# Patient Record
Sex: Male | Born: 1975 | Race: Black or African American | Hispanic: No | Marital: Single | Smoking: Former smoker
Health system: Southern US, Community
[De-identification: ages and names within clinical notes are randomized; demographics above are authoritative.]

## PROBLEM LIST (undated history)

## (undated) DIAGNOSIS — K219 Gastro-esophageal reflux disease without esophagitis: Secondary | ICD-10-CM

## (undated) DIAGNOSIS — I10 Essential (primary) hypertension: Secondary | ICD-10-CM

## (undated) DIAGNOSIS — E1300 Other specified diabetes mellitus with hyperosmolarity without nonketotic hyperglycemic-hyperosmolar coma (NKHHC): Secondary | ICD-10-CM

## (undated) DIAGNOSIS — E1110 Type 2 diabetes mellitus with ketoacidosis without coma: Secondary | ICD-10-CM

## (undated) DIAGNOSIS — E785 Hyperlipidemia, unspecified: Secondary | ICD-10-CM

## (undated) DIAGNOSIS — E119 Type 2 diabetes mellitus without complications: Secondary | ICD-10-CM

## (undated) DIAGNOSIS — L0291 Cutaneous abscess, unspecified: Secondary | ICD-10-CM

## (undated) DIAGNOSIS — A4902 Methicillin resistant Staphylococcus aureus infection, unspecified site: Secondary | ICD-10-CM

## (undated) DIAGNOSIS — E1100 Type 2 diabetes mellitus with hyperosmolarity without nonketotic hyperglycemic-hyperosmolar coma (NKHHC): Secondary | ICD-10-CM

## (undated) HISTORY — DX: Type 2 diabetes mellitus without complications: E11.9

## (undated) HISTORY — DX: Hyperlipidemia, unspecified: E78.5

## (undated) HISTORY — DX: Other specified diabetes mellitus with hyperosmolarity without nonketotic hyperglycemic-hyperosmolar coma (NKHHC): E13.00

## (undated) HISTORY — PX: ANKLE SURGERY: SHX546

## (undated) HISTORY — DX: Type 2 diabetes mellitus with ketoacidosis without coma: E11.10

## (undated) HISTORY — PX: INCISION AND DRAINAGE PERIRECTAL ABSCESS: SHX1804

## (undated) HISTORY — PX: HAND SURGERY: SHX662

## (undated) HISTORY — DX: Gastro-esophageal reflux disease without esophagitis: K21.9

## (undated) HISTORY — DX: Type 2 diabetes mellitus with hyperosmolarity without nonketotic hyperglycemic-hyperosmolar coma (NKHHC): E11.00

---

## 2000-04-03 ENCOUNTER — Encounter: Payer: Self-pay | Admitting: Orthopedic Surgery

## 2000-04-03 ENCOUNTER — Emergency Department (HOSPITAL_COMMUNITY): Admission: EM | Admit: 2000-04-03 | Discharge: 2000-04-03 | Payer: Self-pay

## 2000-04-03 ENCOUNTER — Encounter: Payer: Self-pay | Admitting: Emergency Medicine

## 2001-03-08 ENCOUNTER — Emergency Department (HOSPITAL_COMMUNITY): Admission: EM | Admit: 2001-03-08 | Discharge: 2001-03-08 | Payer: Self-pay | Admitting: Podiatry

## 2002-02-02 ENCOUNTER — Encounter: Payer: Self-pay | Admitting: Emergency Medicine

## 2002-02-02 ENCOUNTER — Emergency Department (HOSPITAL_COMMUNITY): Admission: EM | Admit: 2002-02-02 | Discharge: 2002-02-02 | Payer: Self-pay | Admitting: Emergency Medicine

## 2002-06-02 ENCOUNTER — Emergency Department (HOSPITAL_COMMUNITY): Admission: EM | Admit: 2002-06-02 | Discharge: 2002-06-02 | Payer: Self-pay | Admitting: Emergency Medicine

## 2003-03-12 ENCOUNTER — Emergency Department (HOSPITAL_COMMUNITY): Admission: EM | Admit: 2003-03-12 | Discharge: 2003-03-12 | Payer: Self-pay | Admitting: *Deleted

## 2003-03-12 ENCOUNTER — Ambulatory Visit (HOSPITAL_BASED_OUTPATIENT_CLINIC_OR_DEPARTMENT_OTHER): Admission: RE | Admit: 2003-03-12 | Discharge: 2003-03-12 | Payer: Self-pay | Admitting: General Surgery

## 2003-05-01 ENCOUNTER — Emergency Department (HOSPITAL_COMMUNITY): Admission: EM | Admit: 2003-05-01 | Discharge: 2003-05-01 | Payer: Self-pay | Admitting: Emergency Medicine

## 2003-05-12 ENCOUNTER — Emergency Department (HOSPITAL_COMMUNITY): Admission: EM | Admit: 2003-05-12 | Discharge: 2003-05-12 | Payer: Self-pay | Admitting: Emergency Medicine

## 2003-05-14 ENCOUNTER — Encounter: Admission: RE | Admit: 2003-05-14 | Discharge: 2003-05-14 | Payer: Self-pay | Admitting: Internal Medicine

## 2003-05-26 ENCOUNTER — Emergency Department (HOSPITAL_COMMUNITY): Admission: EM | Admit: 2003-05-26 | Discharge: 2003-05-26 | Payer: Self-pay | Admitting: Emergency Medicine

## 2003-08-21 ENCOUNTER — Emergency Department (HOSPITAL_COMMUNITY): Admission: AD | Admit: 2003-08-21 | Discharge: 2003-08-21 | Payer: Self-pay | Admitting: Family Medicine

## 2003-08-24 ENCOUNTER — Encounter: Admission: RE | Admit: 2003-08-24 | Discharge: 2003-08-24 | Payer: Self-pay | Admitting: Internal Medicine

## 2003-11-23 ENCOUNTER — Emergency Department (HOSPITAL_COMMUNITY): Admission: EM | Admit: 2003-11-23 | Discharge: 2003-11-24 | Payer: Self-pay | Admitting: Emergency Medicine

## 2003-12-17 ENCOUNTER — Emergency Department (HOSPITAL_COMMUNITY): Admission: EM | Admit: 2003-12-17 | Discharge: 2003-12-17 | Payer: Self-pay | Admitting: Emergency Medicine

## 2004-03-07 ENCOUNTER — Emergency Department (HOSPITAL_COMMUNITY): Admission: EM | Admit: 2004-03-07 | Discharge: 2004-03-07 | Payer: Self-pay | Admitting: Emergency Medicine

## 2004-03-14 ENCOUNTER — Emergency Department (HOSPITAL_COMMUNITY): Admission: EM | Admit: 2004-03-14 | Discharge: 2004-03-14 | Payer: Self-pay | Admitting: *Deleted

## 2005-11-24 ENCOUNTER — Emergency Department (HOSPITAL_COMMUNITY): Admission: EM | Admit: 2005-11-24 | Discharge: 2005-11-24 | Payer: Self-pay | Admitting: Emergency Medicine

## 2006-09-20 ENCOUNTER — Emergency Department (HOSPITAL_COMMUNITY): Admission: EM | Admit: 2006-09-20 | Discharge: 2006-09-20 | Payer: Self-pay | Admitting: Emergency Medicine

## 2007-12-01 ENCOUNTER — Emergency Department (HOSPITAL_COMMUNITY): Admission: EM | Admit: 2007-12-01 | Discharge: 2007-12-01 | Payer: Self-pay | Admitting: Emergency Medicine

## 2009-04-22 ENCOUNTER — Emergency Department (HOSPITAL_COMMUNITY): Admission: EM | Admit: 2009-04-22 | Discharge: 2009-04-22 | Payer: Self-pay | Admitting: Family Medicine

## 2009-05-14 ENCOUNTER — Encounter: Admission: RE | Admit: 2009-05-14 | Discharge: 2009-05-21 | Payer: Self-pay | Admitting: Orthopedic Surgery

## 2009-06-06 ENCOUNTER — Emergency Department (HOSPITAL_COMMUNITY): Admission: EM | Admit: 2009-06-06 | Discharge: 2009-06-07 | Payer: Self-pay | Admitting: Emergency Medicine

## 2010-07-27 LAB — COMPREHENSIVE METABOLIC PANEL
ALT: 72 U/L — ABNORMAL HIGH (ref 0–53)
Alkaline Phosphatase: 90 U/L (ref 39–117)
BUN: 11 mg/dL (ref 6–23)
CO2: 26 mEq/L (ref 19–32)
Chloride: 103 mEq/L (ref 96–112)
GFR calc non Af Amer: >60 mL/min (ref >60)
Glucose, Bld: 116 mg/dL — ABNORMAL HIGH (ref 70–99)
Potassium: 4 mEq/L (ref 3.5–5.1)
Sodium: 138 mEq/L (ref 135–145)
Total Bilirubin: 1.4 mg/dL — ABNORMAL HIGH (ref 0.3–1.2)
Total Protein: 6.8 g/dL (ref 6.0–8.3)

## 2010-07-27 LAB — CBC
Hemoglobin: 16 g/dL (ref 13.0–17.0)
MCHC: 34 g/dL (ref 30.0–36.0)
RBC: 5.24 MIL/uL (ref 4.22–5.81)
WBC: 7.9 10*3/uL (ref 4.0–10.5)

## 2010-07-27 LAB — URINALYSIS, ROUTINE W REFLEX MICROSCOPIC
Bilirubin Urine: NEGATIVE
Nitrite: NEGATIVE
Specific Gravity, Urine: 1.029 (ref 1.005–1.030)
Urobilinogen, UA: 1 mg/dL (ref 0.0–1.0)
pH: 6 (ref 5.0–8.0)

## 2010-07-27 LAB — DIFFERENTIAL
Basophils Relative: 1 % (ref 0–1)
Eosinophils Absolute: 0.3 10*3/uL (ref 0.0–0.7)
Eosinophils Relative: 3 % (ref 0–5)
Lymphs Abs: 2.2 10*3/uL (ref 0.7–4.0)
Neutrophils Relative %: 60 % (ref 43–77)

## 2010-07-27 LAB — LIPASE, BLOOD: Lipase: 21 U/L (ref 11–59)

## 2010-07-29 ENCOUNTER — Inpatient Hospital Stay (INDEPENDENT_AMBULATORY_CARE_PROVIDER_SITE_OTHER)
Admission: RE | Admit: 2010-07-29 | Discharge: 2010-07-29 | Disposition: A | Discharge status: Home / Self Care | Payer: Self-pay | Source: Ambulatory Visit | Attending: Family Medicine | Admitting: Family Medicine

## 2010-07-29 DIAGNOSIS — M79609 Pain in unspecified limb: Secondary | ICD-10-CM

## 2010-07-29 DIAGNOSIS — S8000XA Contusion of unspecified knee, initial encounter: Secondary | ICD-10-CM

## 2010-07-29 DIAGNOSIS — M25469 Effusion, unspecified knee: Secondary | ICD-10-CM

## 2010-08-12 ENCOUNTER — Inpatient Hospital Stay (INDEPENDENT_AMBULATORY_CARE_PROVIDER_SITE_OTHER)
Admission: RE | Admit: 2010-08-12 | Discharge: 2010-08-12 | Disposition: A | Discharge status: Home / Self Care | Payer: Self-pay | Source: Ambulatory Visit

## 2010-08-12 DIAGNOSIS — R6889 Other general symptoms and signs: Secondary | ICD-10-CM

## 2010-09-26 NOTE — Consult Note (Signed)
Raymond Malone, Raymond Malone                          ACCOUNT NO.:  [id number]   MEDICAL RECORD NO.:  [id number]                   PATIENT TYPE:  EMS   LOCATION:  ED                                   FACILITY:  [facility]   PHYSICIAN:  Lorre Munroe., M.D.            DATE OF BIRTH:  June 17, 1975   DATE OF CONSULTATION:  05/12/2003  DATE OF DISCHARGE:                                   CONSULTATION   CHIEF COMPLAINT:  Anal pain.   HISTORY OF PRESENT ILLNESS:  The patient is a healthy 35 year old black male  with no history if GI disease but who underwent an incision and drainage of  perirectal abscess by Adolph Pollack, M.D. in November.  He has gotten  recurrent symptoms with severe anal pain and tenderness.  He has not felt  like he had a fever or chills and he does not note any drainage.  No  diarrhea, constipation or other generalized symptoms.   PAST MEDICAL HISTORY:  Excellent health.  No major operations.  Denies heart  and lung problems and other serious chronic problems.  Denies allergies.  He  asked for care of the perianal problem.   PHYSICAL EXAMINATION:  ABDOMEN:  Unremarkable.  BUTTOCKS:  The left buttock is swollen and tender with fluctuation at the  site of a scar.   IMPRESSION:  Perirectal abscess.   PROCEDURE:  Under a morphine analgesia and sedation and after a thorough  infiltration of local anesthetic in the skin, subcutaneous tissues, and  within the abscess cavity I did a radial incision and drainage with a great  deal of pus obtained.  Probing of the cavity disclosed no evidence of what  is likely to be a horseshoe fistula.  The cavity is packed after hemostasis  is obtained.  The patient is asked to return to the office in about 10 days  for a follow up check.  He is to change his bandage as necessary and keep a  bandage on it as long as there is any drainage or pus.                                               Lorre Munroe., M.D.    Jodi Marble  D:   05/12/2003  T:  05/12/2003  Job:  [id number]

## 2010-09-26 NOTE — Op Note (Signed)
   Raymond Malone, Raymond Malone                          ACCOUNT NO.:  [id number]   MEDICAL RECORD NO.:  [id number]                   PATIENT TYPE:  AMB   LOCATION:  DSC                                  FACILITY:  [facility]   PHYSICIAN:  Adolph Pollack, M.D.            DATE OF BIRTH:  1976-04-11   DATE OF PROCEDURE:  03/12/2003  DATE OF DISCHARGE:                                 OPERATIVE REPORT   PREOPERATIVE DIAGNOSIS:  Anorectal abscess.   POSTOPERATIVE DIAGNOSIS:  Anorectal abscess.   PROCEDURES:  Complex incision and drainage of anorectal abscess.   SURGEON:  Adolph Pollack, M.D.   ANESTHESIA:  General.   INDICATIONS:  Mr. Mccalla is a 35 year old male with progressively increasing  left perianal swelling and pain.  He presented to the emergency department  last night and was given a shot of ceftriaxone.  I saw him in the office  this morning.  He is now brought to [facility] for incision and  drainage.   TECHNIQUE:  He was seen in the holding area and then brought to the  operating room.  Placed supine on the operating table.  A general anesthetic  was administered.  He was placed in the lithotomy position.  The perianal  area was sterilely prepped and draped.  A fluctuant area was identified.  A  full-thickness triangular plug of skin and subcutaneous tissue was removed  with purulent material evacuated.  There were pockets tracking posteriorly  into the buttock region which were broken up and the pus drained out.  Once  adequate drainage had been performed, a 1/4 inch Penrose drain was stuck in  the cavity and angled to the subcutaneous tissue with 3-0 chromic suture.  I  then held direct pressure for hemostatic effects.  A digital rectal exam was  performed and no obvious fistulous track was noted.   A bulky dressing was then applied to the wound followed by tape.  He  tolerated the procedure well without any apparent complications and was  taken to the recovery room  in satisfactory condition.  He will be given  postoperative instructions.  He will also be placed on an antibiotic with  some surrounding cellulitis of the area.  Will see him back in the office in  two weeks.                                               Adolph Pollack, M.D.    Kari Baars  D:  03/12/2003  T:  03/12/2003  Job:  [id number]
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
## 2011-02-06 LAB — GLUCOSE, CAPILLARY: Glucose-Capillary: 160 — ABNORMAL HIGH

## 2011-05-19 ENCOUNTER — Emergency Department (INDEPENDENT_AMBULATORY_CARE_PROVIDER_SITE_OTHER)
Admission: EM | Admit: 2011-05-19 | Discharge: 2011-05-19 | Disposition: A | Discharge status: Home / Self Care | Payer: Self-pay | Source: Home / Self Care | Attending: Family Medicine | Admitting: Family Medicine

## 2011-05-19 ENCOUNTER — Encounter: Payer: Self-pay | Admitting: *Deleted

## 2011-05-19 DIAGNOSIS — H5789 Other specified disorders of eye and adnexa: Secondary | ICD-10-CM

## 2011-05-19 HISTORY — DX: Methicillin resistant Staphylococcus aureus infection, unspecified site: A49.02

## 2011-05-19 MED ORDER — TETRACAINE HCL 0.5 % OP SOLN
OPHTHALMIC | Status: AC
  Filled 2011-05-19: qty 2

## 2011-05-19 MED ORDER — PREDNISOLONE ACETATE 1 % OP SUSP: 1.0000 [drp] | Freq: Four times a day (QID) | OPHTHALMIC | Status: DC

## 2011-05-19 MED ORDER — SCOPOLAMINE HBR 0.25 % OP SOLN: 1.0000 [drp] | Freq: Two times a day (BID) | OPHTHALMIC | Status: AC | PRN

## 2011-05-19 MED ORDER — ERYTHROMYCIN 5 MG/GM OP OINT: TOPICAL_OINTMENT | OPHTHALMIC | Status: AC

## 2011-05-19 MED ORDER — SCOPOLAMINE HBR 0.25 % OP SOLN: 1.0000 [drp] | Freq: Four times a day (QID) | OPHTHALMIC | Status: DC

## 2011-05-19 NOTE — ED Provider Notes (Signed)
History     CSN: [id number]  Arrival date & time 05/19/11  1702   First MD Initiated Contact with Patient 05/19/11 1722      Chief Complaint  Patient presents with  . Conjunctivitis  . Ear Fullness    (Consider location/radiation/quality/duration/timing/severity/associated sxs/prior treatment) HPI Comments: 36 y/o male smoker otherwise with no significant PMH here c/o severe left eye discomfort first noticed 2 days ago. No contact lenses, no known trauma and no history of foreign body. Light bothers him the most. Feels better when room dark severe pain when light is turned on. Has blurry vision and redness in left eye, no itchiness, no drainage no sand like sensation. No fever or general malaise, no dysuria or joint pain.   Past Medical History  Diagnosis Date  . MRSA (methicillin resistant Staphylococcus aureus)     Past Surgical History  Procedure Date  . Ankle surgery   . Incision and drainage perirectal abscess     No family history on file.  History  Substance Use Topics  . Smoking status: Current Everyday Smoker -- 1.0 packs/day  . Smokeless tobacco: Not on file  . Alcohol Use: Yes     Occasional      Review of Systems  Constitutional: Negative for fever and chills.  HENT: Negative for congestion and rhinorrhea.   Eyes: Positive for photophobia, redness and visual disturbance.  Respiratory: Negative for cough.   Genitourinary: Negative for dysuria.  Musculoskeletal: Negative for joint swelling and arthralgias.  Neurological: Negative for headaches.    Allergies  Review of patient's allergies indicates no known allergies.  Home Medications   Current Outpatient Rx  Name Route Sig Dispense Refill  . ERYTHROMYCIN 5 MG/GM OP OINT  Place a 1/2 inch ribbon of ointment into the lower eyelid tid for 7 days 1 g 0  . PREDNISOLONE ACETATE 1 % OP SUSP Left Eye Place 1 drop into the left eye 4 (four) times daily. 5 mL 0  . SCOPOLAMINE HBR 0.25 % OP SOLN Left Eye  Place 1 drop into the left eye 4 (four) times daily. 5 mL 0    BP 156/100  Pulse 82  Temp(Src) 98.7 F (37.1 C) (Oral)  Resp 18  SpO2 99%  Physical Exam  Nursing note and vitals reviewed. Constitutional: He is oriented to person, place, and time. He appears well-developed and well-nourished.       Uncomfortable with eye closed.  HENT:  Head: Normocephalic and atraumatic.  Right Ear: External ear normal.  Left Ear: External ear normal.  Nose: Nose normal.  Mouth/Throat: Oropharynx is clear and moist. No oropharyngeal exudate.  Eyes: EOM are normal. Pupils are equal, round, and reactive to light. Right eye exhibits no discharge. Left eye exhibits no discharge. No scleral icterus.       Left eye moderate to severe conjunctival injection no significant pericilliar injection. Small pupil is round and reactive to light and appear symmetric compared with right side. Severe pain with pupil constriction in response to light or accomodation. No drainage or exudates, no blepharitis.  No periorbital edema or erythema. No fluorescein take no obvious foreign body or corneal abrasions or laceration.  Neck: Neck supple.  Cardiovascular: Normal heart sounds.   Pulmonary/Chest: Breath sounds normal.  Lymphadenopathy:    He has no cervical adenopathy.  Neurological: He is alert and oriented to person, place, and time.  Skin: No rash noted.    ED Course  Procedures (including critical care time)  Labs Reviewed -  No data to display No results found.   1. Eye redness       MDM  Pt. States he could not see an eye doctor as he does not have insurance or money. I discussed case with Dr. Burnice Logan (eye specialist on call) over the phone. He is willing to see patient in am no charge upfront. Scopolamine and erythromycin prescribed until recheck.       2  Sharin Grave, MD 05/20/11 1326

## 2011-05-19 NOTE — ED Notes (Signed)
On Sunday, had some photophobia.  Yesterday morning woke up with severe irritation and continuation of photophobia.  Denies any crusting or drainage.  Has used Clear Eyes gtts without relief.  Denies injury.  C/O blurred vision in left eye.  Does not wear glasses or contact lenses.  Also c/o "fullness" in right ear.

## 2012-04-13 ENCOUNTER — Emergency Department (INDEPENDENT_AMBULATORY_CARE_PROVIDER_SITE_OTHER)
Admission: EM | Admit: 2012-04-13 | Discharge: 2012-04-13 | Disposition: A | Discharge status: Home / Self Care | Payer: Self-pay | Source: Home / Self Care | Attending: Family Medicine | Admitting: Family Medicine

## 2012-04-13 ENCOUNTER — Encounter (HOSPITAL_COMMUNITY): Payer: Self-pay | Admitting: *Deleted

## 2012-04-13 DIAGNOSIS — M5430 Sciatica, unspecified side: Secondary | ICD-10-CM

## 2012-04-13 DIAGNOSIS — M5431 Sciatica, right side: Secondary | ICD-10-CM

## 2012-04-13 MED ORDER — OXYCODONE-ACETAMINOPHEN 5-325 MG PO TABS: 2.0000 | ORAL_TABLET | ORAL | Status: DC | PRN

## 2012-04-13 MED ORDER — IBUPROFEN 800 MG PO TABS: 800.0000 mg | ORAL_TABLET | Freq: Three times a day (TID) | ORAL | Status: DC

## 2012-04-13 MED ORDER — KETOROLAC TROMETHAMINE 60 MG/2ML IM SOLN
INTRAMUSCULAR | Status: AC
  Filled 2012-04-13: qty 2

## 2012-04-13 MED ORDER — KETOROLAC TROMETHAMINE 60 MG/2ML IM SOLN
60.0000 mg | Freq: Once | INTRAMUSCULAR | Status: AC
  Administered 2012-04-13: 60 mg via INTRAMUSCULAR

## 2012-04-13 MED ORDER — PREDNISONE 10 MG PO TABS: ORAL_TABLET | ORAL | Status: DC

## 2012-04-13 NOTE — ED Notes (Signed)
C/o severe lower back pain onset last Thursday.  No known injury.  States he lifted a TV on Wed. Night but no pain at that time.  Pain radiates down R leg to mid thigh.  Occasional tingling in his R foot.
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
## 2012-04-16 NOTE — ED Provider Notes (Signed)
Medical screening examination/treatment/procedure(s) were performed by resident physician or non-physician practitioner and as supervising physician I was immediately available for consultation/collaboration.   Arley Garant DOUGLAS MD.    Jaccob Czaplicki D Lawrence Mitch, MD 04/16/12 1931 

## 2012-10-19 ENCOUNTER — Emergency Department (HOSPITAL_COMMUNITY): Payer: Self-pay

## 2012-10-19 ENCOUNTER — Emergency Department (HOSPITAL_COMMUNITY)
Admission: EM | Admit: 2012-10-19 | Discharge: 2012-10-20 | Disposition: A | Discharge status: Home / Self Care | Payer: Self-pay | Attending: Emergency Medicine | Admitting: Emergency Medicine

## 2012-10-19 ENCOUNTER — Encounter (HOSPITAL_COMMUNITY): Payer: Self-pay | Admitting: *Deleted

## 2012-10-19 DIAGNOSIS — S61509A Unspecified open wound of unspecified wrist, initial encounter: Secondary | ICD-10-CM | POA: Insufficient documentation

## 2012-10-19 DIAGNOSIS — Y9229 Other specified public building as the place of occurrence of the external cause: Secondary | ICD-10-CM | POA: Insufficient documentation

## 2012-10-19 DIAGNOSIS — F17200 Nicotine dependence, unspecified, uncomplicated: Secondary | ICD-10-CM | POA: Insufficient documentation

## 2012-10-19 DIAGNOSIS — W260XXA Contact with knife, initial encounter: Secondary | ICD-10-CM | POA: Insufficient documentation

## 2012-10-19 DIAGNOSIS — S61512A Laceration without foreign body of left wrist, initial encounter: Secondary | ICD-10-CM

## 2012-10-19 DIAGNOSIS — Y9389 Activity, other specified: Secondary | ICD-10-CM | POA: Insufficient documentation

## 2012-10-19 DIAGNOSIS — Z8614 Personal history of Methicillin resistant Staphylococcus aureus infection: Secondary | ICD-10-CM | POA: Insufficient documentation

## 2012-10-19 DIAGNOSIS — Z23 Encounter for immunization: Secondary | ICD-10-CM | POA: Insufficient documentation

## 2012-10-19 DIAGNOSIS — M25532 Pain in left wrist: Secondary | ICD-10-CM

## 2012-10-19 MED ORDER — HYDROCODONE-ACETAMINOPHEN 5-325 MG PO TABS
2.0000 | ORAL_TABLET | Freq: Once | ORAL | Status: AC
  Filled 2012-10-19: qty 1

## 2012-10-19 MED ORDER — CEPHALEXIN 500 MG PO CAPS: 500.0000 mg | ORAL_CAPSULE | Freq: Four times a day (QID) | ORAL | Status: DC

## 2012-10-19 MED ORDER — TETANUS-DIPHTH-ACELL PERTUSSIS 5-2.5-18.5 LF-MCG/0.5 IM SUSP
0.5000 mL | Freq: Once | INTRAMUSCULAR | Status: AC
  Administered 2012-10-19: 0.5 mL via INTRAMUSCULAR
  Filled 2012-10-19: qty 0.5

## 2012-10-19 MED ORDER — HYDROCODONE-ACETAMINOPHEN 5-325 MG PO TABS: 1.0000 | ORAL_TABLET | ORAL | Status: DC | PRN

## 2012-10-19 MED ORDER — HYDROCODONE-ACETAMINOPHEN 5-325 MG PO TABS
2.0000 | ORAL_TABLET | Freq: Once | ORAL | Status: AC
  Administered 2012-10-19: 2 via ORAL
  Filled 2012-10-19: qty 2
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
## 2012-10-19 NOTE — ED Provider Notes (Signed)
  Medical screening examination/treatment/procedure(s) were performed by non-physician practitioner and as supervising physician I was immediately available for consultation/collaboration.    Ayub Kirsh, MD 10/19/12 2353 

## 2012-10-19 NOTE — Progress Notes (Signed)
Orthopedic Tech Progress Note Patient Details:  Raymond Malone 1976/03/19 [id number]  Ortho Devices Type of Ortho Device: Velcro wrist splint   Haskell Flirt 10/19/2012, 11:42 PM

## 2012-10-19 NOTE — ED Notes (Signed)
Pt requesting food and drink. Pt told unable to have anything PO until seen and allowed by provider.
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
## 2012-11-26 ENCOUNTER — Encounter (HOSPITAL_COMMUNITY): Payer: Self-pay | Admitting: *Deleted

## 2012-11-26 ENCOUNTER — Emergency Department (HOSPITAL_COMMUNITY)
Admission: EM | Admit: 2012-11-26 | Discharge: 2012-11-26 | Disposition: A | Discharge status: Home / Self Care | Payer: Self-pay | Attending: Emergency Medicine | Admitting: Emergency Medicine

## 2012-11-26 ENCOUNTER — Emergency Department (HOSPITAL_COMMUNITY): Payer: Self-pay

## 2012-11-26 DIAGNOSIS — Y929 Unspecified place or not applicable: Secondary | ICD-10-CM | POA: Insufficient documentation

## 2012-11-26 DIAGNOSIS — S61209A Unspecified open wound of unspecified finger without damage to nail, initial encounter: Secondary | ICD-10-CM | POA: Insufficient documentation

## 2012-11-26 DIAGNOSIS — Z8614 Personal history of Methicillin resistant Staphylococcus aureus infection: Secondary | ICD-10-CM | POA: Insufficient documentation

## 2012-11-26 DIAGNOSIS — W261XXA Contact with sword or dagger, initial encounter: Secondary | ICD-10-CM | POA: Insufficient documentation

## 2012-11-26 DIAGNOSIS — IMO0002 Reserved for concepts with insufficient information to code with codable children: Secondary | ICD-10-CM

## 2012-11-26 DIAGNOSIS — Y9389 Activity, other specified: Secondary | ICD-10-CM | POA: Insufficient documentation

## 2012-11-26 DIAGNOSIS — F17200 Nicotine dependence, unspecified, uncomplicated: Secondary | ICD-10-CM | POA: Insufficient documentation

## 2012-11-26 DIAGNOSIS — Z9889 Other specified postprocedural states: Secondary | ICD-10-CM | POA: Insufficient documentation

## 2012-11-26 DIAGNOSIS — W260XXA Contact with knife, initial encounter: Secondary | ICD-10-CM | POA: Insufficient documentation

## 2012-11-26 LAB — CBC WITH DIFFERENTIAL/PLATELET
Basophils Relative: 0 % (ref 0–1)
Eosinophils Absolute: 0.1 10*3/uL (ref 0.0–0.7)
Eosinophils Relative: 1 % (ref 0–5)
Lymphs Abs: 2.4 10*3/uL (ref 0.7–4.0)
MCH: 28.6 pg (ref 26.0–34.0)
MCHC: 34.3 g/dL (ref 30.0–36.0)
MCV: 83.4 fL (ref 78.0–100.0)
Monocytes Relative: 8 % (ref 3–12)
Platelets: 242 10*3/uL (ref 150–400)
RBC: 4.93 MIL/uL (ref 4.22–5.81)

## 2012-11-26 LAB — POCT I-STAT, CHEM 8
Creatinine, Ser: 1 mg/dL (ref 0.50–1.35)
HCT: 44 % (ref 39.0–52.0)
Hemoglobin: 15 g/dL (ref 13.0–17.0)
Potassium: 3.8 mEq/L (ref 3.5–5.1)
Sodium: 140 mEq/L (ref 135–145)

## 2012-11-26 MED ORDER — LIDOCAINE-EPINEPHRINE 2 %-1:100000 IJ SOLN
20.0000 mL | Freq: Once | INTRAMUSCULAR | Status: AC
  Administered 2012-11-26: 20 mL
  Filled 2012-11-26: qty 20

## 2012-11-26 MED ORDER — DEXTROSE 5 % IV SOLN
1.0000 g | Freq: Once | INTRAVENOUS | Status: DC
  Filled 2012-11-26: qty 10

## 2012-11-26 MED ORDER — BACITRACIN ZINC 500 UNIT/GM EX OINT
TOPICAL_OINTMENT | Freq: Once | CUTANEOUS | Status: AC
  Administered 2012-11-26: 11:00:00 via TOPICAL
  Filled 2012-11-26: qty 15

## 2012-11-26 MED ORDER — LIDOCAINE-EPINEPHRINE 1 %-1:100000 IJ SOLN: 10.0000 mL | Freq: Once | INTRAMUSCULAR | Status: DC

## 2012-11-26 MED ORDER — DEXTROSE 5 % IV SOLN
1.0000 g | Freq: Once | INTRAVENOUS | Status: AC
  Administered 2012-11-26: 1 g via INTRAVENOUS

## 2012-11-26 MED ORDER — CEPHALEXIN 500 MG PO CAPS: 500.0000 mg | ORAL_CAPSULE | Freq: Two times a day (BID) | ORAL | Status: DC

## 2012-11-26 MED ORDER — CEFTRIAXONE SODIUM 1 G IJ SOLR: 1.0000 g | Freq: Once | INTRAMUSCULAR | Status: DC

## 2012-11-26 MED ORDER — OXYCODONE-ACETAMINOPHEN 5-325 MG PO TABS
2.0000 | ORAL_TABLET | Freq: Once | ORAL | Status: AC
  Administered 2012-11-26: 2 via ORAL
  Filled 2012-11-26: qty 2

## 2012-11-26 MED ORDER — OXYCODONE-ACETAMINOPHEN 5-325 MG PO TABS: 1.0000 | ORAL_TABLET | Freq: Three times a day (TID) | ORAL | Status: DC | PRN
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
## 2012-11-26 NOTE — ED Notes (Signed)
MD at bedside. 

## 2012-11-26 NOTE — Progress Notes (Signed)
Spoke with Patient at bedside.Case manager role explained.Patient provided with education on the [employer] four dollar plan for his Antibiotic medication.Patient reports he does have the funds  To pay for this plan.Patient does not have a PCP.Patient provided with a resource sheet for the [facility] [place] [facility] on [address].Patient verbalized full understanding of verbal /written Education-resources.No further case manager needs.
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
## 2012-11-26 NOTE — ED Notes (Signed)
Dressing removed by Dr. Mora Bellman.
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
## 2012-11-26 NOTE — ED Notes (Signed)
Pt requesting to speak to Child psychotherapist. Delice Bison- Child psychotherapist made aware sts will speak to pt.

## 2012-11-26 NOTE — ED Notes (Signed)
Pt sts wants social worker consult because unable to pay for medications. Dr. Mora Bellman made aware. sts will speak to pt.

## 2012-11-26 NOTE — ED Notes (Signed)
Dr Miller at bedside. 

## 2012-12-03 ENCOUNTER — Encounter (HOSPITAL_COMMUNITY): Payer: Self-pay | Admitting: *Deleted

## 2012-12-03 ENCOUNTER — Emergency Department (HOSPITAL_COMMUNITY)
Admission: EM | Admit: 2012-12-03 | Discharge: 2012-12-03 | Disposition: A | Discharge status: Home / Self Care | Payer: Self-pay | Attending: Emergency Medicine | Admitting: Emergency Medicine

## 2012-12-03 DIAGNOSIS — Z8614 Personal history of Methicillin resistant Staphylococcus aureus infection: Secondary | ICD-10-CM | POA: Insufficient documentation

## 2012-12-03 DIAGNOSIS — Z4802 Encounter for removal of sutures: Secondary | ICD-10-CM | POA: Insufficient documentation

## 2012-12-03 DIAGNOSIS — F17200 Nicotine dependence, unspecified, uncomplicated: Secondary | ICD-10-CM | POA: Insufficient documentation

## 2012-12-03 DIAGNOSIS — Z792 Long term (current) use of antibiotics: Secondary | ICD-10-CM | POA: Insufficient documentation
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
## 2012-12-03 NOTE — ED Notes (Signed)
Here for suture removal to left middle finger, no complaints. No signs of infection.

## 2012-12-03 NOTE — ED Notes (Signed)
Removed 11 sutures from left hand

## 2012-12-03 NOTE — ED Provider Notes (Signed)
Medical screening examination/treatment/procedure(s) were performed by non-physician practitioner and as supervising physician I was immediately available for consultation/collaboration.   Colleen Donahoe, MD 12/03/12 1459 

## 2013-06-21 ENCOUNTER — Emergency Department (HOSPITAL_COMMUNITY)
Admission: EM | Admit: 2013-06-21 | Discharge: 2013-06-21 | Disposition: A | Discharge status: Home / Self Care | Payer: Self-pay | Attending: Emergency Medicine | Admitting: Emergency Medicine

## 2013-06-21 ENCOUNTER — Encounter (HOSPITAL_COMMUNITY): Payer: Self-pay | Admitting: Emergency Medicine

## 2013-06-21 DIAGNOSIS — Z792 Long term (current) use of antibiotics: Secondary | ICD-10-CM | POA: Insufficient documentation

## 2013-06-21 DIAGNOSIS — L02219 Cutaneous abscess of trunk, unspecified: Secondary | ICD-10-CM | POA: Insufficient documentation

## 2013-06-21 DIAGNOSIS — L03319 Cellulitis of trunk, unspecified: Principal | ICD-10-CM

## 2013-06-21 DIAGNOSIS — L02214 Cutaneous abscess of groin: Secondary | ICD-10-CM

## 2013-06-21 DIAGNOSIS — F17200 Nicotine dependence, unspecified, uncomplicated: Secondary | ICD-10-CM | POA: Insufficient documentation

## 2013-06-21 DIAGNOSIS — Z8614 Personal history of Methicillin resistant Staphylococcus aureus infection: Secondary | ICD-10-CM | POA: Insufficient documentation

## 2013-06-21 MED ORDER — OXYCODONE-ACETAMINOPHEN 5-325 MG PO TABS
1.0000 | ORAL_TABLET | Freq: Once | ORAL | Status: AC
  Administered 2013-06-21: 1 via ORAL
  Filled 2013-06-21: qty 1

## 2013-06-21 NOTE — ED Notes (Signed)
Per patient pt has abscess to left groin, no drainage noted. Pt sts tender to touch and very painful. Pt has hx of abscess in various locations.

## 2013-06-21 NOTE — ED Provider Notes (Signed)
CSN: [id number]     Arrival date & time 06/21/13  1242 History  This chart was scribed for non-physician practitioner, Quincy Carnes, PA-C working with Alfonzo Feller, DO by Frederich Balding, ED scribe. This patient was seen in room TR11C/TR11C and the patient's care was started at 1:13 PM.   Chief Complaint  Patient presents with  . Abscess   The history is provided by the patient. No language interpreter was used.   HPI Comments: Raymond Malone is a 38 y.o. male who presents to the Emergency Department complaining of an abscess to his left groin area that he noticed 2 days ago. Denies drainage. He states it is very painful. Pt has a history of abscess under is arms, on his buttocks and on his face. Denies fever or chills.  Pt has hx of MRSA.  No intervention tried PTA.  Past Medical History  Diagnosis Date  . MRSA (methicillin resistant Staphylococcus aureus)    Past Surgical History  Procedure Laterality Date  . Ankle surgery    . Incision and drainage perirectal abscess    . Hand surgery      boxer's fracture L hand   No family history on file. History  Substance Use Topics  . Smoking status: Current Every Day Smoker -- 1.00 packs/day    Types: Cigarettes  . Smokeless tobacco: Not on file  . Alcohol Use: Yes     Comment: Occasional    Review of Systems  Constitutional: Negative for fever.  Skin:       Abscess.  All other systems reviewed and are negative.   Allergies  Review of patient's allergies indicates no known allergies.  Home Medications   Current Outpatient Rx  Name  Route  Sig  Dispense  Refill  . cephALEXin (KEFLEX) 500 MG capsule   Oral   Take 500 mg by mouth 2 (two) times daily.          BP 146/93  Pulse 83  Temp(Src) 97.7 F (36.5 C) (Oral)  Resp 19  Wt 230 lb (104.327 kg)  SpO2 95%  Physical Exam  Nursing note and vitals reviewed. Constitutional: He is oriented to person, place, and time. He appears well-developed and well-nourished.  No distress.  HENT:  Head: Normocephalic and atraumatic.  Mouth/Throat: Oropharynx is clear and moist.  Eyes: Conjunctivae and EOM are normal. Pupils are equal, round, and reactive to light.  Neck: Normal range of motion.  Cardiovascular: Normal rate, regular rhythm and normal heart sounds.   Pulmonary/Chest: Effort normal and breath sounds normal. No respiratory distress. He has no wheezes.  Genitourinary:  Small abscess to left groin. No active drainage.  Central fluctuance without surrounding erythema or evidence of cellulitis.   Musculoskeletal: Normal range of motion.  Neurological: He is alert and oriented to person, place, and time.  Skin: Skin is warm and dry. He is not diaphoretic.  Psychiatric: He has a normal mood and affect.    ED Course  Procedures (including critical care time)  DIAGNOSTIC STUDIES: Oxygen Saturation is 95% on RA, adequate by my interpretation.    COORDINATION OF CARE: 1:14 PM-Discussed treatment plan which includes I&D and pain medication with pt at bedside and pt agreed to plan.   INCISION AND DRAINAGE Performed by: Quincy Carnes, PA-C Consent: Verbal consent obtained. Risks and benefits: risks, benefits and alternatives were discussed Type: abscess  Body area: left groin  Anesthesia: local infiltration  Incision was made with a scalpel.  Local anesthetic: lidocaine  2% without epinephrine  Anesthetic total: 4 ml  Complexity: complex Blunt dissection to break up loculations  Drainage: purulent  Drainage amount: moderate  Packing material: none  Patient tolerance: Patient tolerated the procedure well with no immediate complications.   Labs Review Labs Reviewed - No data to display Imaging Review No results found.  EKG Interpretation   None       MDM   Final diagnoses:  Abscess of groin, left   I indeed performed as above, attempted to pack wound however pt states he could not tolerate any more manipulation of his  abscess. He is instructed to apply warm compresses to help aid drainage. He will followup with his primary care physician if problems occur. Discussed plan with patient, he agreed. Return precautions advised.  I personally performed the services described in this documentation, which was scribed in my presence. The recorded information has been reviewed and is accurate.  Larene Pickett, PA-C 06/21/13 [phone]

## 2013-06-21 NOTE — Discharge Instructions (Signed)
May apply warm compress to abscess to help aid continuing drainage. May take over the counter tylenol or motrin as needed for fever. Return to the ED for new or worsening sx.

## 2013-06-21 NOTE — ED Provider Notes (Signed)
Medical screening examination/treatment/procedure(s) were performed by non-physician practitioner and as supervising physician I was immediately available for consultation/collaboration.  EKG Interpretation   None         Alfonzo Feller, DO 06/21/13 1942

## 2014-03-28 ENCOUNTER — Encounter (HOSPITAL_COMMUNITY): Payer: Self-pay | Admitting: Emergency Medicine

## 2014-03-28 ENCOUNTER — Emergency Department (HOSPITAL_COMMUNITY)
Admission: EM | Admit: 2014-03-28 | Discharge: 2014-03-28 | Disposition: A | Discharge status: Home / Self Care | Payer: Medicaid Other | Attending: Emergency Medicine | Admitting: Emergency Medicine

## 2014-03-28 DIAGNOSIS — Z720 Tobacco use: Secondary | ICD-10-CM | POA: Insufficient documentation

## 2014-03-28 DIAGNOSIS — R21 Rash and other nonspecific skin eruption: Secondary | ICD-10-CM | POA: Insufficient documentation

## 2014-03-28 DIAGNOSIS — Z79899 Other long term (current) drug therapy: Secondary | ICD-10-CM | POA: Diagnosis not present

## 2014-03-28 DIAGNOSIS — Z8614 Personal history of Methicillin resistant Staphylococcus aureus infection: Secondary | ICD-10-CM | POA: Insufficient documentation

## 2014-03-28 DIAGNOSIS — Z792 Long term (current) use of antibiotics: Secondary | ICD-10-CM | POA: Insufficient documentation

## 2014-03-28 LAB — BASIC METABOLIC PANEL
ANION GAP: 9 (ref 5–15)
BUN: 9 mg/dL (ref 6–23)
CALCIUM: 9.2 mg/dL (ref 8.4–10.5)
CO2: 24 mEq/L (ref 19–32)
CREATININE: 1.01 mg/dL (ref 0.50–1.35)
Chloride: 105 mEq/L (ref 96–112)
Glucose, Bld: 107 mg/dL — ABNORMAL HIGH (ref 70–99)
Potassium: 4.8 mEq/L (ref 3.7–5.3)
SODIUM: 138 meq/L (ref 137–147)

## 2014-03-28 LAB — CBC WITH DIFFERENTIAL/PLATELET
BASOS ABS: 0.1 10*3/uL (ref 0.0–0.1)
BASOS PCT: 1 % (ref 0–1)
EOS ABS: 0.6 10*3/uL (ref 0.0–0.7)
EOS PCT: 7 % — AB (ref 0–5)
HEMATOCRIT: 46.8 % (ref 39.0–52.0)
Hemoglobin: 15.9 g/dL (ref 13.0–17.0)
LYMPHS PCT: 24 % (ref 12–46)
Lymphs Abs: 2.1 10*3/uL (ref 0.7–4.0)
MCH: 29.2 pg (ref 26.0–34.0)
MCHC: 34 g/dL (ref 30.0–36.0)
MCV: 86 fL (ref 78.0–100.0)
MONO ABS: 0.7 10*3/uL (ref 0.1–1.0)
Monocytes Relative: 8 % (ref 3–12)
Neutro Abs: 5.2 10*3/uL (ref 1.7–7.7)
Neutrophils Relative %: 60 % (ref 43–77)
PLATELETS: 322 10*3/uL (ref 150–400)
RBC: 5.44 MIL/uL (ref 4.22–5.81)
RDW: 13.1 % (ref 11.5–15.5)
WBC: 8.7 10*3/uL (ref 4.0–10.5)

## 2014-03-28 LAB — RPR

## 2014-03-28 LAB — HIV ANTIBODY (ROUTINE TESTING W REFLEX): HIV: NONREACTIVE

## 2014-03-28 MED ORDER — MUPIROCIN CALCIUM 2 % EX CREA
TOPICAL_CREAM | Freq: Once | CUTANEOUS | Status: AC
  Administered 2014-03-28: 14:00:00 via TOPICAL
  Filled 2014-03-28: qty 15

## 2014-03-28 MED ORDER — SULFAMETHOXAZOLE-TRIMETHOPRIM 800-160 MG PO TABS: 1.0000 | ORAL_TABLET | Freq: Two times a day (BID) | ORAL | Status: DC

## 2014-03-28 MED ORDER — HYDROCODONE-ACETAMINOPHEN 5-325 MG PO TABS
1.0000 | ORAL_TABLET | Freq: Once | ORAL | Status: AC
  Administered 2014-03-28: 1 via ORAL
  Filled 2014-03-28: qty 1

## 2014-03-28 MED ORDER — HYDROCODONE-ACETAMINOPHEN 5-325 MG PO TABS: ORAL_TABLET | ORAL | Status: DC

## 2014-03-28 MED ORDER — SULFAMETHOXAZOLE-TRIMETHOPRIM 800-160 MG PO TABS
1.0000 | ORAL_TABLET | Freq: Once | ORAL | Status: AC
  Administered 2014-03-28: 1 via ORAL
  Filled 2014-03-28: qty 1

## 2014-03-28 MED ORDER — MUPIROCIN 2 % EX OINT: 1.0000 "application " | TOPICAL_OINTMENT | Freq: Two times a day (BID) | CUTANEOUS | Status: DC

## 2014-03-28 NOTE — ED Provider Notes (Signed)
CSN: [id number]     Arrival date & time 03/28/14  1251 History  This chart was scribed for non-physician practitioner working with Francine Graven, DO by Molli Posey, ED Scribe. This patient was seen in room [facility] and the patient's care was started at 1:10 PM.   Chief Complaint  Patient presents with  . Rash   The history is provided by the patient. No language interpreter was used.   HPI Comments: Raymond Malone is a 38 y.o. male who presents to the Emergency Department complaining of a rash on his left hand and left foot that worsened 4 days ago. Pt reports associated pain to the areas. He denies any trauma or injury. Pt states he has been to ED multiple times in the past for similar symptoms for boils. He reports similar prior rash episodes but states that this episode is his worst. Pt states he took Abx several months ago for similar symptoms that reduced his rash. He reports using anti-fungal cream with no relief. He reports NKDA. He states his last STD screening was a long time ago.  Denies urethral discharge, fever, chills, nausea, vomiting, chest pain, shortness of breath.    Past Medical History  Diagnosis Date  . MRSA (methicillin resistant Staphylococcus aureus)    Past Surgical History  Procedure Laterality Date  . Ankle surgery    . Incision and drainage perirectal abscess    . Hand surgery      boxer's fracture L hand   No family history on file. History  Substance Use Topics  . Smoking status: Current Every Day Smoker -- 1.00 packs/day    Types: Cigarettes  . Smokeless tobacco: Not on file  . Alcohol Use: Yes     Comment: Occasional    Review of Systems 10 Systems reviewed and all are negative for acute change except as noted in the HPI.   Allergies  Review of patient's allergies indicates no known allergies.  Home Medications   Prior to Admission medications   Medication Sig Start Date End Date Taking? Authorizing Provider   HYDROcodone-acetaminophen (NORCO/VICODIN) 5-325 MG per tablet Take 1-2 tablets by mouth every 6 hours as needed for pain. 03/28/14   Doyal Saric, PA-C  metoprolol (LOPRESSOR) 50 MG tablet Take 50 mg by mouth 2 (two) times daily.    Historical Provider, MD  mupirocin ointment (BACTROBAN) 2 % Place 1 application into the nose 2 (two) times daily. 03/28/14   Yeva Bissette, PA-C  sulfamethoxazole-trimethoprim (BACTRIM DS) 800-160 MG per tablet Take 1 tablet by mouth 2 (two) times daily. 03/28/14   Lewayne Pauley, PA-C   BP 125/82 mmHg  Pulse 60  Temp(Src) 98.3 F (36.8 C) (Oral)  Resp 17  SpO2 98% Physical Exam  Constitutional: He is oriented to person, place, and time. He appears well-developed and well-nourished. No distress.  HENT:  Head: Normocephalic and atraumatic.  Mouth/Throat: Oropharynx is clear and moist.  Eyes: Conjunctivae and EOM are normal.  Neck: Normal range of motion.  Cardiovascular: Normal rate.   Pulmonary/Chest: Effort normal and breath sounds normal. No stridor.  Abdominal: Soft.  Musculoskeletal: Normal range of motion.  Neurological: He is alert and oriented to person, place, and time.  Skin: Rash noted.  Confluence ulcerated lesion is to bilateral feet and hands. No surrounding cellulitis or significant warmth. There is scant discharge. Lesions spare the mucous membranes   Psychiatric: He has a normal mood and affect.  Nursing note and vitals reviewed.  ED Course  Procedures  DIAGNOSTIC STUDIES: Oxygen Saturation is 99% on RA, normal by my interpretation.    COORDINATION OF CARE: 1:15 PM Discussed treatment plan with pt at bedside and pt agreed to plan.   Labs Review Labs Reviewed  CBC WITH DIFFERENTIAL - Abnormal; Notable for the following:    Eosinophils Relative 7 (*)    All other components within normal limits  BASIC METABOLIC PANEL - Abnormal; Notable for the following:    Glucose, Bld 107 (*)    All other  components within normal limits  GC/CHLAMYDIA PROBE AMP  RPR  HIV ANTIBODY (ROUTINE TESTING)    Imaging Review No results found.   EKG Interpretation None      MDM   Final diagnoses:  Rash   Filed Vitals:   03/28/14 1304 03/28/14 1455  BP: 123/87 125/82  Pulse: 62 60  Temp: 98.3 F (36.8 C)   TempSrc: Oral   Resp: 18 17  SpO2: 99% 98%    Medications  mupirocin cream (BACTROBAN) 2 % ( Topical Given 03/28/14 1428)  sulfamethoxazole-trimethoprim (BACTRIM DS,SEPTRA DS) 800-160 MG per tablet 1 tablet (1 tablet Oral Given 03/28/14 1400)  HYDROcodone-acetaminophen (NORCO/VICODIN) 5-325 MG per tablet 1 tablet (1 tablet Oral Given 03/28/14 1400)    Raymond Malone is a 38 y.o. male presenting with Painful ulcerated rash to all 4 extremities. No systemic signs of infection. Patient will be started on Bactrim out of an abundance of caution considering his history of MRSA. I've advised the patient on wound care and will write prescription for Bactroban advised close follow-up with dermatology.  Discussed case with attending MD who agrees with plan and stability to d/c to home.    Evaluation does not show pathology that would require ongoing emergent intervention or inpatient treatment. Pt is hemodynamically stable and mentating appropriately. Discussed findings and plan with patient/guardian, who agrees with care plan. All questions answered. Return precautions discussed and outpatient follow up given.   Discharge Medication List as of 03/28/2014  2:38 PM    START taking these medications   Details  HYDROcodone-acetaminophen (NORCO/VICODIN) 5-325 MG per tablet Take 1-2 tablets by mouth every 6 hours as needed for pain., Print    mupirocin ointment (BACTROBAN) 2 % Place 1 application into the nose 2 (two) times daily., Starting 03/28/2014, Until Discontinued, Print    sulfamethoxazole-trimethoprim (BACTRIM DS) 800-160 MG per tablet Take 1 tablet by mouth 2 (two) times daily.,  Starting 03/28/2014, Until Discontinued, Print          I personally performed the services described in this documentation, which was scribed in my presence. The recorded information has been reviewed and is accurate.     Monico Blitz, PA-C 03/28/14 [place], DO 03/30/14 [phone]

## 2014-03-28 NOTE — Discharge Instructions (Signed)
Take your antibiotics as directed and to completion. You should never have any leftover antibiotics! Push fluids and stay well hydrated.   Wash the affected area with soap and water and apply a thin layer of topical antibiotic ointment. Do this every 12 hours.   Do not use rubbing alcohol or hydrogen peroxide.                        Look for signs of infection: if you see redness, if the area becomes warm, if pain increases sharply, there is discharge (pus), if red streaks appear or you develop fever or vomiting, RETURN immediately to the Emergency Department  for a recheck.   Do not hesitate to return to the emergency room for any new, worsening or concerning symptoms.  Please obtain primary care using resource guide below. But the minute you were seen in the emergency room and that they will need to obtain records for further outpatient management.   Rash A rash is a change in the color or texture of your skin. There are many different types of rashes. You may have other problems that accompany your rash. CAUSES   Infections.  Allergic reactions. This can include allergies to pets or foods.  Certain medicines.  Exposure to certain chemicals, soaps, or cosmetics.  Heat.  Exposure to poisonous plants.  Tumors, both cancerous and noncancerous. SYMPTOMS   Redness.  Scaly skin.  Itchy skin.  Dry or cracked skin.  Bumps.  Blisters.  Pain. DIAGNOSIS  Your caregiver may do a physical exam to determine what type of rash you have. A skin sample (biopsy) may be taken and examined under a microscope. TREATMENT  Treatment depends on the type of rash you have. Your caregiver may prescribe certain medicines. For serious conditions, you may need to see a skin doctor (dermatologist). HOME CARE INSTRUCTIONS   Avoid the substance that caused your rash.  Do not scratch your rash. This can cause infection.  You may take cool baths to help stop itching.  Only take  over-the-counter or prescription medicines as directed by your caregiver.  Keep all follow-up appointments as directed by your caregiver. SEEK IMMEDIATE MEDICAL CARE IF:  You have increasing pain, swelling, or redness.  You have a fever.  You have new or severe symptoms.  You have body aches, diarrhea, or vomiting.  Your rash is not better after 3 days. MAKE SURE YOU:  Understand these instructions.  Will watch your condition.  Will get help right away if you are not doing well or get worse. Document Released: 04/17/2002 Document Revised: 07/20/2011 Document Reviewed: 02/09/2011 [facility] Patient Information 2015 [place], [place]. This information is not intended to replace advice given to you by your health care provider. Make sure you discuss any questions you have with your health care provider.   Emergency Department Resource Guide 1) Find a Doctor and Pay Out of Pocket Although you won't have to find out who is covered by your insurance plan, it is a good idea to ask around and get recommendations. You will then need to call the office and see if the doctor you have chosen will accept you as a new patient and what types of options they offer for patients who are self-pay. Some doctors offer discounts or will set up payment plans for their patients who do not have insurance, but you will need to ask so you aren't surprised when you get to your appointment.  2) Contact Your  Local Health Department Not all health departments have doctors that can see patients for sick visits, but many do, so it is worth a call to see if yours does. If you don't know where your local health department is, you can check in your phone book. The CDC also has a tool to help you locate your state's health department, and many state websites also have listings of all of their local health departments.  3) Find a [facility] If your illness is not likely to be very severe or complicated, you may want to  try a walk in clinic. These are popping up all over the country in pharmacies, drugstores, and shopping centers. They're usually staffed by nurse practitioners or physician assistants that have been trained to treat common illnesses and complaints. They're usually fairly quick and inexpensive. However, if you have serious medical issues or chronic medical problems, these are probably not your best option.  No Primary Care Doctor: - Call Health Connect at  [phone] - they can help you locate a primary care doctor that  accepts your insurance, provides certain services, etc. - Physician Referral Service- [phone]  Chronic Pain Problems: Organization         Address  Phone   Notes  [facility]  [phone] Patients need to be referred by their primary care doctor.   Medication Assistance: Organization         Address  Phone   Notes  [facility] Medication [facility] [address] [place], [place] [address] [phone] --Must be a resident of [facility] -- Must have NO insurance coverage whatsoever (no Medicaid/ Medicare, etc.) -- The pt. MUST have a primary care doctor that directs their care regularly and follows them in the community   MedAssist  [phone]   [employer]  [phone]    Agencies that provide inexpensive medical care: Organization         Address  Phone   Notes  [place]  [phone]   [facility]    [phone]   [facility] [place], [place] [address] [phone]   [place] [address] [place] [phone]   Planned Parenthood    [phone]   [facility]    [phone]   [place] and [address], [place] Phone:  [phone], Fax:  [phone] Hours of Operation:  9 am - 6 pm, M-F.  Also accepts Medicaid/Medicare and self-pay.  [facility] for [address], [address], [place] Phone: [phone], Fax: [phone]. Hours of Operation:  8:30 am - 5:30 pm, M-F.  Also accepts Medicaid and self-pay.  [facility] [place] [address] [place] Phone: [phone]   [place], [place], [place] [phone], Ext. [phone] Mondays & Thursdays: 7-9 AM.  First 15 patients are seen on a first come, first serve basis.    [place] Providers:  Organization         Address  Phone   Notes  [facility] [address], [place] [phone] Also accepts self-pay patients.  [place], [place]  [phone]   [place], [address], [place] [phone])  [place] [address] [place] [phone]   Lucianne Lei [address], [place]   [phone] Only accepts [place] Access [place] patients after they have their name applied to their card.   Self-Pay (no insurance) in [facility]:  Organization         Address  Phone   Notes  Sickle Cell Patients, [facility] Internal Medicine [place] [phone]   [facility] Urgent Care [place] [phone]   [facility] [place]  [place], [place], [place] [phone]   [facility]/Dr. Osei-Bonsu  [address] [place] or [address], [place] [phone] Phone number for both [place] and [place] locations is the same.  Urgent Medical and [facility] [address] [place] [phone]   [facility] [address] [place] or [address] [phone]   [facility] [address], [place] [phone], phone; [phone], fax  Sees patients 1st and 3rd Saturday of every month.  Must not qualify for public or private insurance (i.e. Medicaid, Medicare, [place] Health Choice, Veterans' Benefits)  Household income should be no more than 200% of the poverty level The clinic cannot treat you if you are pregnant or think you are pregnant  Sexually transmitted diseases are not treated at the clinic.    Dental Care: Organization         Address  Phone  Notes  [facility] Department of [facility] [phone] Accepts children up to age 80 who are enrolled in [place] or [place]; pregnant women with a Medicaid card; and children who have applied for Medicaid or [place] Health Choice, but were declined, whose parents can pay a reduced fee at time of service.  [facility] Department of [facility]  [address], [place] [phone] Accepts children up to age 62 who are enrolled in [place] or [place]; pregnant women with a Medicaid card; and children who have applied for Medicaid or [place] Health Choice, but were declined, whose parents can pay a reduced fee at time of service.  [facility] Adult Dental Access PROGRAM  [place] [phone] Patients are seen by appointment only. Walk-ins are not accepted. [place] will see patients 60 years of age and older. Monday - Tuesday (8am-5pm) Most Wednesdays (8:30-5pm) $30 per visit, cash only  [facility] Adult Dental Access PROGRAM  [address], [facility] [phone] Patients are seen by appointment only. Walk-ins are not accepted. [place] will see patients 64 years of age and older. One Wednesday Evening (Monthly: Volunteer Based).  $30 per visit, cash only  [place]  [phone] for adults; Children under age 93, call Graduate Pediatric Dentistry at [phone]. Children aged 36-14, please call [phone] to  request a pediatric application.  Dental services are provided in all areas of dental care including fillings, crowns and bridges, complete and partial dentures, implants, gum treatment, root canals, and extractions. Preventive care is also provided. Treatment is provided to both adults and children. Patients are selected via a lottery and there is often a waiting list.   [facility] [address], [place]  [phone])  [phone] [email or web address]   [facility] [address] [place], [place] [phone], Ext. [phone] Second and Fourth Thursday of each month, opens at 6:30 AM; Clinic ends at 9 AM.  Patients are seen on a first-come first-served basis, and a limited number are seen during each clinic.   [facility]  [address] Hillard Danker [place], [place] [phone]   Eligibility Requirements You must have lived in [place], [place], or [place] counties for at least the last three months.   You cannot be eligible for state or federal sponsored [employer], including [employer], [place], or [employer].   You generally cannot be eligible for healthcare insurance through your employer.    How to apply: Eligibility screenings are held every Tuesday and Wednesday afternoon from 1:00 pm until 4:00 pm. You do not need an appointment for the interview!  [facility] [address], [place], [place]   [place]  [place] Department  [place]  [phone]    Behavioral Health Resources in the Community: Intensive Outpatient Programs Organization         Address  Phone  Notes  [place]. [address], [place], [place] [phone]   [facility] Outpatient [address], [place], [place]   ADS: Alcohol & Drug Svcs [address] [place], [place]   [place] [address],  [place], [place] or [phone]   Substance Abuse Resources Organization         Address  Phone  Notes  Alcohol and Drug Services  [phone]   [place]  [phone]   The [place]   Chinita Pester  [phone]   Residential & Outpatient Substance Abuse Program  [phone]   Psychological Services Organization         Address  Phone  Notes  [facility] [place]  [place]  [phone]   [place] [address], [place] or [phone]    Mobile Crisis Teams Organization         Address  Phone  Notes  [facility]atives, Mobile Crisis Care Unit  [phone]   Assertive Psychotherapeutic Services  [address] [place], [place]   [place] [address], [place] [phone]    Self-Help/Support Groups Organization         Address  Phone             Notes  [place]. of [place] - variety of support groups  [place] Call for more information  Narcotics Anonymous (NA), Caring [facility] [address], [employer] [place]  2 meetings at this location   Special educational needs teacher         Address  Phone  Notes  ASAP Residential Treatment [place],    [place]  [phone]   [facility]  [address] [place] [address], [place], [place]   [place], [place] [phone] Admissions: 8am-3pm M-F  Incentives Substance [place] [address],    [place], [place] [phone]   [facility] [address] Jadene Pierini [place], [place]   The [place].,  [place], [place]   Insight Programs - Intensive  Outpatient [address], [place], [place]   [facility] (Teller.) [place].,  [place], [place] [phone] or [phone]   Residential Treatment Services (RTS) [address] [place], [place] Accepts Medicaid  [facility] [place] [address]  [place] [phone] Substance Abuse/Addiction Treatment   [facility] Organization         Address  Phone  Notes  [facility]  [phone]   Domenic Schwab, PhD [address] Arlis Porta [place], [place]   [phone] or [phone]   Hendron Tovey [place], [place] [phone]   [place] [address], [place], [place] [phone] Insurance/Medicaid/sponsorship through [facility] and Families [address]                                    [place], [place] [phone] [place] [address], [place] [phone]    Dr. Adele Schilder  [phone]   [facility] [place] Dept. 1) [address] [place] 2) [place] 3)  [place] [address], [place] [phone]  [phone]   [place] [phone] or [phone] (After Hours)

## 2014-03-28 NOTE — ED Notes (Signed)
Pt c/o rash on left hand as well as on left foot and some on right foot.  Pt states that he has had boils and rash intermittently for couple years.

## 2014-03-29 LAB — GC/CHLAMYDIA PROBE AMP
CT Probe RNA: NEGATIVE
GC Probe RNA: NEGATIVE

## 2014-03-30 NOTE — Progress Notes (Signed)
  CARE MANAGEMENT ED NOTE 03/30/2014  Patient:  Raymond Malone, Raymond Malone   Account Number:  [id number]  Date Initiated:  03/30/2014  Documentation initiated by:  Jackelyn Poling  Subjective/Objective Assessment:   47 self pay [place] pt c/o rash on left hand as well as on left foot and some on right foot.  Pt states that he has had boils and rash intermittently for couple years.          Subjective/Objective Assessment Detail:   no pcp as confirmed by pt  He agreed to receive a referral to [facility] after Cm discussed the program  Pt reports he has bactroban and does not need more bactroban but need assist with bactrim & vicodin  [facility]rim for $4     Action/Plan:   ED CM present when pt transferred from [facility] flow manager to [place] requesting assistance with medications rx provided on 03/28/14 Cm returned call to pt [phone] Cm discussed list of   Action/Plan Detail:   Rx given on 03/28/14 for vicodin, bactricin & septra Discuss not being able to assist with vicodin via MATCH Discussed Septra 800/160 20 tabs cost at [place], [employer] for $4   Anticipated DC Date:  03/30/2014     Status Recommendation to Physician:   Result of Recommendation:    Other ED Services  Consult Working [place]  Other  Outpatient Services - Pt will follow up  PCP issues  Medication Assistance    Choice offered to / List presented to:            Status of service:  Completed, signed off  ED Comments:   ED Comments Detail:  CM discussed written information for self pay pcps, importance of pcp for f/u care, www.needymeds.org, discounted pharmacies and other [employer] such as [employer], [employer], affordable care act,  financial assistance, DSS and  health department  Reviewed resources for [employer] self pay pcps like Jinny [place], family medicine at [address], [facility] family practice, general medical clinics, [facility] urgent care plus others,  medication resources, [facility] out patient pharmacies and housing Pt voiced understanding and appreciation of resources provided  Provided [facility] contact information Referral to [facility] completed  Discussed match cost $3, [facility] does not have program to provide co pay funds Pt voiced understanding

## 2014-04-10 ENCOUNTER — Emergency Department (HOSPITAL_COMMUNITY)
Admission: EM | Admit: 2014-04-10 | Discharge: 2014-04-10 | Disposition: A | Discharge status: Home / Self Care | Payer: Medicaid Other | Attending: Emergency Medicine | Admitting: Emergency Medicine

## 2014-04-10 ENCOUNTER — Encounter (HOSPITAL_COMMUNITY): Payer: Self-pay | Admitting: *Deleted

## 2014-04-10 DIAGNOSIS — K6289 Other specified diseases of anus and rectum: Secondary | ICD-10-CM | POA: Diagnosis not present

## 2014-04-10 DIAGNOSIS — Z792 Long term (current) use of antibiotics: Secondary | ICD-10-CM | POA: Diagnosis not present

## 2014-04-10 DIAGNOSIS — R197 Diarrhea, unspecified: Secondary | ICD-10-CM | POA: Insufficient documentation

## 2014-04-10 DIAGNOSIS — Z720 Tobacco use: Secondary | ICD-10-CM | POA: Insufficient documentation

## 2014-04-10 DIAGNOSIS — Z8614 Personal history of Methicillin resistant Staphylococcus aureus infection: Secondary | ICD-10-CM | POA: Insufficient documentation

## 2014-04-10 DIAGNOSIS — L509 Urticaria, unspecified: Secondary | ICD-10-CM | POA: Diagnosis not present

## 2014-04-10 DIAGNOSIS — R21 Rash and other nonspecific skin eruption: Secondary | ICD-10-CM | POA: Diagnosis present

## 2014-04-10 DIAGNOSIS — Z79899 Other long term (current) drug therapy: Secondary | ICD-10-CM | POA: Insufficient documentation

## 2014-04-10 MED ORDER — HYDROCORTISONE 1 % EX CREA
1.0000 | TOPICAL_CREAM | Freq: Two times a day (BID) | CUTANEOUS | Status: DC
Start: 2014-04-10 — End: 2016-07-10

## 2014-04-10 MED ORDER — IBUPROFEN 400 MG PO TABS
400.0000 mg | ORAL_TABLET | Freq: Once | ORAL | Status: AC
Start: 2014-04-10 — End: 2014-04-10
  Administered 2014-04-10: 400 mg via ORAL
  Filled 2014-04-10: qty 1

## 2014-04-10 MED ORDER — DIPHENHYDRAMINE HCL 25 MG PO CAPS
50.0000 mg | ORAL_CAPSULE | Freq: Once | ORAL | Status: AC
  Administered 2014-04-10: 50 mg via ORAL
  Filled 2014-04-10: qty 2

## 2014-04-10 MED ORDER — DIPHENHYDRAMINE HCL 25 MG PO TABS: 25.0000 mg | ORAL_TABLET | Freq: Four times a day (QID) | ORAL | Status: DC | PRN

## 2014-04-10 MED ORDER — HYDROCORTISONE 2.5 % RE CREA: TOPICAL_CREAM | RECTAL | Status: DC

## 2014-04-10 NOTE — ED Notes (Signed)
Pt requesting pain medicine due to elevated pain level in rectum from rectal exam.

## 2014-04-10 NOTE — Discharge Instructions (Signed)
Please follow up with your primary care physician in 1-2 days. If you do not have one please call the [employer] number listed above. Please follow up with the gastroenterologist to schedule a follow up appointment.  Please use medications as prescribed. Please read all discharge instructions and return precautions.   Rash A rash is a change in the color or texture of your skin. There are many different types of rashes. You may have other problems that accompany your rash. CAUSES   Infections.  Allergic reactions. This can include allergies to pets or foods.  Certain medicines.  Exposure to certain chemicals, soaps, or cosmetics.  Heat.  Exposure to poisonous plants.  Tumors, both cancerous and noncancerous. SYMPTOMS   Redness.  Scaly skin.  Itchy skin.  Dry or cracked skin.  Bumps.  Blisters.  Pain. DIAGNOSIS  Your caregiver may do a physical exam to determine what type of rash you have. A skin sample (biopsy) may be taken and examined under a microscope. TREATMENT  Treatment depends on the type of rash you have. Your caregiver may prescribe certain medicines. For serious conditions, you may need to see a skin doctor (dermatologist). HOME CARE INSTRUCTIONS   Avoid the substance that caused your rash.  Do not scratch your rash. This can cause infection.  You may take cool baths to help stop itching.  Only take over-the-counter or prescription medicines as directed by your caregiver.  Keep all follow-up appointments as directed by your caregiver. SEEK IMMEDIATE MEDICAL CARE IF:  You have increasing pain, swelling, or redness.  You have a fever.  You have new or severe symptoms.  You have body aches, diarrhea, or vomiting.  Your rash is not better after 3 days. MAKE SURE YOU:  Understand these instructions.  Will watch your condition.  Will get help right away if you are not doing well or get worse. Document Released: 04/17/2002  Document Revised: 07/20/2011 Document Reviewed: 02/09/2011 [facility] Patient Information 2015 [place], [place]. This information is not intended to replace advice given to you by your health care provider. Make sure you discuss any questions you have with your health care provider.  Proctalgia Fugax Proctalgia fugax is a very short episode of intense rectal pain. It can last from seconds to minutes. It often occurs in the night, and awakens the person from sleep. It is not a sign of cancer.  CAUSES  The cause of this often intense rectal pain is not known. One possible cause may be spasm of the pelvic muscles or of the lowest part of the large intestine.  SYMPTOMS  The pain of proctalgia fugax:  Is intensely severe.  Lasts from only a few seconds to thirty minutes.  Usually awakens the person from sleep. DIAGNOSIS  In order to make sure that there are no other problems, diagnostic tests may be done such as:   Anoscopy. This is a lighted scope that is put into the rectum to look for abnormalities.  Barium enema. X-rays are taken after administering a radio-sensitive material. TREATMENT  A number of things have been used to try to treat this condition, including:  Medications.  Warm baths.  Relaxation techniques.  Gentle massage of the painful area. HOME CARE INSTRUCTIONS   Take all medications exactly as directed.  Follow any prescribed diet.  Follow instructions regarding both rest and physical activity.  Learn progressive relaxation techniques. SEEK IMMEDIATE MEDICAL CARE IF:   Your pain does not get better in the usual  amount of time.  You develop any new symptoms. Document Released: 01/20/2001 Document Revised: 07/20/2011 Document Reviewed: 06/28/2008 [facility] Patient Information 2015 [place], [place]. This information is not intended to replace advice given to you by your health care provider. Make sure you discuss any questions you have with your health care  provider.

## 2014-04-10 NOTE — ED Notes (Addendum)
X 2 days ago developed a rash all over; the rash did go away for one day; and now its back. Denies any changes in environment. Rectal pain - very intermittent.
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
## 2014-04-15 ENCOUNTER — Emergency Department (HOSPITAL_COMMUNITY)
Admission: EM | Admit: 2014-04-15 | Discharge: 2014-04-15 | Disposition: A | Discharge status: Home / Self Care | Payer: Medicaid Other | Attending: Emergency Medicine | Admitting: Emergency Medicine

## 2014-04-15 ENCOUNTER — Encounter (HOSPITAL_COMMUNITY): Payer: Self-pay

## 2014-04-15 DIAGNOSIS — Z8614 Personal history of Methicillin resistant Staphylococcus aureus infection: Secondary | ICD-10-CM | POA: Diagnosis not present

## 2014-04-15 DIAGNOSIS — Z792 Long term (current) use of antibiotics: Secondary | ICD-10-CM | POA: Diagnosis not present

## 2014-04-15 DIAGNOSIS — Z79899 Other long term (current) drug therapy: Secondary | ICD-10-CM | POA: Diagnosis not present

## 2014-04-15 DIAGNOSIS — Z720 Tobacco use: Secondary | ICD-10-CM | POA: Insufficient documentation

## 2014-04-15 DIAGNOSIS — L02411 Cutaneous abscess of right axilla: Secondary | ICD-10-CM | POA: Diagnosis not present

## 2014-04-15 MED ORDER — HYDROCODONE-ACETAMINOPHEN 5-325 MG PO TABS: ORAL_TABLET | ORAL | Status: DC

## 2014-04-15 MED ORDER — LIDOCAINE HCL 2 % IJ SOLN
10.0000 mL | Freq: Once | INTRAMUSCULAR | Status: AC
  Administered 2014-04-15: 10 mL
  Filled 2014-04-15: qty 20

## 2014-04-15 MED ORDER — SULFAMETHOXAZOLE-TRIMETHOPRIM 800-160 MG PO TABS: 1.0000 | ORAL_TABLET | Freq: Two times a day (BID) | ORAL | Status: DC

## 2014-04-15 NOTE — Discharge Instructions (Signed)

## 2014-04-15 NOTE — ED Notes (Signed)
Wound cleaned and dressing applied.
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
## 2014-04-15 NOTE — ED Notes (Signed)
Pt with abscess boil under rt arm pit.  Pain and swelling x 2 days

## 2014-06-10 ENCOUNTER — Emergency Department (HOSPITAL_COMMUNITY)
Admission: EM | Admit: 2014-06-10 | Discharge: 2014-06-10 | Disposition: A | Discharge status: Home / Self Care | Payer: Medicaid Other | Attending: Emergency Medicine | Admitting: Emergency Medicine

## 2014-06-10 ENCOUNTER — Encounter (HOSPITAL_COMMUNITY): Payer: Self-pay | Admitting: *Deleted

## 2014-06-10 DIAGNOSIS — L02214 Cutaneous abscess of groin: Secondary | ICD-10-CM | POA: Insufficient documentation

## 2014-06-10 DIAGNOSIS — Z7952 Long term (current) use of systemic steroids: Secondary | ICD-10-CM | POA: Diagnosis not present

## 2014-06-10 DIAGNOSIS — Z79899 Other long term (current) drug therapy: Secondary | ICD-10-CM | POA: Insufficient documentation

## 2014-06-10 DIAGNOSIS — Z720 Tobacco use: Secondary | ICD-10-CM | POA: Diagnosis not present

## 2014-06-10 DIAGNOSIS — Z8614 Personal history of Methicillin resistant Staphylococcus aureus infection: Secondary | ICD-10-CM | POA: Diagnosis not present

## 2014-06-10 DIAGNOSIS — Z792 Long term (current) use of antibiotics: Secondary | ICD-10-CM | POA: Diagnosis not present

## 2014-06-10 DIAGNOSIS — L0291 Cutaneous abscess, unspecified: Secondary | ICD-10-CM | POA: Insufficient documentation

## 2014-06-10 DIAGNOSIS — R208 Other disturbances of skin sensation: Secondary | ICD-10-CM | POA: Diagnosis present

## 2014-06-10 HISTORY — DX: Cutaneous abscess, unspecified: L02.91

## 2014-06-10 MED ORDER — OXYCODONE-ACETAMINOPHEN 5-325 MG PO TABS
1.0000 | ORAL_TABLET | Freq: Once | ORAL | Status: AC
  Administered 2014-06-10: 1 via ORAL
  Filled 2014-06-10: qty 1

## 2014-06-10 MED ORDER — LIDOCAINE-EPINEPHRINE (PF) 2 %-1:200000 IJ SOLN
10.0000 mL | Freq: Once | INTRAMUSCULAR | Status: AC
  Administered 2014-06-10: 10 mL
  Filled 2014-06-10: qty 20

## 2014-06-10 MED ORDER — HYDROCODONE-ACETAMINOPHEN 5-325 MG PO TABS: ORAL_TABLET | ORAL | Status: DC

## 2014-06-10 MED ORDER — SULFAMETHOXAZOLE-TRIMETHOPRIM 800-160 MG PO TABS: 1.0000 | ORAL_TABLET | Freq: Two times a day (BID) | ORAL | Status: DC

## 2014-06-10 MED ORDER — SODIUM BICARBONATE 4 % IV SOLN
5.0000 mL | Freq: Once | INTRAVENOUS | Status: AC
  Administered 2014-06-10: 5 mL via SUBCUTANEOUS
  Filled 2014-06-10: qty 5

## 2014-06-10 NOTE — ED Notes (Signed)
Declined W/C at D/C and was escorted to lobby by RN. 
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
## 2014-06-10 NOTE — ED Notes (Signed)
Pt reports a boil located in the LT groin region.

## 2014-06-10 NOTE — Discharge Instructions (Signed)
Please read and follow all provided instructions.  Your diagnoses today include:  1. Abscess of groin, left     Tests performed today include:  Vital signs. See below for your results today.   Medications prescribed:   Bactrim (trimethoprim/sulfamethoxazole) - antibiotic  You have been prescribed an antibiotic medicine: take the entire course of medicine even if you are feeling better. Stopping early can cause the antibiotic not to work.   Vicodin (hydrocodone/acetaminophen) - narcotic pain medication  DO NOT drive or perform any activities that require you to be awake and alert because this medicine can make you drowsy. BE VERY CAREFUL not to take multiple medicines containing Tylenol (also called acetaminophen). Doing so can lead to an overdose which can damage your liver and cause liver failure and possibly death.  Take any prescribed medications only as directed.   Home care instructions:   Follow any educational materials contained in this packet  Soak in warm water 3 times a day  Follow-up instructions: Return to the Emergency Department in 48 hours for a recheck if your symptoms are not significantly improved.  Please follow-up with your primary care provider in the next 1 week for further evaluation of your symptoms.   Return instructions:  Return to the Emergency Department if you have:  Fever  Worsening symptoms  Worsening pain  Worsening swelling  Redness of the skin that moves away from the affected area, especially if it streaks away from the affected area   Any other emergent concerns  Additional Information: If you have recurrent abscesses, try both the following. Use a Qtip to apply an over-the-counter antibiotic to the inside of your nostrils, twice a day for 5 days. Wash your body with over-the-counter Hibaclens once a day for one week and then once every two weeks. This can reduce the amount of bacterial on your skin that causes boils and lead to  fewer boils. If you continue to have multiple or recurrent boils, you should see the surgeon listened.    Your vital signs today were: BP 164/84 mmHg   Pulse 84   Temp(Src) 98.4 F (36.9 C) (Oral)   Resp 16   Ht 5' 11"  (1.803 m)   Wt 220 lb (99.791 kg)   BMI 30.70 kg/m2   SpO2 94% If your blood pressure (BP) was elevated above 135/85 this visit, please have this repeated by your doctor within one month. --------------

## 2015-01-24 ENCOUNTER — Encounter (HOSPITAL_COMMUNITY): Payer: Self-pay | Admitting: Emergency Medicine

## 2015-01-24 ENCOUNTER — Emergency Department (HOSPITAL_COMMUNITY)
Admission: EM | Admit: 2015-01-24 | Discharge: 2015-01-24 | Disposition: A | Discharge status: Home / Self Care | Payer: Medicaid Other | Attending: Emergency Medicine | Admitting: Emergency Medicine

## 2015-01-24 DIAGNOSIS — I10 Essential (primary) hypertension: Secondary | ICD-10-CM | POA: Insufficient documentation

## 2015-01-24 DIAGNOSIS — Z7952 Long term (current) use of systemic steroids: Secondary | ICD-10-CM | POA: Insufficient documentation

## 2015-01-24 DIAGNOSIS — Z720 Tobacco use: Secondary | ICD-10-CM | POA: Insufficient documentation

## 2015-01-24 DIAGNOSIS — Z872 Personal history of diseases of the skin and subcutaneous tissue: Secondary | ICD-10-CM | POA: Insufficient documentation

## 2015-01-24 DIAGNOSIS — Z8614 Personal history of Methicillin resistant Staphylococcus aureus infection: Secondary | ICD-10-CM | POA: Insufficient documentation

## 2015-01-24 DIAGNOSIS — Z79899 Other long term (current) drug therapy: Secondary | ICD-10-CM | POA: Insufficient documentation

## 2015-01-24 DIAGNOSIS — R42 Dizziness and giddiness: Secondary | ICD-10-CM | POA: Insufficient documentation

## 2015-01-24 DIAGNOSIS — Z792 Long term (current) use of antibiotics: Secondary | ICD-10-CM | POA: Insufficient documentation

## 2015-01-24 HISTORY — DX: Essential (primary) hypertension: I10

## 2015-01-24 LAB — URINALYSIS, ROUTINE W REFLEX MICROSCOPIC
Glucose, UA: NEGATIVE mg/dL
Hgb urine dipstick: NEGATIVE
KETONES UR: 15 mg/dL — AB
LEUKOCYTES UA: NEGATIVE
NITRITE: NEGATIVE
PH: 6 (ref 5.0–8.0)
PROTEIN: NEGATIVE mg/dL
Specific Gravity, Urine: 1.031 — ABNORMAL HIGH (ref 1.005–1.030)
Urobilinogen, UA: 1 mg/dL (ref 0.0–1.0)

## 2015-01-24 LAB — CBC
HCT: 46.7 % (ref 39.0–52.0)
Hemoglobin: 16.2 g/dL (ref 13.0–17.0)
MCH: 29.8 pg (ref 26.0–34.0)
MCHC: 34.7 g/dL (ref 30.0–36.0)
MCV: 86 fL (ref 78.0–100.0)
PLATELETS: 340 10*3/uL (ref 150–400)
RBC: 5.43 MIL/uL (ref 4.22–5.81)
RDW: 13 % (ref 11.5–15.5)
WBC: 7.7 10*3/uL (ref 4.0–10.5)

## 2015-01-24 LAB — BASIC METABOLIC PANEL
ANION GAP: 8 (ref 5–15)
BUN: 10 mg/dL (ref 6–20)
CALCIUM: 9.3 mg/dL (ref 8.9–10.3)
CO2: 25 mmol/L (ref 22–32)
CREATININE: 1.05 mg/dL (ref 0.61–1.24)
Chloride: 105 mmol/L (ref 101–111)
GFR calc Af Amer: >60 mL/min (ref >60)
Glucose, Bld: 120 mg/dL — ABNORMAL HIGH (ref 65–99)
Potassium: 3.9 mmol/L (ref 3.5–5.1)
Sodium: 138 mmol/L (ref 135–145)

## 2015-01-24 LAB — CBG MONITORING, ED: Glucose-Capillary: 127 mg/dL — ABNORMAL HIGH (ref 65–99)

## 2015-01-24 NOTE — Discharge Instructions (Signed)
You were evaluated in the ED today and there is not appear to be an emergent cause for your symptoms at this time. Your exam, labs were very reassuring. Please follow-up with your primary care doctor in 3 days for reevaluation. Return to ED for worsening symptoms.  Dizziness Dizziness is a common problem. It is a feeling of unsteadiness or light-headedness. You may feel like you are about to faint. Dizziness can lead to injury if you stumble or fall. A person of any age group can suffer from dizziness, but dizziness is more common in older adults. CAUSES  Dizziness can be caused by many different things, including:  Middle ear problems.  Standing for too long.  Infections.  An allergic reaction.  Aging.  An emotional response to something, such as the sight of blood.  Side effects of medicines.  Tiredness.  Problems with circulation or blood pressure.  Excessive use of alcohol or medicines, or illegal drug use.  Breathing too fast (hyperventilation).  An irregular heart rhythm (arrhythmia).  A low red blood cell count (anemia).  Pregnancy.  Vomiting, diarrhea, fever, or other illnesses that cause body fluid loss (dehydration).  Diseases or conditions such as Parkinson's disease, high blood pressure (hypertension), diabetes, and thyroid problems.  Exposure to extreme heat. DIAGNOSIS  Your health care provider will ask about your symptoms, perform a physical exam, and perform an electrocardiogram (ECG) to record the electrical activity of your heart. Your health care provider may also perform other heart or blood tests to determine the cause of your dizziness. These may include:  Transthoracic echocardiogram (TTE). During echocardiography, sound waves are used to evaluate how blood flows through your heart.  Transesophageal echocardiogram (TEE).  Cardiac monitoring. This allows your health care provider to monitor your heart rate and rhythm in real time.  Holter  monitor. This is a portable device that records your heartbeat and can help diagnose heart arrhythmias. It allows your health care provider to track your heart activity for several days if needed.  Stress tests by exercise or by giving medicine that makes the heart beat faster. TREATMENT  Treatment of dizziness depends on the cause of your symptoms and can vary greatly. HOME CARE INSTRUCTIONS   Drink enough fluids to keep your urine clear or pale yellow. This is especially important in very hot weather. In older adults, it is also important in cold weather.  Take your medicine exactly as directed if your dizziness is caused by medicines. When taking blood pressure medicines, it is especially important to get up slowly.  Rise slowly from chairs and steady yourself until you feel okay.  In the morning, first sit up on the side of the bed. When you feel okay, stand slowly while holding onto something until you know your balance is fine.  Move your legs often if you need to stand in one place for a long time. Tighten and relax your muscles in your legs while standing.  Have someone stay with you for 1-2 days if dizziness continues to be a problem. Do this until you feel you are well enough to stay alone. Have the person call your health care provider if he or she notices changes in you that are concerning.  Do not drive or use heavy machinery if you feel dizzy.  Do not drink alcohol. SEEK IMMEDIATE MEDICAL CARE IF:   Your dizziness or light-headedness gets worse.  You feel nauseous or vomit.  You have problems talking, walking, or using your arms, hands,  or legs.  You feel weak.  You are not thinking clearly or you have trouble forming sentences. It may take a friend or family member to notice this.  You have chest pain, abdominal pain, shortness of breath, or sweating.  Your vision changes.  You notice any bleeding.  You have side effects from medicine that seems to be getting  worse rather than better. MAKE SURE YOU:   Understand these instructions.  Will watch your condition.  Will get help right away if you are not doing well or get worse. Document Released: 10/21/2000 Document Revised: 05/02/2013 Document Reviewed: 11/14/2010 [facility] Patient Information 2015 [place], [place]. This information is not intended to replace advice given to you by your health care provider. Make sure you discuss any questions you have with your health care provider.
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
## 2015-01-24 NOTE — ED Notes (Signed)
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 States tried several times to return to work but continued to be lightheaded and dizzy. Pt awake, alert, oriented x4, pt ambulatory, no neuro deficits noted. VSS.
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
## 2015-02-20 ENCOUNTER — Emergency Department (INDEPENDENT_AMBULATORY_CARE_PROVIDER_SITE_OTHER)
Admission: EM | Admit: 2015-02-20 | Discharge: 2015-02-20 | Disposition: A | Discharge status: Home / Self Care | Payer: Self-pay | Source: Home / Self Care | Attending: Emergency Medicine | Admitting: Emergency Medicine

## 2015-02-20 ENCOUNTER — Encounter (HOSPITAL_COMMUNITY): Payer: Self-pay | Admitting: Emergency Medicine

## 2015-02-20 DIAGNOSIS — A084 Viral intestinal infection, unspecified: Secondary | ICD-10-CM

## 2015-02-20 MED ORDER — ONDANSETRON HCL 4 MG PO TABS: 4.0000 mg | ORAL_TABLET | Freq: Three times a day (TID) | ORAL | Status: DC | PRN
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
## 2015-02-20 NOTE — Discharge Instructions (Signed)
You have a stomach bug. These typically last 24-48 hours. Make sure you are drinking plenty of fluids. Use the Zofran every 8 hours as needed for nausea. Follow-up as needed.

## 2015-02-20 NOTE — ED Notes (Signed)
Pt has been suffering from abdominal cramping and diarrhea since this morning.  He denies any fever or vomiting.  His daughter is here with him today, suffering from abdominal cramping, as well.

## 2015-04-17 ENCOUNTER — Emergency Department (HOSPITAL_COMMUNITY)
Admission: EM | Admit: 2015-04-17 | Discharge: 2015-04-17 | Disposition: A | Discharge status: Home / Self Care | Payer: Self-pay | Attending: Emergency Medicine | Admitting: Emergency Medicine

## 2015-04-17 ENCOUNTER — Encounter (HOSPITAL_COMMUNITY): Payer: Self-pay | Admitting: Emergency Medicine

## 2015-04-17 DIAGNOSIS — Z8614 Personal history of Methicillin resistant Staphylococcus aureus infection: Secondary | ICD-10-CM | POA: Insufficient documentation

## 2015-04-17 DIAGNOSIS — Z792 Long term (current) use of antibiotics: Secondary | ICD-10-CM | POA: Insufficient documentation

## 2015-04-17 DIAGNOSIS — I10 Essential (primary) hypertension: Secondary | ICD-10-CM | POA: Insufficient documentation

## 2015-04-17 DIAGNOSIS — F17210 Nicotine dependence, cigarettes, uncomplicated: Secondary | ICD-10-CM | POA: Insufficient documentation

## 2015-04-17 DIAGNOSIS — L0291 Cutaneous abscess, unspecified: Secondary | ICD-10-CM

## 2015-04-17 DIAGNOSIS — H578 Other specified disorders of eye and adnexa: Secondary | ICD-10-CM | POA: Insufficient documentation

## 2015-04-17 DIAGNOSIS — Z79899 Other long term (current) drug therapy: Secondary | ICD-10-CM | POA: Insufficient documentation

## 2015-04-17 DIAGNOSIS — H6591 Unspecified nonsuppurative otitis media, right ear: Secondary | ICD-10-CM | POA: Insufficient documentation

## 2015-04-17 DIAGNOSIS — H6691 Otitis media, unspecified, right ear: Secondary | ICD-10-CM

## 2015-04-17 DIAGNOSIS — L02214 Cutaneous abscess of groin: Secondary | ICD-10-CM | POA: Insufficient documentation

## 2015-04-17 MED ORDER — LIDOCAINE HCL (PF) 1 % IJ SOLN
30.0000 mL | Freq: Once | INTRAMUSCULAR | Status: AC
  Administered 2015-04-17: 30 mL via INTRADERMAL
  Filled 2015-04-17: qty 30

## 2015-04-17 MED ORDER — HYDROCODONE-ACETAMINOPHEN 5-325 MG PO TABS
1.0000 | ORAL_TABLET | Freq: Once | ORAL | Status: AC
  Administered 2015-04-17: 1 via ORAL
  Filled 2015-04-17: qty 1

## 2015-04-17 MED ORDER — AMOXICILLIN 500 MG PO CAPS: 500.0000 mg | ORAL_CAPSULE | Freq: Two times a day (BID) | ORAL | Status: DC

## 2015-04-17 NOTE — ED Notes (Signed)
Pt states he has a abscess that came up on his right groin area yesterday. Pt also reports right ear pain and a fullness feeling in his ear.

## 2015-04-17 NOTE — ED Notes (Addendum)
Cart set up for abscess outside room, pr changed and ready.

## 2015-04-17 NOTE — ED Notes (Signed)
Pt c/o abscess that formed to left groin approximately 2 days ago.  Pt also complaining of "water in my ear" on right side x 2 days.

## 2015-04-17 NOTE — ED Notes (Addendum)
Pt called out from room, requesting something for pain. Barrett PA is aware.

## 2015-04-17 NOTE — Discharge Instructions (Signed)
Abscess An abscess is an infected area that contains a collection of pus and debris.It can occur in almost any part of the body. An abscess is also known as a furuncle or boil. CAUSES  An abscess occurs when tissue gets infected. This can occur from blockage of oil or sweat glands, infection of hair follicles, or a minor injury to the skin. As the body tries to fight the infection, pus collects in the area and creates pressure under the skin. This pressure causes pain. People with weakened immune systems have difficulty fighting infections and get certain abscesses more often.  SYMPTOMS Usually an abscess develops on the skin and becomes a painful mass that is red, warm, and tender. If the abscess forms under the skin, you may feel a moveable soft area under the skin. Some abscesses break open (rupture) on their own, but most will continue to get worse without care. The infection can spread deeper into the body and eventually into the bloodstream, causing you to feel ill.  DIAGNOSIS  Your caregiver will take your medical history and perform a physical exam. A sample of fluid may also be taken from the abscess to determine what is causing your infection. TREATMENT  Your caregiver may prescribe antibiotic medicines to fight the infection. However, taking antibiotics alone usually does not cure an abscess. Your caregiver may need to make a small cut (incision) in the abscess to drain the pus. In some cases, gauze is packed into the abscess to reduce pain and to continue draining the area. HOME CARE INSTRUCTIONS   Only take over-the-counter or prescription medicines for pain, discomfort, or fever as directed by your caregiver.  If you were prescribed antibiotics, take them as directed. Finish them even if you start to feel better.  If gauze is used, follow your caregiver's directions for changing the gauze.  To avoid spreading the infection:  Keep your draining abscess covered with a  bandage.  Wash your hands well.  Do not share personal care items, towels, or whirlpools with others.  Avoid skin contact with others.  Keep your skin and clothes clean around the abscess.  Keep all follow-up appointments as directed by your caregiver. SEEK MEDICAL CARE IF:   You have increased pain, swelling, redness, fluid drainage, or bleeding.  You have muscle aches, chills, or a general ill feeling.  You have a fever. MAKE SURE YOU:   Understand these instructions.  Will watch your condition.  Will get help right away if you are not doing well or get worse.   This information is not intended to replace advice given to you by your health care provider. Make sure you discuss any questions you have with your health care provider.   Document Released: 02/04/2005 Document Revised: 10/27/2011 Document Reviewed: 07/10/2011 Elsevier Interactive Patient Education 2016 [employer].  Otitis Media, Adult Otitis media is redness, soreness, and inflammation of the middle ear. Otitis media may be caused by allergies or, most commonly, by infection. Often it occurs as a complication of the common cold. SIGNS AND SYMPTOMS Symptoms of otitis media may include:  Earache.  Fever.  Ringing in your ear.  Headache.  Leakage of fluid from the ear. DIAGNOSIS To diagnose otitis media, your health care provider will examine your ear with an otoscope. This is an instrument that allows your health care provider to see into your ear in order to examine your eardrum. Your health care provider also will ask you questions about your symptoms. TREATMENT  Typically, otitis media resolves on its own within 3-5 days. Your health care provider may prescribe medicine to ease your symptoms of pain. If otitis media does not resolve within 5 days or is recurrent, your health care provider may prescribe antibiotic medicines if he or she suspects that a bacterial infection is the cause. HOME CARE  INSTRUCTIONS   If you were prescribed an antibiotic medicine, finish it all even if you start to feel better.  Take medicines only as directed by your health care provider.  Keep all follow-up visits as directed by your health care provider. SEEK MEDICAL CARE IF:  You have otitis media only in one ear, or bleeding from your nose, or both.  You notice a lump on your neck.  You are not getting better in 3-5 days.  You feel worse instead of better. SEEK IMMEDIATE MEDICAL CARE IF:   You have pain that is not controlled with medicine.  You have swelling, redness, or pain around your ear or stiffness in your neck.  You notice that part of your face is paralyzed.  You notice that the bone behind your ear (mastoid) is tender when you touch it. MAKE SURE YOU:   Understand these instructions.  Will watch your condition.  Will get help right away if you are not doing well or get worse.   This information is not intended to replace advice given to you by your health care provider. Make sure you discuss any questions you have with your health care provider.   Document Released: 01/31/2004 Document Revised: 05/18/2014 Document Reviewed: 11/22/2012 Elsevier Interactive Patient Education [employer].
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
## 2015-07-07 ENCOUNTER — Encounter (HOSPITAL_COMMUNITY): Payer: Self-pay | Admitting: Emergency Medicine

## 2015-07-07 ENCOUNTER — Emergency Department (HOSPITAL_COMMUNITY)
Admission: EM | Admit: 2015-07-07 | Discharge: 2015-07-07 | Disposition: A | Discharge status: Home / Self Care | Payer: Self-pay | Attending: Emergency Medicine | Admitting: Emergency Medicine

## 2015-07-07 DIAGNOSIS — F17210 Nicotine dependence, cigarettes, uncomplicated: Secondary | ICD-10-CM | POA: Insufficient documentation

## 2015-07-07 DIAGNOSIS — Z79899 Other long term (current) drug therapy: Secondary | ICD-10-CM | POA: Insufficient documentation

## 2015-07-07 DIAGNOSIS — Z8614 Personal history of Methicillin resistant Staphylococcus aureus infection: Secondary | ICD-10-CM | POA: Insufficient documentation

## 2015-07-07 DIAGNOSIS — R1084 Generalized abdominal pain: Secondary | ICD-10-CM | POA: Insufficient documentation

## 2015-07-07 DIAGNOSIS — Z872 Personal history of diseases of the skin and subcutaneous tissue: Secondary | ICD-10-CM | POA: Insufficient documentation

## 2015-07-07 DIAGNOSIS — I10 Essential (primary) hypertension: Secondary | ICD-10-CM | POA: Insufficient documentation

## 2015-07-07 LAB — COMPREHENSIVE METABOLIC PANEL
ALBUMIN: 3.5 g/dL (ref 3.5–5.0)
ALK PHOS: 72 U/L (ref 38–126)
ALT: 34 U/L (ref 17–63)
ANION GAP: 10 (ref 5–15)
AST: 26 U/L (ref 15–41)
BILIRUBIN TOTAL: 0.6 mg/dL (ref 0.3–1.2)
BUN: 13 mg/dL (ref 6–20)
CALCIUM: 9 mg/dL (ref 8.9–10.3)
CO2: 22 mmol/L (ref 22–32)
Chloride: 108 mmol/L (ref 101–111)
Creatinine, Ser: 1.18 mg/dL (ref 0.61–1.24)
GFR calc Af Amer: >60 mL/min (ref >60)
GFR calc non Af Amer: >60 mL/min (ref >60)
GLUCOSE: 99 mg/dL (ref 65–99)
Potassium: 4.3 mmol/L (ref 3.5–5.1)
SODIUM: 140 mmol/L (ref 135–145)
Total Protein: 6.2 g/dL — ABNORMAL LOW (ref 6.5–8.1)

## 2015-07-07 LAB — CBC
HEMATOCRIT: 47.2 % (ref 39.0–52.0)
HEMOGLOBIN: 16.4 g/dL (ref 13.0–17.0)
MCH: 30.1 pg (ref 26.0–34.0)
MCHC: 34.7 g/dL (ref 30.0–36.0)
MCV: 86.6 fL (ref 78.0–100.0)
Platelets: 303 10*3/uL (ref 150–400)
RBC: 5.45 MIL/uL (ref 4.22–5.81)
RDW: 13 % (ref 11.5–15.5)
WBC: 7.1 10*3/uL (ref 4.0–10.5)

## 2015-07-07 LAB — URINALYSIS, ROUTINE W REFLEX MICROSCOPIC
BILIRUBIN URINE: NEGATIVE
Glucose, UA: NEGATIVE mg/dL
HGB URINE DIPSTICK: NEGATIVE
KETONES UR: 15 mg/dL — AB
Leukocytes, UA: NEGATIVE
Nitrite: NEGATIVE
PH: 5.5 (ref 5.0–8.0)
Protein, ur: NEGATIVE mg/dL
SPECIFIC GRAVITY, URINE: 1.03 (ref 1.005–1.030)

## 2015-07-07 LAB — LIPASE, BLOOD: Lipase: 25 U/L (ref 11–51)

## 2015-07-07 MED ORDER — DICYCLOMINE HCL 20 MG PO TABS: 20.0000 mg | ORAL_TABLET | Freq: Two times a day (BID) | ORAL | Status: DC

## 2015-07-07 MED ORDER — ONDANSETRON HCL 4 MG PO TABS: 4.0000 mg | ORAL_TABLET | Freq: Four times a day (QID) | ORAL | Status: DC

## 2015-07-07 MED ORDER — DICYCLOMINE HCL 10 MG PO CAPS
10.0000 mg | ORAL_CAPSULE | Freq: Once | ORAL | Status: AC
  Administered 2015-07-07: 10 mg via ORAL
  Filled 2015-07-07: qty 1

## 2015-07-07 MED ORDER — ONDANSETRON HCL 4 MG PO TABS
4.0000 mg | ORAL_TABLET | Freq: Once | ORAL | Status: AC
  Administered 2015-07-07: 4 mg via ORAL
  Filled 2015-07-07: qty 1

## 2015-07-07 NOTE — Discharge Instructions (Signed)
[name],  Nice meeting you! Please follow-up with your primary care provider. Return to the emergency department if you develop increased abdominal pain, are unable to keep foods down. Feel better soon!  S. Wendie Simmer, PA-C   Abdominal Pain, Adult Many things can cause abdominal pain. Usually, abdominal pain is not caused by a disease and will improve without treatment. It can often be observed and treated at home. Your health care provider will do a physical exam and possibly order blood tests and X-rays to help determine the seriousness of your pain. However, in many cases, more time must pass before a clear cause of the pain can be found. Before that point, your health care provider may not know if you need more testing or further treatment. HOME CARE INSTRUCTIONS Monitor your abdominal pain for any changes. The following actions may help to alleviate any discomfort you are experiencing:  Only take over-the-counter or prescription medicines as directed by your health care provider.  Do not take laxatives unless directed to do so by your health care provider.  Try a clear liquid diet (broth, tea, or water) as directed by your health care provider. Slowly move to a bland diet as tolerated. SEEK MEDICAL CARE IF:  You have unexplained abdominal pain.  You have abdominal pain associated with nausea or diarrhea.  You have pain when you urinate or have a bowel movement.  You experience abdominal pain that wakes you in the night.  You have abdominal pain that is worsened or improved by eating food.  You have abdominal pain that is worsened with eating fatty foods.  You have a fever. SEEK IMMEDIATE MEDICAL CARE IF:  Your pain does not go away within 2 hours.  You keep throwing up (vomiting).  Your pain is felt only in portions of the abdomen, such as the right side or the left lower portion of the abdomen.  You pass bloody or black tarry stools. MAKE SURE  YOU:  Understand these instructions.  Will watch your condition.  Will get help right away if you are not doing well or get worse.   This information is not intended to replace advice given to you by your health care provider. Make sure you discuss any questions you have with your health care provider.   Document Released: 02/04/2005 Document Revised: 01/16/2015 Document Reviewed: 01/04/2013 Elsevier Interactive Patient Education [employer].

## 2015-07-07 NOTE — ED Notes (Signed)
Pt from home with c/o generalized abdominal pain with N/V/D starting yesterday.  Reports his daughter had the same "virus" recently.  Pt in NAD, A&O.
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
## 2015-07-31 ENCOUNTER — Emergency Department (HOSPITAL_COMMUNITY)
Admission: EM | Admit: 2015-07-31 | Discharge: 2015-07-31 | Disposition: A | Discharge status: Home / Self Care | Payer: Self-pay | Attending: Emergency Medicine | Admitting: Emergency Medicine

## 2015-07-31 ENCOUNTER — Encounter (HOSPITAL_COMMUNITY): Payer: Self-pay | Admitting: *Deleted

## 2015-07-31 DIAGNOSIS — I10 Essential (primary) hypertension: Secondary | ICD-10-CM | POA: Insufficient documentation

## 2015-07-31 DIAGNOSIS — F17210 Nicotine dependence, cigarettes, uncomplicated: Secondary | ICD-10-CM | POA: Insufficient documentation

## 2015-07-31 DIAGNOSIS — Z8614 Personal history of Methicillin resistant Staphylococcus aureus infection: Secondary | ICD-10-CM | POA: Insufficient documentation

## 2015-07-31 DIAGNOSIS — Z792 Long term (current) use of antibiotics: Secondary | ICD-10-CM | POA: Insufficient documentation

## 2015-07-31 DIAGNOSIS — Z7952 Long term (current) use of systemic steroids: Secondary | ICD-10-CM | POA: Insufficient documentation

## 2015-07-31 DIAGNOSIS — Z79899 Other long term (current) drug therapy: Secondary | ICD-10-CM | POA: Insufficient documentation

## 2015-07-31 DIAGNOSIS — L02214 Cutaneous abscess of groin: Secondary | ICD-10-CM | POA: Insufficient documentation

## 2015-07-31 MED ORDER — LIDOCAINE-EPINEPHRINE (PF) 2 %-1:200000 IJ SOLN
10.0000 mL | Freq: Once | INTRAMUSCULAR | Status: AC
  Administered 2015-07-31: 10 mL
  Filled 2015-07-31: qty 20

## 2015-07-31 MED ORDER — NAPROXEN 500 MG PO TABS: 500.0000 mg | ORAL_TABLET | Freq: Two times a day (BID) | ORAL | Status: DC

## 2015-07-31 MED ORDER — SULFAMETHOXAZOLE-TRIMETHOPRIM 800-160 MG PO TABS: 1.0000 | ORAL_TABLET | Freq: Two times a day (BID) | ORAL | Status: AC

## 2015-07-31 MED ORDER — HYDROCODONE-ACETAMINOPHEN 5-325 MG PO TABS: 1.0000 | ORAL_TABLET | ORAL | Status: DC | PRN

## 2015-07-31 MED ORDER — HYDROCODONE-ACETAMINOPHEN 5-325 MG PO TABS
1.0000 | ORAL_TABLET | Freq: Once | ORAL | Status: AC
  Administered 2015-07-31: 1 via ORAL
  Filled 2015-07-31: qty 1

## 2015-07-31 MED ORDER — ONDANSETRON 4 MG PO TBDP
4.0000 mg | ORAL_TABLET | Freq: Once | ORAL | Status: AC
  Administered 2015-07-31: 4 mg via ORAL
  Filled 2015-07-31: qty 1
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
## 2015-07-31 NOTE — ED Notes (Signed)
Pt is no where to be found. Pt did not inform staff he was leaving. Pt will be discharged from the ED without review of discharge instructions

## 2015-07-31 NOTE — Discharge Instructions (Signed)
Please call your primary care provider to schedule a follow up appointment as soon as possible. You will need to be seen by your PCP or in the ER or any urgent care in 48 hours for a wound check. In the meantime take your antibiotics as prescribed. I will also give you a couple prescriptions to help with your pain.   Incision and Drainage Incision and drainage is a procedure in which a sac-like structure (cystic structure) is opened and drained. The area to be drained usually contains material such as pus, fluid, or blood.  LET YOUR CAREGIVER KNOW ABOUT:   Allergies to medicine.  Medicines taken, including vitamins, herbs, eyedrops, over-the-counter medicines, and creams.  Use of steroids (by mouth or creams).  Previous problems with anesthetics or numbing medicines.  History of bleeding problems or blood clots.  Previous surgery.  Other health problems, including diabetes and kidney problems.  Possibility of pregnancy, if this applies. RISKS AND COMPLICATIONS  Pain.  Bleeding.  Scarring.  Infection. BEFORE THE PROCEDURE  You may need to have an ultrasound or other imaging tests to see how large or deep your cystic structure is. Blood tests may also be used to determine if you have an infection or how severe the infection is. You may need to have a tetanus shot. PROCEDURE  The affected area is cleaned with a cleaning fluid. The cyst area will then be numbed with a medicine (local anesthetic). A small incision will be made in the cystic structure. A syringe or catheter may be used to drain the contents of the cystic structure, or the contents may be squeezed out. The area will then be flushed with a cleansing solution. After cleansing the area, it is often gently packed with a gauze or another wound dressing. Once it is packed, it will be covered with gauze and tape or some other type of wound dressing. AFTER THE PROCEDURE   Often, you will be allowed to go home right after the  procedure.  You may be given antibiotic medicine to prevent or heal an infection.  If the area was packed with gauze or some other wound dressing, you will likely need to come back in 1 to 2 days to get it removed.  The area should heal in about 14 days.   This information is not intended to replace advice given to you by your health care provider. Make sure you discuss any questions you have with your health care provider.   Document Released: 10/21/2000 Document Revised: 10/27/2011 Document Reviewed: 06/22/2011 Elsevier Interactive Patient Education [employer].

## 2015-07-31 NOTE — ED Notes (Signed)
Pt not in room to review discharge instructions.

## 2015-07-31 NOTE — ED Notes (Signed)
Patient presents with c/o abcess that he noticed yesterday to the left lower abd

## 2016-03-10 ENCOUNTER — Encounter (HOSPITAL_COMMUNITY): Payer: Self-pay

## 2016-03-10 ENCOUNTER — Emergency Department (HOSPITAL_COMMUNITY)
Admission: EM | Admit: 2016-03-10 | Discharge: 2016-03-10 | Disposition: A | Discharge status: Home / Self Care | Payer: Medicaid Other | Attending: Emergency Medicine | Admitting: Emergency Medicine

## 2016-03-10 DIAGNOSIS — Z79899 Other long term (current) drug therapy: Secondary | ICD-10-CM | POA: Insufficient documentation

## 2016-03-10 DIAGNOSIS — Z791 Long term (current) use of non-steroidal anti-inflammatories (NSAID): Secondary | ICD-10-CM | POA: Diagnosis not present

## 2016-03-10 DIAGNOSIS — R112 Nausea with vomiting, unspecified: Secondary | ICD-10-CM | POA: Diagnosis present

## 2016-03-10 DIAGNOSIS — I10 Essential (primary) hypertension: Secondary | ICD-10-CM | POA: Diagnosis not present

## 2016-03-10 DIAGNOSIS — F17210 Nicotine dependence, cigarettes, uncomplicated: Secondary | ICD-10-CM | POA: Insufficient documentation

## 2016-03-10 DIAGNOSIS — B349 Viral infection, unspecified: Secondary | ICD-10-CM | POA: Diagnosis not present

## 2016-03-10 LAB — CBC WITH DIFFERENTIAL/PLATELET
BASOS ABS: 0.1 10*3/uL (ref 0.0–0.1)
BASOS PCT: 1 %
EOS ABS: 0.7 10*3/uL (ref 0.0–0.7)
EOS PCT: 9 %
HEMATOCRIT: 41.7 % (ref 39.0–52.0)
Hemoglobin: 14.8 g/dL (ref 13.0–17.0)
Lymphocytes Relative: 26 %
Lymphs Abs: 2.1 10*3/uL (ref 0.7–4.0)
MCH: 30.8 pg (ref 26.0–34.0)
MCHC: 35.5 g/dL (ref 30.0–36.0)
MCV: 86.7 fL (ref 78.0–100.0)
MONO ABS: 0.5 10*3/uL (ref 0.1–1.0)
Monocytes Relative: 6 %
NEUTROS ABS: 4.7 10*3/uL (ref 1.7–7.7)
Neutrophils Relative %: 58 %
PLATELETS: 307 10*3/uL (ref 150–400)
RBC: 4.81 MIL/uL (ref 4.22–5.81)
RDW: 12.6 % (ref 11.5–15.5)
WBC: 8.1 10*3/uL (ref 4.0–10.5)

## 2016-03-10 LAB — COMPREHENSIVE METABOLIC PANEL
ALBUMIN: 3.8 g/dL (ref 3.5–5.0)
ALT: 51 U/L (ref 17–63)
AST: 38 U/L (ref 15–41)
Alkaline Phosphatase: 81 U/L (ref 38–126)
Anion gap: 9 (ref 5–15)
BUN: 12 mg/dL (ref 6–20)
CHLORIDE: 103 mmol/L (ref 101–111)
CO2: 23 mmol/L (ref 22–32)
Calcium: 8.9 mg/dL (ref 8.9–10.3)
Creatinine, Ser: 1.1 mg/dL (ref 0.61–1.24)
GFR calc Af Amer: >60 mL/min (ref >60)
GFR calc non Af Amer: >60 mL/min (ref >60)
GLUCOSE: 196 mg/dL — AB (ref 65–99)
POTASSIUM: 3.5 mmol/L (ref 3.5–5.1)
SODIUM: 135 mmol/L (ref 135–145)
Total Bilirubin: 0.3 mg/dL (ref 0.3–1.2)
Total Protein: 6.5 g/dL (ref 6.5–8.1)

## 2016-03-10 MED ORDER — ONDANSETRON HCL 4 MG/2ML IJ SOLN
4.0000 mg | Freq: Once | INTRAMUSCULAR | Status: AC
  Administered 2016-03-10: 4 mg via INTRAVENOUS
  Filled 2016-03-10: qty 2

## 2016-03-10 MED ORDER — ONDANSETRON 4 MG PO TBDP: ORAL_TABLET | ORAL | 0 refills | Status: DC

## 2016-03-10 MED ORDER — IBUPROFEN 800 MG PO TABS: 800.0000 mg | ORAL_TABLET | Freq: Three times a day (TID) | ORAL | 0 refills | Status: DC | PRN

## 2016-03-10 MED ORDER — SODIUM CHLORIDE 0.9 % IV BOLUS (SEPSIS)
1000.0000 mL | Freq: Once | INTRAVENOUS | Status: AC
  Administered 2016-03-10: 1000 mL via INTRAVENOUS

## 2016-03-10 MED ORDER — KETOROLAC TROMETHAMINE 30 MG/ML IJ SOLN
30.0000 mg | Freq: Once | INTRAMUSCULAR | Status: AC
  Administered 2016-03-10: 30 mg via INTRAVENOUS
  Filled 2016-03-10: qty 1
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
## 2016-03-10 NOTE — ED Notes (Signed)
Went to take vitals nurse in room

## 2016-03-10 NOTE — ED Triage Notes (Signed)
Fever, chills, vomiting, and diarrhea that started Sunday.  Fever was 103 this morning.  Tylenol and Ibuprofen was given to him an hour ago per family.  Patient complaining of headache and muscle aches.

## 2016-03-10 NOTE — Discharge Instructions (Signed)
Drink plenty of fluids and follow-up with her family doctor in a couple days if not improving

## 2016-07-10 ENCOUNTER — Emergency Department (HOSPITAL_COMMUNITY): Payer: Medicaid Other

## 2016-07-10 ENCOUNTER — Emergency Department (HOSPITAL_COMMUNITY)
Admission: EM | Admit: 2016-07-10 | Discharge: 2016-07-10 | Disposition: A | Discharge status: Home / Self Care | Payer: Medicaid Other | Attending: Emergency Medicine | Admitting: Emergency Medicine

## 2016-07-10 ENCOUNTER — Encounter (HOSPITAL_COMMUNITY): Payer: Self-pay | Admitting: Emergency Medicine

## 2016-07-10 DIAGNOSIS — J111 Influenza due to unidentified influenza virus with other respiratory manifestations: Secondary | ICD-10-CM

## 2016-07-10 DIAGNOSIS — R5383 Other fatigue: Secondary | ICD-10-CM | POA: Insufficient documentation

## 2016-07-10 DIAGNOSIS — R509 Fever, unspecified: Secondary | ICD-10-CM | POA: Diagnosis not present

## 2016-07-10 DIAGNOSIS — R05 Cough: Secondary | ICD-10-CM | POA: Insufficient documentation

## 2016-07-10 DIAGNOSIS — Z79899 Other long term (current) drug therapy: Secondary | ICD-10-CM | POA: Diagnosis not present

## 2016-07-10 DIAGNOSIS — M791 Myalgia: Secondary | ICD-10-CM | POA: Diagnosis not present

## 2016-07-10 DIAGNOSIS — R69 Illness, unspecified: Secondary | ICD-10-CM

## 2016-07-10 DIAGNOSIS — I10 Essential (primary) hypertension: Secondary | ICD-10-CM | POA: Insufficient documentation

## 2016-07-10 DIAGNOSIS — J3489 Other specified disorders of nose and nasal sinuses: Secondary | ICD-10-CM | POA: Diagnosis not present

## 2016-07-10 DIAGNOSIS — J029 Acute pharyngitis, unspecified: Secondary | ICD-10-CM | POA: Insufficient documentation

## 2016-07-10 DIAGNOSIS — R0981 Nasal congestion: Secondary | ICD-10-CM | POA: Diagnosis not present

## 2016-07-10 DIAGNOSIS — F17210 Nicotine dependence, cigarettes, uncomplicated: Secondary | ICD-10-CM | POA: Insufficient documentation

## 2016-07-10 MED ORDER — ALBUTEROL SULFATE HFA 108 (90 BASE) MCG/ACT IN AERS
2.0000 | INHALATION_SPRAY | RESPIRATORY_TRACT | Status: DC | PRN
  Administered 2016-07-10: 2 via RESPIRATORY_TRACT
  Filled 2016-07-10: qty 6.7

## 2016-07-10 MED ORDER — ACETAMINOPHEN 325 MG PO TABS
650.0000 mg | ORAL_TABLET | Freq: Once | ORAL | Status: AC
  Administered 2016-07-10: 650 mg via ORAL
  Filled 2016-07-10: qty 2

## 2016-07-10 NOTE — ED Triage Notes (Addendum)
PT c/o congested productive green sputum cough, sore throat, body aches, nasal congestion with no OTC medication treatment x1 day.
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
## 2016-10-03 ENCOUNTER — Emergency Department (HOSPITAL_COMMUNITY)
Admission: EM | Admit: 2016-10-03 | Discharge: 2016-10-03 | Disposition: A | Discharge status: Home / Self Care | Payer: Medicaid Other | Attending: Emergency Medicine | Admitting: Emergency Medicine

## 2016-10-03 ENCOUNTER — Encounter (HOSPITAL_COMMUNITY): Payer: Self-pay | Admitting: Emergency Medicine

## 2016-10-03 DIAGNOSIS — F17210 Nicotine dependence, cigarettes, uncomplicated: Secondary | ICD-10-CM | POA: Insufficient documentation

## 2016-10-03 DIAGNOSIS — L02411 Cutaneous abscess of right axilla: Secondary | ICD-10-CM | POA: Diagnosis not present

## 2016-10-03 DIAGNOSIS — I10 Essential (primary) hypertension: Secondary | ICD-10-CM | POA: Insufficient documentation

## 2016-10-03 MED ORDER — IBUPROFEN 600 MG PO TABS: 600.0000 mg | ORAL_TABLET | Freq: Four times a day (QID) | ORAL | 0 refills | Status: DC

## 2016-10-03 MED ORDER — DOXYCYCLINE HYCLATE 100 MG PO TABS
100.0000 mg | ORAL_TABLET | Freq: Once | ORAL | Status: AC
  Administered 2016-10-03: 100 mg via ORAL
  Filled 2016-10-03: qty 1

## 2016-10-03 MED ORDER — TRAMADOL HCL 50 MG PO TABS: 50.0000 mg | ORAL_TABLET | Freq: Four times a day (QID) | ORAL | 0 refills | Status: DC | PRN

## 2016-10-03 MED ORDER — TRAMADOL HCL 50 MG PO TABS
100.0000 mg | ORAL_TABLET | Freq: Once | ORAL | Status: AC
  Administered 2016-10-03: 100 mg via ORAL
  Filled 2016-10-03: qty 2

## 2016-10-03 MED ORDER — IBUPROFEN 800 MG PO TABS
800.0000 mg | ORAL_TABLET | Freq: Once | ORAL | Status: AC
  Administered 2016-10-03: 800 mg via ORAL
  Filled 2016-10-03: qty 1

## 2016-10-03 MED ORDER — CEPHALEXIN 500 MG PO CAPS
500.0000 mg | ORAL_CAPSULE | Freq: Once | ORAL | Status: AC
  Administered 2016-10-03: 500 mg via ORAL
  Filled 2016-10-03: qty 1

## 2016-10-03 MED ORDER — DOXYCYCLINE HYCLATE 100 MG PO CAPS: 100.0000 mg | ORAL_CAPSULE | Freq: Two times a day (BID) | ORAL | 0 refills | Status: DC

## 2016-10-03 MED ORDER — CEPHALEXIN 500 MG PO CAPS: 500.0000 mg | ORAL_CAPSULE | Freq: Four times a day (QID) | ORAL | 0 refills | Status: DC

## 2016-10-03 MED ORDER — ONDANSETRON HCL 4 MG PO TABS
4.0000 mg | ORAL_TABLET | Freq: Once | ORAL | Status: AC
  Administered 2016-10-03: 4 mg via ORAL
  Filled 2016-10-03: qty 1
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
## 2016-10-03 NOTE — Discharge Instructions (Signed)
Please soak the 2 abscess areas of your right under arm in warm salt water for about 15 minutes daily. Use Keflex and ibuprofen 4 times daily with each meal and at bedtime. Use doxycycline 2 times daily with a meal. Use Ultram for more severe pain.This medication may cause drowsiness. Please do not drink, drive, or participate in activity that requires concentration while taking this medication.  Please see your Medicaid access physician, or return to the emergency department if your condition worsens or any changes or problem.

## 2016-10-03 NOTE — ED Triage Notes (Signed)
abscess in rt axilla started yesterday

## 2016-10-06 ENCOUNTER — Encounter (HOSPITAL_COMMUNITY): Payer: Self-pay | Admitting: Emergency Medicine

## 2016-10-06 ENCOUNTER — Inpatient Hospital Stay (HOSPITAL_COMMUNITY)
Admission: EM | Admit: 2016-10-06 | Discharge: 2016-10-07 | DRG: 638 | Disposition: A | Discharge status: Home / Self Care | Payer: Medicaid Other | Attending: Internal Medicine | Admitting: Internal Medicine

## 2016-10-06 DIAGNOSIS — I10 Essential (primary) hypertension: Secondary | ICD-10-CM | POA: Diagnosis present

## 2016-10-06 DIAGNOSIS — F1020 Alcohol dependence, uncomplicated: Secondary | ICD-10-CM | POA: Diagnosis present

## 2016-10-06 DIAGNOSIS — Z791 Long term (current) use of non-steroidal anti-inflammatories (NSAID): Secondary | ICD-10-CM | POA: Diagnosis not present

## 2016-10-06 DIAGNOSIS — N481 Balanitis: Secondary | ICD-10-CM | POA: Diagnosis present

## 2016-10-06 DIAGNOSIS — Z79899 Other long term (current) drug therapy: Secondary | ICD-10-CM

## 2016-10-06 DIAGNOSIS — L02411 Cutaneous abscess of right axilla: Secondary | ICD-10-CM | POA: Diagnosis present

## 2016-10-06 DIAGNOSIS — F17210 Nicotine dependence, cigarettes, uncomplicated: Secondary | ICD-10-CM | POA: Diagnosis present

## 2016-10-06 DIAGNOSIS — F17200 Nicotine dependence, unspecified, uncomplicated: Secondary | ICD-10-CM

## 2016-10-06 DIAGNOSIS — Z8614 Personal history of Methicillin resistant Staphylococcus aureus infection: Secondary | ICD-10-CM

## 2016-10-06 DIAGNOSIS — E1100 Type 2 diabetes mellitus with hyperosmolarity without nonketotic hyperglycemic-hyperosmolar coma (NKHHC): Secondary | ICD-10-CM | POA: Diagnosis not present

## 2016-10-06 DIAGNOSIS — D751 Secondary polycythemia: Secondary | ICD-10-CM | POA: Diagnosis present

## 2016-10-06 DIAGNOSIS — Z23 Encounter for immunization: Secondary | ICD-10-CM

## 2016-10-06 DIAGNOSIS — E1111 Type 2 diabetes mellitus with ketoacidosis with coma: Secondary | ICD-10-CM

## 2016-10-06 DIAGNOSIS — L02419 Cutaneous abscess of limb, unspecified: Secondary | ICD-10-CM

## 2016-10-06 DIAGNOSIS — Z720 Tobacco use: Secondary | ICD-10-CM | POA: Diagnosis not present

## 2016-10-06 DIAGNOSIS — E1300 Other specified diabetes mellitus with hyperosmolarity without nonketotic hyperglycemic-hyperosmolar coma (NKHHC): Secondary | ICD-10-CM

## 2016-10-06 DIAGNOSIS — E1110 Type 2 diabetes mellitus with ketoacidosis without coma: Secondary | ICD-10-CM | POA: Diagnosis present

## 2016-10-06 HISTORY — DX: Type 2 diabetes mellitus with ketoacidosis without coma: E11.10

## 2016-10-06 HISTORY — DX: Cutaneous abscess of limb, unspecified: L02.419

## 2016-10-06 HISTORY — DX: Type 2 diabetes mellitus with hyperosmolarity without nonketotic hyperglycemic-hyperosmolar coma (NKHHC): E11.00

## 2016-10-06 HISTORY — DX: Nicotine dependence, unspecified, uncomplicated: F17.200

## 2016-10-06 HISTORY — DX: Alcohol dependence, uncomplicated: F10.20

## 2016-10-06 HISTORY — DX: Other specified diabetes mellitus with hyperosmolarity without nonketotic hyperglycemic-hyperosmolar coma (NKHHC): E13.00

## 2016-10-06 LAB — CBC WITH DIFFERENTIAL/PLATELET
Basophils Absolute: 0.1 10*3/uL (ref 0.0–0.1)
Basophils Relative: 1 %
EOS ABS: 0.1 10*3/uL (ref 0.0–0.7)
Eosinophils Relative: 2 %
HEMATOCRIT: 49.7 % (ref 39.0–52.0)
HEMOGLOBIN: 18.4 g/dL — AB (ref 13.0–17.0)
LYMPHS ABS: 1.5 10*3/uL (ref 0.7–4.0)
LYMPHS PCT: 18 %
MCH: 30.6 pg (ref 26.0–34.0)
MCHC: 37 g/dL — AB (ref 30.0–36.0)
MCV: 82.7 fL (ref 78.0–100.0)
Monocytes Absolute: 0.7 10*3/uL (ref 0.1–1.0)
Monocytes Relative: 9 %
NEUTROS ABS: 5.7 10*3/uL (ref 1.7–7.7)
NEUTROS PCT: 70 %
Platelets: 270 10*3/uL (ref 150–400)
RBC: 6.01 MIL/uL — AB (ref 4.22–5.81)
RDW: 11.8 % (ref 11.5–15.5)
WBC: 8.2 10*3/uL (ref 4.0–10.5)

## 2016-10-06 LAB — BASIC METABOLIC PANEL
ANION GAP: 15 (ref 5–15)
ANION GAP: 9 (ref 5–15)
Anion gap: 16 — ABNORMAL HIGH (ref 5–15)
BUN: 22 mg/dL — AB (ref 6–20)
BUN: 18 mg/dL (ref 6–20)
BUN: 15 mg/dL (ref 6–20)
CALCIUM: 9.4 mg/dL (ref 8.9–10.3)
CALCIUM: 9.1 mg/dL (ref 8.9–10.3)
CHLORIDE: 82 mmol/L — AB (ref 101–111)
CO2: 25 mmol/L (ref 22–32)
CO2: 23 mmol/L (ref 22–32)
CO2: 26 mmol/L (ref 22–32)
Calcium: 9.8 mg/dL (ref 8.9–10.3)
Chloride: 95 mmol/L — ABNORMAL LOW (ref 101–111)
Chloride: 96 mmol/L — ABNORMAL LOW (ref 101–111)
Creatinine, Ser: 1.38 mg/dL — ABNORMAL HIGH (ref 0.61–1.24)
Creatinine, Ser: 1 mg/dL (ref 0.61–1.24)
Creatinine, Ser: 0.89 mg/dL (ref 0.61–1.24)
GFR calc Af Amer: >60 mL/min (ref >60)
GFR calc non Af Amer: >60 mL/min (ref >60)
GLUCOSE: 199 mg/dL — AB (ref 65–99)
Glucose, Bld: 747 mg/dL (ref 65–99)
Glucose, Bld: 286 mg/dL — ABNORMAL HIGH (ref 65–99)
POTASSIUM: 4.1 mmol/L (ref 3.5–5.1)
POTASSIUM: 3.4 mmol/L — AB (ref 3.5–5.1)
POTASSIUM: 3.3 mmol/L — AB (ref 3.5–5.1)
SODIUM: 123 mmol/L — AB (ref 135–145)
SODIUM: 133 mmol/L — AB (ref 135–145)
SODIUM: 131 mmol/L — AB (ref 135–145)

## 2016-10-06 LAB — URINALYSIS, ROUTINE W REFLEX MICROSCOPIC
BACTERIA UA: NONE SEEN
Bilirubin Urine: NEGATIVE
Glucose, UA: >=500 mg/dL — AB
HGB URINE DIPSTICK: NEGATIVE
Ketones, ur: 20 mg/dL — AB
Leukocytes, UA: NEGATIVE
Nitrite: NEGATIVE
Protein, ur: NEGATIVE mg/dL
Specific Gravity, Urine: 1.025 (ref 1.005–1.030)
pH: 5 (ref 5.0–8.0)

## 2016-10-06 LAB — CBG MONITORING, ED
Glucose-Capillary: >600 mg/dL (ref 65–99)
Glucose-Capillary: >600 mg/dL (ref 65–99)
Glucose-Capillary: 519 mg/dL (ref 65–99)
Glucose-Capillary: 384 mg/dL — ABNORMAL HIGH (ref 65–99)

## 2016-10-06 LAB — GLUCOSE, CAPILLARY
GLUCOSE-CAPILLARY: 324 mg/dL — AB (ref 65–99)
Glucose-Capillary: 429 mg/dL — ABNORMAL HIGH (ref 65–99)

## 2016-10-06 LAB — MRSA PCR SCREENING: MRSA BY PCR: NEGATIVE

## 2016-10-06 LAB — BETA-HYDROXYBUTYRIC ACID: BETA-HYDROXYBUTYRIC ACID: 2.79 mmol/L — AB (ref 0.05–0.27)

## 2016-10-06 MED ORDER — PNEUMOCOCCAL VAC POLYVALENT 25 MCG/0.5ML IJ INJ
0.5000 mL | INJECTION | INTRAMUSCULAR | Status: AC
  Administered 2016-10-07: 0.5 mL via INTRAMUSCULAR
  Filled 2016-10-06: qty 0.5

## 2016-10-06 MED ORDER — SODIUM CHLORIDE 0.9 % IV SOLN
INTRAVENOUS | Status: DC
  Administered 2016-10-06: 5.4 [IU]/h via INTRAVENOUS
  Administered 2016-10-07: 7.4 [IU]/h via INTRAVENOUS
  Administered 2016-10-07: 7.7 [IU]/h via INTRAVENOUS
  Filled 2016-10-06 (×2): qty 1

## 2016-10-06 MED ORDER — THIAMINE HCL 100 MG/ML IJ SOLN: 100.0000 mg | Freq: Every day | INTRAMUSCULAR | Status: DC

## 2016-10-06 MED ORDER — SODIUM CHLORIDE 0.9 % IV SOLN: INTRAVENOUS | Status: DC

## 2016-10-06 MED ORDER — VITAMIN B-1 100 MG PO TABS
100.0000 mg | ORAL_TABLET | Freq: Every day | ORAL | Status: DC
  Administered 2016-10-06 – 2016-10-07 (×2): 100 mg via ORAL
  Filled 2016-10-06 (×2): qty 1

## 2016-10-06 MED ORDER — DEXTROSE-NACL 5-0.45 % IV SOLN
INTRAVENOUS | Status: DC
  Administered 2016-10-06 – 2016-10-07 (×3): via INTRAVENOUS

## 2016-10-06 MED ORDER — FOLIC ACID 1 MG PO TABS
1.0000 mg | ORAL_TABLET | Freq: Every day | ORAL | Status: DC
  Administered 2016-10-06 – 2016-10-07 (×2): 1 mg via ORAL
  Filled 2016-10-06 (×2): qty 1

## 2016-10-06 MED ORDER — LORAZEPAM 2 MG/ML IJ SOLN: 1.0000 mg | Freq: Four times a day (QID) | INTRAMUSCULAR | Status: DC | PRN

## 2016-10-06 MED ORDER — FLUCONAZOLE 100 MG PO TABS
200.0000 mg | ORAL_TABLET | Freq: Once | ORAL | Status: AC
  Administered 2016-10-06: 200 mg via ORAL
  Filled 2016-10-06: qty 2

## 2016-10-06 MED ORDER — LISINOPRIL 10 MG PO TABS
20.0000 mg | ORAL_TABLET | Freq: Every day | ORAL | Status: DC
  Administered 2016-10-06 – 2016-10-07 (×2): 20 mg via ORAL
  Filled 2016-10-06 (×2): qty 2

## 2016-10-06 MED ORDER — LORAZEPAM 1 MG PO TABS
1.0000 mg | ORAL_TABLET | Freq: Four times a day (QID) | ORAL | Status: DC | PRN
  Administered 2016-10-06: 1 mg via ORAL
  Filled 2016-10-06: qty 1

## 2016-10-06 MED ORDER — SODIUM CHLORIDE 0.9 % IV BOLUS (SEPSIS)
1000.0000 mL | Freq: Once | INTRAVENOUS | Status: AC
  Administered 2016-10-06: 1000 mL via INTRAVENOUS

## 2016-10-06 MED ORDER — SODIUM CHLORIDE 0.9 % IV SOLN
INTRAVENOUS | Status: DC
  Administered 2016-10-07: 12:00:00 via INTRAVENOUS

## 2016-10-06 MED ORDER — VANCOMYCIN HCL IN DEXTROSE 1-5 GM/200ML-% IV SOLN
1000.0000 mg | Freq: Two times a day (BID) | INTRAVENOUS | Status: DC
  Administered 2016-10-06 – 2016-10-07 (×2): 1000 mg via INTRAVENOUS
  Filled 2016-10-06 (×2): qty 200

## 2016-10-06 MED ORDER — VANCOMYCIN HCL 10 G IV SOLR
2000.0000 mg | Freq: Once | INTRAVENOUS | Status: AC
  Administered 2016-10-06: 2000 mg via INTRAVENOUS
  Filled 2016-10-06: qty 2000

## 2016-10-06 MED ORDER — SODIUM CHLORIDE 0.9 % IV SOLN: INTRAVENOUS | Status: AC

## 2016-10-06 MED ORDER — SODIUM CHLORIDE 0.9 % IV SOLN
INTRAVENOUS | Status: DC
  Administered 2016-10-06: 16:00:00 via INTRAVENOUS

## 2016-10-06 MED ORDER — ADULT MULTIVITAMIN W/MINERALS CH
1.0000 | ORAL_TABLET | Freq: Every day | ORAL | Status: DC
  Administered 2016-10-06 – 2016-10-07 (×2): 1 via ORAL
  Filled 2016-10-06 (×2): qty 1

## 2016-10-06 MED ORDER — ENOXAPARIN SODIUM 60 MG/0.6ML ~~LOC~~ SOLN
60.0000 mg | SUBCUTANEOUS | Status: DC
  Administered 2016-10-06 – 2016-10-07 (×2): 60 mg via SUBCUTANEOUS
  Filled 2016-10-06 (×2): qty 0.6

## 2016-10-06 MED ORDER — POTASSIUM CHLORIDE 10 MEQ/100ML IV SOLN
10.0000 meq | INTRAVENOUS | Status: AC
  Administered 2016-10-06 (×2): 10 meq via INTRAVENOUS
  Filled 2016-10-06 (×2): qty 100

## 2016-10-06 MED ORDER — ACETAMINOPHEN 325 MG PO TABS
650.0000 mg | ORAL_TABLET | Freq: Four times a day (QID) | ORAL | Status: DC | PRN
  Administered 2016-10-06: 650 mg via ORAL
  Filled 2016-10-06: qty 2

## 2016-10-06 MED ORDER — NICOTINE 21 MG/24HR TD PT24
21.0000 mg | MEDICATED_PATCH | Freq: Every day | TRANSDERMAL | Status: DC
  Administered 2016-10-06 – 2016-10-07 (×2): 21 mg via TRANSDERMAL
  Filled 2016-10-06 (×2): qty 1

## 2016-10-06 NOTE — Progress Notes (Signed)
Pharmacy Antibiotic Note  Raymond Malone is a 41 y.o. male admitted on 10/06/2016 with wound infection.  Pharmacy has been consulted for VANCOMYCIN dosing.  Plan: Vancomycin 2gm x 1 then 1gm IV q12hrs Monitor labs, progress, c/s Check Vanc trough at steady state  Height: 5' 11"  (180.3 cm) Weight: 250 lb (113.4 kg) IBW/kg (Calculated) : 75.3  Temp (24hrs), Avg:98.5 F (36.9 C), Min:98.2 F (36.8 C), Max:98.7 F (37.1 C)   Recent Labs Lab 10/06/16 1007  WBC 8.2  CREATININE 1.38*    Estimated Creatinine Clearance: 91.1 mL/min (A) (by C-G formula based on SCr of 1.38 mg/dL (H)).    No Known Allergies  Antimicrobials this admission: Vancomycin 5/29 >>   Dose adjustments this admission:  Microbiology results:  BCx: pending  UCx: pending   Sputum:    MRSA PCR:   Thank you for allowing pharmacy to be a part of this patient's care.  Hart Robinsons A 10/06/2016 3:29 PM

## 2016-10-06 NOTE — ED Notes (Signed)
Emptied urinal of 600 ml urine.

## 2016-10-06 NOTE — ED Notes (Signed)
CRITICAL VALUE ALERT  Critical Value:  Glucose 747  Date & Time Notied:  10/06/2016 1045  Provider Notified: Dr. Reather Converse Orders Received/Actions taken: 1045

## 2016-10-06 NOTE — ED Provider Notes (Signed)
[facility] DEPT Provider Note   CSN: [id number] Arrival date & time: 10/06/16  7673   History   Chief Complaint Chief Complaint  Patient presents with  . Hyperglycemia    HPI Raymond Malone is a 41 y.o. male.  HPI  Patient presents to ED with polyuria, polydipsia, dizziness, blurry vision. Past medical history significant for hypertension. Patient states that he has been having polyuria and polydipsia for about a month. Starting 2 days ago he started having dizziness, nausea, vomiting, dyspnea, and blurred vision. He decided to come to ED due to the symptoms. When he presented to the ED was noted to have elevated blood sugar. Patient denies any history of diabetes. No family history of diabetes per patient. Denies any rashes, fevers, abdominal pain, chest pain.   Past Medical History:  Diagnosis Date  . Hypertension   . MRSA (methicillin resistant Staphylococcus aureus)   . Skin abscess     Patient Active Problem List   Diagnosis Date Noted  . Skin abscess     Past Surgical History:  Procedure Laterality Date  . ANKLE SURGERY    . HAND SURGERY     boxer's fracture L hand  . INCISION AND DRAINAGE PERIRECTAL ABSCESS         Home Medications    Prior to Admission medications   Medication Sig Start Date End Date Taking? Authorizing Provider  cephALEXin (KEFLEX) 500 MG capsule Take 1 capsule (500 mg total) by mouth 4 (four) times daily. 10/03/16   Lily Kocher, PA-C  DM-Doxylamine-Acetaminophen (NYQUIL COLD & FLU PO) Take 15 mLs by mouth daily as needed (for cold).    [provider]  doxycycline (VIBRAMYCIN) 100 MG capsule Take 1 capsule (100 mg total) by mouth 2 (two) times daily. 10/03/16   Lily Kocher, PA-C  ibuprofen (ADVIL,MOTRIN) 600 MG tablet Take 1 tablet (600 mg total) by mouth 4 (four) times daily. 10/03/16   Lily Kocher, PA-C  traMADol (ULTRAM) 50 MG tablet Take 1 tablet (50 mg total) by mouth every 6 (six) hours as needed. 10/03/16   Lily Kocher, PA-C    Family History History reviewed. No pertinent family history.  Social History Social History  Substance Use Topics  . Smoking status: Current Every Day Smoker    Packs/day: 0.50    Types: Cigarettes  . Smokeless tobacco: Never Used  . Alcohol use Yes     Comment: Occasional     Allergies   Patient has no known allergies.   Review of Systems Review of Systems All systems reviewed and are negative for acute change except as noted in the HPI.  Physical Exam Updated Vital Signs BP 132/84 (BP Location: Right Arm)   Pulse (!) 117   Temp 98.2 F (36.8 C) (Oral)   Resp 20   Ht 5' 11"  (1.803 m)   Wt 113.4 kg (250 lb)   SpO2 97%   BMI 34.87 kg/m   Physical Exam  Constitutional: He is oriented to person, place, and time. He appears well-developed and well-nourished. No distress.  HENT:  Head: Normocephalic and atraumatic.  Nose: Nose normal.  Mouth/Throat: Oropharynx is clear and moist. Mucous membranes are dry. Abnormal dentition.  Eyes: Conjunctivae and EOM are normal. Pupils are equal, round, and reactive to light.  Neck: Normal range of motion. Neck supple.  Cardiovascular: Regular rhythm and normal pulses.  Tachycardia present.   Pulmonary/Chest: Effort normal and breath sounds normal.  Abdominal: Soft. Normal appearance and bowel sounds are normal.  There is no hepatosplenomegaly. There is no tenderness. There is no guarding.  Musculoskeletal: Normal range of motion. He exhibits no edema or tenderness.  Neurological: He is alert and oriented to person, place, and time. No cranial nerve deficit.  Skin: Skin is warm and dry.  Psychiatric: He has a normal mood and affect.  Nursing note and vitals reviewed.  ED Treatments / Results  Labs (all labs ordered are listed, but only abnormal results are displayed) Labs Reviewed  CBC WITH DIFFERENTIAL/PLATELET - Abnormal; Notable for the following:       Result Value   RBC 6.01 (*)    Hemoglobin 18.4 (*)      MCHC 37.0 (*)    All other components within normal limits  BASIC METABOLIC PANEL - Abnormal; Notable for the following:    Sodium 123 (*)    Chloride 82 (*)    Glucose, Bld 747 (*)    BUN 22 (*)    Creatinine, Ser 1.38 (*)    Anion gap 16 (*)    All other components within normal limits  URINALYSIS, ROUTINE W REFLEX MICROSCOPIC - Abnormal; Notable for the following:    Color, Urine STRAW (*)    Glucose, UA >=500 (*)    Ketones, ur 20 (*)    Squamous Epithelial / LPF 0-5 (*)    All other components within normal limits  CBG MONITORING, ED - Abnormal; Notable for the following:    Glucose-Capillary >600 (*)    All other components within normal limits  BETA-HYDROXYBUTYRIC ACID    EKG  EKG Interpretation None       Radiology No results found.  Procedures Procedures (including critical care time)  Medications Ordered in ED Medications  insulin regular (NOVOLIN R,HUMULIN R) 100 Units in sodium chloride 0.9 % 100 mL (1 Units/mL) infusion (not administered)  sodium chloride 0.9 % bolus 1,000 mL (not administered)    And  sodium chloride 0.9 % bolus 1,000 mL (not administered)    And  0.9 %  sodium chloride infusion (not administered)    Initial Impression / Assessment and Plan / ED Course  I have reviewed the triage vital signs and the nursing notes.  Pertinent labs & imaging results that were available during my care of the patient were reviewed by me and considered in my medical decision making (see chart for details).  Patient presenting to ED with multiple symptoms consistent with diabetes. No prior diagnosis of diabetes. On admission CBG >600 with glucose on blood work 747. Labs consistent with DKA with hyponatremia, hypochloremia, and anion gap. Potassium normal at this time and bicarbonate also normal. UA with glucosuria and ketones.   Will initiate DKA protocol and start IV fluids and insulin drip. Patient with recent infection that could have triggered this.  Will consult for admission to stepdown for patient.   Final Clinical Impressions(s) / ED Diagnoses   Final diagnoses:  Diabetic ketoacidosis without coma associated with type 2 diabetes mellitus [facility])    New Prescriptions New Prescriptions   No medications on file     Katheren Shams, DO 10/06/16 1309    Elnora Morrison, MD 10/06/16 [phone]

## 2016-10-06 NOTE — H&P (Signed)
History and Physical  Raymond Malone XBD:[id number] DOB: Dec 09, 1975 DOA: 10/06/2016   PCP: Marliss Coots, NP   Patient coming from: Home  Chief Complaint: Nausea, vomiting, polydipsia, polyuria  HPI:  Raymond Malone is a 41 y.o. male with medical history of hypertension, alcohol dependence, tobacco abuse presents with one-week history of polydipsia, polyuria that has progressed to the point where he began having dizziness for the past 2 days. In addition, the patient had nausea and vomiting on 10/04/2015. When he woke up on the morning of 10/07/2015, the patient had blurry vision with dizziness. As a result, the patient presented for medical care.  Patient denies fevers, chills, headache, chest pain, dyspnea, nausea, vomiting, diarrhea, abdominal pain, dysuria, hematuria, hematochezia, and melena. The patient was seen in the emergency department on 10/03/2016 for a right axillary abscess. He was discharged home with cephalexin and doxycycline. He states that the wound has been draining spontaneously since that period of time. He denies any other boils. He states that he usually drinks 8-10 shots of liquor at least once per week. He states that he last drank 2 days prior to this admission.  In the emergency department, the patient was afebrile, dynamically stable centering around percent on room air. CBC was unremarkable except for some polycythemia.  Sodium was 123. Patient had a serum glucose of 747 with anion gap of 16. He was started on fluid resuscitation and intravenous insulin.   Assessment/Plan: DKA/hyperosmolar state -The patient has bicarbonate of 25 but has anion gap of 16 with ketonuria --patient started on IV insulin with q 1 hour CBG check and q 4 hour BMPs -pt started on aggressive fluid resuscitation -Electrolytes will be monitored and repleted -transitioned to [place] insulin once anion gap closed -diet advance once anion gap closed -HbA1C-- -C-peptide  Alcohol  dependence -Alcohol withdrawal protocol  Right axillary abscess -Patient states that this has been present for approximately one week -Now spontaneously draining -IV vancomycin -Wound care consult  Tobacco abuse -Tobacco cessation discussed -NicoDerm patch  Hypertension -Continue lisinopril -Holding HCTZ  Balanitis -fluconazole x 1        Past Medical History:  Diagnosis Date  . Hypertension   . MRSA (methicillin resistant Staphylococcus aureus)   . Skin abscess    Past Surgical History:  Procedure Laterality Date  . ANKLE SURGERY    . HAND SURGERY     boxer's fracture L hand  . INCISION AND DRAINAGE PERIRECTAL ABSCESS     Social History:  reports that he has been smoking Cigarettes.  He has been smoking about 0.50 packs per day. He has never used smokeless tobacco. He reports that he drinks alcohol. He reports that he does not use drugs.   History reviewed. No pertinent family history.   No Known Allergies   Prior to Admission medications   Medication Sig Start Date End Date Taking? Authorizing Provider  cephALEXin (KEFLEX) 500 MG capsule Take 1 capsule (500 mg total) by mouth 4 (four) times daily. 10/03/16  Yes Lily Kocher, PA-C  doxycycline (VIBRAMYCIN) 100 MG capsule Take 1 capsule (100 mg total) by mouth 2 (two) times daily. 10/03/16  Yes Lily Kocher, PA-C  lisinopril-hydrochlorothiazide (PRINZIDE,ZESTORETIC) 20-25 MG tablet take 1 tablet by mouth daily 10/02/16  Yes [provider]  traMADol (ULTRAM) 50 MG tablet Take 1 tablet (50 mg total) by mouth every 6 (six) hours as needed. 10/03/16  Yes Lily Kocher, PA-C  DM-Doxylamine-Acetaminophen (NYQUIL COLD & FLU PO)  Take 15 mLs by mouth daily as needed (for cold).    [provider]  ibuprofen (ADVIL,MOTRIN) 600 MG tablet Take 1 tablet (600 mg total) by mouth 4 (four) times daily. 10/03/16   Lily Kocher, PA-C    Review of Systems:  Constitutional:  No weight loss, night sweats,  Fevers, chills, fatigue.  Head&Eyes: No headache.  No vision loss.  No eye pain or scotoma ENT:  No Difficulty swallowing,Tooth/dental problems,Sore throat,  No ear ache, post nasal drip,  Cardio-vascular:  No chest pain, Orthopnea, PND, swelling in lower extremities,  dizziness, palpitations  GI:  No  abdominal pain,  diarrhea, loss of appetite, hematochezia, melena, heartburn, indigestion, Resp:  No shortness of breath with exertion or at rest. No cough. No coughing up of blood .No wheezing.No chest wall deformity  Skin:  no rash or lesions.  GU:  no dysuria, change in color of urine, no urgency or frequency. No flank pain.  Musculoskeletal:  No joint pain or swelling. No decreased range of motion. No back pain.  Psych:  No change in mood or affect. No depression or anxiety. Neurologic: No headache, no dysesthesia, no focal weakness, no vision loss. No syncope  Physical Exam: Vitals:   10/06/16 0950 10/06/16 0952 10/06/16 1230  BP: 132/84  (!) 146/123  Pulse: (!) 117  83  Resp: 20  (!) 22  Temp: 98.2 F (36.8 C)    TempSrc: Oral    SpO2: 97%  98%  Weight:  113.4 kg (250 lb)   Height:  5' 11"  (1.803 m)    General:  A&O x 3, NAD, nontoxic, pleasant/cooperative Head/Eye: No conjunctival hemorrhage, no icterus, [place]/AT, No nystagmus ENT:  No icterus,  No thrush, good dentition, no pharyngeal exudate Neck:  No masses, no lymphadenpathy, no bruits CV:  RRR, no rub, no gallop, no S3 Lung:  CTAB, good air movement, no wheeze, no rhonchi Abdomen: soft/NT, +BS, nondistended, no peritoneal signs Ext: No cyanosis, No rashes, No petechiae, No lymphangitis, No edema Neuro: CNII-XII intact, strength 4/5 in bilateral upper and lower extremities, no dysmetria Skin--R-axilla with 2-3 cm induration with purulent drainage expressed.  No necrosis  Labs on Admission:  Basic Metabolic Panel:  Recent Labs Lab 10/06/16 1007  NA 123*  K 4.1  CL 82*  CO2 25  GLUCOSE 747*  BUN 22*   CREATININE 1.38*  CALCIUM 9.8   Liver Function Tests: No results for input(s): AST, ALT, ALKPHOS, BILITOT, PROT, ALBUMIN in the last 168 hours. No results for input(s): LIPASE, AMYLASE in the last 168 hours. No results for input(s): AMMONIA in the last 168 hours. CBC:  Recent Labs Lab 10/06/16 1007  WBC 8.2  NEUTROABS 5.7  HGB 18.4*  HCT 49.7  MCV 82.7  PLT 270   Coagulation Profile: No results for input(s): INR, PROTIME in the last 168 hours. Cardiac Enzymes: No results for input(s): CKTOTAL, CKMB, CKMBINDEX, TROPONINI in the last 168 hours. BNP: Invalid input(s): POCBNP CBG:  Recent Labs Lab 10/06/16 0957 10/06/16 1223  GLUCAP >600* >600*   Urine analysis:    Component Value Date/Time   COLORURINE STRAW (A) 10/06/2016 1100   APPEARANCEUR CLEAR 10/06/2016 1100   LABSPEC 1.025 10/06/2016 1100   PHURINE 5.0 10/06/2016 1100   GLUCOSEU >=500 (A) 10/06/2016 1100   HGBUR NEGATIVE 10/06/2016 1100   BILIRUBINUR NEGATIVE 10/06/2016 1100   KETONESUR 20 (A) 10/06/2016 1100   PROTEINUR NEGATIVE 10/06/2016 1100   UROBILINOGEN 1.0 01/24/2015 1630   NITRITE NEGATIVE  10/06/2016 1100   LEUKOCYTESUR NEGATIVE 10/06/2016 1100   Sepsis Labs: @LABRCNTIP (procalcitonin:4,lacticidven:4) )No results found for this or any previous visit (from the past 240 hour(s)).   Radiological Exams on Admission: No results found.     Time spent:60 minutes Code Status:   FULL Family Communication:  Spouse updated at bedside Disposition Plan: expect 1-2 day hospitalization Consults called: none DVT Prophylaxis: [place] Lovenox  Zalia Hautala, DO  [facility] Pager [phone]  If 7PM-7AM, please contact night-coverage [email or web address] Password [email or web address] 10/06/2016, 1:01 PM

## 2016-10-06 NOTE — ED Triage Notes (Addendum)
Patient complaining of dizziness, nausea, and blurry vision x 2 days. Also complaining of being extremely thirsty, and polyuria x 1 month. CBG in triage reads "HIGH"

## 2016-10-07 DIAGNOSIS — E1110 Type 2 diabetes mellitus with ketoacidosis without coma: Secondary | ICD-10-CM

## 2016-10-07 DIAGNOSIS — L02419 Cutaneous abscess of limb, unspecified: Secondary | ICD-10-CM

## 2016-10-07 LAB — GLUCOSE, CAPILLARY
GLUCOSE-CAPILLARY: 239 mg/dL — AB (ref 65–99)
GLUCOSE-CAPILLARY: 217 mg/dL — AB (ref 65–99)
GLUCOSE-CAPILLARY: 183 mg/dL — AB (ref 65–99)
GLUCOSE-CAPILLARY: 198 mg/dL — AB (ref 65–99)
GLUCOSE-CAPILLARY: 194 mg/dL — AB (ref 65–99)
GLUCOSE-CAPILLARY: 280 mg/dL — AB (ref 65–99)
GLUCOSE-CAPILLARY: 207 mg/dL — AB (ref 65–99)
GLUCOSE-CAPILLARY: 123 mg/dL — AB (ref 65–99)
GLUCOSE-CAPILLARY: 271 mg/dL — AB (ref 65–99)
GLUCOSE-CAPILLARY: 327 mg/dL — AB (ref 65–99)
Glucose-Capillary: 135 mg/dL — ABNORMAL HIGH (ref 65–99)
Glucose-Capillary: 115 mg/dL — ABNORMAL HIGH (ref 65–99)
Glucose-Capillary: 116 mg/dL — ABNORMAL HIGH (ref 65–99)
Glucose-Capillary: 147 mg/dL — ABNORMAL HIGH (ref 65–99)
Glucose-Capillary: 230 mg/dL — ABNORMAL HIGH (ref 65–99)
Glucose-Capillary: 237 mg/dL — ABNORMAL HIGH (ref 65–99)
Glucose-Capillary: 244 mg/dL — ABNORMAL HIGH (ref 65–99)
Glucose-Capillary: 259 mg/dL — ABNORMAL HIGH (ref 65–99)
Glucose-Capillary: 320 mg/dL — ABNORMAL HIGH (ref 65–99)
Glucose-Capillary: 287 mg/dL — ABNORMAL HIGH (ref 65–99)

## 2016-10-07 LAB — BASIC METABOLIC PANEL
ANION GAP: 9 (ref 5–15)
ANION GAP: 9 (ref 5–15)
BUN: 14 mg/dL (ref 6–20)
BUN: 16 mg/dL (ref 6–20)
CALCIUM: 9.1 mg/dL (ref 8.9–10.3)
CO2: 26 mmol/L (ref 22–32)
CO2: 26 mmol/L (ref 22–32)
CREATININE: 0.89 mg/dL (ref 0.61–1.24)
Calcium: 8.8 mg/dL — ABNORMAL LOW (ref 8.9–10.3)
Chloride: 99 mmol/L — ABNORMAL LOW (ref 101–111)
Chloride: 95 mmol/L — ABNORMAL LOW (ref 101–111)
Creatinine, Ser: 0.81 mg/dL (ref 0.61–1.24)
GFR calc Af Amer: >60 mL/min (ref >60)
GLUCOSE: 248 mg/dL — AB (ref 65–99)
Glucose, Bld: 133 mg/dL — ABNORMAL HIGH (ref 65–99)
Potassium: 3.2 mmol/L — ABNORMAL LOW (ref 3.5–5.1)
Potassium: 3 mmol/L — ABNORMAL LOW (ref 3.5–5.1)
SODIUM: 134 mmol/L — AB (ref 135–145)
Sodium: 130 mmol/L — ABNORMAL LOW (ref 135–145)

## 2016-10-07 LAB — HEMOGLOBIN A1C
HEMOGLOBIN A1C: 14.5 % — AB (ref 4.8–5.6)
MEAN PLASMA GLUCOSE: 369 mg/dL

## 2016-10-07 LAB — C-PEPTIDE: C PEPTIDE: 1.9 ng/mL (ref 1.1–4.4)

## 2016-10-07 LAB — HIV ANTIBODY (ROUTINE TESTING W REFLEX): HIV SCREEN 4TH GENERATION: NONREACTIVE

## 2016-10-07 MED ORDER — INSULIN GLARGINE 100 UNIT/ML ~~LOC~~ SOLN
25.0000 [IU] | SUBCUTANEOUS | Status: DC
  Administered 2016-10-07: 25 [IU] via SUBCUTANEOUS
  Filled 2016-10-07 (×2): qty 0.25

## 2016-10-07 MED ORDER — INSULIN GLARGINE 100 UNIT/ML SOLOSTAR PEN
30.0000 [IU] | PEN_INJECTOR | Freq: Every day | SUBCUTANEOUS | 11 refills | Status: DC
Start: 2016-10-07 — End: 2016-12-22

## 2016-10-07 MED ORDER — POTASSIUM CHLORIDE CRYS ER 20 MEQ PO TBCR
40.0000 meq | EXTENDED_RELEASE_TABLET | ORAL | Status: AC
  Administered 2016-10-07 (×3): 40 meq via ORAL
  Filled 2016-10-07 (×3): qty 2

## 2016-10-07 MED ORDER — CEPHALEXIN 500 MG PO CAPS: 500.0000 mg | ORAL_CAPSULE | Freq: Four times a day (QID) | ORAL | 0 refills | Status: DC

## 2016-10-07 MED ORDER — LIVING WELL WITH DIABETES BOOK
Freq: Once | Status: DC
  Filled 2016-10-07: qty 1

## 2016-10-07 MED ORDER — METFORMIN HCL 500 MG PO TABS: 500.0000 mg | ORAL_TABLET | Freq: Two times a day (BID) | ORAL | 11 refills | Status: DC

## 2016-10-07 MED ORDER — LIVING WELL WITH DIABETES BOOK - IN SPANISH
Freq: Once | Status: DC
  Filled 2016-10-07: qty 1

## 2016-10-07 MED ORDER — IBUPROFEN 600 MG PO TABS: 600.0000 mg | ORAL_TABLET | Freq: Four times a day (QID) | ORAL | 0 refills | Status: DC | PRN

## 2016-10-07 MED ORDER — INSULIN ASPART 100 UNIT/ML ~~LOC~~ SOLN: 0.0000 [IU] | Freq: Every day | SUBCUTANEOUS | Status: DC

## 2016-10-07 MED ORDER — LIVING WELL WITH DIABETES BOOK
Freq: Once | Status: AC
  Administered 2016-10-07: 1
  Filled 2016-10-07: qty 1

## 2016-10-07 MED ORDER — DOXYCYCLINE HYCLATE 100 MG PO CAPS: 100.0000 mg | ORAL_CAPSULE | Freq: Two times a day (BID) | ORAL | 0 refills | Status: DC

## 2016-10-07 MED ORDER — INSULIN ASPART 100 UNIT/ML ~~LOC~~ SOLN
0.0000 [IU] | Freq: Three times a day (TID) | SUBCUTANEOUS | Status: DC
  Administered 2016-10-07: 11 [IU] via SUBCUTANEOUS
  Administered 2016-10-07: 8 [IU] via SUBCUTANEOUS

## 2016-10-07 MED ORDER — SODIUM CHLORIDE 0.9 % IV SOLN
INTRAVENOUS | Status: AC
  Filled 2016-10-07: qty 1

## 2016-10-07 NOTE — Plan of Care (Signed)
Problem: Food- and Nutrition-Related Knowledge Deficit (NB-1.1) Goal: Nutrition education Formal process to instruct or train a patient/client in a skill or to impart knowledge to help patients/clients voluntarily manage or modify food choices and eating behavior to maintain or improve health. Outcome: Adequate for Discharge  RD consulted for nutrition education regarding diabetes. He is a new diabetic  Lab Results  Component Value Date   HGBA1C 14.5 (H) 10/06/2016   RD provided "Type 2 diabetes nutrition theray" and "Diabetes Label Reading Tips" handouts from the Academy of Nutrition and Dietetics as well as a copy of the diabetic "My Plate".   Dietary recall: Breakfast: Skips 2-3x a week. Eats bacon, sausage, eggs Lunch/Dinner: Crab legs, spaghetti, burger- sounds to be just random entrees Beverages: Sweet tea, juice, choc milk, Reg soda, water Other habits: Reports that he does NOT eat out. Does not sound to eat many vegetables  First and foremost, he is drinking a large number of sweet beverages. Explained this is the quickest way to have poor glycemic control. Strongly urged to reduce/eliminate consumption of his choc milk, sweet tea, reg soda, lemonade. Listed alternatives: Water, crystal light/mio flavored water, unsweet tea, G2 or low sugar sports drinks, diet soda, coffee.   He said he most wanted to know the correct portion sizes of each food group that he should be eating at each meal. Showed the Diabetic My Plate template. Ideally, 25-50% of meal will be protein, 25-50% veg and only 25% carb. This will lead to optimally controlled BG.   Explained protein and fibers effect on blood sugar and why it is so important to incorporate these at each meal.   Spoke about the starchy vegetables, listed what they were and emphasized how these are not "TRUE" vegetables. These are included in carb portion of meal.  Provided list of carbohydrates and recommended serving sizes of common foods.   Showed how some carbs have a larger portion size then others.   Touched briefly on carb counting. Defined a carb serving and asked he shoot for 60-80 g/meal and 30 g/snack. The rest of his meal should be comprised of veg/protein sources.   Summary of Reccomendations 1. Elimination of sugary beverages. Switch to sugar free/low carb bevs 2. Incorporate more veggies into meals-follow DM My Plate: 25% carbs, 25-50% protein, 25-50% veg 3. Do not skip meals 4. 60-80 g Carb per meal 5. Avoid eating out  Expect Fair-good  Compliance. Pt asked appropriate questions and had a fair amount of interaction.   Body mass index is 31.82 kg/m. Pt meets criteria for Obese based on current BMI.  Current diet order is Carb mod. No documented intake of meals at this time. Labs and medications reviewed. No further nutrition interventions warranted at this time. RD provided contact information to [facility] and the local RD who leads the DM education classes 2x/month. If additional nutrition issues arise, please re-consult RD.  Burtis Junes RD, LDN, CNSC Clinical Nutrition Pager: [phone] 10/07/2016 12:26 PM

## 2016-10-07 NOTE — Progress Notes (Signed)
Instructed on the use of insulin pen as well as drawing up and administering insulin, demonstrated and verbalized understanding.

## 2016-10-07 NOTE — Progress Notes (Signed)
Unable to have patient watch the diabetes education via the video system network due to the system is not working, diabetes coordinator Rosita Kea notified.  Instructed and return demonstration of Lantus insulin administration.

## 2016-10-07 NOTE — Consult Note (Signed)
[place] consulted, once I arrived to the black box to camera in bedside nurse reports that MD has cancelled consult. [place], [place]

## 2016-10-07 NOTE — Care Management Note (Signed)
Case Management Note  Patient Details  Name: Raymond Malone MRN: [id number] Date of Birth: Sep 21, 1975  Subjective/Objective:   Adm with DKA/hyperosmolar state. From home with wife, ind PTA. Has PCP in [place], recently made appointment to have PCP in [place]. Appointment date is June 10th. Will be new to Insulin. Insulin teaching being done by bedside RN. Patient feels comfortable administering.             Action/Plan: Patient given CBG meter prescription. Discussed need for sleep study, patient agreeable. Sleep Study order faxed to [place].    Expected Discharge Date:  10/11/16               Expected Discharge Plan:  Home/Self Care  In-House Referral:     Discharge planning Services  Medication Assistance  Post Acute Care Choice:    Choice offered to:     DME Arranged:    DME Agency:     HH Arranged:    HH Agency:     Status of Service:  Completed, signed off  If discussed at [employer] of Stay Meetings, dates discussed:    Additional Comments:  Bradlee Heitman, Chauncey [place], RN 10/07/2016, 3:03 PM

## 2016-10-07 NOTE — Progress Notes (Addendum)
Inpatient Diabetes Program Recommendations  AACE/ADA: New Consensus Statement on Inpatient Glycemic Control (2015)  Target Ranges:  Prepandial:   less than 140 mg/dL      Peak postprandial:   less than 180 mg/dL (1-2 hours)      Critically ill patients:  140 - 180 mg/dL   Lab Results  Component Value Date   GLUCAP 320 (H) 10/07/2016    Inpatient Diabetes Program Recommendations:    Consult for patient with new onset dm. Have ordered patient education per system video network as appropriate for patient. Please check HgbA1C which may assist in consideration of home dm medications at discharge. Patient has RD consult; RD will refer patient to OP education at [facility]. Will talk with patient and assess further needs. AD Have attempted to call patient/RN to talk with patient and cannot get an answer-will call again this afternoon. Ad-spoke with RN who states pt is to be discharged this afternoon. RN states patient would like to use the insulin pen rather than syringes. Requested RN to teach patient how to use the insulin pen with the unit's pen station provided. RN to call Diabetes Coordinator with questions or concerns. RD notes that he gave patient information on the [facility] op education center. I entered order and noted that patient lives in [place] and may want to attend sessions at [facility].   Thank you Rosita Kea, RN, MSN, CDE  Diabetes Inpatient Program Office: [phone] Pager: [phone] 8:00 am to 5:00 pm

## 2016-10-07 NOTE — Discharge Summary (Signed)
Physician Discharge Summary  Raymond Malone HKV:[id number] DOB: [birth year] DOA: 10/06/2016  PCP: Marliss Coots, NP  Admit date: 10/06/2016 Discharge date: 10/07/2016  Admitted From: home Disposition:  home  Recommendations for Outpatient Follow-up:  1. Follow up with PCP in 1-2 weeks 2. Please obtain BMP/CBC in one week  Home Health: Equipment/Devices:  Discharge Condition: stable CODE STATUS: full Diet recommendation: Heart Healthy / Carb Modified   Brief/Interim Summary: 41 year old male with history of hypertension, alcohol dependence and tobacco abuse, presented with nausea, vomiting, polydipsia and polyuria. He does not have new onset diabetes with DKA. He was started on insulin infusion with improvement of his blood sugar. Anion gap had closed and he was transitioned to subcutaneous insulin. A1c was noted to be markedly elevated at 14.5. He was started on Lantus and metformin. He received insulin teaching the hospital. He was also noted to have an abscess in his right axilla. This was treated with vancomycin. Abscess is spontaneously draining purulent material can be expressed. He was advised to continue warm compresses and continued to express. Material from his wound. I suspect this infection is likely driving his blood sugars. As infection improves, blood sugars will hopefully be easier to control. He'll be continued on oral antibiotics at home. He should follow-up with his primary care physician a 1-2 weeks.  Discharge Diagnoses:  Active Problems:   Hyperosmolarity due to secondary diabetes mellitus (HCC)   Hyperosmolar non-ketotic state in patient with type 2 diabetes mellitus ([place])   DKA, type 2 (HCC)   Abscess, axilla   Tobacco abuse   Alcohol dependence [facility]    Discharge Instructions  Discharge Instructions    Amb Referral to Nutrition and Diabetic E    Complete by:  As directed    [facility]   Ambulatory referral to Nutrition and Diabetic Education     Complete by:  As directed    Diet - low sodium heart healthy    Complete by:  As directed    Diet - low sodium heart healthy    Complete by:  As directed    Increase activity slowly    Complete by:  As directed    Increase activity slowly    Complete by:  As directed      Allergies as of 10/07/2016   No Known Allergies     Medication List    STOP taking these medications   traMADol 50 MG tablet Commonly known as:  ULTRAM     TAKE these medications   cephALEXin 500 MG capsule Commonly known as:  KEFLEX Take 1 capsule (500 mg total) by mouth 4 (four) times daily.   doxycycline 100 MG capsule Commonly known as:  VIBRAMYCIN Take 1 capsule (100 mg total) by mouth 2 (two) times daily.   ibuprofen 600 MG tablet Commonly known as:  ADVIL,MOTRIN Take 1 tablet (600 mg total) by mouth every 6 (six) hours as needed for mild pain. What changed:  when to take this  reasons to take this   Insulin Glargine 100 UNIT/ML Solostar Pen Commonly known as:  LANTUS Inject 30 Units into the skin daily at 10 pm.   lisinopril-hydrochlorothiazide 20-25 MG tablet Commonly known as:  PRINZIDE,ZESTORETIC take 1 tablet by mouth daily   metFORMIN 500 MG tablet Commonly known as:  GLUCOPHAGE Take 1 tablet (500 mg total) by mouth 2 (two) times daily with a meal.   NYQUIL COLD & FLU PO Take 15 mLs by mouth daily as needed (for cold).  No Known Allergies  Consultations:     Procedures/Studies: No results found.    Subjective: Feeling better today. Wants to go home.  Discharge Exam: Vitals:   10/07/16 1120 10/07/16 1600  BP:    Pulse: 98   Resp: 14   Temp: 98.1 F (36.7 C) 98.5 F (36.9 C)   Vitals:   10/07/16 0900 10/07/16 1000 10/07/16 1120 10/07/16 1600  BP: 126/81 109/73    Pulse: 99 91 98   Resp:   14   Temp:   98.1 F (36.7 C) 98.5 F (36.9 C)  TempSrc:   Oral Oral  SpO2: 94% 95% 97%   Weight:      Height:        General: Pt is alert, awake, not  in acute distress Cardiovascular: RRR, S1/S2 +, no rubs, no gallops Respiratory: CTA bilaterally, no wheezing, no rhonchi Abdominal: Soft, NT, ND, bowel sounds + Extremities: small area of induration in right axilla that is draining purulent material    The results of significant diagnostics from this hospitalization (including imaging, microbiology, ancillary and laboratory) are listed below for reference.     Microbiology: Recent Results (from the past 240 hour(s))  MRSA PCR Screening     Status: None   Collection Time: 10/06/16  3:34 PM  Result Value Ref Range Status   MRSA by PCR NEGATIVE NEGATIVE Final    Comment:        The GeneXpert MRSA Assay (FDA approved for NASAL specimens only), is one component of a comprehensive MRSA colonization surveillance program. It is not intended to diagnose MRSA infection nor to guide or monitor treatment for MRSA infections.      Labs: BNP (last 3 results) No results for input(s): BNP in the last 8760 hours. Basic Metabolic Panel:  Recent Labs Lab 10/06/16 1007 10/06/16 1648 10/06/16 2113 10/07/16 0128 10/07/16 0447  NA 123* 133* 131* 134* 130*  K 4.1 3.4* 3.3* 3.2* 3.0*  CL 82* 95* 96* 99* 95*  CO2 25 23 26 26 26   GLUCOSE 747* 286* 199* 133* 248*  BUN 22* 18 15 14 16   CREATININE 1.38* 1.00 0.89 0.81 0.89  CALCIUM 9.8 9.4 9.1 9.1 8.8*   Liver Function Tests: No results for input(s): AST, ALT, ALKPHOS, BILITOT, PROT, ALBUMIN in the last 168 hours. No results for input(s): LIPASE, AMYLASE in the last 168 hours. No results for input(s): AMMONIA in the last 168 hours. CBC:  Recent Labs Lab 10/06/16 1007  WBC 8.2  NEUTROABS 5.7  HGB 18.4*  HCT 49.7  MCV 82.7  PLT 270   Cardiac Enzymes: No results for input(s): CKTOTAL, CKMB, CKMBINDEX, TROPONINI in the last 168 hours. BNP: Invalid input(s): POCBNP CBG:  Recent Labs Lab 10/07/16 0856 10/07/16 0954 10/07/16 1119 10/07/16 1237 10/07/16 1617  GLUCAP 237*  320* 287* 271* 327*   D-Dimer No results for input(s): DDIMER in the last 72 hours. Hgb A1c  Recent Labs  10/06/16 1647  HGBA1C 14.5*   Lipid Profile No results for input(s): CHOL, HDL, LDLCALC, TRIG, CHOLHDL, LDLDIRECT in the last 72 hours. Thyroid function studies No results for input(s): TSH, T4TOTAL, T3FREE, THYROIDAB in the last 72 hours.  Invalid input(s): FREET3 Anemia work up No results for input(s): VITAMINB12, FOLATE, FERRITIN, TIBC, IRON, RETICCTPCT in the last 72 hours. Urinalysis    Component Value Date/Time   COLORURINE STRAW (A) 10/06/2016 1100   APPEARANCEUR CLEAR 10/06/2016 1100   LABSPEC 1.025 10/06/2016 1100   PHURINE 5.0 10/06/2016  1100   GLUCOSEU >=500 (A) 10/06/2016 1100   HGBUR NEGATIVE 10/06/2016 1100   BILIRUBINUR NEGATIVE 10/06/2016 1100   KETONESUR 20 (A) 10/06/2016 1100   PROTEINUR NEGATIVE 10/06/2016 1100   UROBILINOGEN 1.0 01/24/2015 1630   NITRITE NEGATIVE 10/06/2016 1100   LEUKOCYTESUR NEGATIVE 10/06/2016 1100   Sepsis Labs Invalid input(s): PROCALCITONIN,  WBC,  LACTICIDVEN Microbiology Recent Results (from the past 240 hour(s))  MRSA PCR Screening     Status: None   Collection Time: 10/06/16  3:34 PM  Result Value Ref Range Status   MRSA by PCR NEGATIVE NEGATIVE Final    Comment:        The GeneXpert MRSA Assay (FDA approved for NASAL specimens only), is one component of a comprehensive MRSA colonization surveillance program. It is not intended to diagnose MRSA infection nor to guide or monitor treatment for MRSA infections.      Time coordinating discharge: Over 30 minutes  SIGNED:   Kathie Dike, MD  [facility] 10/07/2016, 4:53 PM Pager   If 7PM-7AM, please contact night-coverage [email or web address] Password [email or web address]

## 2016-10-07 NOTE — Progress Notes (Signed)
Discharge instructions and prescription given, verbalized understanding, out in stable condition ambulatory with staff.

## 2016-10-14 ENCOUNTER — Ambulatory Visit (INDEPENDENT_AMBULATORY_CARE_PROVIDER_SITE_OTHER): Payer: Medicaid Other | Admitting: Family Medicine

## 2016-10-14 ENCOUNTER — Encounter: Payer: Self-pay | Admitting: Family Medicine

## 2016-10-14 VITALS — BP 130/78 | HR 92 | Temp 98.5°F | Resp 16 | Ht 71.0 in | Wt 232.0 lb

## 2016-10-14 DIAGNOSIS — I10 Essential (primary) hypertension: Secondary | ICD-10-CM | POA: Diagnosis not present

## 2016-10-14 DIAGNOSIS — Z720 Tobacco use: Secondary | ICD-10-CM | POA: Diagnosis not present

## 2016-10-14 DIAGNOSIS — Z09 Encounter for follow-up examination after completed treatment for conditions other than malignant neoplasm: Secondary | ICD-10-CM | POA: Diagnosis not present

## 2016-10-14 DIAGNOSIS — E119 Type 2 diabetes mellitus without complications: Secondary | ICD-10-CM | POA: Diagnosis not present

## 2016-10-14 DIAGNOSIS — F1020 Alcohol dependence, uncomplicated: Secondary | ICD-10-CM | POA: Diagnosis not present

## 2016-10-14 MED ORDER — NICOTINE 21 MG/24HR TD PT24: 21.0000 mg | MEDICATED_PATCH | Freq: Every day | TRANSDERMAL | 0 refills | Status: DC
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
## 2016-10-14 NOTE — Patient Instructions (Signed)
Use the nicotine patch to try to stop smoking Need blood work in 3 months See me in one month I will refer to endocrinology I will see about the sleep study

## 2016-10-21 ENCOUNTER — Ambulatory Visit: Payer: Medicaid Other | Admitting: Nutrition

## 2016-10-26 ENCOUNTER — Telehealth: Payer: Self-pay | Admitting: *Deleted

## 2016-10-26 NOTE — Telephone Encounter (Signed)
Patient came in today requesting to be seen sooner than the 1 month follow up appt for next month, patient states since he was seen in the hospital his vision has been blurry. I have him scheduled for Dr Francesca Oman next available 11/02/16 for this problem, patient requested to be seen sooner than that, I made him aware I will get a message to Dr Francesca Oman nurse. Please advise [phone]

## 2016-10-26 NOTE — Telephone Encounter (Signed)
Selena give him next avail.

## 2016-10-27 ENCOUNTER — Encounter: Payer: Self-pay | Admitting: Family Medicine

## 2016-10-27 ENCOUNTER — Ambulatory Visit (INDEPENDENT_AMBULATORY_CARE_PROVIDER_SITE_OTHER): Payer: Medicaid Other | Admitting: Family Medicine

## 2016-10-27 VITALS — BP 118/74 | HR 96 | Temp 96.3°F | Resp 18 | Ht 71.0 in | Wt 233.1 lb

## 2016-10-27 DIAGNOSIS — Z720 Tobacco use: Secondary | ICD-10-CM

## 2016-10-27 DIAGNOSIS — L301 Dyshidrosis [pompholyx]: Secondary | ICD-10-CM

## 2016-10-27 DIAGNOSIS — G4730 Sleep apnea, unspecified: Secondary | ICD-10-CM

## 2016-10-27 DIAGNOSIS — I10 Essential (primary) hypertension: Secondary | ICD-10-CM

## 2016-10-27 DIAGNOSIS — E119 Type 2 diabetes mellitus without complications: Secondary | ICD-10-CM | POA: Diagnosis not present

## 2016-10-27 HISTORY — DX: Dyshidrosis (pompholyx): L30.1

## 2016-10-27 LAB — POCT CBG (FASTING - GLUCOSE)-MANUAL ENTRY: GLUCOSE FASTING, POC: 246 mg/dL — AB (ref 70–99)

## 2016-10-27 MED ORDER — METFORMIN HCL 1000 MG PO TABS: 1000.0000 mg | ORAL_TABLET | Freq: Two times a day (BID) | ORAL | 11 refills | Status: DC

## 2016-10-27 MED ORDER — MOMETASONE FUROATE 0.1 % EX CREA: 1.0000 "application " | TOPICAL_CREAM | Freq: Every day | CUTANEOUS | 1 refills | Status: DC

## 2016-10-27 NOTE — Progress Notes (Signed)
Chief Complaint  Patient presents with  . Blurred Vision  . Fatigue   Here sooner than expected Sugars coming down - but slowly. Fasting today 246 Still with blurred vision Still with fatigue Still with polyuria I will double his metformin I am hesitant to increase his insulin since he is not consistent with sugar testing Reinforced diet and exercise Will follow up on his referral to Dr Dorris Fetch No low sugar or hypoglycemia symptoms Has stopped alcohol Has reduced cigarettes Need to [place] emphasized  Patient Active Problem List   Diagnosis Date Noted  . Dyshidrotic eczema 10/27/2016  . Type 2 diabetes mellitus without complications ([place]) 60/45/4098  . Essential hypertension 10/14/2016  . Abscess, axilla 10/06/2016  . Tobacco abuse 10/06/2016  . Alcohol dependence ([place]) 10/06/2016    Outpatient Encounter Prescriptions as of 10/27/2016  Medication Sig  . Insulin Glargine (LANTUS) 100 UNIT/ML Solostar Pen Inject 30 Units into the skin daily at 10 pm.  . lisinopril-hydrochlorothiazide (PRINZIDE,ZESTORETIC) 20-25 MG tablet take 1 tablet by mouth daily  . metFORMIN (GLUCOPHAGE) 1000 MG tablet Take 1 tablet (1,000 mg total) by mouth 2 (two) times daily with a meal.  . nicotine (NICODERM CQ - DOSED IN MG/24 HOURS) 21 mg/24hr patch Place 1 patch (21 mg total) onto the skin daily.  . mometasone (ELOCON) 0.1 % cream Apply 1 application topically daily.   No facility-administered encounter medications on file as of 10/27/2016.     No Known Allergies  Review of Systems  Constitutional: Positive for activity change and fatigue. Negative for appetite change and unexpected weight change.  HENT: Negative for congestion and dental problem.   Eyes: Positive for visual disturbance.       Trouble focusing  Respiratory: Negative for cough and shortness of breath.   Cardiovascular: Negative for chest pain and palpitations.  Gastrointestinal: Negative for constipation and diarrhea.    Endocrine: Positive for polydipsia and polyuria.  Genitourinary: Positive for frequency. Negative for difficulty urinating.  Musculoskeletal: Negative.  Negative for arthralgias and back pain.  Skin: Positive for rash.       Blisters on hands  Neurological: Negative.  Negative for dizziness and facial asymmetry.  Psychiatric/Behavioral: Negative for dysphoric mood. The patient is not nervous/anxious.       BP 118/74 (BP Location: Right Arm, Patient Position: Sitting, Cuff Size: Large)   Pulse 96   Temp (!) 96.3 F (35.7 C) (Temporal)   Resp 18   Ht 5' 11"  (1.803 m)   Wt 233 lb 1.9 oz (105.7 kg)   SpO2 98%   BMI 32.51 kg/m   Physical Exam  Constitutional: He is oriented to person, place, and time. He appears well-developed and well-nourished. No distress.  HENT:  Head: Normocephalic and atraumatic.  Mouth/Throat: Oropharynx is clear and moist.  Eyes: Conjunctivae are normal. Pupils are equal, round, and reactive to light.  Neck: Normal range of motion.  Cardiovascular: Normal rate, regular rhythm and normal heart sounds.   Pulmonary/Chest: Effort normal and breath sounds normal.  Musculoskeletal: Normal range of motion. He exhibits no edema.  Lymphadenopathy:    He has no cervical adenopathy.  Neurological: He is alert and oriented to person, place, and time.  Skin:  Tiny vesicles on  Fingers c/w dyshidrotic eczema  Psychiatric: He has a normal mood and affect. His behavior is normal.    ASSESSMENT/PLAN:  1. Type 2 diabetes mellitus without complication, unspecified whether long term insulin use (HCC)  - POCT CBG (Fasting - Glucose) -  Ambulatory referral to Ophthalmology  2. Sleep-disordered breathing Noted in hosptial - Nocturnal polysomnography (NPSG); Future  3. Essential hypertension controlled  4. Dyshidrotic eczema Rx elocon  5. Tobacco abuse QUIT   Patient Instructions  Please call Dr Liliane Channel office to set up consult appointment (  authorized) Continue to try to quit smoking Need blood work in end August I have ordered sleep study I have ordered diabetic eye exam  Metformin will be increased to 1000 mg twice a day with food Let me know if this upsets your stomach       Raylene Everts, MD

## 2016-10-27 NOTE — Patient Instructions (Addendum)
Please call Dr Liliane Channel office to set up consult appointment ( authorized) Continue to try to quit smoking Need blood work in end August I have ordered sleep study I have ordered diabetic eye exam  Metformin will be increased to 1000 mg twice a day with food Let me know if this upsets your stomach

## 2016-10-27 NOTE — Telephone Encounter (Signed)
Called pt, coming at 1115 today.

## 2016-10-30 ENCOUNTER — Encounter (HOSPITAL_COMMUNITY): Payer: Self-pay | Admitting: Emergency Medicine

## 2016-10-30 ENCOUNTER — Emergency Department (HOSPITAL_COMMUNITY)
Admission: EM | Admit: 2016-10-30 | Discharge: 2016-10-31 | Disposition: A | Discharge status: Home / Self Care | Payer: Medicaid Other | Attending: Emergency Medicine | Admitting: Emergency Medicine

## 2016-10-30 DIAGNOSIS — E119 Type 2 diabetes mellitus without complications: Secondary | ICD-10-CM | POA: Insufficient documentation

## 2016-10-30 DIAGNOSIS — I10 Essential (primary) hypertension: Secondary | ICD-10-CM | POA: Diagnosis not present

## 2016-10-30 DIAGNOSIS — R51 Headache: Secondary | ICD-10-CM | POA: Insufficient documentation

## 2016-10-30 DIAGNOSIS — F17210 Nicotine dependence, cigarettes, uncomplicated: Secondary | ICD-10-CM | POA: Diagnosis not present

## 2016-10-30 DIAGNOSIS — Z79899 Other long term (current) drug therapy: Secondary | ICD-10-CM | POA: Insufficient documentation

## 2016-10-30 DIAGNOSIS — Z794 Long term (current) use of insulin: Secondary | ICD-10-CM | POA: Diagnosis not present

## 2016-10-30 DIAGNOSIS — E860 Dehydration: Secondary | ICD-10-CM | POA: Diagnosis not present

## 2016-10-30 DIAGNOSIS — R519 Headache, unspecified: Secondary | ICD-10-CM

## 2016-10-30 LAB — URINALYSIS, ROUTINE W REFLEX MICROSCOPIC
BACTERIA UA: NONE SEEN
BILIRUBIN URINE: NEGATIVE
Glucose, UA: NEGATIVE mg/dL
Hgb urine dipstick: NEGATIVE
KETONES UR: NEGATIVE mg/dL
Nitrite: NEGATIVE
PROTEIN: 30 mg/dL — AB
Specific Gravity, Urine: 1.021 (ref 1.005–1.030)
pH: 5 (ref 5.0–8.0)

## 2016-10-30 LAB — COMPREHENSIVE METABOLIC PANEL
ALBUMIN: 4 g/dL (ref 3.5–5.0)
ALK PHOS: 91 U/L (ref 38–126)
ALT: 54 U/L (ref 17–63)
ANION GAP: 13 (ref 5–15)
AST: 30 U/L (ref 15–41)
BUN: 28 mg/dL — ABNORMAL HIGH (ref 6–20)
CALCIUM: 9.2 mg/dL (ref 8.9–10.3)
CO2: 24 mmol/L (ref 22–32)
Chloride: 99 mmol/L — ABNORMAL LOW (ref 101–111)
Creatinine, Ser: 1.85 mg/dL — ABNORMAL HIGH (ref 0.61–1.24)
GFR calc Af Amer: 51 mL/min — ABNORMAL LOW (ref >60)
GFR calc non Af Amer: 44 mL/min — ABNORMAL LOW (ref >60)
GLUCOSE: 119 mg/dL — AB (ref 65–99)
Potassium: 3.3 mmol/L — ABNORMAL LOW (ref 3.5–5.1)
SODIUM: 136 mmol/L (ref 135–145)
Total Bilirubin: 0.4 mg/dL (ref 0.3–1.2)
Total Protein: 6.9 g/dL (ref 6.5–8.1)

## 2016-10-30 LAB — CBC WITH DIFFERENTIAL/PLATELET
BASOS PCT: 0 %
Basophils Absolute: 0 10*3/uL (ref 0.0–0.1)
EOS ABS: 0.5 10*3/uL (ref 0.0–0.7)
Eosinophils Relative: 6 %
HCT: 41.3 % (ref 39.0–52.0)
HEMOGLOBIN: 14.5 g/dL (ref 13.0–17.0)
Lymphocytes Relative: 27 %
Lymphs Abs: 2.5 10*3/uL (ref 0.7–4.0)
MCH: 29.9 pg (ref 26.0–34.0)
MCHC: 35.1 g/dL (ref 30.0–36.0)
MCV: 85.2 fL (ref 78.0–100.0)
MONOS PCT: 8 %
Monocytes Absolute: 0.7 10*3/uL (ref 0.1–1.0)
NEUTROS PCT: 59 %
Neutro Abs: 5.5 10*3/uL (ref 1.7–7.7)
Platelets: 283 10*3/uL (ref 150–400)
RBC: 4.85 MIL/uL (ref 4.22–5.81)
RDW: 12.1 % (ref 11.5–15.5)
WBC: 9.3 10*3/uL (ref 4.0–10.5)

## 2016-10-30 MED ORDER — SODIUM CHLORIDE 0.9 % IV BOLUS (SEPSIS)
1000.0000 mL | Freq: Once | INTRAVENOUS | Status: AC
  Administered 2016-10-30: 1000 mL via INTRAVENOUS

## 2016-10-30 NOTE — ED Notes (Signed)
Patient reminded that a urine specimen is still needed at this time.

## 2016-10-30 NOTE — ED Triage Notes (Signed)
Pt reports he has been dizzy for the last 2 days -   Dr Meda Coffee is PCP
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
## 2016-10-30 NOTE — ED Triage Notes (Signed)
Pt c/o headache with dizziness and nausea x 2 days.

## 2016-10-31 LAB — I-STAT CREATININE, ED: CREATININE: 1.1 mg/dL (ref 0.61–1.24)

## 2016-10-31 MED ORDER — HYDROCODONE-ACETAMINOPHEN 5-325 MG PO TABS
1.0000 | ORAL_TABLET | Freq: Once | ORAL | Status: AC
  Administered 2016-10-31: 1 via ORAL
  Filled 2016-10-31: qty 1

## 2016-10-31 MED ORDER — SODIUM CHLORIDE 0.9 % IV BOLUS (SEPSIS)
1000.0000 mL | Freq: Once | INTRAVENOUS | Status: AC
  Administered 2016-10-31: 1000 mL via INTRAVENOUS

## 2016-10-31 MED ORDER — FAMOTIDINE 20 MG PO TABS
40.0000 mg | ORAL_TABLET | Freq: Once | ORAL | Status: AC
  Administered 2016-10-31: 40 mg via ORAL
  Filled 2016-10-31: qty 2

## 2016-10-31 NOTE — Discharge Instructions (Signed)
As discussed, your labs and symptoms suggest dehydration, at least partially from the heat of your job, but probably worsened by the day of diarrhea you had yesterday.  The diarrhea may have been triggered by your increased dosing of metformin - I suggest discussing this with your doctor before returning to this new dose.  Your creatinine here was 1.85 initially (normal range should be less than 1.24) but has improved to normal range after receiving IV fluids.  This is a reflection of your kidney function being reduced from being dehydrated.  Please have this rechecked on Monday as discussed if your continue to feel unwell.

## 2016-11-02 ENCOUNTER — Telehealth: Payer: Self-pay | Admitting: *Deleted

## 2016-11-02 ENCOUNTER — Ambulatory Visit: Payer: Medicaid Other | Admitting: Family Medicine

## 2016-11-02 NOTE — Telephone Encounter (Signed)
Patient left message on nurses line stating he needs to see Dr Meda Coffee and he went to the ER over the weekend and he is now thinking the medication that Dr Meda Coffee has put him on is making him sick. I seen where patient was scheduled for today at 3:20 for blurry vision he was also seen last week 6/19 for this problem. I tried to call patient back no answer left a message.

## 2016-11-02 NOTE — Telephone Encounter (Signed)
I reviewed the ER note.  It says he had nausea and diarrhea.  These can be side effects from the metformin.  Would go back down to one a day.  Check blood sugars twice a day.  See me in a week or two

## 2016-11-03 ENCOUNTER — Other Ambulatory Visit: Payer: Self-pay

## 2016-11-03 MED ORDER — GLUCOSE BLOOD VI STRP: ORAL_STRIP | 12 refills | Status: DC

## 2016-11-03 MED ORDER — IBUPROFEN 800 MG PO TABS: 800.0000 mg | ORAL_TABLET | Freq: Three times a day (TID) | ORAL | 0 refills | Status: DC | PRN

## 2016-11-03 MED ORDER — SULFAMETHOXAZOLE-TRIMETHOPRIM 800-160 MG PO TABS: 1.0000 | ORAL_TABLET | Freq: Two times a day (BID) | ORAL | 0 refills | Status: DC

## 2016-11-03 NOTE — Telephone Encounter (Signed)
Patient left message on nurses line stating he requested a refill on his medications and the pharmacy has not received them yet. Please call patient [phone]

## 2016-11-03 NOTE — Telephone Encounter (Signed)
Called pt, aware rxs sent in

## 2016-11-03 NOTE — Telephone Encounter (Signed)
He may have refill antibiotic and ibuprofen 800 for pain

## 2016-11-03 NOTE — Telephone Encounter (Signed)
Called and spoke to Vernel, aware to reduce metformin and ck bs bid. Asking for abx for an abscess under his arm and pain med. Do you want to rx, or have him come in Thursday am?

## 2016-11-13 ENCOUNTER — Encounter: Payer: Self-pay | Admitting: Family Medicine

## 2016-11-13 ENCOUNTER — Ambulatory Visit (INDEPENDENT_AMBULATORY_CARE_PROVIDER_SITE_OTHER): Payer: Medicaid Other | Admitting: Family Medicine

## 2016-11-13 VITALS — BP 110/60 | HR 88 | Temp 96.4°F | Resp 18 | Ht 71.0 in | Wt 228.1 lb

## 2016-11-13 DIAGNOSIS — Z720 Tobacco use: Secondary | ICD-10-CM

## 2016-11-13 DIAGNOSIS — E119 Type 2 diabetes mellitus without complications: Secondary | ICD-10-CM

## 2016-11-13 DIAGNOSIS — L301 Dyshidrosis [pompholyx]: Secondary | ICD-10-CM

## 2016-11-13 DIAGNOSIS — I10 Essential (primary) hypertension: Secondary | ICD-10-CM | POA: Diagnosis not present

## 2016-11-13 MED ORDER — VARENICLINE TARTRATE 1 MG PO TABS: 1.0000 mg | ORAL_TABLET | Freq: Two times a day (BID) | ORAL | 2 refills | Status: DC

## 2016-11-13 MED ORDER — VARENICLINE TARTRATE 0.5 MG X 11 & 1 MG X 42 PO MISC: ORAL | 0 refills | Status: DC

## 2016-11-13 NOTE — Addendum Note (Signed)
Addended by: Ova Freshwater on: 11/13/2016 11:30 AM   Modules accepted: Orders

## 2016-11-13 NOTE — Addendum Note (Signed)
Addended by: Lysle Morales on: 11/13/2016 08:49 AM   Modules accepted: Orders

## 2016-11-13 NOTE — Progress Notes (Signed)
Chief Complaint  Patient presents with  . Follow-up    1 month   Patient states he is doing better with his diabetes He is not keeping a log of sugars and is reminded again to do so.  He is advised he needs at lease a week of blood sugar measurements to take to his diabetes visit next week.  Needs to take his glucose meter. He has had trouble since increasing the metformin.  He has had spells of low blood sugar at work, needing to sit down when exerting himself in the heat.  A note is given to him for his employer.  I am reducing his lantus until we can further evaluate with an accurate sugar log. BP is good Still smoking.  Has been successful reducing on the patches, but not quitting.  Today we had a discussion about the importance of quitting, the methods, and the use of chantix.  We talked about potential side effects, and expectations on the drug. His eczema is better - dry and peeling - but no active vesicles or itching He is NOT drinking alcohol  Patient Active Problem List   Diagnosis Date Noted  . Dyshidrotic eczema 10/27/2016  . Type 2 diabetes mellitus without complications ([place]) 25/36/6440  . Essential hypertension 10/14/2016  . Abscess, axilla 10/06/2016  . Tobacco abuse 10/06/2016  . Alcohol dependence ([place]) 10/06/2016    Outpatient Encounter Prescriptions as of 11/13/2016  Medication Sig  . glucose blood test strip Use as instructed  . ibuprofen (ADVIL,MOTRIN) 800 MG tablet Take 1 tablet (800 mg total) by mouth every 8 (eight) hours as needed.  . Insulin Glargine (LANTUS) 100 UNIT/ML Solostar Pen Inject 30 Units into the skin daily at 10 pm.  . lisinopril-hydrochlorothiazide (PRINZIDE,ZESTORETIC) 20-25 MG tablet take 1 tablet by mouth daily  . metFORMIN (GLUCOPHAGE) 1000 MG tablet Take 1 tablet (1,000 mg total) by mouth 2 (two) times daily with a meal. (Patient taking differently: Take 500 mg by mouth 2 (two) times daily with a meal. )   No facility-administered  encounter medications on file as of 11/13/2016.     No Known Allergies  Review of Systems  Constitutional: Positive for diaphoresis. Negative for activity change and appetite change.       When sugar low  HENT: Negative for congestion and dental problem.   Eyes: Negative for photophobia and visual disturbance.  Respiratory: Negative for shortness of breath and wheezing.   Cardiovascular: Negative for chest pain, palpitations and leg swelling.  Gastrointestinal: Negative for diarrhea, nausea and vomiting.  Endocrine: Negative for polydipsia and polyuria.  Genitourinary: Negative for difficulty urinating and frequency.  Musculoskeletal: Negative for arthralgias and back pain.  Neurological: Positive for light-headedness. Negative for headaches.  Psychiatric/Behavioral: Negative.  Negative for sleep disturbance. The patient is not nervous/anxious.     BP 110/60 (BP Location: Right Arm, Patient Position: Sitting, Cuff Size: Large)   Pulse 88   Temp (!) 96.4 F (35.8 C) (Temporal)   Resp 18   Ht 5' 11"  (1.803 m)   Wt 228 lb 1.9 oz (103.5 kg)   SpO2 98%   BMI 31.82 kg/m   Physical Exam  Constitutional: He is oriented to person, place, and time. He appears well-developed and well-nourished.  HENT:  Head: Normocephalic and atraumatic.  Mouth/Throat: Oropharynx is clear and moist.  Eyes: Conjunctivae are normal. Pupils are equal, round, and reactive to light.  Neck: Normal range of motion. Neck supple. No thyromegaly present.  Cardiovascular:  Normal rate, regular rhythm and normal heart sounds.   Pulmonary/Chest: Effort normal and breath sounds normal. No respiratory distress.  Musculoskeletal: Normal range of motion. He exhibits no edema.  Lymphadenopathy:    He has no cervical adenopathy.  Neurological: He is alert and oriented to person, place, and time.  Gait normal  Skin: Skin is warm and dry.  Peeling skin hands  Psychiatric: He has a normal mood and affect. His behavior is  normal. Thought content normal.  Nursing note and vitals reviewed.   ASSESSMENT/PLAN:  1. Type 2 diabetes mellitus without complication, unspecified whether long term insulin use ([place]) improving 2. Essential hypertension controlled 3. Dyshidrotic eczema improved 4. Tobacco abuse Improved - but not quit.  Will give Rx for chantix   Patient Instructions  Drop the insulin to 25 u a day Drink plenty of water Check your sugar when you feel weak or dizzy Labs end of August See me after labs     Raylene Everts, MD

## 2016-11-13 NOTE — Patient Instructions (Signed)
Drop the insulin to 25 u a day Drink plenty of water Check your sugar when you feel weak or dizzy Labs end of August See me after labs

## 2016-11-17 ENCOUNTER — Ambulatory Visit: Payer: Medicaid Other | Admitting: Family Medicine

## 2016-11-19 ENCOUNTER — Encounter: Payer: Medicaid Other | Attending: Family Medicine | Admitting: Nutrition

## 2016-11-19 VITALS — Ht 71.0 in | Wt 229.0 lb

## 2016-11-19 DIAGNOSIS — E118 Type 2 diabetes mellitus with unspecified complications: Secondary | ICD-10-CM

## 2016-11-19 DIAGNOSIS — E1165 Type 2 diabetes mellitus with hyperglycemia: Secondary | ICD-10-CM

## 2016-11-19 DIAGNOSIS — E1110 Type 2 diabetes mellitus with ketoacidosis without coma: Secondary | ICD-10-CM | POA: Insufficient documentation

## 2016-11-19 DIAGNOSIS — IMO0002 Reserved for concepts with insufficient information to code with codable children: Secondary | ICD-10-CM

## 2016-11-19 DIAGNOSIS — Z713 Dietary counseling and surveillance: Secondary | ICD-10-CM | POA: Diagnosis not present

## 2016-11-19 DIAGNOSIS — E669 Obesity, unspecified: Secondary | ICD-10-CM

## 2016-11-19 DIAGNOSIS — Z794 Long term (current) use of insulin: Secondary | ICD-10-CM

## 2016-11-19 NOTE — Progress Notes (Signed)
Diabetes Self-Management Education  Visit Type: (P) First/Initial  Appt. Start Time: 845 Appt. End Time: 945  11/19/2016  [name], identified by name and date of birth, is a 41 y.o. male with a diagnosis of Diabetes: (P) Type 2. He works 2nd shift. Was in hospital 1 months ago with DKA, newly diagnosed with Type 2 DM. A1C was 14.5% in hospital. Had a strong history of alcohol abuse and has since stopped all alcohol. Complains of hyper/hypoglycemia symptoms on regular basis. First time he has ever seen an RDN/CDE. Appreciated the information.   Highly motivated to make improvements with his diet and meal planning/exercise. Taking 15 units of Levemir daily and 500 mg of Metformin BID. Was only eating 2 meals per day a snacks occasionally.       Diet is inconsistent in CHO causing hyper/hypoglycemia.  Lab Results  Component Value Date   HGBA1C 14.5 (H) 10/06/2016   CMP Latest Ref Rng & Units 10/31/2016 10/30/2016 10/07/2016  Glucose 65 - 99 mg/dL - 119(H) 248(H)  BUN 6 - 20 mg/dL - 28(H) 16  Creatinine 0.61 - 1.24 mg/dL 1.10 1.85(H) 0.89  Sodium 135 - 145 mmol/L - 136 130(L)  Potassium 3.5 - 5.1 mmol/L - 3.3(L) 3.0(L)  Chloride 101 - 111 mmol/L - 99(L) 95(L)  CO2 22 - 32 mmol/L - 24 26  Calcium 8.9 - 10.3 mg/dL - 9.2 8.8(L)  Total Protein 6.5 - 8.1 g/dL - 6.9 -  Total Bilirubin 0.3 - 1.2 mg/dL - 0.4 -  Alkaline Phos 38 - 126 U/L - 91 -  AST 15 - 41 U/L - 30 -  ALT 17 - 63 U/L - 54 -      ASSESSMENT  Height 5' 11"  (1.803 m), weight 229 lb (103.9 kg). Body mass index is 31.94 kg/m.      Diabetes Self-Management Education - 11/19/16 0844      Visit Information   Visit Type (P)  First/Initial     Initial Visit   Diabetes Type (P)  Type 2   Are you currently following a meal plan? (P)  No   Are you taking your medications as prescribed? (P)  Yes   Date Diagnosed (P)  JUne 2018     Health Coping   How would you rate your overall health? (P)  Good     Psychosocial  Assessment   Patient Belief/Attitude about Diabetes (P)  Motivated to manage diabetes   Self-management support (P)  Family   Other persons present (P)  Patient     Complications   Fasting Blood glucose range (mg/dL) (P)  130-179   Postprandial Blood glucose range (mg/dL) (P)  180-200   Number of hypoglycemic episodes per month (P)  0   Have you had a dilated eye exam in the past 12 months? (P)  No   Have you had a dental exam in the past 12 months? (P)  No   Are you checking your feet? (P)  Yes   How many days per week are you checking your feet? (P)  7     Dietary Intake   Breakfast (P)  2 [place] sausage, eggs, milk    Snack (morning) (P)  water   Lunch (P)  2 pc baked chicken, mashed potatoes and broccoli, DT Mt Dew   Snack (afternoon) (P)  water   Dinner (P)  same as dinner, Dt [employer] (evening) (P)  Hamburger on bun, water   Beverage(s) (  P)  water     Exercise   Exercise Type (P)  ADL's      Individualized Plan for Diabetes Self-Management Training:   Learning Objective:  Patient will have a greater understanding of diabetes self-management. Patient education plan is to attend individual and/or group sessions per assessed needs and concerns.   Plan:   Patient Instructions  Goals Follow Plate Method Eat meals on time Cut out diet sodas and drink water only Don't skip meals Take meds as prescribed Eat 60-75 grams of carbs per meal Snack only on veggies or protein Test blood sugarrs twice a day.    Expected Outcomes:    Improved knowledge for better self management of his diabetes and reduced risk of complications.  Education material provided: Living Well with Diabetes, Food label handouts, A1C conversion sheet, Meal plan card, My Plate and Carbohydrate counting sheet  If problems or questions, patient to contact team via:  Phone and Email  Future DSME appointment:   Follow up 1 month

## 2016-11-19 NOTE — Patient Instructions (Signed)
Goals Follow Plate Method Eat meals on time Cut out diet sodas and drink water only Don't skip meals Take meds as prescribed Eat 60-75 grams of carbs per meal Snack only on veggies or protein Test blood sugarrs twice a day.

## 2016-11-29 ENCOUNTER — Other Ambulatory Visit: Payer: Self-pay | Admitting: Family Medicine

## 2016-11-30 ENCOUNTER — Ambulatory Visit: Payer: Medicaid Other | Admitting: Nutrition

## 2016-11-30 NOTE — Telephone Encounter (Signed)
Seen 7 6 18

## 2016-12-21 ENCOUNTER — Ambulatory Visit: Payer: Medicaid Other | Admitting: Nutrition

## 2016-12-22 ENCOUNTER — Ambulatory Visit (INDEPENDENT_AMBULATORY_CARE_PROVIDER_SITE_OTHER): Payer: Medicaid Other | Admitting: "Endocrinology

## 2016-12-22 ENCOUNTER — Encounter: Payer: Self-pay | Admitting: "Endocrinology

## 2016-12-22 VITALS — BP 131/83 | HR 79 | Ht 71.0 in | Wt 233.0 lb

## 2016-12-22 DIAGNOSIS — E6609 Other obesity due to excess calories: Secondary | ICD-10-CM

## 2016-12-22 DIAGNOSIS — Z6832 Body mass index (BMI) 32.0-32.9, adult: Secondary | ICD-10-CM

## 2016-12-22 DIAGNOSIS — E119 Type 2 diabetes mellitus without complications: Secondary | ICD-10-CM

## 2016-12-22 DIAGNOSIS — I10 Essential (primary) hypertension: Secondary | ICD-10-CM | POA: Diagnosis not present

## 2016-12-22 DIAGNOSIS — Z794 Long term (current) use of insulin: Secondary | ICD-10-CM

## 2016-12-22 DIAGNOSIS — E669 Obesity, unspecified: Secondary | ICD-10-CM | POA: Insufficient documentation
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
## 2017-01-13 ENCOUNTER — Ambulatory Visit: Payer: Medicaid Other | Admitting: "Endocrinology

## 2017-01-21 ENCOUNTER — Encounter: Payer: Medicaid Other | Attending: Family Medicine | Admitting: Nutrition

## 2017-01-21 VITALS — Ht 71.0 in | Wt 240.0 lb

## 2017-01-21 DIAGNOSIS — Z794 Long term (current) use of insulin: Secondary | ICD-10-CM | POA: Insufficient documentation

## 2017-01-21 DIAGNOSIS — E119 Type 2 diabetes mellitus without complications: Secondary | ICD-10-CM | POA: Diagnosis present

## 2017-01-21 DIAGNOSIS — E118 Type 2 diabetes mellitus with unspecified complications: Secondary | ICD-10-CM

## 2017-01-21 DIAGNOSIS — IMO0002 Reserved for concepts with insufficient information to code with codable children: Secondary | ICD-10-CM

## 2017-01-21 DIAGNOSIS — E1165 Type 2 diabetes mellitus with hyperglycemia: Secondary | ICD-10-CM

## 2017-01-21 DIAGNOSIS — E669 Obesity, unspecified: Secondary | ICD-10-CM

## 2017-01-21 NOTE — Patient Instructions (Addendum)
Goals Eat 3-4 carb choices with breakfast Make sure getting veggies with lunch and dinner Increase veggies for snacks while watching football Increase exercise for needed weight loss.

## 2017-01-21 NOTE — Progress Notes (Signed)
Diabetes Self-Management Education  Visit Type: Follow-up  Appt. Start Time: 1000 Appt. End Time: 1030 01/21/2017  [name], identified by name and date of birth, is a 41 y.o. male with a diagnosis of Diabetes Type 2.  He notes he is doing much better. Trying to eat better balanced meals. Feels a lot better. Needs to exercise. Gained 10 lbs since last visit. BS are doing better. He notes is BS in the low 100's now. He know he needs to make follow up appt with Dr. Moshe Malone. Sees Dr. Dorris Malone for Endocrinology. 20 units Lantus daily and Metformin 500 mg BID.   He has been taking his meals to work, eating more salads and better balanced meals. Drinking mostly water and some gatorade at times.   Will get A1C at next visit with Dr. Dorris Malone. Making great progress. No low blood sugars.  Lab Results  Component Value Date   HGBA1C 14.5 (H) 10/06/2016   CMP Latest Ref Rng & Units 10/31/2016 10/30/2016 10/07/2016  Glucose 65 - 99 mg/dL - 119(H) 248(H)  BUN 6 - 20 mg/dL - 28(H) 16  Creatinine 0.61 - 1.24 mg/dL 1.10 1.85(H) 0.89  Sodium 135 - 145 mmol/L - 136 130(L)  Potassium 3.5 - 5.1 mmol/L - 3.3(L) 3.0(L)  Chloride 101 - 111 mmol/L - 99(L) 95(L)  CO2 22 - 32 mmol/L - 24 26  Calcium 8.9 - 10.3 mg/dL - 9.2 8.8(L)  Total Protein 6.5 - 8.1 g/dL - 6.9 -  Total Bilirubin 0.3 - 1.2 mg/dL - 0.4 -  Alkaline Phos 38 - 126 U/L - 91 -  AST 15 - 41 U/L - 30 -  ALT 17 - 63 U/L - 54 -      ASSESSMENT  Height 5' 11"  (1.803 m), weight 240 lb (108.9 kg). Body mass index is 33.47 kg/m.      Diabetes Self-Management Education - 01/21/17 1013      Visit Information   Visit Type Follow-up     Health Coping   How would you rate your overall health? Good     Complications   How often do you check your blood sugar? 1-2 times/day   Fasting Blood glucose range (mg/dL) 130-179   Number of hypoglycemic episodes per month 0     Dietary Intake   Breakfast 10 am:  Toss salad with chicken or salmon     Lunch [employer] salad with 6 in sub,  Gatorade   Dinner Spaghetti 1-2 cups,    Beverage(s) water, gatoarde.     Subsequent Visit   Since your last visit have you continued or begun to take your medications as prescribed? Yes      Individualized Plan for Diabetes Self-Management Training:   Learning Objective:  Patient will have a greater understanding of diabetes self-management. Patient education plan is to attend individual and/or group sessions per assessed needs and concerns.   Plan:   Patient Instructions  Goals Eat 3-4 carb choices with breakfast Make sure getting veggies with lunch and dinner Increase veggies for snacks while watching football Increase exercise for needed weight loss.  Expected Outcomes:    Improved knowledge for better self management of his diabetes and reduced risk of complications.  Education material provided: Living Well with Diabetes, Food label handouts, A1C conversion sheet, Meal plan card, My Plate and Carbohydrate counting sheet  If problems or questions, patient to contact team via:  Phone and Email  Future DSME appointment:   Follow up 3 month

## 2017-01-26 ENCOUNTER — Telehealth: Payer: Self-pay

## 2017-01-26 NOTE — Telephone Encounter (Signed)
Patient called requesting blood pressure medication, needles and pen refill.  No other details given.

## 2017-01-26 NOTE — Telephone Encounter (Signed)
Patient left message that [facility] is pharmacy

## 2017-01-27 MED ORDER — LISINOPRIL-HYDROCHLOROTHIAZIDE 20-25 MG PO TABS: 1.0000 | ORAL_TABLET | Freq: Every day | ORAL | 3 refills | Status: DC

## 2017-01-27 MED ORDER — INSULIN GLARGINE 100 UNIT/ML SOLOSTAR PEN: 20.0000 [IU] | PEN_INJECTOR | Freq: Every day | SUBCUTANEOUS | 3 refills | Status: DC

## 2017-01-27 MED ORDER — PEN NEEDLES 32G X 6 MM MISC: 1.0000 | Freq: Every day | 3 refills | Status: DC

## 2017-02-08 ENCOUNTER — Ambulatory Visit: Payer: Medicaid Other | Admitting: "Endocrinology

## 2017-02-08 ENCOUNTER — Ambulatory Visit: Payer: Medicaid Other | Admitting: Nutrition

## 2017-02-09 LAB — MICROALBUMIN / CREATININE URINE RATIO
Creatinine, Urine: 228 mg/dL (ref 20–320)
MICROALB UR: 0.4 mg/dL
MICROALB/CREAT RATIO: 2 ug/mg{creat} (ref <30)

## 2017-02-09 LAB — RENAL FUNCTION PANEL
ALBUMIN MSPROF: 4.3 g/dL (ref 3.6–5.1)
BUN: 11 mg/dL (ref 7–25)
CALCIUM: 9.7 mg/dL (ref 8.6–10.3)
CO2: 29 mmol/L (ref 20–32)
Chloride: 100 mmol/L (ref 98–110)
Creat: 1.02 mg/dL (ref 0.60–1.35)
GLUCOSE: 132 mg/dL — AB (ref 65–99)
PHOSPHORUS: 3.4 mg/dL (ref 2.5–4.5)
Potassium: 4.6 mmol/L (ref 3.5–5.3)
Sodium: 135 mmol/L (ref 135–146)

## 2017-02-09 LAB — LIPID PANEL
CHOLESTEROL: 163 mg/dL (ref <200)
HDL: 38 mg/dL — AB (ref >40)
LDL CHOLESTEROL (CALC): 108 mg/dL — AB
Non-HDL Cholesterol (Calc): 125 mg/dL (calc) (ref <130)
TRIGLYCERIDES: 81 mg/dL (ref <150)
Total CHOL/HDL Ratio: 4.3 (calc) (ref <5.0)

## 2017-02-09 LAB — VITAMIN D 25 HYDROXY (VIT D DEFICIENCY, FRACTURES): Vit D, 25-Hydroxy: 22 ng/mL — ABNORMAL LOW (ref 30–100)

## 2017-02-09 LAB — HEMOGLOBIN A1C
Hgb A1c MFr Bld: 5.9 % of total Hgb — ABNORMAL HIGH (ref <5.7)
MEAN PLASMA GLUCOSE: 123 (calc)
eAG (mmol/L): 6.8 (calc)

## 2017-02-09 LAB — T4, FREE: FREE T4: 1.3 ng/dL (ref 0.8–1.8)

## 2017-02-09 LAB — TSH: TSH: 2 mIU/L (ref 0.40–4.50)

## 2017-03-09 ENCOUNTER — Ambulatory Visit: Payer: Medicaid Other | Admitting: "Endocrinology

## 2017-03-09 ENCOUNTER — Ambulatory Visit: Payer: Medicaid Other | Admitting: Nutrition

## 2017-06-09 ENCOUNTER — Encounter (HOSPITAL_COMMUNITY): Payer: Self-pay

## 2017-06-09 ENCOUNTER — Emergency Department (HOSPITAL_COMMUNITY): Payer: Medicaid Other

## 2017-06-09 ENCOUNTER — Emergency Department (HOSPITAL_COMMUNITY)
Admission: EM | Admit: 2017-06-09 | Discharge: 2017-06-09 | Disposition: A | Discharge status: Home / Self Care | Payer: Medicaid Other | Attending: Emergency Medicine | Admitting: Emergency Medicine

## 2017-06-09 DIAGNOSIS — Y92002 Bathroom of unspecified non-institutional (private) residence single-family (private) house as the place of occurrence of the external cause: Secondary | ICD-10-CM | POA: Diagnosis not present

## 2017-06-09 DIAGNOSIS — Y999 Unspecified external cause status: Secondary | ICD-10-CM | POA: Diagnosis not present

## 2017-06-09 DIAGNOSIS — W01198A Fall on same level from slipping, tripping and stumbling with subsequent striking against other object, initial encounter: Secondary | ICD-10-CM | POA: Diagnosis not present

## 2017-06-09 DIAGNOSIS — Y939 Activity, unspecified: Secondary | ICD-10-CM | POA: Diagnosis not present

## 2017-06-09 DIAGNOSIS — Z79899 Other long term (current) drug therapy: Secondary | ICD-10-CM | POA: Diagnosis not present

## 2017-06-09 DIAGNOSIS — F17210 Nicotine dependence, cigarettes, uncomplicated: Secondary | ICD-10-CM | POA: Diagnosis not present

## 2017-06-09 DIAGNOSIS — Z794 Long term (current) use of insulin: Secondary | ICD-10-CM | POA: Insufficient documentation

## 2017-06-09 DIAGNOSIS — E119 Type 2 diabetes mellitus without complications: Secondary | ICD-10-CM | POA: Insufficient documentation

## 2017-06-09 DIAGNOSIS — I10 Essential (primary) hypertension: Secondary | ICD-10-CM | POA: Insufficient documentation

## 2017-06-09 DIAGNOSIS — S20212A Contusion of left front wall of thorax, initial encounter: Secondary | ICD-10-CM | POA: Diagnosis not present

## 2017-06-09 DIAGNOSIS — J069 Acute upper respiratory infection, unspecified: Secondary | ICD-10-CM

## 2017-06-09 DIAGNOSIS — S299XXA Unspecified injury of thorax, initial encounter: Secondary | ICD-10-CM | POA: Diagnosis present

## 2017-06-09 MED ORDER — IBUPROFEN 800 MG PO TABS
800.0000 mg | ORAL_TABLET | Freq: Once | ORAL | Status: AC
  Administered 2017-06-09: 800 mg via ORAL
  Filled 2017-06-09: qty 1

## 2017-06-09 MED ORDER — LORATADINE-PSEUDOEPHEDRINE ER 5-120 MG PO TB12: 1.0000 | ORAL_TABLET | Freq: Two times a day (BID) | ORAL | 0 refills | Status: DC

## 2017-06-09 MED ORDER — IBUPROFEN 600 MG PO TABS: 600.0000 mg | ORAL_TABLET | Freq: Four times a day (QID) | ORAL | 0 refills | Status: DC

## 2017-06-09 MED ORDER — CYCLOBENZAPRINE HCL 10 MG PO TABS: 10.0000 mg | ORAL_TABLET | Freq: Three times a day (TID) | ORAL | 0 refills | Status: DC

## 2017-06-09 MED ORDER — CYCLOBENZAPRINE HCL 10 MG PO TABS
10.0000 mg | ORAL_TABLET | Freq: Once | ORAL | Status: AC
  Administered 2017-06-09: 10 mg via ORAL
  Filled 2017-06-09: qty 1

## 2017-06-09 NOTE — ED Notes (Signed)
Pt also c/o feeling like left ear is clogged.

## 2017-06-09 NOTE — Discharge Instructions (Signed)
Your vital signs within normal limits.  Your oxygen level is 99% on room air.  The x-ray of your chest and ribs are negative for acute problems.  In particular no visual fracture at this time.  Please practice taking deep breaths.  Please use Flexeril 3 times daily and ibuprofen 4 times daily for soreness.  Flexeril may cause drowsiness.  Please do not drive, operate machinery, drink alcohol, or participate in activities requiring concentration when taking this medication.  Your examination suggest an upper respiratory infection with sinus congestion, and fluid behind your eardrum.  This will improve as your upper respiratory infection improves.  Please use Claritin-D for symptoms.  Use Tylenol every 4 hours or ibuprofen every 6 hours if needed for discomfort, and/or for fever.  Please see Dr. Meda Coffee, or return to the emergency department if you are coughing up blood, having difficulty with your breathing or speaking.  Also if you notice high fever, purulent drainage from your nasal passages, or fever that will not improve with Tylenol and ibuprofen please see your primary physician or return to the emergency department.
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
## 2017-06-09 NOTE — ED Notes (Signed)
Pt taken to xray 

## 2017-06-09 NOTE — ED Triage Notes (Signed)
Pt reports slipped on some water in the floor sat and hit left ribs on tub.  Has been taking ibuprofen with some relief but pain got worse last night.  Says hurts to take a deep breath.

## 2017-06-29 DIAGNOSIS — J069 Acute upper respiratory infection, unspecified: Secondary | ICD-10-CM | POA: Diagnosis not present

## 2017-06-29 DIAGNOSIS — H6693 Otitis media, unspecified, bilateral: Secondary | ICD-10-CM | POA: Diagnosis not present

## 2017-07-19 ENCOUNTER — Encounter: Payer: Self-pay | Admitting: Family Medicine

## 2017-08-02 ENCOUNTER — Ambulatory Visit: Payer: Medicaid Other | Admitting: Family Medicine

## 2017-11-16 DIAGNOSIS — M26629 Arthralgia of temporomandibular joint, unspecified side: Secondary | ICD-10-CM | POA: Insufficient documentation

## 2017-11-16 DIAGNOSIS — R0683 Snoring: Secondary | ICD-10-CM | POA: Insufficient documentation

## 2017-11-16 DIAGNOSIS — H6523 Chronic serous otitis media, bilateral: Secondary | ICD-10-CM | POA: Insufficient documentation

## 2017-11-16 DIAGNOSIS — H900 Conductive hearing loss, bilateral: Secondary | ICD-10-CM | POA: Insufficient documentation

## 2017-11-28 ENCOUNTER — Encounter (HOSPITAL_COMMUNITY): Payer: Self-pay | Admitting: Emergency Medicine

## 2017-11-28 ENCOUNTER — Other Ambulatory Visit: Payer: Self-pay

## 2017-11-28 ENCOUNTER — Emergency Department (HOSPITAL_COMMUNITY)
Admission: EM | Admit: 2017-11-28 | Discharge: 2017-11-28 | Disposition: A | Discharge status: Home / Self Care | Payer: Medicaid Other | Attending: Emergency Medicine | Admitting: Emergency Medicine

## 2017-11-28 DIAGNOSIS — Y999 Unspecified external cause status: Secondary | ICD-10-CM | POA: Diagnosis not present

## 2017-11-28 DIAGNOSIS — Z794 Long term (current) use of insulin: Secondary | ICD-10-CM | POA: Insufficient documentation

## 2017-11-28 DIAGNOSIS — E119 Type 2 diabetes mellitus without complications: Secondary | ICD-10-CM | POA: Insufficient documentation

## 2017-11-28 DIAGNOSIS — I10 Essential (primary) hypertension: Secondary | ICD-10-CM | POA: Diagnosis not present

## 2017-11-28 DIAGNOSIS — L02214 Cutaneous abscess of groin: Secondary | ICD-10-CM | POA: Insufficient documentation

## 2017-11-28 DIAGNOSIS — Y929 Unspecified place or not applicable: Secondary | ICD-10-CM | POA: Diagnosis not present

## 2017-11-28 DIAGNOSIS — S90822A Blister (nonthermal), left foot, initial encounter: Secondary | ICD-10-CM | POA: Diagnosis present

## 2017-11-28 DIAGNOSIS — Y939 Activity, unspecified: Secondary | ICD-10-CM | POA: Insufficient documentation

## 2017-11-28 DIAGNOSIS — X58XXXA Exposure to other specified factors, initial encounter: Secondary | ICD-10-CM | POA: Insufficient documentation

## 2017-11-28 DIAGNOSIS — F17210 Nicotine dependence, cigarettes, uncomplicated: Secondary | ICD-10-CM | POA: Insufficient documentation

## 2017-11-28 DIAGNOSIS — S90821A Blister (nonthermal), right foot, initial encounter: Secondary | ICD-10-CM | POA: Insufficient documentation

## 2017-11-28 DIAGNOSIS — Z79899 Other long term (current) drug therapy: Secondary | ICD-10-CM | POA: Diagnosis not present

## 2017-11-28 LAB — CBG MONITORING, ED: Glucose-Capillary: 258 mg/dL — ABNORMAL HIGH (ref 70–99)

## 2017-11-28 MED ORDER — TRAMADOL HCL 50 MG PO TABS: 50.0000 mg | ORAL_TABLET | Freq: Four times a day (QID) | ORAL | 0 refills | Status: DC | PRN

## 2017-11-28 MED ORDER — SULFAMETHOXAZOLE-TRIMETHOPRIM 800-160 MG PO TABS: 1.0000 | ORAL_TABLET | Freq: Two times a day (BID) | ORAL | 0 refills | Status: AC

## 2017-11-28 MED ORDER — MUPIROCIN CALCIUM 2 % NA OINT: TOPICAL_OINTMENT | NASAL | 0 refills | Status: DC

## 2017-11-28 NOTE — Discharge Instructions (Addendum)
Apply warm wet soaks or compresses on/off to your groin.  Take the antibiotic as directed.  As discussed, return here in 2-3 days if the area is not improving.  Also, try using moleskin to the blisters on your feet.

## 2017-11-28 NOTE — ED Triage Notes (Addendum)
Patient c/o two blisters to right foot and 1 to left foot that appeared 4 days ago. Denies any known cause. Patient also c/o abscess to left groin that appeared 3 days ago that is progressively getting more painful. Denies any drainage. Patient is diabetic but states blood sugars have been "running normal." Blood sugar 258 in triage.
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
## 2017-12-13 DIAGNOSIS — L02611 Cutaneous abscess of right foot: Secondary | ICD-10-CM | POA: Diagnosis not present

## 2017-12-19 ENCOUNTER — Other Ambulatory Visit: Payer: Self-pay | Admitting: Family Medicine

## 2018-01-03 DIAGNOSIS — E1165 Type 2 diabetes mellitus with hyperglycemia: Secondary | ICD-10-CM | POA: Diagnosis not present

## 2018-01-03 DIAGNOSIS — E785 Hyperlipidemia, unspecified: Secondary | ICD-10-CM | POA: Diagnosis not present

## 2018-01-03 DIAGNOSIS — E559 Vitamin D deficiency, unspecified: Secondary | ICD-10-CM | POA: Diagnosis not present

## 2018-01-03 DIAGNOSIS — R350 Frequency of micturition: Secondary | ICD-10-CM | POA: Diagnosis not present

## 2018-07-06 ENCOUNTER — Encounter (HOSPITAL_COMMUNITY): Payer: Self-pay

## 2018-07-06 ENCOUNTER — Emergency Department (HOSPITAL_COMMUNITY)
Admission: EM | Admit: 2018-07-06 | Discharge: 2018-07-06 | Disposition: A | Discharge status: Home / Self Care | Payer: Medicaid Other | Attending: Emergency Medicine | Admitting: Emergency Medicine

## 2018-07-06 ENCOUNTER — Other Ambulatory Visit: Payer: Self-pay

## 2018-07-06 DIAGNOSIS — E119 Type 2 diabetes mellitus without complications: Secondary | ICD-10-CM | POA: Insufficient documentation

## 2018-07-06 DIAGNOSIS — F17210 Nicotine dependence, cigarettes, uncomplicated: Secondary | ICD-10-CM | POA: Insufficient documentation

## 2018-07-06 DIAGNOSIS — Z794 Long term (current) use of insulin: Secondary | ICD-10-CM | POA: Insufficient documentation

## 2018-07-06 DIAGNOSIS — Z79899 Other long term (current) drug therapy: Secondary | ICD-10-CM | POA: Insufficient documentation

## 2018-07-06 DIAGNOSIS — I10 Essential (primary) hypertension: Secondary | ICD-10-CM | POA: Insufficient documentation

## 2018-07-06 DIAGNOSIS — J111 Influenza due to unidentified influenza virus with other respiratory manifestations: Secondary | ICD-10-CM | POA: Insufficient documentation

## 2018-07-06 LAB — INFLUENZA PANEL BY PCR (TYPE A & B)
Influenza A By PCR: POSITIVE — AB
Influenza B By PCR: NEGATIVE

## 2018-07-06 MED ORDER — IBUPROFEN 800 MG PO TABS: 800.0000 mg | ORAL_TABLET | Freq: Three times a day (TID) | ORAL | 0 refills | Status: DC

## 2018-07-06 MED ORDER — IBUPROFEN 800 MG PO TABS
800.0000 mg | ORAL_TABLET | Freq: Once | ORAL | Status: AC
  Administered 2018-07-06: 800 mg via ORAL
  Filled 2018-07-06: qty 1

## 2018-07-06 MED ORDER — OSELTAMIVIR PHOSPHATE 75 MG PO CAPS: 75.0000 mg | ORAL_CAPSULE | Freq: Two times a day (BID) | ORAL | 0 refills | Status: AC

## 2018-07-06 NOTE — ED Triage Notes (Signed)
Pt presents to ED with complaints of body aches, sore throat, fever unsure how high, and cough which started this morning. Pt cough is non productive.

## 2018-07-06 NOTE — Discharge Instructions (Addendum)
Return if any problems.  Ibuprofen for fever and body aches.
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
## 2018-11-28 ENCOUNTER — Emergency Department (HOSPITAL_COMMUNITY): Payer: Self-pay

## 2018-11-28 ENCOUNTER — Encounter (HOSPITAL_COMMUNITY): Payer: Self-pay

## 2018-11-28 ENCOUNTER — Other Ambulatory Visit: Payer: Self-pay

## 2018-11-28 ENCOUNTER — Emergency Department (HOSPITAL_COMMUNITY)
Admission: EM | Admit: 2018-11-28 | Discharge: 2018-11-28 | Disposition: A | Discharge status: Home / Self Care | Payer: Self-pay | Attending: Emergency Medicine | Admitting: Emergency Medicine

## 2018-11-28 DIAGNOSIS — F1210 Cannabis abuse, uncomplicated: Secondary | ICD-10-CM | POA: Insufficient documentation

## 2018-11-28 DIAGNOSIS — Z79899 Other long term (current) drug therapy: Secondary | ICD-10-CM | POA: Insufficient documentation

## 2018-11-28 DIAGNOSIS — R0602 Shortness of breath: Secondary | ICD-10-CM | POA: Insufficient documentation

## 2018-11-28 DIAGNOSIS — E871 Hypo-osmolality and hyponatremia: Secondary | ICD-10-CM | POA: Insufficient documentation

## 2018-11-28 DIAGNOSIS — E119 Type 2 diabetes mellitus without complications: Secondary | ICD-10-CM | POA: Insufficient documentation

## 2018-11-28 DIAGNOSIS — R42 Dizziness and giddiness: Secondary | ICD-10-CM | POA: Insufficient documentation

## 2018-11-28 DIAGNOSIS — Z794 Long term (current) use of insulin: Secondary | ICD-10-CM | POA: Insufficient documentation

## 2018-11-28 DIAGNOSIS — I471 Supraventricular tachycardia: Secondary | ICD-10-CM | POA: Insufficient documentation

## 2018-11-28 DIAGNOSIS — F17210 Nicotine dependence, cigarettes, uncomplicated: Secondary | ICD-10-CM | POA: Insufficient documentation

## 2018-11-28 DIAGNOSIS — I10 Essential (primary) hypertension: Secondary | ICD-10-CM | POA: Insufficient documentation

## 2018-11-28 LAB — BASIC METABOLIC PANEL
Anion gap: 11 (ref 5–15)
BUN: 13 mg/dL (ref 6–20)
CO2: 22 mmol/L (ref 22–32)
Calcium: 9.3 mg/dL (ref 8.9–10.3)
Chloride: 96 mmol/L — ABNORMAL LOW (ref 98–111)
Creatinine, Ser: 0.99 mg/dL (ref 0.61–1.24)
GFR calc Af Amer: >60 mL/min (ref >60)
GFR calc non Af Amer: >60 mL/min (ref >60)
Glucose, Bld: 294 mg/dL — ABNORMAL HIGH (ref 70–99)
Potassium: 3.7 mmol/L (ref 3.5–5.1)
Sodium: 129 mmol/L — ABNORMAL LOW (ref 135–145)

## 2018-11-28 LAB — CBC
HCT: 49 % (ref 39.0–52.0)
Hemoglobin: 17 g/dL (ref 13.0–17.0)
MCH: 29.8 pg (ref 26.0–34.0)
MCHC: 34.7 g/dL (ref 30.0–36.0)
MCV: 85.8 fL (ref 80.0–100.0)
Platelets: 312 10*3/uL (ref 150–400)
RBC: 5.71 MIL/uL (ref 4.22–5.81)
RDW: 12 % (ref 11.5–15.5)
WBC: 8.4 10*3/uL (ref 4.0–10.5)
nRBC: 0 % (ref 0.0–0.2)

## 2018-11-28 LAB — MAGNESIUM: Magnesium: 2.2 mg/dL (ref 1.7–2.4)

## 2018-11-28 LAB — TSH: TSH: 1.412 u[IU]/mL (ref 0.350–4.500)

## 2018-11-28 MED ORDER — METOPROLOL TARTRATE 25 MG PO TABS: 50.0000 mg | ORAL_TABLET | Freq: Once | ORAL | 0 refills | Status: DC | PRN

## 2018-11-28 MED ORDER — SODIUM CHLORIDE 0.9 % IV BOLUS
1000.0000 mL | Freq: Once | INTRAVENOUS | Status: AC
  Administered 2018-11-28: 1000 mL via INTRAVENOUS

## 2018-11-28 NOTE — ED Triage Notes (Signed)
Pt went into work today and suddenly felt heart racing and dizzy which lasted approx 30 mins. EMS reports pt in SVT at 195 and they did cold packs and vagal maneuver and converted back to rate of 105-110
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
## 2019-08-21 ENCOUNTER — Emergency Department (HOSPITAL_COMMUNITY)
Admission: EM | Admit: 2019-08-21 | Discharge: 2019-08-21 | Disposition: A | Discharge status: Home / Self Care | Payer: Medicaid Other | Attending: Emergency Medicine | Admitting: Emergency Medicine

## 2019-08-21 ENCOUNTER — Encounter (HOSPITAL_COMMUNITY): Payer: Self-pay

## 2019-08-21 ENCOUNTER — Other Ambulatory Visit: Payer: Self-pay

## 2019-08-21 DIAGNOSIS — I10 Essential (primary) hypertension: Secondary | ICD-10-CM | POA: Insufficient documentation

## 2019-08-21 DIAGNOSIS — Z7984 Long term (current) use of oral hypoglycemic drugs: Secondary | ICD-10-CM | POA: Insufficient documentation

## 2019-08-21 DIAGNOSIS — E1165 Type 2 diabetes mellitus with hyperglycemia: Secondary | ICD-10-CM | POA: Insufficient documentation

## 2019-08-21 DIAGNOSIS — Z79899 Other long term (current) drug therapy: Secondary | ICD-10-CM | POA: Insufficient documentation

## 2019-08-21 DIAGNOSIS — R739 Hyperglycemia, unspecified: Secondary | ICD-10-CM

## 2019-08-21 DIAGNOSIS — F17210 Nicotine dependence, cigarettes, uncomplicated: Secondary | ICD-10-CM | POA: Insufficient documentation

## 2019-08-21 LAB — CBG MONITORING, ED: Glucose-Capillary: 191 mg/dL — ABNORMAL HIGH (ref 70–99)

## 2019-08-21 NOTE — ED Triage Notes (Signed)
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 Urgent care told him to come here. Pt takes metformin
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
## 2019-08-21 NOTE — Discharge Instructions (Addendum)
Your hemoglobin A1c level is pending.  Blood pressure and blood sugar both somewhat elevated.  Follow-up with your doctor for further management of both.

## 2019-08-22 LAB — HEMOGLOBIN A1C
Hgb A1c MFr Bld: 9.8 % — ABNORMAL HIGH (ref 4.8–5.6)
Mean Plasma Glucose: 235 mg/dL

## 2020-02-09 ENCOUNTER — Ambulatory Visit (INDEPENDENT_AMBULATORY_CARE_PROVIDER_SITE_OTHER): Payer: Medicaid Other | Admitting: Family Medicine

## 2020-02-09 ENCOUNTER — Encounter: Payer: Self-pay | Admitting: Family Medicine

## 2020-02-09 ENCOUNTER — Other Ambulatory Visit: Payer: Self-pay

## 2020-02-09 VITALS — BP 144/88 | HR 77 | Ht 71.0 in | Wt 245.0 lb

## 2020-02-09 DIAGNOSIS — Z794 Long term (current) use of insulin: Secondary | ICD-10-CM

## 2020-02-09 DIAGNOSIS — I10 Essential (primary) hypertension: Secondary | ICD-10-CM

## 2020-02-09 DIAGNOSIS — Z23 Encounter for immunization: Secondary | ICD-10-CM | POA: Diagnosis not present

## 2020-02-09 DIAGNOSIS — H538 Other visual disturbances: Secondary | ICD-10-CM

## 2020-02-09 DIAGNOSIS — Z720 Tobacco use: Secondary | ICD-10-CM

## 2020-02-09 DIAGNOSIS — H938X3 Other specified disorders of ear, bilateral: Secondary | ICD-10-CM | POA: Insufficient documentation

## 2020-02-09 DIAGNOSIS — E119 Type 2 diabetes mellitus without complications: Secondary | ICD-10-CM | POA: Diagnosis not present

## 2020-02-09 DIAGNOSIS — R002 Palpitations: Secondary | ICD-10-CM

## 2020-02-09 DIAGNOSIS — E785 Hyperlipidemia, unspecified: Secondary | ICD-10-CM

## 2020-02-09 DIAGNOSIS — E782 Mixed hyperlipidemia: Secondary | ICD-10-CM | POA: Insufficient documentation

## 2020-02-09 DIAGNOSIS — E669 Obesity, unspecified: Secondary | ICD-10-CM

## 2020-02-09 LAB — POCT GLYCOSYLATED HEMOGLOBIN (HGB A1C)
HbA1c POC (<> result, manual entry): 8.7 % (ref 4.0–5.6)
HbA1c, POC (controlled diabetic range): 8.7 % — AB (ref 0.0–7.0)
HbA1c, POC (prediabetic range): 8.7 % — AB (ref 5.7–6.4)
Hemoglobin A1C: 8.7 % — AB (ref 4.0–5.6)

## 2020-02-09 MED ORDER — PEN NEEDLES 32G X 6 MM MISC: 1.0000 | Freq: Every day | 3 refills | Status: DC

## 2020-02-09 MED ORDER — NICOTINE 21 MG/24HR TD PT24: 21.0000 mg | MEDICATED_PATCH | Freq: Every day | TRANSDERMAL | 1 refills | Status: DC

## 2020-02-09 MED ORDER — METFORMIN HCL 1000 MG PO TABS: 1000.0000 mg | ORAL_TABLET | Freq: Two times a day (BID) | ORAL | 1 refills | Status: DC

## 2020-02-09 MED ORDER — SIMVASTATIN 40 MG PO TABS: 40.0000 mg | ORAL_TABLET | Freq: Every evening | ORAL | 1 refills | Status: DC

## 2020-02-09 MED ORDER — LISINOPRIL-HYDROCHLOROTHIAZIDE 20-25 MG PO TABS: 1.0000 | ORAL_TABLET | Freq: Every day | ORAL | 1 refills | Status: DC

## 2020-02-09 MED ORDER — GLIPIZIDE 10 MG PO TABS: 10.0000 mg | ORAL_TABLET | Freq: Every day | ORAL | 2 refills | Status: DC

## 2020-02-09 NOTE — Patient Instructions (Addendum)
I appreciate the opportunity to provide you with care for your health and wellness. Today we discussed: established care   Follow up: 3 months for BP and DM check A1c in office   Labs- today at [place]  Referrals today: [facility] and [address] for Diabetic check up  Nice to meet you today!  Please review the medications I have sent in for you. I have changed one of the diabetic medications and you will only be on oral medications for now. In 3 months we will see how that is going and if needed we can always restart lantus. Diet control and exercise will help you avoid that.  Call if you have any questions.  Please continue to practice social distancing to keep you, your family, and our community safe.  If you must go out, please wear a mask and practice good handwashing.  It was a pleasure to see you and I look forward to continuing to work together on your health and well-being. Please do not hesitate to call the office if you need care or have questions about your care.  Have a wonderful day and week. With Gratitude, Cherly Beach, DNP, AGNP-BC

## 2020-02-09 NOTE — Assessment & Plan Note (Signed)
Raymond Malone is encouraged to check blood sugar daily as directed. Refilled Oral agents. Holding off on restarting Lantus A1c is 8.7% today in office Is on statin as well- refill provided today Educated on importance of maintain a well balanced diabetic friendly diet. He is reminded the importance of maintaining  good blood sugars,  taking medications as directed, daily foot care, annual eye exams. Additionally educated about keeping good control over blood pressure and cholesterol as well.

## 2020-02-09 NOTE — Assessment & Plan Note (Signed)
Raymond Malone is encouraged to maintain a well balanced diet that is low in salt. Controlled, continue current medication regimen. Refills provided    Additionally, he is also reminded that exercise is beneficial for heart health and control of  Blood pressure. 30-60 minutes daily is recommended-walking was suggested.

## 2020-02-09 NOTE — Progress Notes (Signed)
Subjective:  Patient ID: Nunzio Cobbs, male    DOB: Sep 25, 1975  Age: 44 y.o. MRN: [id number]  CC:  Chief Complaint  Patient presents with  . Establish Care      HPI  HPI  Mr. Schwager is a 44 year old male patient who presents today to establish care.  He reports that he has been 4 to 5 days almost a week out of his medications.  History is as stated below.  Most notably he has type 2 diabetes currently out of medication, hyperlipidemia, hypertension add a medication as well with these.  He also has a history of alcohol dependence which she reports that he is quit drinking.  Now he wants to quit smoking.  And would like help with that today.  He denies having any sleep issues.  He denies having any trouble chewing or swallowing.  Has not seen a dentist in some time.  Denies having any trouble making water or urine denies have any blood in urine or stool.  Denies having any memory changes.  Denies having any falls or injuries.  Denies having skin issues.  Reports some ear pressure and discomfort.  Says that he has had tubes put in his ears not long ago and he thinks that he is having the same sensation when the fluid buildup again.  Would like to see the ear doctor about this again.  Referral will be made today.  Does report some blurred vision has never seen the eye doctor for diabetic eye exam.  As far as he knows he does not have a family history of colon cancer.  He will be due for colonoscopy at age 50-50.  He is willing to get his flu vaccine today in office.  And have his A1c checked.  And to get some updated labs at [facility].  He reports that he smokes about a pack a day.  Has tried a medication before but it made him feel bad.  Is willing to try the patches.  He denies having any chest pain, current palpitations-reports that he feels like he sometimes has some to take his breath away or feels like a skipped beat.  Has had a rapid heart rate in the past.  Was taken to the emergency  room and was given a few doses of Lopressor but has not been on them in some time.  We will get an EKG in office today to rule out any rate changes.  He denies having any leg swelling, cough, shortness of breath, dizziness or headaches.  Currently he has supplies to check his diabetes and refills of his medications.  He reports that he is not been eating the best diet but in general he has been trying to make better choices all around he has stopped drinking soda.  regular basis.  Today patient denies signs and symptoms of COVID 19 infection including fever, chills, cough, shortness of breath, and headache. Past Medical, Surgical, Social History, Allergies, and Medications have been Reviewed.   Past Medical History:  Diagnosis Date  . Abscess, axilla 10/06/2016  . Alcohol dependence ([place]) 10/06/2016  . Diabetes mellitus without complication ([place])   . DKA, type 2 ([place]) 10/06/2016  . Dyshidrotic eczema 10/27/2016  . GERD (gastroesophageal reflux disease)   . Hyperlipidemia   . Hyperosmolar non-ketotic state in patient with type 2 diabetes mellitus ([place]) 10/06/2016  . Hyperosmolarity due to secondary diabetes mellitus ([place]) 10/06/2016  . Hypertension   . MRSA (methicillin resistant Staphylococcus  aureus)   . Skin abscess     Current Meds  Medication Sig  . lisinopril-hydrochlorothiazide (ZESTORETIC) 20-25 MG tablet Take 1 tablet by mouth daily.  . metFORMIN (GLUCOPHAGE) 1000 MG tablet Take 1 tablet (1,000 mg total) by mouth 2 (two) times daily with a meal.  . simvastatin (ZOCOR) 40 MG tablet Take 1 tablet (40 mg total) by mouth every evening.  . [DISCONTINUED] glimepiride (AMARYL) 4 MG tablet Take 1 tablet by mouth daily.  . [DISCONTINUED] ibuprofen (ADVIL,MOTRIN) 800 MG tablet Take 1 tablet (800 mg total) by mouth 3 (three) times daily.  . [DISCONTINUED] Insulin Pen Needle (PEN NEEDLES) 32G X 6 MM MISC Inject 1 each into the skin daily.  . [DISCONTINUED] Insulin Pen Needle (PEN NEEDLES) 32G X  6 MM MISC Inject 1 each into the skin daily.  . [DISCONTINUED] lisinopril-hydrochlorothiazide (PRINZIDE,ZESTORETIC) 20-25 MG tablet Take 1 tablet by mouth daily.  . [DISCONTINUED] metFORMIN (GLUCOPHAGE) 1000 MG tablet Take 1 tablet by mouth 2 (two) times a day.  . [DISCONTINUED] metoprolol tartrate (LOPRESSOR) 25 MG tablet Take 2 tablets (50 mg total) by mouth once as needed for up to 1 dose.  . [DISCONTINUED] simvastatin (ZOCOR) 40 MG tablet Take 40 mg by mouth every evening.    ROS:  Review of Systems  Constitutional: Negative.   HENT:       Ear pressure   Eyes: Negative.   Respiratory: Negative.   Cardiovascular: Positive for palpitations.  Gastrointestinal: Negative.   Genitourinary: Negative.   Musculoskeletal: Negative.   Skin: Negative.   Neurological: Negative.   Endo/Heme/Allergies: Negative.   Psychiatric/Behavioral: Negative.      Objective:   Today's Vitals: BP (!) 144/88 (BP Location: Left Arm, Patient Position: Sitting, Cuff Size: Large)   Pulse 77   Ht 5' 11"  (1.803 m)   Wt 245 lb (111.1 kg)   SpO2 96%   BMI 34.17 kg/m  Vitals with BMI 02/09/2020 08/21/2019 11/28/2018  Height 5' 11"  5' 11"  -  Weight 245 lbs 254 lbs -  BMI 46.56 81.27 -  Systolic 517 001 749  Diastolic 88 449 89  Pulse 77 86 -     Physical Exam Vitals and nursing note reviewed.  Constitutional:      Appearance: Normal appearance. He is well-developed and well-groomed. He is obese.  HENT:     Head: Normocephalic and atraumatic.     Right Ear: Ear canal and external ear normal. Tympanic membrane is injected and scarred.     Left Ear: Ear canal and external ear normal. Tympanic membrane is injected and scarred.     Nose: Nose normal.     Mouth/Throat:     Mouth: Mucous membranes are moist.     Pharynx: Oropharynx is clear.  Eyes:     General:        Right eye: No discharge.        Left eye: No discharge.     Conjunctiva/sclera: Conjunctivae normal.  Cardiovascular:     Rate and  Rhythm: Normal rate and regular rhythm.     Pulses: Normal pulses.     Heart sounds: Normal heart sounds.  Pulmonary:     Effort: Pulmonary effort is normal.     Breath sounds: Normal breath sounds.  Musculoskeletal:        General: Normal range of motion.     Cervical back: Normal range of motion and neck supple.  Skin:    General: Skin is warm.  Neurological:  General: No focal deficit present.     Mental Status: He is alert and oriented to person, place, and time.  Psychiatric:        Attention and Perception: Attention normal.        Mood and Affect: Mood normal.        Speech: Speech normal.        Behavior: Behavior normal. Behavior is cooperative.        Thought Content: Thought content normal.        Cognition and Memory: Cognition normal.        Judgment: Judgment normal.    EKG: Normal sinus rhythm, 72 bpm, normal ECG tracing.  Personally reviewed by me.   Assessment   1. Type 2 diabetes mellitus without complication, with long-term current use of insulin ([place])   2. Tobacco abuse   3. Hyperlipidemia, unspecified hyperlipidemia type   4. Essential hypertension   5. Pressure sensation in ear, bilateral   6. Blurred vision   7. Obesity (BMI 30.0-34.9)   8. Need for immunization against influenza     Tests ordered Orders Placed This Encounter  Procedures  . Flu Vaccine QUAD 36+ mos IM  . CBC  . Comprehensive metabolic panel  . Ambulatory referral to ENT  . Ambulatory referral to Ophthalmology  . POCT glycosylated hemoglobin (Hb A1C)     Plan: Please see assessment and plan per problem list above.   Meds ordered this encounter  Medications  . nicotine (NICODERM CQ - DOSED IN MG/24 HOURS) 21 mg/24hr patch    Sig: Place 1 patch (21 mg total) onto the skin daily.    Dispense:  28 patch    Refill:  1    Order Specific Question:   Supervising Provider    Answer:   SIMPSON, MARGARET E [[id number]]  . simvastatin (ZOCOR) 40 MG tablet    Sig: Take 1 tablet  (40 mg total) by mouth every evening.    Dispense:  90 tablet    Refill:  1    Order Specific Question:   Supervising Provider    Answer:   SIMPSON, MARGARET E [[id number]]  . metFORMIN (GLUCOPHAGE) 1000 MG tablet    Sig: Take 1 tablet (1,000 mg total) by mouth 2 (two) times daily with a meal.    Dispense:  180 tablet    Refill:  1    Order Specific Question:   Supervising Provider    Answer:   SIMPSON, MARGARET E [[id number]]  . lisinopril-hydrochlorothiazide (ZESTORETIC) 20-25 MG tablet    Sig: Take 1 tablet by mouth daily.    Dispense:  90 tablet    Refill:  1    Order Specific Question:   Supervising Provider    Answer:   SIMPSON, MARGARET E [[id number]]  . DISCONTD: Insulin Pen Needle (PEN NEEDLES) 32G X 6 MM MISC    Sig: Inject 1 each into the skin daily.    Dispense:  100 each    Refill:  3    Order Specific Question:   Supervising Provider    Answer:   SIMPSON, MARGARET E [[id number]]  . glipiZIDE (GLUCOTROL) 10 MG tablet    Sig: Take 1 tablet (10 mg total) by mouth daily before breakfast.    Dispense:  30 tablet    Refill:  2    Order Specific Question:   Supervising Provider    Answer:   Fayrene Helper [[id number]]    Patient to follow-up in 3 months  Perlie Mayo, NP

## 2020-02-09 NOTE — Assessment & Plan Note (Signed)
EKG promising in office today. NSR- 72 bpm.  Advised him to report back if these continue. I have ordered some labs. Will do referral to Cards in future if needed.  Patient acknowledged agreement and understanding of the plan.

## 2020-02-09 NOTE — Assessment & Plan Note (Signed)
Refills provided, encouraged a heart healthy diet

## 2020-02-09 NOTE — Assessment & Plan Note (Signed)
Referral to ENT- appears to have some scars from previous tubes, but injected TM. Patient acknowledged agreement and understanding of the plan.

## 2020-02-09 NOTE — Assessment & Plan Note (Signed)
Asked about quitting: confirms they are currently smokes cigarettes Advise to quit smoking: Educated about QUITTING to reduce the risk of cancer, cardio and cerebrovascular disease. Assess willingness: Unwilling to quit at this time, but is working on cutting back. Assist with counseling and pharmacotherapy: Counseled for 5 minutes and literature provided. Arrange for follow up: working quitting follow up in 3 months and continue to offer help.   Smoking 1 pk daily, starting patches today 21 mg for 1-2 months then wean to 14 mg then to 7 mg if able to tolerate and has success.

## 2020-02-09 NOTE — Assessment & Plan Note (Signed)
Reports he has never had a Eye exam. Needs for DM- referral made Patient acknowledged agreement and understanding of the plan.

## 2020-02-09 NOTE — Assessment & Plan Note (Signed)
Patient was educated on the recommendation for flu vaccine. After obtaining informed consent, the vaccine was administered no adverse effects noted at time of administration. Patient provided with education on arm soreness and use of tylenol or ibuprofen (if safe) for this. Encourage to use the arm vaccine was given in to help reduce the soreness. Patient educated on the signs of a reaction to the vaccine and advised to contact the office should these occur.

## 2020-02-09 NOTE — Assessment & Plan Note (Signed)
He is encouraged to start exercising 30-60 mins a daily and to watch his diet so he focuses on DM friend and heart healthy.  Wt Readings from Last 3 Encounters:  02/09/20 245 lb (111.1 kg)  08/21/19 254 lb (115.2 kg)  11/28/18 250 lb (113.4 kg)

## 2020-02-10 LAB — CBC
Hematocrit: 44.7 % (ref 37.5–51.0)
Hemoglobin: 15.1 g/dL (ref 13.0–17.7)
MCH: 29 pg (ref 26.6–33.0)
MCHC: 33.8 g/dL (ref 31.5–35.7)
MCV: 86 fL (ref 79–97)
Platelets: 318 10*3/uL (ref 150–450)
RBC: 5.21 x10E6/uL (ref 4.14–5.80)
RDW: 12.1 % (ref 11.6–15.4)
WBC: 7.1 10*3/uL (ref 3.4–10.8)

## 2020-02-10 LAB — COMPREHENSIVE METABOLIC PANEL
ALT: 30 IU/L (ref 0–44)
AST: 20 IU/L (ref 0–40)
Albumin/Globulin Ratio: 2 (ref 1.2–2.2)
Albumin: 4.3 g/dL (ref 4.0–5.0)
Alkaline Phosphatase: 95 IU/L (ref 44–121)
BUN/Creatinine Ratio: 15 (ref 9–20)
BUN: 14 mg/dL (ref 6–24)
Bilirubin Total: 0.5 mg/dL (ref 0.0–1.2)
CO2: 25 mmol/L (ref 20–29)
Calcium: 9.6 mg/dL (ref 8.7–10.2)
Chloride: 104 mmol/L (ref 96–106)
Creatinine, Ser: 0.94 mg/dL (ref 0.76–1.27)
GFR calc Af Amer: 114 mL/min/{1.73_m2} (ref >59)
GFR calc non Af Amer: 99 mL/min/{1.73_m2} (ref >59)
Globulin, Total: 2.2 g/dL (ref 1.5–4.5)
Glucose: 225 mg/dL — ABNORMAL HIGH (ref 65–99)
Potassium: 4.6 mmol/L (ref 3.5–5.2)
Sodium: 141 mmol/L (ref 134–144)
Total Protein: 6.5 g/dL (ref 6.0–8.5)

## 2020-02-13 ENCOUNTER — Telehealth: Payer: Self-pay

## 2020-02-13 NOTE — Telephone Encounter (Signed)
Pt is missing some medications, please call

## 2020-02-13 NOTE — Telephone Encounter (Signed)
Hey --Please send new referral to another ENT as this dr does not take insurance

## 2020-02-14 ENCOUNTER — Other Ambulatory Visit: Payer: Self-pay

## 2020-02-14 DIAGNOSIS — H938X3 Other specified disorders of ear, bilateral: Secondary | ICD-10-CM

## 2020-02-14 NOTE — Telephone Encounter (Signed)
Sent new referral to ent Dr.Teoh

## 2020-02-14 NOTE — Telephone Encounter (Signed)
Medications taken care of. Patient also stated ENT he was referred to didn't accept his insurance. New referral placed.

## 2020-03-14 DIAGNOSIS — H900 Conductive hearing loss, bilateral: Secondary | ICD-10-CM | POA: Diagnosis not present

## 2020-03-14 DIAGNOSIS — H6983 Other specified disorders of Eustachian tube, bilateral: Secondary | ICD-10-CM | POA: Diagnosis not present

## 2020-03-14 DIAGNOSIS — H6523 Chronic serous otitis media, bilateral: Secondary | ICD-10-CM | POA: Diagnosis not present

## 2020-04-22 DIAGNOSIS — J343 Hypertrophy of nasal turbinates: Secondary | ICD-10-CM | POA: Diagnosis not present

## 2020-04-22 DIAGNOSIS — H6983 Other specified disorders of Eustachian tube, bilateral: Secondary | ICD-10-CM | POA: Diagnosis not present

## 2020-04-22 DIAGNOSIS — J310 Chronic rhinitis: Secondary | ICD-10-CM | POA: Diagnosis not present

## 2020-04-22 DIAGNOSIS — H6523 Chronic serous otitis media, bilateral: Secondary | ICD-10-CM | POA: Diagnosis not present

## 2020-04-26 ENCOUNTER — Other Ambulatory Visit: Payer: Self-pay | Admitting: Otolaryngology

## 2020-05-07 DIAGNOSIS — H5213 Myopia, bilateral: Secondary | ICD-10-CM | POA: Diagnosis not present

## 2020-05-09 ENCOUNTER — Other Ambulatory Visit (HOSPITAL_COMMUNITY): Payer: Medicaid Other

## 2020-05-14 ENCOUNTER — Ambulatory Visit: Payer: Medicaid Other | Admitting: Family Medicine

## 2020-05-18 ENCOUNTER — Other Ambulatory Visit: Payer: Self-pay | Admitting: Family Medicine

## 2020-05-18 DIAGNOSIS — E119 Type 2 diabetes mellitus without complications: Secondary | ICD-10-CM

## 2020-05-21 ENCOUNTER — Other Ambulatory Visit (HOSPITAL_COMMUNITY): Payer: Medicaid Other

## 2020-05-23 DIAGNOSIS — Z20822 Contact with and (suspected) exposure to covid-19: Secondary | ICD-10-CM | POA: Diagnosis not present

## 2020-05-23 DIAGNOSIS — U071 COVID-19: Secondary | ICD-10-CM | POA: Diagnosis not present

## 2020-05-24 ENCOUNTER — Ambulatory Visit (HOSPITAL_BASED_OUTPATIENT_CLINIC_OR_DEPARTMENT_OTHER): Admission: RE | Admit: 2020-05-24 | Payer: Medicaid Other | Source: Home / Self Care | Admitting: Otolaryngology

## 2020-05-24 ENCOUNTER — Encounter (HOSPITAL_BASED_OUTPATIENT_CLINIC_OR_DEPARTMENT_OTHER): Admission: RE | Payer: Self-pay | Source: Home / Self Care

## 2020-05-24 SURGERY — MYRINGOTOMY WITH TUBE PLACEMENT
Anesthesia: General | Laterality: Bilateral

## 2020-06-19 ENCOUNTER — Other Ambulatory Visit: Payer: Self-pay

## 2020-06-19 ENCOUNTER — Ambulatory Visit (INDEPENDENT_AMBULATORY_CARE_PROVIDER_SITE_OTHER): Payer: Medicaid Other | Admitting: Nurse Practitioner

## 2020-06-19 ENCOUNTER — Encounter: Payer: Self-pay | Admitting: Nurse Practitioner

## 2020-06-19 DIAGNOSIS — I10 Essential (primary) hypertension: Secondary | ICD-10-CM

## 2020-06-19 DIAGNOSIS — E785 Hyperlipidemia, unspecified: Secondary | ICD-10-CM | POA: Diagnosis not present

## 2020-06-19 DIAGNOSIS — E119 Type 2 diabetes mellitus without complications: Secondary | ICD-10-CM | POA: Diagnosis not present

## 2020-06-19 DIAGNOSIS — N529 Male erectile dysfunction, unspecified: Secondary | ICD-10-CM | POA: Diagnosis not present

## 2020-06-19 DIAGNOSIS — R002 Palpitations: Secondary | ICD-10-CM | POA: Diagnosis not present

## 2020-06-19 DIAGNOSIS — Z794 Long term (current) use of insulin: Secondary | ICD-10-CM | POA: Diagnosis not present

## 2020-06-19 MED ORDER — BLOOD GLUCOSE METER KIT: PACK | 0 refills | Status: DC

## 2020-06-19 MED ORDER — LANTUS SOLOSTAR 100 UNIT/ML ~~LOC~~ SOPN: 16.0000 [IU] | PEN_INJECTOR | Freq: Every day | SUBCUTANEOUS | 99 refills | Status: DC

## 2020-06-19 MED ORDER — PEN NEEDLES 31G X 5 MM MISC: 1.0000 | Freq: Four times a day (QID) | 3 refills | Status: DC

## 2020-06-19 MED ORDER — NOVOLOG FLEXPEN 100 UNIT/ML ~~LOC~~ SOPN: 0.0000 [IU] | PEN_INJECTOR | Freq: Three times a day (TID) | SUBCUTANEOUS | 1 refills | Status: DC

## 2020-06-19 NOTE — Patient Instructions (Signed)
Please get fasting labs drawn prior to your next appointment.

## 2020-06-19 NOTE — Assessment & Plan Note (Addendum)
-  A1c in office is 12.8% -he states he has been using metformin and glipizide -Rx. novolog -Rx. lantus -on ACEi and statin

## 2020-06-19 NOTE — Assessment & Plan Note (Signed)
-  deferred until next visit -his blood sugar has been elevated -will delve into this issue in 2 weeks

## 2020-06-19 NOTE — Assessment & Plan Note (Signed)
-  he had previous EKG, but states he still has frequent feeling like his heart beat is irregular -regular rhythm on auscultation -refer to cardiology since this has been ongoing

## 2020-06-19 NOTE — Assessment & Plan Note (Signed)
-  BP well controlled today -no change to meds
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
## 2020-06-19 NOTE — Assessment & Plan Note (Signed)
-  no current labs -goal LDL < 70 -will check fasting labs with next draw

## 2020-06-26 ENCOUNTER — Other Ambulatory Visit: Payer: Self-pay

## 2020-06-26 ENCOUNTER — Telehealth: Payer: Self-pay

## 2020-06-26 DIAGNOSIS — E119 Type 2 diabetes mellitus without complications: Secondary | ICD-10-CM

## 2020-06-26 MED ORDER — BLOOD GLUCOSE METER KIT: PACK | 0 refills | Status: DC

## 2020-06-26 NOTE — Telephone Encounter (Signed)
Please send in RX for the monitor to read the sugar, he has everything else

## 2020-06-26 NOTE — Telephone Encounter (Signed)
Rx faxed to [facility]

## 2020-06-28 ENCOUNTER — Other Ambulatory Visit: Payer: Self-pay | Admitting: Otolaryngology

## 2020-07-03 ENCOUNTER — Other Ambulatory Visit: Payer: Self-pay

## 2020-07-03 ENCOUNTER — Ambulatory Visit: Payer: Medicaid Other | Admitting: Nurse Practitioner

## 2020-07-03 ENCOUNTER — Encounter: Payer: Self-pay | Admitting: Nurse Practitioner

## 2020-07-03 DIAGNOSIS — K219 Gastro-esophageal reflux disease without esophagitis: Secondary | ICD-10-CM

## 2020-07-03 DIAGNOSIS — Z794 Long term (current) use of insulin: Secondary | ICD-10-CM | POA: Diagnosis not present

## 2020-07-03 DIAGNOSIS — E119 Type 2 diabetes mellitus without complications: Secondary | ICD-10-CM | POA: Diagnosis not present

## 2020-07-03 MED ORDER — PANTOPRAZOLE SODIUM 20 MG PO TBEC: 20.0000 mg | DELAYED_RELEASE_TABLET | Freq: Every day | ORAL | 1 refills | Status: DC

## 2020-07-03 MED ORDER — BLOOD GLUCOSE METER KIT: PACK | 0 refills | Status: DC
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
## 2020-07-03 NOTE — Assessment & Plan Note (Signed)
-  Rx. pantoprazole

## 2020-07-03 NOTE — Patient Instructions (Signed)
Diabetes Mellitus and Nutrition, Adult When you have diabetes, or diabetes mellitus, it is very important to have healthy eating habits because your blood sugar (glucose) levels are greatly affected by what you eat and drink. Eating healthy foods in the right amounts, at about the same times every day, can help you:  Control your blood glucose.  Lower your risk of heart disease.  Improve your blood pressure.  Reach or maintain a healthy weight. What can affect my meal plan? Every person with diabetes is different, and each person has different needs for a meal plan. Your health care provider may recommend that you work with a dietitian to make a meal plan that is best for you. Your meal plan may vary depending on factors such as:  The calories you need.  The medicines you take.  Your weight.  Your blood glucose, blood pressure, and cholesterol levels.  Your activity level.  Other health conditions you have, such as heart or kidney disease. How do carbohydrates affect me? Carbohydrates, also called carbs, affect your blood glucose level more than any other type of food. Eating carbs naturally raises the amount of glucose in your blood. Carb counting is a method for keeping track of how many carbs you eat. Counting carbs is important to keep your blood glucose at a healthy level, especially if you use insulin or take certain oral diabetes medicines. It is important to know how many carbs you can safely have in each meal. This is different for every person. Your dietitian can help you calculate how many carbs you should have at each meal and for each snack. How does alcohol affect me? Alcohol can cause a sudden decrease in blood glucose (hypoglycemia), especially if you use insulin or take certain oral diabetes medicines. Hypoglycemia can be a life-threatening condition. Symptoms of hypoglycemia, such as sleepiness, dizziness, and confusion, are similar to symptoms of having too much  alcohol.  Do not drink alcohol if: ? Your health care provider tells you not to drink. ? You are pregnant, may be pregnant, or are planning to become pregnant.  If you drink alcohol: ? Do not drink on an empty stomach. ? Limit how much you use to:  0-1 drink a day for women.  0-2 drinks a day for men. ? Be aware of how much alcohol is in your drink. In the [place] one drink equals one 12 oz bottle of beer (355 mL), one 5 oz glass of wine (148 mL), or one 1 oz glass of hard liquor (44 mL). ? Keep yourself hydrated with water, diet soda, or unsweetened iced tea.  Keep in mind that regular soda, juice, and other mixers may contain a lot of sugar and must be counted as carbs. What are tips for following this plan? Reading food labels  Start by checking the serving size on the "Nutrition Facts" label of packaged foods and drinks. The amount of calories, carbs, fats, and other nutrients listed on the label is based on one serving of the item. Many items contain more than one serving per package.  Check the total grams (g) of carbs in one serving. You can calculate the number of servings of carbs in one serving by dividing the total carbs by 15. For example, if a food has 30 g of total carbs per serving, it would be equal to 2 servings of carbs.  Check the number of grams (g) of saturated fats and trans fats in one serving. Choose foods that have   a low amount or none of these fats.  Check the number of milligrams (mg) of salt (sodium) in one serving. Most people should limit total sodium intake to less than 2,300 mg per day.  Always check the nutrition information of foods labeled as "low-fat" or "nonfat." These foods may be higher in added sugar or refined carbs and should be avoided.  Talk to your dietitian to identify your daily goals for nutrients listed on the label. Shopping  Avoid buying canned, pre-made, or processed foods. These foods tend to be high in fat, sodium, and added  sugar.  Shop around the outside edge of the grocery store. This is where you will most often find fresh fruits and vegetables, bulk grains, fresh meats, and fresh dairy. Cooking  Use low-heat cooking methods, such as baking, instead of high-heat cooking methods like deep frying.  Cook using healthy oils, such as olive, canola, or sunflower oil.  Avoid cooking with butter, cream, or high-fat meats. Meal planning  Eat meals and snacks regularly, preferably at the same times every day. Avoid going long periods of time without eating.  Eat foods that are high in fiber, such as fresh fruits, vegetables, beans, and whole grains. Talk with your dietitian about how many servings of carbs you can eat at each meal.  Eat 4-6 oz (112-168 g) of lean protein each day, such as lean meat, chicken, fish, eggs, or tofu. One ounce (oz) of lean protein is equal to: ? 1 oz (28 g) of meat, chicken, or fish. ? 1 egg. ?  cup (62 g) of tofu.  Eat some foods each day that contain healthy fats, such as avocado, nuts, seeds, and fish.   What foods should I eat? Fruits Berries. Apples. Oranges. Peaches. Apricots. Plums. Grapes. Mango. Papaya. Pomegranate. Kiwi. Cherries. Vegetables Lettuce. Spinach. Leafy greens, including kale, chard, collard greens, and mustard greens. Beets. Cauliflower. Cabbage. Broccoli. Carrots. Green beans. Tomatoes. Peppers. Onions. Cucumbers. Brussels sprouts. Grains Whole grains, such as whole-wheat or whole-grain bread, crackers, tortillas, cereal, and pasta. Unsweetened oatmeal. Quinoa. Brown or wild rice. Meats and other proteins Seafood. Poultry without skin. Lean cuts of poultry and beef. Tofu. Nuts. Seeds. Dairy Low-fat or fat-free dairy products such as milk, yogurt, and cheese. The items listed above may not be a complete list of foods and beverages you can eat. Contact a dietitian for more information. What foods should I avoid? Fruits Fruits canned with  syrup. Vegetables Canned vegetables. Frozen vegetables with butter or cream sauce. Grains Refined white flour and flour products such as bread, pasta, snack foods, and cereals. Avoid all processed foods. Meats and other proteins Fatty cuts of meat. Poultry with skin. Breaded or fried meats. Processed meat. Avoid saturated fats. Dairy Full-fat yogurt, cheese, or milk. Beverages Sweetened drinks, such as soda or iced tea. The items listed above may not be a complete list of foods and beverages you should avoid. Contact a dietitian for more information. Questions to ask a health care provider  Do I need to meet with a diabetes educator?  Do I need to meet with a dietitian?  What number can I call if I have questions?  When are the best times to check my blood glucose? Where to find more information:  American Diabetes Association: diabetes.org  Academy of Nutrition and Dietetics: www.eatright.org  National Institute of Diabetes and Digestive and Kidney Diseases: www.niddk.nih.gov  Association of Diabetes Care and Education Specialists: www.diabeteseducator.org Summary  It is important to have healthy eating   habits because your blood sugar (glucose) levels are greatly affected by what you eat and drink.  A healthy meal plan will help you control your blood glucose and maintain a healthy lifestyle.  Your health care provider may recommend that you work with a dietitian to make a meal plan that is best for you.  Keep in mind that carbohydrates (carbs) and alcohol have immediate effects on your blood glucose levels. It is important to count carbs and to use alcohol carefully. This information is not intended to replace advice given to you by your health care provider. Make sure you discuss any questions you have with your health care provider. Document Revised: 04/04/2019 Document Reviewed: 04/04/2019 Elsevier Patient Education  2021 Elsevier Inc.  

## 2020-07-03 NOTE — Assessment & Plan Note (Signed)
-  A1c in office was 12.8% at last OV -he states he has been using metformin and glipizide -was prescribed novolog per high dose sliding scale -Taking lantus 16 units daily (qPM) -on ACEi and statin -he [facility] to [facility], [facility]

## 2020-07-15 ENCOUNTER — Encounter (HOSPITAL_BASED_OUTPATIENT_CLINIC_OR_DEPARTMENT_OTHER): Payer: Self-pay | Admitting: Otolaryngology

## 2020-07-15 ENCOUNTER — Other Ambulatory Visit: Payer: Self-pay

## 2020-07-18 ENCOUNTER — Ambulatory Visit: Payer: Medicaid Other | Admitting: Nurse Practitioner

## 2020-07-19 ENCOUNTER — Encounter (HOSPITAL_BASED_OUTPATIENT_CLINIC_OR_DEPARTMENT_OTHER)
Admission: RE | Admit: 2020-07-19 | Discharge: 2020-07-19 | Disposition: A | Discharge status: Home / Self Care | Payer: Medicaid Other | Source: Ambulatory Visit | Attending: Otolaryngology | Admitting: Otolaryngology

## 2020-07-19 ENCOUNTER — Other Ambulatory Visit (HOSPITAL_COMMUNITY)
Admission: RE | Admit: 2020-07-19 | Discharge: 2020-07-19 | Disposition: A | Discharge status: Home / Self Care | Payer: Medicaid Other | Source: Ambulatory Visit | Attending: Otolaryngology | Admitting: Otolaryngology

## 2020-07-19 DIAGNOSIS — H902 Conductive hearing loss, unspecified: Secondary | ICD-10-CM | POA: Diagnosis not present

## 2020-07-19 DIAGNOSIS — Z20822 Contact with and (suspected) exposure to covid-19: Secondary | ICD-10-CM | POA: Insufficient documentation

## 2020-07-19 DIAGNOSIS — Z79899 Other long term (current) drug therapy: Secondary | ICD-10-CM | POA: Diagnosis not present

## 2020-07-19 DIAGNOSIS — R0981 Nasal congestion: Secondary | ICD-10-CM | POA: Diagnosis not present

## 2020-07-19 DIAGNOSIS — Z01812 Encounter for preprocedural laboratory examination: Secondary | ICD-10-CM | POA: Insufficient documentation

## 2020-07-19 DIAGNOSIS — H65493 Other chronic nonsuppurative otitis media, bilateral: Secondary | ICD-10-CM | POA: Diagnosis not present

## 2020-07-19 DIAGNOSIS — H6993 Unspecified Eustachian tube disorder, bilateral: Secondary | ICD-10-CM | POA: Diagnosis present

## 2020-07-19 DIAGNOSIS — J343 Hypertrophy of nasal turbinates: Secondary | ICD-10-CM | POA: Diagnosis not present

## 2020-07-19 LAB — BASIC METABOLIC PANEL
Anion gap: 10 (ref 5–15)
BUN: 10 mg/dL (ref 6–20)
CO2: 24 mmol/L (ref 22–32)
Calcium: 9.1 mg/dL (ref 8.9–10.3)
Chloride: 97 mmol/L — ABNORMAL LOW (ref 98–111)
Creatinine, Ser: 0.9 mg/dL (ref 0.61–1.24)
GFR, Estimated: >60 mL/min (ref >60)
Glucose, Bld: 329 mg/dL — ABNORMAL HIGH (ref 70–99)
Potassium: 3.9 mmol/L (ref 3.5–5.1)
Sodium: 131 mmol/L — ABNORMAL LOW (ref 135–145)

## 2020-07-19 LAB — SARS CORONAVIRUS 2 (TAT 6-24 HRS): SARS Coronavirus 2: NEGATIVE

## 2020-07-19 NOTE — Progress Notes (Signed)
Raymond Malone presented to the drive-thru COVID testing site today for pre-procedural testing. Patient stated he tested positive for COVID-19 at an urgent care in [place] in January. There is no result on record and he was unable to provide documentation of the positive result. Patient tested per standing orders.

## 2020-07-20 NOTE — Anesthesia Preprocedure Evaluation (Signed)
Anesthesia Evaluation  Patient identified by MRN, date of birth, ID band Patient awake    Reviewed: Allergy & Precautions, H&P , NPO status , Patient's Chart, lab work & pertinent test results  Airway Mallampati: II  TM Distance: >3 FB Neck ROM: Full    Dental no notable dental hx. (+) Teeth Intact, Dental Advisory Given   Pulmonary neg pulmonary ROS, Current Smoker,    Pulmonary exam normal breath sounds clear to auscultation       Cardiovascular Exercise Tolerance: Good hypertension, Pt. on medications Normal cardiovascular exam Rhythm:Regular Rate:Normal     Neuro/Psych PSYCHIATRIC DISORDERS negative neurological ROS     GI/Hepatic GERD  Medicated,(+)     substance abuse  alcohol use,   Endo/Other  diabetes, Type 2Morbid obesity  Renal/GU negative Renal ROS  negative genitourinary   Musculoskeletal negative musculoskeletal ROS (+)   Abdominal   Peds negative pediatric ROS (+)  Hematology negative hematology ROS (+)   Anesthesia Other Findings   Reproductive/Obstetrics negative OB ROS                            Anesthesia Physical Anesthesia Plan  ASA: III  Anesthesia Plan: General   Post-op Pain Management:    Induction: Intravenous  PONV Risk Score and Plan: 1 and Ondansetron  Airway Management Planned: LMA, Oral ETT and Mask  Additional Equipment:   Intra-op Plan:   Post-operative Plan:   Informed Consent: I have reviewed the patients History and Physical, chart, labs and discussed the procedure including the risks, benefits and alternatives for the proposed anesthesia with the patient or authorized representative who has indicated his/her understanding and acceptance.       Plan Discussed with: Anesthesiologist  Anesthesia Plan Comments:        Anesthesia Quick Evaluation

## 2020-07-22 ENCOUNTER — Other Ambulatory Visit: Payer: Self-pay

## 2020-07-22 ENCOUNTER — Ambulatory Visit (HOSPITAL_BASED_OUTPATIENT_CLINIC_OR_DEPARTMENT_OTHER): Payer: Medicaid Other | Admitting: Anesthesiology

## 2020-07-22 ENCOUNTER — Encounter (HOSPITAL_BASED_OUTPATIENT_CLINIC_OR_DEPARTMENT_OTHER): Payer: Self-pay | Admitting: Otolaryngology

## 2020-07-22 ENCOUNTER — Ambulatory Visit (HOSPITAL_BASED_OUTPATIENT_CLINIC_OR_DEPARTMENT_OTHER)
Admission: RE | Admit: 2020-07-22 | Discharge: 2020-07-22 | Disposition: A | Discharge status: Home / Self Care | Payer: Medicaid Other | Attending: Otolaryngology | Admitting: Otolaryngology

## 2020-07-22 ENCOUNTER — Encounter (HOSPITAL_BASED_OUTPATIENT_CLINIC_OR_DEPARTMENT_OTHER)
Admission: RE | Disposition: A | Discharge status: Home / Self Care | Payer: Self-pay | Source: Home / Self Care | Attending: Otolaryngology

## 2020-07-22 DIAGNOSIS — J343 Hypertrophy of nasal turbinates: Secondary | ICD-10-CM | POA: Diagnosis not present

## 2020-07-22 DIAGNOSIS — Z79899 Other long term (current) drug therapy: Secondary | ICD-10-CM | POA: Diagnosis not present

## 2020-07-22 DIAGNOSIS — H6993 Unspecified Eustachian tube disorder, bilateral: Secondary | ICD-10-CM | POA: Insufficient documentation

## 2020-07-22 DIAGNOSIS — R0981 Nasal congestion: Secondary | ICD-10-CM | POA: Insufficient documentation

## 2020-07-22 DIAGNOSIS — I10 Essential (primary) hypertension: Secondary | ICD-10-CM | POA: Diagnosis not present

## 2020-07-22 DIAGNOSIS — E785 Hyperlipidemia, unspecified: Secondary | ICD-10-CM | POA: Diagnosis not present

## 2020-07-22 DIAGNOSIS — H6983 Other specified disorders of Eustachian tube, bilateral: Secondary | ICD-10-CM | POA: Diagnosis not present

## 2020-07-22 DIAGNOSIS — H900 Conductive hearing loss, bilateral: Secondary | ICD-10-CM | POA: Diagnosis not present

## 2020-07-22 DIAGNOSIS — H902 Conductive hearing loss, unspecified: Secondary | ICD-10-CM | POA: Insufficient documentation

## 2020-07-22 DIAGNOSIS — H65493 Other chronic nonsuppurative otitis media, bilateral: Secondary | ICD-10-CM | POA: Insufficient documentation

## 2020-07-22 DIAGNOSIS — H6523 Chronic serous otitis media, bilateral: Secondary | ICD-10-CM | POA: Diagnosis not present

## 2020-07-22 HISTORY — PX: MYRINGOTOMY WITH TUBE PLACEMENT: SHX5663

## 2020-07-22 LAB — GLUCOSE, CAPILLARY
Glucose-Capillary: 263 mg/dL — ABNORMAL HIGH (ref 70–99)
Glucose-Capillary: 235 mg/dL — ABNORMAL HIGH (ref 70–99)

## 2020-07-22 SURGERY — MYRINGOTOMY WITH TUBE PLACEMENT
Anesthesia: General | Laterality: Bilateral

## 2020-07-22 MED ORDER — DEXAMETHASONE SODIUM PHOSPHATE 10 MG/ML IJ SOLN
INTRAMUSCULAR | Status: AC
  Filled 2020-07-22: qty 1

## 2020-07-22 MED ORDER — OXYCODONE HCL 5 MG/5ML PO SOLN: 5.0000 mg | Freq: Once | ORAL | Status: DC | PRN

## 2020-07-22 MED ORDER — FENTANYL CITRATE (PF) 100 MCG/2ML IJ SOLN
INTRAMUSCULAR | Status: DC | PRN
  Administered 2020-07-22: 50 ug via INTRAVENOUS

## 2020-07-22 MED ORDER — MEPERIDINE HCL 25 MG/ML IJ SOLN: 6.2500 mg | INTRAMUSCULAR | Status: DC | PRN

## 2020-07-22 MED ORDER — MIDAZOLAM HCL 2 MG/2ML IJ SOLN
INTRAMUSCULAR | Status: AC
  Filled 2020-07-22: qty 2

## 2020-07-22 MED ORDER — ONDANSETRON HCL 4 MG/2ML IJ SOLN
INTRAMUSCULAR | Status: AC
  Filled 2020-07-22: qty 2

## 2020-07-22 MED ORDER — CIPROFLOXACIN-FLUOCINOLONE PF 0.3-0.025 % OT SOLN
OTIC | Status: AC
  Filled 2020-07-22: qty 1

## 2020-07-22 MED ORDER — LACTATED RINGERS IV SOLN: INTRAVENOUS | Status: DC

## 2020-07-22 MED ORDER — DEXAMETHASONE SODIUM PHOSPHATE 4 MG/ML IJ SOLN
INTRAMUSCULAR | Status: DC | PRN
  Administered 2020-07-22: 10 mg via INTRAVENOUS

## 2020-07-22 MED ORDER — FENTANYL CITRATE (PF) 100 MCG/2ML IJ SOLN
INTRAMUSCULAR | Status: AC
  Filled 2020-07-22: qty 2

## 2020-07-22 MED ORDER — CIPROFLOXACIN-DEXAMETHASONE 0.3-0.1 % OT SUSP
OTIC | Status: DC | PRN
  Administered 2020-07-22: 4 [drp] via OTIC

## 2020-07-22 MED ORDER — OXYCODONE HCL 5 MG PO TABS
5.0000 mg | ORAL_TABLET | Freq: Once | ORAL | Status: DC | PRN
Start: 2020-07-22 — End: 2020-07-22

## 2020-07-22 MED ORDER — PROPOFOL 500 MG/50ML IV EMUL
INTRAVENOUS | Status: AC
  Filled 2020-07-22: qty 50

## 2020-07-22 MED ORDER — ONDANSETRON HCL 4 MG/2ML IJ SOLN
INTRAMUSCULAR | Status: DC | PRN
  Administered 2020-07-22: 4 mg via INTRAVENOUS

## 2020-07-22 MED ORDER — LIDOCAINE 2% (20 MG/ML) 5 ML SYRINGE
INTRAMUSCULAR | Status: DC | PRN
  Administered 2020-07-22: 100 mg via INTRAVENOUS

## 2020-07-22 MED ORDER — BACITRACIN ZINC 500 UNIT/GM EX OINT
TOPICAL_OINTMENT | CUTANEOUS | Status: AC
  Filled 2020-07-22: qty 28.35

## 2020-07-22 MED ORDER — ACETAMINOPHEN 160 MG/5ML PO SOLN
325.0000 mg | ORAL | Status: DC | PRN
Start: 2020-07-22 — End: 2020-07-22

## 2020-07-22 MED ORDER — LIDOCAINE 2% (20 MG/ML) 5 ML SYRINGE
INTRAMUSCULAR | Status: AC
  Filled 2020-07-22: qty 5

## 2020-07-22 MED ORDER — OXYMETAZOLINE HCL 0.05 % NA SOLN
NASAL | Status: AC
  Filled 2020-07-22: qty 60

## 2020-07-22 MED ORDER — CIPROFLOXACIN-DEXAMETHASONE 0.3-0.1 % OT SUSP
OTIC | Status: AC
  Filled 2020-07-22: qty 15

## 2020-07-22 MED ORDER — ACETAMINOPHEN 325 MG PO TABS: 325.0000 mg | ORAL_TABLET | ORAL | Status: DC | PRN

## 2020-07-22 MED ORDER — PROPOFOL 10 MG/ML IV BOLUS
INTRAVENOUS | Status: DC | PRN
  Administered 2020-07-22: 200 mg via INTRAVENOUS

## 2020-07-22 MED ORDER — FENTANYL CITRATE (PF) 100 MCG/2ML IJ SOLN: 25.0000 ug | INTRAMUSCULAR | Status: DC | PRN

## 2020-07-22 MED ORDER — ONDANSETRON HCL 4 MG/2ML IJ SOLN: 4.0000 mg | Freq: Once | INTRAMUSCULAR | Status: DC | PRN

## 2020-07-22 MED ORDER — MIDAZOLAM HCL 5 MG/5ML IJ SOLN
INTRAMUSCULAR | Status: DC | PRN
  Administered 2020-07-22: 2 mg via INTRAVENOUS

## 2020-07-22 SURGICAL SUPPLY — 11 items
BALL CTTN LRG ABS STRL LF (GAUZE/BANDAGES/DRESSINGS) ×1
BLADE MYRINGOTOMY 45DEG STRL (BLADE) ×2 IMPLANT
CANISTER SUCT 1200ML W/VALVE (MISCELLANEOUS) ×2 IMPLANT
COTTONBALL LRG STERILE PKG (GAUZE/BANDAGES/DRESSINGS) ×2 IMPLANT
GAUZE SPONGE 4X4 12PLY STRL LF (GAUZE/BANDAGES/DRESSINGS) IMPLANT
IV SET EXT 30 76VOL 4 MALE LL (IV SETS) ×2 IMPLANT
NS IRRIG 1000ML POUR BTL (IV SOLUTION) IMPLANT
TOWEL GREEN STERILE FF (TOWEL DISPOSABLE) ×2 IMPLANT
TUBE CONNECTING 20X1/4 (TUBING) ×2 IMPLANT
TUBE EAR SHEEHY BUTTON 1.27 (OTOLOGIC RELATED) ×4 IMPLANT
TUBE EAR T MOD 1.32X4.8 BL (OTOLOGIC RELATED) IMPLANT

## 2020-07-22 NOTE — Anesthesia Procedure Notes (Signed)
Procedure Name: LMA Insertion Date/Time: 07/22/2020 8:14 AM Performed by: Lieutenant Diego, CRNA Pre-anesthesia Checklist: Patient identified, Emergency Drugs available, Suction available and Patient being monitored Patient Re-evaluated:Patient Re-evaluated prior to induction Oxygen Delivery Method: Circle system utilized Preoxygenation: Pre-oxygenation with 100% oxygen Induction Type: IV induction Ventilation: Mask ventilation without difficulty LMA: LMA inserted LMA Size: 5.0 Number of attempts: 1 Placement Confirmation: positive ETCO2 and breath sounds checked- equal and bilateral Tube secured with: Tape Dental Injury: Teeth and Oropharynx as per pre-operative assessment

## 2020-07-22 NOTE — H&P (Signed)
Cc: Middle ear effusions, conductive hearing loss  HPI: The patient is a 45 y/o male who returns today for follow up evaluation of hearing loss. The patient has a history of eustachian tube dysfunction. He underwent bilateral myringotomy and tube placement in 2019 by [facility] for bilateral middle ear effusion. The patient was last seen 4 weeks ago. At that time, he was noted to have bilateral middle ear effusions with associated hearing loss. The patient was placed on daily Flonase and encouraged to perform the valsalva exercise. The patient returns today noting no improvement in his hearing. No otalgia or otorrhea is noted. No other ENT, GI, or respiratory issue noted since the last visit.   Exam: General: Communicates without difficulty, well nourished, no acute distress. Head: Normocephalic, no evidence injury, no tenderness, facial buttresses intact without stepoff. Eyes: PERRL, EOMI. No scleral icterus, conjunctivae clear. Neuro: CN II exam reveals vision grossly intact. No nystagmus at any point of gaze. Ears: Auricles well formed without lesions. Ear canals are intact without mass or lesion. No erythema or edema is appreciated. The TMs are intact with bilateral middle ear effusions. Nose: External evaluation reveals normal support and skin without lesions. Dorsum is intact. Anterior rhinoscopy reveals congested mucosa over anterior aspect of inferior turbinates and intact septum. No purulence noted. Oral:  Oral cavity and oropharynx are intact, symmetric, without erythema or edema. Mucosa is moist without lesions. Neck: Full range of motion without pain. There is no significant lymphadenopathy. No masses palpable. Thyroid bed within normal limits to palpation. Parotid glands and submandibular glands equal bilaterally without mass. Trachea is midline. Neuro:  CN 2-12 grossly intact. Gait normal. Vestibular: No nystagmus at any point of gaze. The cerebellar examination is unremarkable.    Procedure: Flexible Nasal Endoscopy Description: Risks, benefits, and alternatives of flexible endoscopy were explained to the patient. Specific mention was made of the risk of throat numbness with difficulty swallowing, possible bleeding from the nose and mouth, and pain from the procedure. The patient gave oral consent to proceed.  The flexible scope was inserted into the right nasal cavity. Endoscopy of the interior nasal cavity, superior, inferior, and middle meatus was performed. The sphenoid-ethmoid recess was examined. Edematous mucosa was noted. No polyp, mass, or lesion was appreciated. Olfactory cleft was clear. Nasopharynx was clear. Turbinates were hypertrophied but without mass. Incomplete response to decongestion. The procedure was repeated on the contralateral side with similar findings. The patient tolerated the procedure well.   Assessment  1. Bilateral middle ear effusions are noted with associated conductive hearing loss.  2. Bilateral eustachian tube dysfunction.  3. Severe nasal mucosal congestion is noted with bilateral inferior turbinate hypertrophy. No purulent drainage, polyps, or other suspicious mass or lesion is noted on today's nasal endoscopy.   Plan  1. The physical exam and nasal endoscopy findings are reviewed with the patient.  2. The treatment options for the middle ear effusion include continuing conservative observation with nasal steroid spray versus myringotomy and tube placement.  The risks, benefits, and details of the treatment modalities are discussed.  Questions are invited and answered.   3. The patient is interested in proceeding with the procedure.  We will schedule the procedure in accordance with the family schedule.

## 2020-07-22 NOTE — Discharge Instructions (Signed)
POSTOPERATIVE INSTRUCTIONS FOR PATIENTS HAVING MYRINGOTOMY AND TUBES  1. Nausea and vomiting may be expected the first 6 hours after surgery. Offer liquids initially. If there is no nausea, small light meals are usually best tolerated the day of surgery. A normal diet may be resumed once nausea has passed. 2. The patient may experience mild ear discomfort the day of surgery, which is usually relieved by Tylenol. 3. A small amount of clear or blood-tinged drainage from the ears may occur a few days after surgery. If this should persists or become thick, green, yellow, or foul smelling, please contact our office at [phone]. 4. If you see clear, green, or yellow drainage from your child's ear during colds, clean the outer ear gently with a soft, damp washcloth. Begin the prescribed ear drops (4 drops, twice a day) for one week, as previously instructed.  The drainage should stop within 48 hours after starting the ear drops. If the drainage continues or becomes yellow or green, please call our office. If your child develops a fever greater than 102 F, or has and persistent bleeding from the ear(s), please call us. 5. Try to avoid getting water in the ears. Swimming is permitted as long as there is no deep diving or swimming under water deeper than 3 feet. If you think water has gotten into the ear(s), either bathing or swimming, place 4 drops of the prescribed ear drops into the ear in question. We do recommend drops after swimming in the ocean, rivers, or lakes. 6. It is important for you to return for your scheduled appointment so that the status of the tubes can be determined.   ----------------------------------------------------  Post Anesthesia Home Care Instructions  Activity: Get plenty of rest for the remainder of the day. A responsible individual must stay with you for 24 hours following the procedure.  For the next 24 hours, DO NOT: -Drive a car -Paediatric nurse -Drink alcoholic  beverages -Take any medication unless instructed by your physician -Make any legal decisions or sign important papers.  Meals: Start with liquid foods such as gelatin or soup. Progress to regular foods as tolerated. Avoid greasy, spicy, heavy foods. If nausea and/or vomiting occur, drink only clear liquids until the nausea and/or vomiting subsides. Call your physician if vomiting continues.  Special Instructions/Symptoms: Your throat may feel dry or sore from the anesthesia or the breathing tube placed in your throat during surgery. If this causes discomfort, gargle with warm salt water. The discomfort should disappear within 24 hours.  If you had a scopolamine patch placed behind your ear for the management of post- operative nausea and/or vomiting:  1. The medication in the patch is effective for 72 hours, after which it should be removed.  Wrap patch in a tissue and discard in the trash. Wash hands thoroughly with soap and water. 2. You may remove the patch earlier than 72 hours if you experience unpleasant side effects which may include dry mouth, dizziness or visual disturbances. 3. Avoid touching the patch. Wash your hands with soap and water after contact with the patch.

## 2020-07-22 NOTE — Transfer of Care (Signed)
Immediate Anesthesia Transfer of Care Note  Patient: Raymond Malone  Procedure(s) Performed: BILATERAL MYRINGOTOMY WITH TUBE PLACEMENT (Bilateral )  Patient Location: PACU  Anesthesia Type:General  Level of Consciousness: awake and alert   Airway & Oxygen Therapy: Patient Spontanous Breathing and Patient connected to face mask oxygen  Post-op Assessment: Report given to RN and Post -op Vital signs reviewed and stable  Post vital signs: Reviewed and stable  Last Vitals:  Vitals Value Taken Time  BP 129/76 07/22/20 0831  Temp    Pulse 100 07/22/20 0832  Resp 22 07/22/20 0832  SpO2 98 % 07/22/20 0832  Vitals shown include unvalidated device data.  Last Pain:  Vitals:   07/22/20 0724  TempSrc: Oral  PainSc: 0-No pain      Patients Stated Pain Goal: 6 (70/62/37 6283)  Complications: No complications documented.

## 2020-07-22 NOTE — Progress Notes (Signed)
After reviewing the discharge information with the patient and his fiance, patient was discharged after no further questions. When RN went back into room, patient left his discharge paperwork under some blankets.  RN called and spoke with patient's fiance who stated that they were aware that they left it and that the patient was too tired to return for it. RN informed fiance of follow up appointment and again asked if they had any questions which she didn't.

## 2020-07-22 NOTE — Op Note (Signed)
DATE OF PROCEDURE:  07/22/2020                              OPERATIVE REPORT  SURGEON:  Leta Baptist, MD  PREOPERATIVE DIAGNOSES: 1. Bilateral eustachian tube dysfunction. 2. Bilateral chronic otitis media with effusion.  POSTOPERATIVE DIAGNOSES: 1. Bilateral eustachian tube dysfunction. 2. Bilateral chronic otitis media with effusion.  PROCEDURE PERFORMED: 1) Bilateral myringotomy and tube placement.          ANESTHESIA:  General facemask anesthesia.  COMPLICATIONS:  None.  ESTIMATED BLOOD LOSS:  Minimal.  INDICATION FOR PROCEDURE:   Raymond Malone is a 45 y.o. male with a history of chronic middle ear effusion and conductive hearing loss. On examination, the patient was noted to have middle ear effusion bilaterally.  Based on the above findings, the decision was made for the patient to undergo the myringotomy and tube placement procedure. Likelihood of success in reducing symptoms was also discussed.  The risks, benefits, alternatives, and details of the procedure were discussed with the patient.  Questions were invited and answered.  Informed consent was obtained.  DESCRIPTION:  The patient was taken to the operating room and placed supine on the operating table.  General LMA anesthesia was administered by the anesthesiologist.  Under the operating microscope, the right ear canal was cleaned of all cerumen.  The tympanic membrane was noted to be intact but mildly retracted.  A standard myringotomy incision was made at the anterior-inferior quadrant on the tympanic membrane.  A copious amount of serous fluid was suctioned from behind the tympanic membrane. A T tube was placed, followed by antibiotic eardrops in the ear canal.  The same procedure was repeated on the left side without exception. The care of the patient was turned over to the anesthesiologist.  The patient was awakened from anesthesia without difficulty.  The patient was transferred to the recovery room in good  condition.  OPERATIVE FINDINGS:  A copious amount of serous effusion was noted bilaterally.  SPECIMEN:  None.  FOLLOWUP CARE:  The patient will follow up in my office in approximately 4 weeks.  Cleona Doubleday [facility] 07/22/2020

## 2020-07-22 NOTE — Anesthesia Postprocedure Evaluation (Signed)
Anesthesia Post Note  Patient: Zeb L Scheer  Procedure(s) Performed: BILATERAL MYRINGOTOMY WITH TUBE PLACEMENT (Bilateral )     Patient location during evaluation: PACU Anesthesia Type: General Level of consciousness: awake and alert Pain management: pain level controlled Vital Signs Assessment: post-procedure vital signs reviewed and stable Respiratory status: spontaneous breathing, nonlabored ventilation, respiratory function stable and patient connected to nasal cannula oxygen Cardiovascular status: blood pressure returned to baseline and stable Postop Assessment: no apparent nausea or vomiting Anesthetic complications: no   No complications documented.  Last Vitals:  Vitals:   07/22/20 0845 07/22/20 0909  BP: 128/84 123/83  Pulse: 88 88  Resp: 19 18  Temp:  36.5 C  SpO2: 96% 98%    Last Pain:  Vitals:   07/22/20 0909  TempSrc:   PainSc: 0-No pain                 Laquandra Carrillo

## 2020-07-23 ENCOUNTER — Encounter (HOSPITAL_BASED_OUTPATIENT_CLINIC_OR_DEPARTMENT_OTHER): Payer: Self-pay | Admitting: Otolaryngology

## 2020-08-26 ENCOUNTER — Other Ambulatory Visit: Payer: Self-pay | Admitting: Family Medicine

## 2020-08-26 DIAGNOSIS — E119 Type 2 diabetes mellitus without complications: Secondary | ICD-10-CM

## 2020-08-26 DIAGNOSIS — E785 Hyperlipidemia, unspecified: Secondary | ICD-10-CM

## 2020-08-26 DIAGNOSIS — I10 Essential (primary) hypertension: Secondary | ICD-10-CM

## 2020-08-29 ENCOUNTER — Ambulatory Visit (INDEPENDENT_AMBULATORY_CARE_PROVIDER_SITE_OTHER): Payer: Medicaid Other

## 2020-08-29 ENCOUNTER — Encounter: Payer: Self-pay | Admitting: Cardiology

## 2020-08-29 ENCOUNTER — Other Ambulatory Visit: Payer: Self-pay

## 2020-08-29 ENCOUNTER — Telehealth: Payer: Self-pay | Admitting: Cardiology

## 2020-08-29 ENCOUNTER — Ambulatory Visit (INDEPENDENT_AMBULATORY_CARE_PROVIDER_SITE_OTHER): Payer: Medicaid Other | Admitting: Cardiology

## 2020-08-29 ENCOUNTER — Other Ambulatory Visit: Payer: Self-pay | Admitting: Cardiology

## 2020-08-29 VITALS — BP 128/88 | HR 85 | Ht 71.0 in | Wt 242.0 lb

## 2020-08-29 DIAGNOSIS — R002 Palpitations: Secondary | ICD-10-CM

## 2020-08-29 DIAGNOSIS — E1165 Type 2 diabetes mellitus with hyperglycemia: Secondary | ICD-10-CM | POA: Diagnosis not present

## 2020-08-29 DIAGNOSIS — I10 Essential (primary) hypertension: Secondary | ICD-10-CM | POA: Diagnosis not present
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
## 2020-08-29 NOTE — Patient Instructions (Addendum)
Medication Instructions:   Your physician recommends that you continue on your current medications as directed. Please refer to the Current Medication list given to you today.  Labwork:  none  Testing/Procedures: ZIO- Long Term Monitor Instructions   Your physician has requested you wear your ZIO patch monitor 7 days.   This is a single patch monitor.  [employer] supplies one patch monitor per enrollment.  Additional stickers are not available.   Please do not apply patch if you will be having a Nuclear Stress Test, Echocardiogram, Cardiac CT, MRI, or Chest Xray during the time frame you would be wearing the monitor. The patch cannot be worn during these tests.  You cannot remove and re-apply the ZIO XT patch monitor.     Once you have received you monitor, please review enclosed instructions.  Your monitor has already been registered assigning a specific monitor serial # to you.   Applying the monitor   Shave hair from upper left chest.   Hold abrader disc by orange tab.  Rub abrader in 40 strokes over left upper chest as indicated in your monitor instructions.   Clean area with 4 enclosed alcohol pads .  Use all pads to assure are is cleaned thoroughly.  Let dry.   Apply patch as indicated in monitor instructions.  Patch will be place under collarbone on left side of chest with arrow pointing upward.   Rub patch adhesive wings for 2 minutes.Remove white label marked "1".  Remove white label marked "2".  Rub patch adhesive wings for 2 additional minutes.   While looking in a mirror, press and release button in center of patch.  A small green light will flash 3-4 times .  This will be your only indicator the monitor has been turned on.     Do not shower for the first 24 hours.  You may shower after the first 24 hours.   Press button if you feel a symptom. You will hear a small click.  Record Date, Time and Symptom in the Patient Log Book.   When you are ready to remove patch,  follow instructions on last 2 pages of Patient Log Book.  Stick patch monitor onto last page of Patient Log Book.   Place Patient Log Book in [place] box.  Use locking tab on box and tape box closed securely.  The [employer] and [employer] has [employer] on it.  Please place in mailbox as soon as possible.  Your physician should have your test results approximately 7 days after the monitor has been mailed back to [facility].   Call [facility] at [phone] if you have questions regarding your ZIO XT patch monitor.  Call them immediately if you see an orange light blinking on your monitor.    If your monitor falls off in less than 4 days contact our Monitor department at [phone].  If your monitor becomes loose or falls off after 4 days call [employer] at [phone] for suggestions on securing your monitor.  Follow-Up:  Your physician recommends that you schedule a follow-up appointment in:   Any Other Special Instructions Will Be Listed Below (If Applicable).  If you need a refill on your cardiac medications before your next appointment, please call your pharmacy.

## 2020-08-29 NOTE — Telephone Encounter (Signed)
PERCERT:  7 Day ZIO XT/[place]

## 2020-09-03 ENCOUNTER — Ambulatory Visit: Payer: Medicaid Other | Admitting: Nurse Practitioner

## 2020-09-09 ENCOUNTER — Other Ambulatory Visit: Payer: Self-pay

## 2020-09-09 ENCOUNTER — Encounter: Payer: Self-pay | Admitting: Nurse Practitioner

## 2020-09-09 ENCOUNTER — Ambulatory Visit: Payer: Medicaid Other | Admitting: Nurse Practitioner

## 2020-09-09 DIAGNOSIS — E119 Type 2 diabetes mellitus without complications: Secondary | ICD-10-CM

## 2020-09-09 DIAGNOSIS — Z794 Long term (current) use of insulin: Secondary | ICD-10-CM

## 2020-09-09 MED ORDER — INSULIN ASPART 100 UNIT/ML FLEXPEN: PEN_INJECTOR | SUBCUTANEOUS | 11 refills | Status: DC

## 2020-09-09 MED ORDER — BLOOD GLUCOSE METER KIT: PACK | 0 refills | Status: DC

## 2020-09-09 NOTE — Assessment & Plan Note (Signed)
-  A1c in office was 12.8% at last OV (07/03/20) -he states he has been using metformin and glipizide -was prescribed novolog per high dose sliding scale at last OV, but he ran out several weeks ago -he has no blood sugar logs with him and states his meter is broken -Taking lantus 16 units daily (qPM) -on ACEi and statin -sent Rx. For meter To [facility] -Rx. novolog per high dose sliding scale

## 2020-09-09 NOTE — Patient Instructions (Signed)
Please come to your next appointment fasting so we can collect fasting labs.  That means, nothing to eat or drink after midnight except for water, black coffee, and your morning medicines.
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
## 2020-09-10 ENCOUNTER — Telehealth: Payer: Self-pay

## 2020-09-10 ENCOUNTER — Other Ambulatory Visit: Payer: Self-pay

## 2020-09-10 DIAGNOSIS — E119 Type 2 diabetes mellitus without complications: Secondary | ICD-10-CM

## 2020-09-10 DIAGNOSIS — Z794 Long term (current) use of insulin: Secondary | ICD-10-CM

## 2020-09-10 MED ORDER — BLOOD GLUCOSE METER KIT: PACK | 0 refills | Status: DC

## 2020-09-10 NOTE — Telephone Encounter (Signed)
Patient called said that [facility] did not receive the blood glucose meter kit and supplies.  Patient cb# [phone]

## 2020-09-10 NOTE — Telephone Encounter (Signed)
Rx resent.

## 2020-09-12 ENCOUNTER — Telehealth: Payer: Self-pay | Admitting: *Deleted

## 2020-09-12 DIAGNOSIS — I471 Supraventricular tachycardia: Secondary | ICD-10-CM

## 2020-09-12 NOTE — Telephone Encounter (Signed)
zio called to report pt episode of SVT on 4/22 @ 8:34pm HR 188 lasting 60 seconds - monitor uploaded and routed to provider

## 2020-09-12 NOTE — Telephone Encounter (Signed)
See final report with recommendations.

## 2020-09-13 ENCOUNTER — Telehealth: Payer: Self-pay | Admitting: *Deleted

## 2020-09-13 MED ORDER — METOPROLOL SUCCINATE ER 25 MG PO TB24: 25.0000 mg | ORAL_TABLET | Freq: Every day | ORAL | 1 refills | Status: DC

## 2020-09-13 NOTE — Telephone Encounter (Signed)
Pt voiced understanding - Toprol XL sent to pharmacy and EP referral placed

## 2020-09-13 NOTE — Telephone Encounter (Signed)
-----   Message from Satira Sark, MD sent at 09/12/2020  4:40 PM EDT ----- Results reviewed.  Overall cardiac rhythm normal, but he did have an episode of sustained SVT lasting a minute and 49 seconds.  Suggest starting Toprol-XL 25 mg daily and also referring him for EP consultation to establish follow-up in case ablation needs to be considered at some point.

## 2020-09-13 NOTE — Telephone Encounter (Signed)
Pt voiced understanding - Toprol XL sent to pharmacy and referral placed for EP

## 2020-09-13 NOTE — Telephone Encounter (Signed)
Results reviewed. Overall cardiac rhythm normal, but he did have an episode of sustained SVT lasting a minute and 49 seconds. Suggest starting Toprol-XL 25 mg daily and also referring him for EP consultation to establish follow-up in case ablation needs to be considered at some point.

## 2020-09-30 ENCOUNTER — Other Ambulatory Visit: Payer: Self-pay | Admitting: *Deleted

## 2020-09-30 ENCOUNTER — Telehealth: Payer: Self-pay

## 2020-09-30 MED ORDER — LANTUS SOLOSTAR 100 UNIT/ML ~~LOC~~ SOPN: 16.0000 [IU] | PEN_INJECTOR | Freq: Every day | SUBCUTANEOUS | 99 refills | Status: DC

## 2020-09-30 NOTE — Telephone Encounter (Signed)
Pt medication sent to pharmacy

## 2020-09-30 NOTE — Telephone Encounter (Signed)
Patient called need med refill   insulin glargine (LANTUS SOLOSTAR) 100 UNIT/ML Solostar Pen  and also needs another meter, his is no longer working.  Pharmacy:  [facility]

## 2020-10-08 ENCOUNTER — Other Ambulatory Visit: Payer: Self-pay

## 2020-10-08 ENCOUNTER — Encounter: Payer: Self-pay | Admitting: Nurse Practitioner

## 2020-10-08 ENCOUNTER — Ambulatory Visit: Payer: Medicaid Other | Admitting: Nurse Practitioner

## 2020-10-08 VITALS — BP 116/77 | HR 77 | Temp 98.8°F | Resp 20 | Ht 71.0 in | Wt 240.0 lb

## 2020-10-08 DIAGNOSIS — Z794 Long term (current) use of insulin: Secondary | ICD-10-CM

## 2020-10-08 DIAGNOSIS — E119 Type 2 diabetes mellitus without complications: Secondary | ICD-10-CM | POA: Diagnosis not present

## 2020-10-08 DIAGNOSIS — Z139 Encounter for screening, unspecified: Secondary | ICD-10-CM

## 2020-10-08 MED ORDER — FREESTYLE LIBRE 2 SENSOR MISC: 1.0000 | 3 refills | Status: DC

## 2020-10-08 MED ORDER — FREESTYLE LIBRE 2 READER DEVI: 1.0000 | Freq: Once | 0 refills | Status: AC

## 2020-10-08 NOTE — Patient Instructions (Signed)
Please have fasting labs drawn 2-3 days prior to your appointment so we can discuss the results during your office visit.
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
## 2020-10-08 NOTE — Assessment & Plan Note (Addendum)
-  he has had trouble operating his meter, and states it only works for 3-4 days and he has gotten a new one 3-4 times since his last appointment -Rx. Freestyle libre 2 for continuous glucose monitoring -referral to endocrinology -he has been having trouble with multiple meters, but didn't bring his meter in today -he has been injecting himself with novolog without knowing what his readings have been; we discussed how dangerous this is and that insulin overdose can be fatal -CBG 163 today

## 2020-10-29 ENCOUNTER — Ambulatory Visit (INDEPENDENT_AMBULATORY_CARE_PROVIDER_SITE_OTHER): Payer: Medicaid Other | Admitting: Internal Medicine

## 2020-10-29 ENCOUNTER — Other Ambulatory Visit: Payer: Self-pay

## 2020-10-29 ENCOUNTER — Encounter: Payer: Self-pay | Admitting: Internal Medicine

## 2020-10-29 VITALS — BP 120/86 | HR 86 | Ht 71.0 in | Wt 246.2 lb

## 2020-10-29 DIAGNOSIS — I471 Supraventricular tachycardia: Secondary | ICD-10-CM | POA: Diagnosis not present

## 2020-10-29 NOTE — Progress Notes (Signed)
HPI [name] is referred today for SVT. He has a h/o HTN and notes that he has had tachy-palpitations for over 20 years. They start and stop suddenly and usually can be managed with vagal maneuvers. He has been placed on metoprolol and his symptoms have improved but not resolved. He gets dizzy and lightheaded when he goes into SVT. He has worn a monitor demonstrating a short RP tachy at 170/min.  No Known Allergies   Current Outpatient Medications  Medication Sig Dispense Refill   blood glucose meter kit and supplies Dispense based on patient and insurance preference. Use to monitor blood sugar daily as directed. (FOR ICD-10 E10.9, E11.9). 1 each 0   Continuous Blood Gluc Sensor (FREESTYLE LIBRE 2 SENSOR) MISC Inject 1 Device into the skin every 14 (fourteen) days. Use to monitor blood sugar at least 4 times per day. 2 each 3   glipiZIDE (GLUCOTROL) 10 MG tablet TAKE 1 TABLET BY MOUTH ONCE DAILY BEFORE BREAKFAST 90 tablet 0   insulin aspart (NOVOLOG) 100 UNIT/ML FlexPen Take per sliding scale before meals.  For blood sugar 70-139 take 0 units. For blood sugar 140-180, take 4 units. For blood sugar, 180-240 take 6 units. For blood sugar 241-300, take 8 units. For blood sugar 301-350, take 10 units. For 351-400, take 12 units. For blood sugar > 400, take 14 units and call MD. 15 mL 11   insulin glargine (LANTUS SOLOSTAR) 100 UNIT/ML Solostar Pen Inject 16 Units into the skin daily. 15 mL PRN   Insulin Pen Needle (PEN NEEDLES) 31G X 5 MM MISC 1 each by Does not apply route in the morning, at noon, in the evening, and at bedtime. 200 each 3   lisinopril-hydrochlorothiazide (ZESTORETIC) 20-25 MG tablet Take 1 tablet by mouth once daily 90 tablet 0   metFORMIN (GLUCOPHAGE) 1000 MG tablet TAKE 1 TABLET BY MOUTH TWICE DAILY WITH A MEAL 180 tablet 0   metoprolol succinate (TOPROL XL) 25 MG 24 hr tablet Take 1 tablet (25 mg total) by mouth daily. 90 tablet 1   pantoprazole (PROTONIX) 20 MG tablet  Take 1 tablet (20 mg total) by mouth daily. 90 tablet 1   simvastatin (ZOCOR) 40 MG tablet TAKE 1 TABLET BY MOUTH ONCE DAILY EVERY EVENING 90 tablet 0   No current facility-administered medications for this visit.     Past Medical History:  Diagnosis Date   Abscess, axilla 10/06/2016   Alcohol dependence ([place]) 10/06/2016   Dyshidrotic eczema 10/27/2016   GERD (gastroesophageal reflux disease)    Hyperlipidemia    Hyperosmolar non-ketotic state in patient with type 2 diabetes mellitus ([place]) 10/06/2016   Hypertension    MRSA (methicillin resistant Staphylococcus aureus)    Skin abscess    Type 2 diabetes mellitus ([place])     ROS:   All systems reviewed and negative except as noted in the HPI.   Past Surgical History:  Procedure Laterality Date   ANKLE SURGERY     HAND SURGERY     boxer's fracture L hand   INCISION AND DRAINAGE PERIRECTAL ABSCESS     MYRINGOTOMY WITH TUBE PLACEMENT Bilateral 07/22/2020   Procedure: BILATERAL MYRINGOTOMY WITH TUBE PLACEMENT;  Surgeon: Leta Baptist, MD;  Location: [facility];  Service: ENT;  Laterality: Bilateral;     Family History  Problem Relation Age of Onset   Hypertension Mother      Social History   Socioeconomic History   Marital status: Significant Other  Spouse name: Shameka   Number of children: 7   Years of education: 14   Highest education level: Not on file  Occupational History   Occupation: factory work, sorting  Tobacco Use   Smoking status: Every Day    Packs/day: 1.00    Pack years: 0.00    Types: Cigarettes    Start date: 05/11/1994   Smokeless tobacco: Never  Vaping Use   Vaping Use: Never used  Substance and Sexual Activity   Alcohol use: Not Currently    Comment: Occasional   Drug use: Yes    Types: Marijuana    Comment: last=yesterday   Sexual activity: Yes    Birth control/protection: Condom  Other Topics Concern   Not on file  Social History Narrative   Lives with fiancee, mother, and 7  children, 3 are biological his, 2 are hers and 1 nephew   No pets in the home      Enjoys: spending time with the kids      Diet: Eats all food groups, does not have a true focus for diabetes   Caffeine: Drinks about 1 or 2 sodas a week overall has reduced his caffeine intake sometimes some tea   Water: Reports taking 6-8 times a day for more      Wears a seatbelt, does not use his phone or driving   Smoke detectors at home   Does not have fire extinguisher at this time   No weapons in the home   Social Determinants of Health   Financial Resource Strain: Low Risk    Difficulty of Paying Living Expenses: Not hard at all  Food Insecurity: No Food Insecurity   Worried About Charity fundraiser in the Last Year: Never true   Arboriculturist in the Last Year: Never true  Transportation Needs: No Transportation Needs   Lack of Transportation (Medical): No   Lack of Transportation (Non-Medical): No  Physical Activity: Inactive   Days of Exercise per Week: 0 days   Minutes of Exercise per Session: 0 min  Stress: Stress Concern Present   Feeling of Stress : To some extent  Social Connections: Moderately Isolated   Frequency of Communication with Friends and Family: More than three times a week   Frequency of Social Gatherings with Friends and Family: More than three times a week   Attends Religious Services: Never   Marine scientist or Organizations: No   Attends Music therapist: Never   Marital Status: Living with partner  Intimate Partner Violence: Not At Risk   Fear of Current or Ex-Partner: No   Emotionally Abused: No   Physically Abused: No   Sexually Abused: No     BP 120/86   Pulse 86   Ht 5' 11"  (1.803 m)   Wt 246 lb 3.2 oz (111.7 kg)   SpO2 97%   BMI 34.34 kg/m   Physical Exam:  Well appearing NAD HEENT: Unremarkable Neck:  No JVD, no thyromegally Lymphatics:  No adenopathy Back:  No CVA tenderness Lungs:  Clear HEART:  Regular rate  rhythm, no murmurs, no rubs, no clicks Abd:  soft, positive bowel sounds, no organomegally, no rebound, no guarding Ext:  2 plus pulses, no edema, no cyanosis, no clubbing Skin:  No rashes no nodules Neuro:  CN II through XII intact, motor grossly intact  EKG - NSR  Assess/Plan:  SVT - I have discussed the indications/risks/benefits/goals/expectations of EP study and ablation and  he will call us if he wishes to proceed. For now he will continue his beta blocker. HTN - his bp is controlled.   Carleene Overlie Sharissa Brierley,MD

## 2020-10-29 NOTE — Patient Instructions (Addendum)
Medication Instructions:  Your physician recommends that you continue on your current medications as directed. Please refer to the Current Medication list given to you today.  Stop Toprol XL Two Days prior to Ablation  *If you need a refill on your cardiac medications before your next appointment, please call your pharmacy*   Lab Work: Your physician recommends that you return for lab work in: [place], BMET  If you have labs (blood work) drawn today and your tests are completely normal, you will receive your results only by: [place] (if you have [place]) OR A paper copy in the mail If you have any lab test that is abnormal or we need to change your treatment, we will call you to review the results.   Testing/Procedures: Your physician has recommended that you have an ablation. Catheter ablation is a medical procedure used to treat some cardiac arrhythmias (irregular heartbeats). During catheter ablation, a long, thin, flexible tube is put into a blood vessel in your groin (upper thigh), or neck. This tube is called an ablation catheter. It is then guided to your heart through the blood vessel. Radio frequency waves destroy small areas of heart tissue where abnormal heartbeats may cause an arrhythmia to start. Please see the instruction sheet given to you today.    Follow-Up: At [facility], you and your health needs are our priority.  As part of our continuing mission to provide you with exceptional heart care, we have created designated Provider Care Teams.  These Care Teams include your primary Cardiologist (physician) and Advanced Practice Providers (APPs -  Physician Assistants and Nurse Practitioners) who all work together to provide you with the care you need, when you need it.  We recommend signing up for the patient portal called "MyChart".  Sign up information is provided on this After Visit Summary.  MyChart is used to connect with patients for Virtual Visits (Telemedicine).   Patients are able to view lab/test results, encounter notes, upcoming appointments, etc.  Non-urgent messages can be sent to your provider as well.   To learn more about what you can do with MyChart, go to [email or web address].    Your next appointment:    Dates for SVT Ablation July 8, July 18   The format for your next appointment:   In Person  Provider:   Cristopher Peru, MD   Other Instructions Thank you for choosing [facility]!

## 2020-11-07 ENCOUNTER — Ambulatory Visit: Payer: Medicaid Other | Admitting: Nurse Practitioner

## 2020-11-25 ENCOUNTER — Other Ambulatory Visit: Payer: Self-pay

## 2020-11-25 ENCOUNTER — Telehealth: Payer: Self-pay

## 2020-11-25 DIAGNOSIS — I10 Essential (primary) hypertension: Secondary | ICD-10-CM

## 2020-11-25 DIAGNOSIS — E785 Hyperlipidemia, unspecified: Secondary | ICD-10-CM

## 2020-11-25 DIAGNOSIS — E119 Type 2 diabetes mellitus without complications: Secondary | ICD-10-CM

## 2020-11-25 MED ORDER — LISINOPRIL-HYDROCHLOROTHIAZIDE 20-25 MG PO TABS: 1.0000 | ORAL_TABLET | Freq: Every day | ORAL | 0 refills | Status: DC

## 2020-11-25 MED ORDER — METFORMIN HCL 1000 MG PO TABS: 1000.0000 mg | ORAL_TABLET | Freq: Two times a day (BID) | ORAL | 0 refills | Status: DC

## 2020-11-25 MED ORDER — GLIPIZIDE 10 MG PO TABS: 10.0000 mg | ORAL_TABLET | Freq: Every day | ORAL | 0 refills | Status: DC

## 2020-11-25 MED ORDER — SIMVASTATIN 40 MG PO TABS: ORAL_TABLET | ORAL | 0 refills | Status: DC

## 2020-11-25 MED ORDER — BLOOD GLUCOSE METER KIT: PACK | 0 refills | Status: DC

## 2020-11-25 MED ORDER — LANTUS SOLOSTAR 100 UNIT/ML ~~LOC~~ SOPN: 16.0000 [IU] | PEN_INJECTOR | Freq: Every day | SUBCUTANEOUS | 99 refills | Status: DC

## 2020-11-25 MED ORDER — PEN NEEDLES 31G X 5 MM MISC: 1.0000 | Freq: Four times a day (QID) | 3 refills | Status: DC

## 2020-11-25 MED ORDER — INSULIN ASPART 100 UNIT/ML FLEXPEN: PEN_INJECTOR | SUBCUTANEOUS | 11 refills | Status: DC

## 2020-11-25 MED ORDER — PANTOPRAZOLE SODIUM 20 MG PO TBEC: 20.0000 mg | DELAYED_RELEASE_TABLET | Freq: Every day | ORAL | 1 refills | Status: DC

## 2020-11-25 MED ORDER — FREESTYLE LIBRE 2 SENSOR MISC: 1.0000 | 3 refills | Status: DC

## 2020-11-25 NOTE — Telephone Encounter (Signed)
Patient called need med refills on all meds lisinopril-hydrochlorothiazide (ZESTORETIC) 20-25 MG tablet  metFORMIN (GLUCOPHAGE) 1000 MG tablet    glipiZIDE (GLUCOTROL) 10 MG tablet insulin glargine (LANTUS SOLOSTAR) 100 UNIT/ML Solostar Pen  ontinuous Blood Gluc Sensor (FREESTYLE LIBRE 2 SENSOR  blood glucose meter kit and supplies   insulin aspart (NOVOLOG) 100 UNIT/ML FlexPen   Insulin Pen Needle (PEN NEEDLES) 31G X 5 MM MISC   simvastatin (ZOCOR) 40 MG tablet   pantoprazole (PROTONIX) 20 MG tablet  Pharmacy: [facility]

## 2020-11-25 NOTE — Telephone Encounter (Signed)
Rxs sent

## 2021-02-10 ENCOUNTER — Other Ambulatory Visit: Payer: Self-pay

## 2021-02-10 ENCOUNTER — Ambulatory Visit: Payer: Medicaid Other | Admitting: Nurse Practitioner

## 2021-02-10 ENCOUNTER — Encounter: Payer: Self-pay | Admitting: Nurse Practitioner

## 2021-02-10 VITALS — BP 124/79 | HR 89 | Temp 99.4°F | Ht 71.0 in | Wt 253.0 lb

## 2021-02-10 DIAGNOSIS — Z23 Encounter for immunization: Secondary | ICD-10-CM

## 2021-02-10 DIAGNOSIS — Z794 Long term (current) use of insulin: Secondary | ICD-10-CM

## 2021-02-10 DIAGNOSIS — E785 Hyperlipidemia, unspecified: Secondary | ICD-10-CM

## 2021-02-10 DIAGNOSIS — E119 Type 2 diabetes mellitus without complications: Secondary | ICD-10-CM

## 2021-02-10 DIAGNOSIS — I10 Essential (primary) hypertension: Secondary | ICD-10-CM

## 2021-02-10 MED ORDER — BLOOD GLUCOSE METER KIT: PACK | 0 refills | Status: AC

## 2021-02-10 NOTE — Assessment & Plan Note (Signed)
-  goal LDL < 70 -taking simvastatin

## 2021-02-10 NOTE — Assessment & Plan Note (Signed)
-  he states his meter still doesn't work -was referred to endocrinology, but never made an appt -check labs -referral to pharmacy; will need assistance with CGM

## 2021-02-10 NOTE — Patient Instructions (Signed)
Please have fasting labs drawn this week.

## 2021-02-10 NOTE — Assessment & Plan Note (Signed)
BP Readings from Last 3 Encounters:  02/10/21 124/79  10/29/20 120/86  10/08/20 116/77   -well-controlled today

## 2021-02-10 NOTE — Progress Notes (Signed)
Acute Office Visit  Subjective:    Patient ID: Raymond Malone, male    DOB: 05-08-1976, 45 y.o.   MRN: [id number]  Chief Complaint  Patient presents with   Follow-up    Needs referral to endocrinologist. States he never heard from them after first referral.      HPI Patient is in today for lab follow-up. He didn't have labs drawn prior to his appointment.  He has DM and was referred to endocrinology previously. There are no notes from an endocrinology visit, and it appears the referral was closed.  His only concern is that his glucometer doesn't work, so he hasn't been able to check his blood sugar.  Past Medical History:  Diagnosis Date   Abscess, axilla 10/06/2016   Alcohol dependence ([place]) 10/06/2016   Dyshidrotic eczema 10/27/2016   GERD (gastroesophageal reflux disease)    Hyperlipidemia    Hyperosmolar non-ketotic state in patient with type 2 diabetes mellitus ([place]) 10/06/2016   Hypertension    MRSA (methicillin resistant Staphylococcus aureus)    Skin abscess    Type 2 diabetes mellitus ([place])     Past Surgical History:  Procedure Laterality Date   ANKLE SURGERY     HAND SURGERY     boxer's fracture L hand   INCISION AND DRAINAGE PERIRECTAL ABSCESS     MYRINGOTOMY WITH TUBE PLACEMENT Bilateral 07/22/2020   Procedure: BILATERAL MYRINGOTOMY WITH TUBE PLACEMENT;  Surgeon: Leta Baptist, MD;  Location: [facility];  Service: ENT;  Laterality: Bilateral;    Family History  Problem Relation Age of Onset   Hypertension Mother     Social History   Socioeconomic History   Marital status: Significant Other    Spouse name: Shameka   Number of children: 7   Years of education: 14   Highest education level: Not on file  Occupational History   Occupation: factory work, sorting  Tobacco Use   Smoking status: Every Day    Packs/day: 1.00    Types: Cigarettes    Start date: 05/11/1994   Smokeless tobacco: Never  Vaping Use   Vaping Use: Never used   Substance and Sexual Activity   Alcohol use: Not Currently    Comment: Occasional   Drug use: Yes    Types: Marijuana    Comment: last=yesterday   Sexual activity: Yes    Birth control/protection: Condom  Other Topics Concern   Not on file  Social History Narrative   Lives with fiancee, mother, and 7 children, 3 are biological his, 2 are hers and 1 nephew   No pets in the home      Enjoys: spending time with the kids      Diet: Eats all food groups, does not have a true focus for diabetes   Caffeine: Drinks about 1 or 2 sodas a week overall has reduced his caffeine intake sometimes some tea   Water: Reports taking 6-8 times a day for more      Wears a seatbelt, does not use his phone or driving   Smoke detectors at home   Does not have fire extinguisher at this time   No weapons in the home   Social Determinants of Health   Financial Resource Strain: Not on file  Food Insecurity: Not on file  Transportation Needs: Not on file  Physical Activity: Not on file  Stress: Not on file  Social Connections: Not on file  Intimate Partner Violence: Not on file  Outpatient Medications Prior to Visit  Medication Sig Dispense Refill   Continuous Blood Gluc Sensor (FREESTYLE LIBRE 2 SENSOR) MISC Inject 1 Device into the skin every 14 (fourteen) days. Use to monitor blood sugar at least 4 times per day. 2 each 3   glipiZIDE (GLUCOTROL) 10 MG tablet Take 1 tablet (10 mg total) by mouth daily before breakfast. 90 tablet 0   insulin aspart (NOVOLOG) 100 UNIT/ML FlexPen Take per sliding scale before meals.  For blood sugar 70-139 take 0 units. For blood sugar 140-180, take 4 units. For blood sugar, 180-240 take 6 units. For blood sugar 241-300, take 8 units. For blood sugar 301-350, take 10 units. For 351-400, take 12 units. For blood sugar > 400, take 14 units and call MD. 15 mL 11   insulin glargine (LANTUS SOLOSTAR) 100 UNIT/ML Solostar Pen Inject 16 Units into the skin daily. 15 mL PRN    Insulin Pen Needle (PEN NEEDLES) 31G X 5 MM MISC 1 each by Does not apply route in the morning, at noon, in the evening, and at bedtime. 200 each 3   lisinopril-hydrochlorothiazide (ZESTORETIC) 20-25 MG tablet Take 1 tablet by mouth daily. 90 tablet 0   metFORMIN (GLUCOPHAGE) 1000 MG tablet Take 1 tablet (1,000 mg total) by mouth 2 (two) times daily with a meal. 180 tablet 0   metoprolol succinate (TOPROL XL) 25 MG 24 hr tablet Take 1 tablet (25 mg total) by mouth daily. 90 tablet 1   pantoprazole (PROTONIX) 20 MG tablet Take 1 tablet (20 mg total) by mouth daily. 90 tablet 1   simvastatin (ZOCOR) 40 MG tablet TAKE 1 TABLET BY MOUTH ONCE DAILY EVERY EVENING 90 tablet 0   blood glucose meter kit and supplies Dispense based on patient and insurance preference. Use to monitor blood sugar daily as directed. (FOR ICD-10 E10.9, E11.9). 1 each 0   No facility-administered medications prior to visit.    No Known Allergies  Review of Systems  Constitutional: Negative.   Respiratory: Negative.    Cardiovascular: Negative.   Musculoskeletal: Negative.   Psychiatric/Behavioral: Negative.        Objective:    Physical Exam Constitutional:      Appearance: Normal appearance.  Cardiovascular:     Rate and Rhythm: Normal rate and regular rhythm.     Pulses: Normal pulses.     Heart sounds: Normal heart sounds.  Pulmonary:     Effort: Pulmonary effort is normal.     Breath sounds: Normal breath sounds.  Musculoskeletal:        General: Normal range of motion.  Neurological:     Mental Status: He is alert.  Psychiatric:        Mood and Affect: Mood normal.        Behavior: Behavior normal.        Thought Content: Thought content normal.        Judgment: Judgment normal.    BP 124/79 (BP Location: Left Arm, Patient Position: Sitting)   Pulse 89   Temp 99.4 F (37.4 C) (Oral)   Ht _0  (1.803 m)   Wt 253 lb 0.6 oz (114.8 kg)   SpO2 91%   BMI 35.29 kg/m  Wt Readings from Last 3  Encounters:  02/10/21 253 lb 0.6 oz (114.8 kg)  10/29/20 246 lb 3.2 oz (111.7 kg)  10/08/20 240 lb (108.9 kg)    Health Maintenance Due  Topic Date Due   COVID-19 Vaccine (1) Never done  FOOT EXAM  Never done   OPHTHALMOLOGY EXAM  Never done   Hepatitis C Screening  Never done   HEMOGLOBIN A1C  08/09/2020   INFLUENZA VACCINE  12/09/2020    There are no preventive care reminders to display for this patient.   Lab Results  Component Value Date   TSH 1.412 11/28/2018   Lab Results  Component Value Date   WBC 7.1 02/09/2020   HGB 15.1 02/09/2020   HCT 44.7 02/09/2020   MCV 86 02/09/2020   PLT 318 02/09/2020   Lab Results  Component Value Date   NA 131 (L) 07/19/2020   K 3.9 07/19/2020   CO2 24 07/19/2020   GLUCOSE 329 (H) 07/19/2020   BUN 10 07/19/2020   CREATININE 0.90 07/19/2020   BILITOT 0.5 02/09/2020   ALKPHOS 95 02/09/2020   AST 20 02/09/2020   ALT 30 02/09/2020   PROT 6.5 02/09/2020   ALBUMIN 4.3 02/09/2020   CALCIUM 9.1 07/19/2020   ANIONGAP 10 07/19/2020   Lab Results  Component Value Date   CHOL 163 02/08/2017   Lab Results  Component Value Date   HDL 38 (L) 02/08/2017   Lab Results  Component Value Date   LDLCALC 108 (H) 02/08/2017   Lab Results  Component Value Date   TRIG 81 02/08/2017   Lab Results  Component Value Date   CHOLHDL 4.3 02/08/2017   Lab Results  Component Value Date   HGBA1C 8.7 (A) 02/09/2020   HGBA1C 8.7 02/09/2020   HGBA1C 8.7 (A) 02/09/2020   HGBA1C 8.7 (A) 02/09/2020       Assessment & Plan:   Problem List Items Addressed This Visit       Cardiovascular and Mediastinum   Essential hypertension    BP Readings from Last 3 Encounters:  02/10/21 124/79  10/29/20 120/86  10/08/20 116/77  -well-controlled today      Relevant Orders   CBC with Differential/Platelet   CMP14+EGFR   Lipid Panel With LDL/HDL Ratio     Endocrine   Type 2 diabetes mellitus without complications ([place]) - Primary    -he  states his meter still doesn't work -was referred to endocrinology, but never made an appt -check labs -referral to pharmacy; will need assistance with CGM      Relevant Orders   AMB Referral to [facility]   CBC with Differential/Platelet   CMP14+EGFR   Lipid Panel With LDL/HDL Ratio   Hemoglobin A1c   Microalbumin / creatinine urine ratio   Ambulatory referral to Endocrinology     Other   Hyperlipidemia    -goal LDL < 70 -taking simvastatin      Relevant Orders   Lipid Panel With LDL/HDL Ratio     Meds ordered this encounter  Medications   blood glucose meter kit and supplies    Sig: Dispense based on patient and insurance preference. Use up to four times daily as directed. (FOR ICD-10 E10.9, E11.9).    Dispense:  1 each    Refill:  0    Order Specific Question:   Number of strips    Answer:   100    Order Specific Question:   Number of lancets    Answer:   [place], NP

## 2021-02-13 IMAGING — DX CHEST - 2 VIEW
2 series · 2 of 2 positions shown · non-contrast
Comparison: Single-view of the chest 06/09/2017. PA and lateral
chest 07/10/2016.

CLINICAL DATA: Chest discomfort and irregular heart beat today.

EXAM:
CHEST - 2 VIEW

[chest pa]
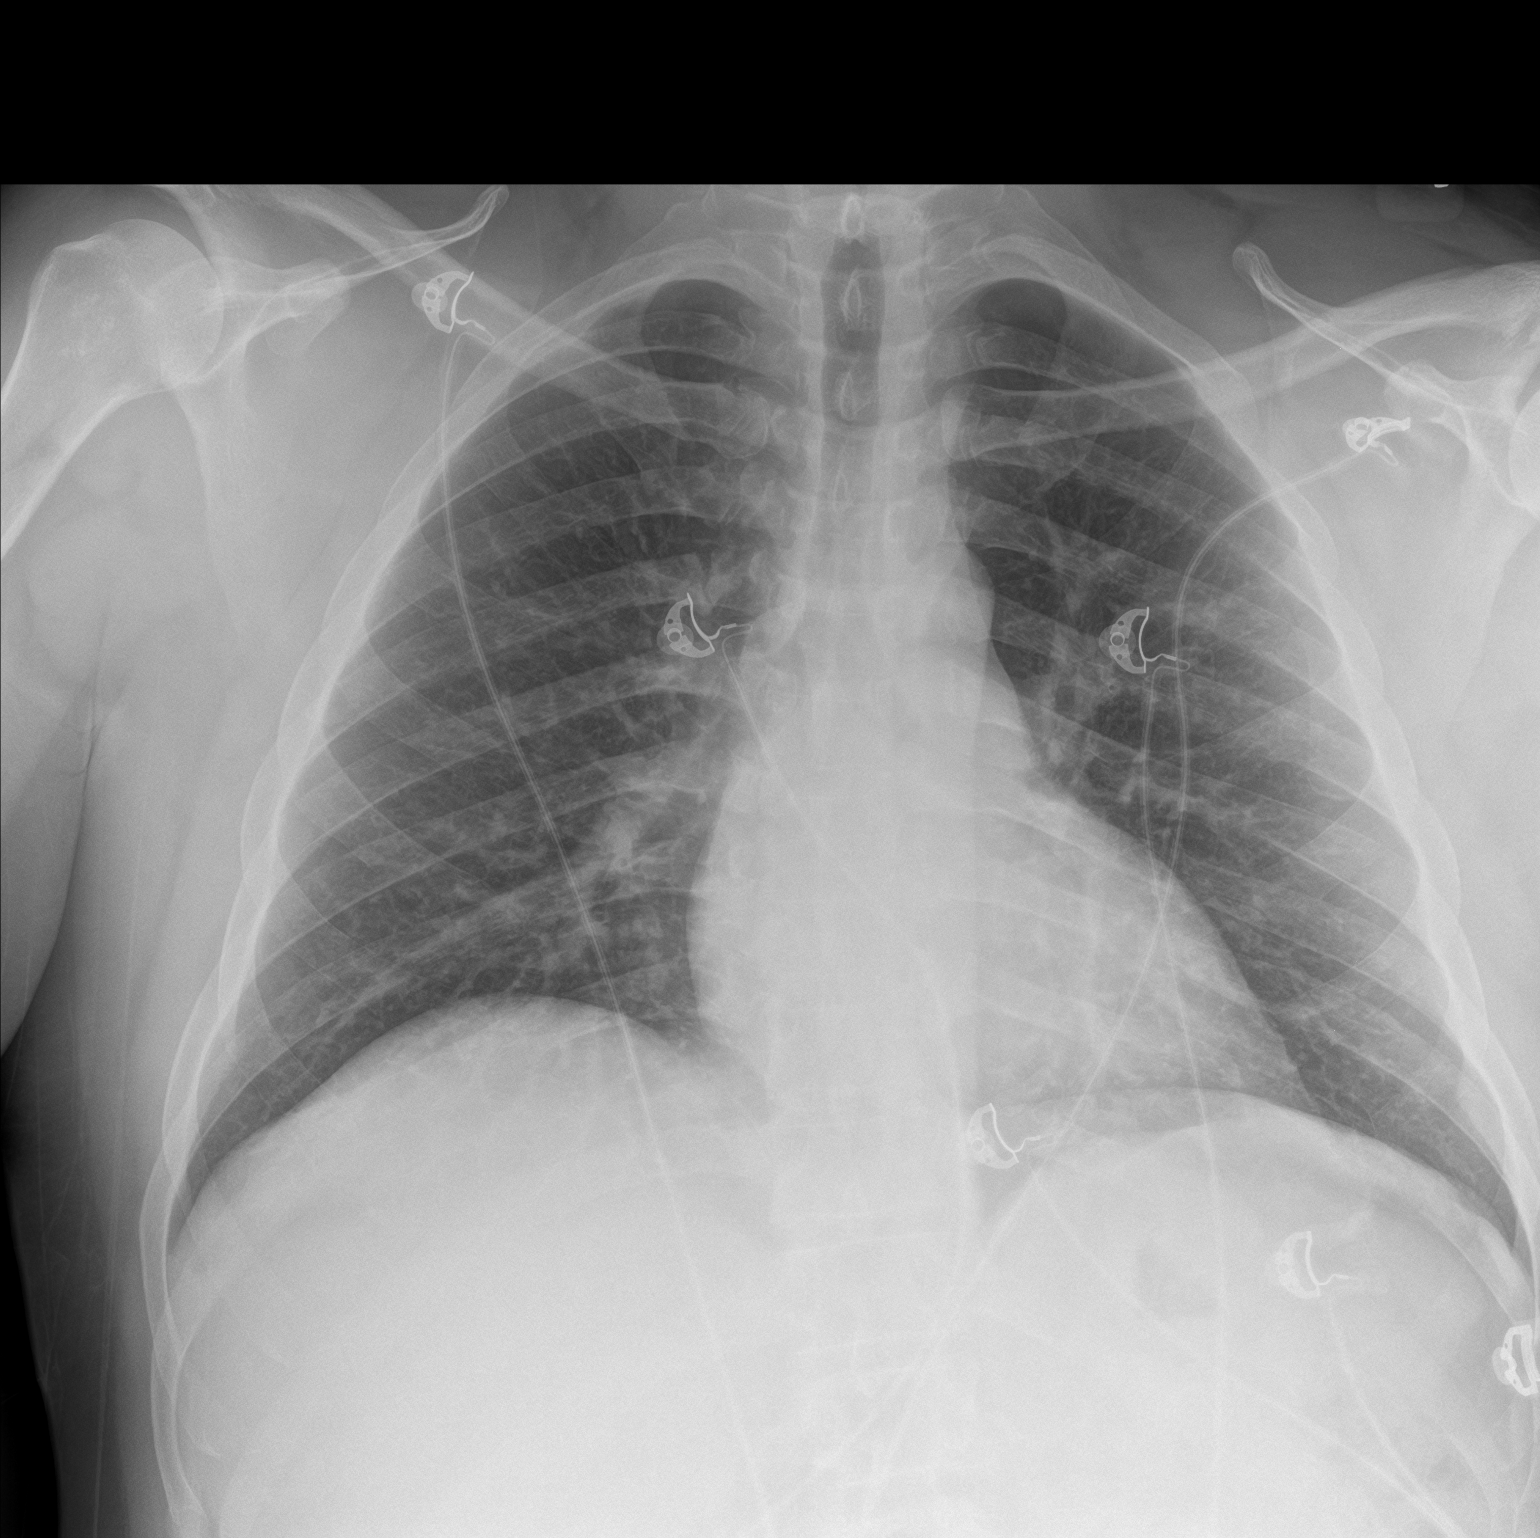

[chest lat]
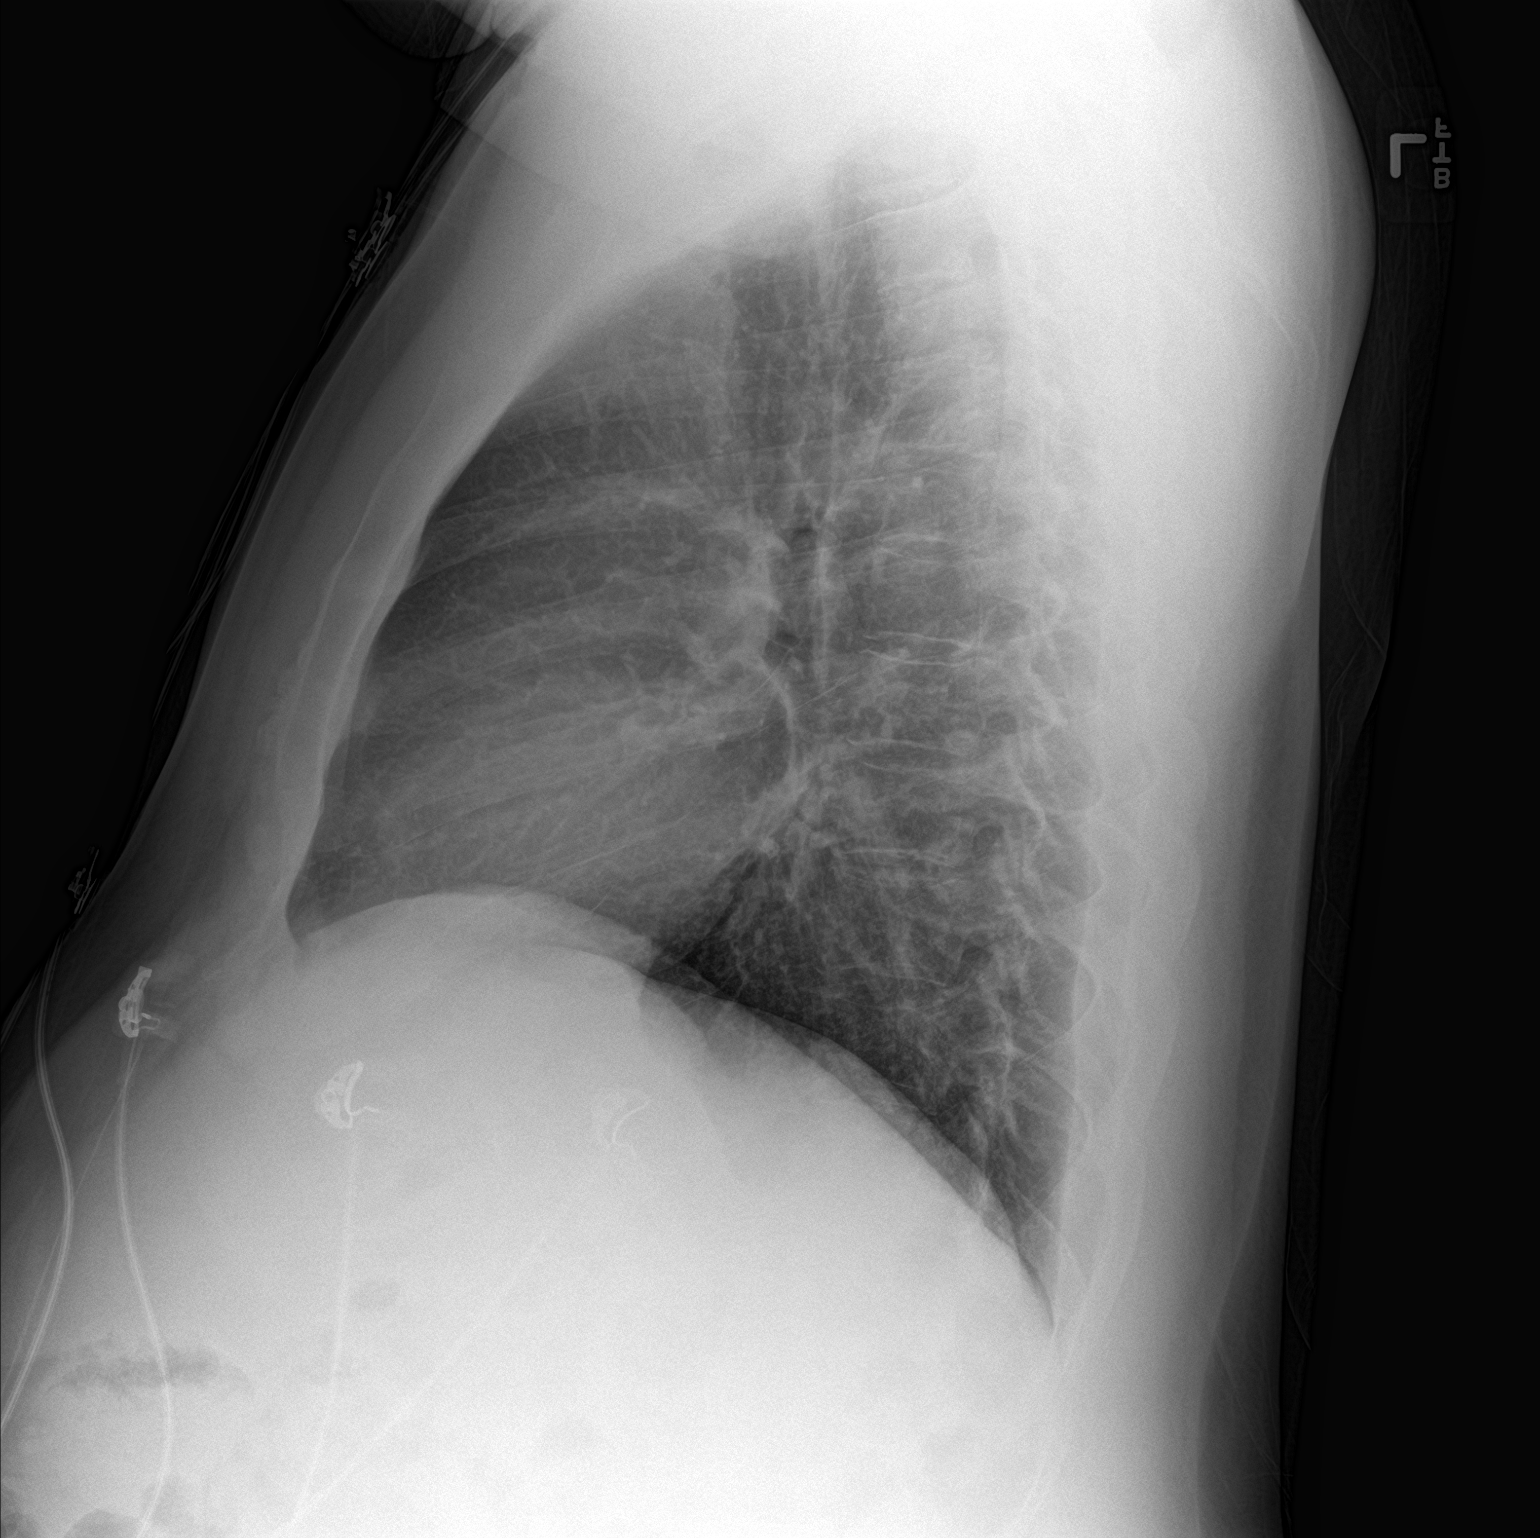

[2 of 2 positions shown; findings below may reference images not displayed]

FINDINGS: Lungs clear. Heart size normal. No pneumothorax or pleural effusion.
IMPRESSION: Negative chest.

## 2021-02-18 ENCOUNTER — Encounter (HOSPITAL_COMMUNITY): Payer: Self-pay | Admitting: Emergency Medicine

## 2021-02-18 ENCOUNTER — Emergency Department (HOSPITAL_COMMUNITY)
Admission: EM | Admit: 2021-02-18 | Discharge: 2021-02-18 | Disposition: A | Discharge status: Home / Self Care | Payer: Medicaid Other | Attending: Emergency Medicine | Admitting: Emergency Medicine

## 2021-02-18 ENCOUNTER — Other Ambulatory Visit: Payer: Self-pay

## 2021-02-18 DIAGNOSIS — Z794 Long term (current) use of insulin: Secondary | ICD-10-CM | POA: Diagnosis not present

## 2021-02-18 DIAGNOSIS — I10 Essential (primary) hypertension: Secondary | ICD-10-CM | POA: Diagnosis not present

## 2021-02-18 DIAGNOSIS — Z7984 Long term (current) use of oral hypoglycemic drugs: Secondary | ICD-10-CM | POA: Diagnosis not present

## 2021-02-18 DIAGNOSIS — E119 Type 2 diabetes mellitus without complications: Secondary | ICD-10-CM | POA: Insufficient documentation

## 2021-02-18 DIAGNOSIS — F17210 Nicotine dependence, cigarettes, uncomplicated: Secondary | ICD-10-CM | POA: Diagnosis not present

## 2021-02-18 DIAGNOSIS — L0231 Cutaneous abscess of buttock: Secondary | ICD-10-CM | POA: Diagnosis not present

## 2021-02-18 DIAGNOSIS — Z79899 Other long term (current) drug therapy: Secondary | ICD-10-CM | POA: Diagnosis not present

## 2021-02-18 MED ORDER — DOXYCYCLINE HYCLATE 100 MG PO CAPS: 100.0000 mg | ORAL_CAPSULE | Freq: Two times a day (BID) | ORAL | 0 refills | Status: DC

## 2021-02-18 MED ORDER — POVIDONE-IODINE 10 % EX SOLN
CUTANEOUS | Status: DC | PRN
  Filled 2021-02-18: qty 15

## 2021-02-18 MED ORDER — LIDOCAINE HCL (PF) 2 % IJ SOLN
INTRAMUSCULAR | Status: AC
  Filled 2021-02-18: qty 10

## 2021-02-18 MED ORDER — OXYCODONE-ACETAMINOPHEN 5-325 MG PO TABS
2.0000 | ORAL_TABLET | Freq: Once | ORAL | Status: AC
  Administered 2021-02-18: 2 via ORAL
  Filled 2021-02-18: qty 2

## 2021-02-18 MED ORDER — OXYCODONE-ACETAMINOPHEN 5-325 MG PO TABS: 1.0000 | ORAL_TABLET | Freq: Four times a day (QID) | ORAL | 0 refills | Status: DC | PRN

## 2021-02-18 MED ORDER — BACTROBAN NASAL 2 % NA OINT: TOPICAL_OINTMENT | NASAL | 0 refills | Status: DC

## 2021-02-18 NOTE — ED Triage Notes (Signed)
Pt reports abscess to the left buttock. Denies fevers.

## 2021-02-18 NOTE — Discharge Instructions (Addendum)
As discussed, warm water soaks 3 times a day until the area heals.  The packing will need to be removed in 2 days.  You may do this at home or return to the ER if needed.  Follow-up with your primary care provider for recheck.  Return to the emergency department if you develop any new or worsening symptoms.
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
## 2021-02-19 ENCOUNTER — Telehealth: Payer: Self-pay

## 2021-02-19 NOTE — Telephone Encounter (Signed)
Transition Care Management Follow-up Telephone Call Date of discharge and from where: 02/18/2021 from [facility] How have you been since you were released from the hospital? Pt stated that he is feeling better. Pt is picking up abx rx today. Pt stated that he would be okay to follow up with PCP office for wound check. I did inform pt that he could call the office to schedule. I am forwarding to PCP office to assist with scheduling.  Any questions or concerns? No  Items Reviewed: Did the pt receive and understand the discharge instructions provided? Yes  Medications obtained and verified? Yes  Other? No  Any new allergies since your discharge? No  Dietary orders reviewed? No Do you have support at home? Yes   Functional Questionnaire: (I = Independent and D = Dependent) ADLs: I  Bathing/Dressing- I  Meal Prep- I  Eating- I  Maintaining continence- I  Transferring/Ambulation- I  Managing Meds- I   Follow up appointments reviewed:  PCP Hospital f/u appt confirmed? No  Pt stated that he would be okay to follow up with PCP office for wound check. I did inform pt that he could call the office to schedule. I am forwarding to PCP office to assist with scheduling.  [facility] f/u appt confirmed? No   Are transportation arrangements needed? No  If their condition worsens, is the pt aware to call PCP or go to the Emergency Dept.? Yes Was the patient provided with contact information for the PCP's office or ED? Yes Was to pt encouraged to call back with questions or concerns? Yes

## 2021-02-26 ENCOUNTER — Other Ambulatory Visit: Payer: Self-pay | Admitting: Nurse Practitioner

## 2021-02-26 DIAGNOSIS — I10 Essential (primary) hypertension: Secondary | ICD-10-CM

## 2021-02-26 DIAGNOSIS — E785 Hyperlipidemia, unspecified: Secondary | ICD-10-CM

## 2021-02-26 DIAGNOSIS — Z794 Long term (current) use of insulin: Secondary | ICD-10-CM

## 2021-02-27 ENCOUNTER — Ambulatory Visit: Payer: Medicaid Other | Admitting: "Endocrinology

## 2021-03-05 ENCOUNTER — Other Ambulatory Visit: Payer: Self-pay

## 2021-03-05 ENCOUNTER — Ambulatory Visit (INDEPENDENT_AMBULATORY_CARE_PROVIDER_SITE_OTHER): Payer: Medicaid Other | Admitting: "Endocrinology

## 2021-03-05 ENCOUNTER — Encounter: Payer: Self-pay | Admitting: "Endocrinology

## 2021-03-05 VITALS — BP 124/78 | HR 88 | Ht 71.0 in | Wt 249.2 lb

## 2021-03-05 DIAGNOSIS — E119 Type 2 diabetes mellitus without complications: Secondary | ICD-10-CM

## 2021-03-05 DIAGNOSIS — Z794 Long term (current) use of insulin: Secondary | ICD-10-CM | POA: Diagnosis not present

## 2021-03-05 DIAGNOSIS — Z6834 Body mass index (BMI) 34.0-34.9, adult: Secondary | ICD-10-CM

## 2021-03-05 DIAGNOSIS — I10 Essential (primary) hypertension: Secondary | ICD-10-CM | POA: Diagnosis not present

## 2021-03-05 DIAGNOSIS — F17200 Nicotine dependence, unspecified, uncomplicated: Secondary | ICD-10-CM

## 2021-03-05 DIAGNOSIS — E66811 Obesity, class 1: Secondary | ICD-10-CM

## 2021-03-05 DIAGNOSIS — E6609 Other obesity due to excess calories: Secondary | ICD-10-CM | POA: Diagnosis not present

## 2021-03-05 DIAGNOSIS — E782 Mixed hyperlipidemia: Secondary | ICD-10-CM | POA: Diagnosis not present

## 2021-03-05 LAB — POCT GLYCOSYLATED HEMOGLOBIN (HGB A1C): HbA1c, POC (controlled diabetic range): 11.7 % — AB (ref 0.0–7.0)

## 2021-03-05 MED ORDER — ACCU-CHEK GUIDE ME W/DEVICE KIT: 1.0000 | PACK | 0 refills | Status: DC

## 2021-03-05 MED ORDER — ACCU-CHEK GUIDE VI STRP: ORAL_STRIP | 2 refills | Status: DC

## 2021-03-05 MED ORDER — INSULIN ASPART 100 UNIT/ML FLEXPEN: 1.0000 [IU] | PEN_INJECTOR | Freq: Three times a day (TID) | SUBCUTANEOUS | 11 refills | Status: DC

## 2021-03-05 MED ORDER — LANTUS SOLOSTAR 100 UNIT/ML ~~LOC~~ SOPN: 30.0000 [IU] | PEN_INJECTOR | Freq: Every day | SUBCUTANEOUS | 2 refills | Status: DC

## 2021-03-05 NOTE — Patient Instructions (Signed)
Advice for Weight Management  -For most of [place] the best way to lose weight is by diet management. Generally speaking, diet management means consuming less calories intentionally which over time brings about progressive weight loss.  This can be achieved more effectively by restricting carbohydrate consumption to the minimum possible.  So, it is critically important to know your numbers: how much calorie you are consuming and how much calorie you need. More importantly, our carbohydrates sources should be unprocessed or minimally processed complex starch food items.   Sometimes, it is important to balance nutrition by increasing protein intake (animal or plant source), fruits, and vegetables.  - Whole Food, Plant Predominant Nutrition is highly recommended: Eat Plenty of vegetables, Mushrooms, fruits, Legumes, Whole Grains, Nuts, seeds in lieu of processed meats, processed snacks/pastries red meat, poultry, eggs.  -Sticking to a routine mealtime to eat 3 meals a day and avoiding unnecessary snacks is shown to have a big role in weight control. Under normal circumstances, the only time we lose real weight is when we are hungry, so allow hunger to take place- hunger means no food between meal times, only water.  It is not advisable to starve.   -It is better to avoid simple carbohydrates including: Cakes, Sweet Desserts, Ice Cream, Soda (diet and regular), Sweet Tea, Candies, Chips, Cookies, Store Bought Juices, Alcohol in Excess of  1-2 drinks a day, Lemonade,  Artificial Sweeteners, Doughnuts, Coffee Creamers, "Sugar-free" Products, etc, etc.  This is not a complete list...[name].    -Consulting with certified diabetes educators is proven to provide you with the most accurate and current information on diet.  Also, you may be  interested in discussing diet options/exchanges , we can schedule a visit with Jearld Fenton, RDN, CDE for  individualized nutrition education.  -Exercise: If you are able: 30 -60 minutes a day ,4 days a week, or 150 minutes a week.  The longer the better.  Combine stretch, strength, and aerobic activities.  If you were told in the past that you have high risk for cardiovascular diseases, you may seek evaluation by your heart doctor prior to initiating moderate to intense exercise programs.                                  Additional Care Considerations for Diabetes   -Diabetes  is a chronic disease.  The most important care consideration is regular follow-up with your diabetes care provider with the goal being avoiding or delaying its complications and to take advantage of advances in medications and technology.    - Whole Food, Plant Predominant Nutrition is highly recommended: Eat Plenty of vegetables, Mushrooms, fruits, Legumes, Whole Grains, Nuts, seeds in lieu of processed meats, processed snacks/pastries red meat, poultry, eggs.  -Type 2 diabetes is known to coexist with other important comorbidities such as high blood pressure and high cholesterol.  It is critical to control not only the diabetes but also the high blood pressure and high cholesterol to minimize and delay the risk of complications including coronary artery disease, stroke, amputations, blindness, etc.    - Studies showed that people with diabetes will benefit from a class of medications known as ACE inhibitors and statins.  Unless  there are specific reasons not to be on these medications, the standard of care is to consider getting one from these groups of medications at an optimal doses.  These medications are generally considered safe and proven to help protect the heart and the kidneys.    - People with diabetes are encouraged to initiate and maintain regular follow-up with eye doctors, foot doctors, dentists , and if necessary heart and kidney doctors.     - It is highly recommended that people with diabetes quit smoking or  stay away from smoking, and get yearly  flu vaccine and pneumonia vaccine at least every 5 years.  One other important lifestyle recommendation is to ensure adequate sleep - at least 6-7 hours of uninterrupted sleep at night.  -Exercise: If you are able: 30 -60 minutes a day, 4 days a week, or 150 minutes a week.  The longer the better.  Combine stretch, strength, and aerobic activities.  If you were told in the past that you have high risk for cardiovascular diseases, you may seek evaluation by your heart doctor prior to initiating moderate to intense exercise programs.

## 2021-03-05 NOTE — Progress Notes (Signed)
Endocrinology Consult Note       03/05/2021, 1:12 PM   Subjective:    Patient ID: Raymond Malone, male    DOB: June 01, 1975.  Raymond Malone is being seen in consultation for management of currently uncontrolled symptomatic diabetes requested by  Noreene Larsson, NP.   Past Medical History:  Diagnosis Date   Abscess, axilla 10/06/2016   Alcohol dependence ([place]) 10/06/2016   Dyshidrotic eczema 10/27/2016   GERD (gastroesophageal reflux disease)    Hyperlipidemia    Hyperosmolar non-ketotic state in patient with type 2 diabetes mellitus ([place]) 10/06/2016   Hypertension    MRSA (methicillin resistant Staphylococcus aureus)    Skin abscess    Type 2 diabetes mellitus ([place])     Past Surgical History:  Procedure Laterality Date   ANKLE SURGERY     HAND SURGERY     boxer's fracture L hand   INCISION AND DRAINAGE PERIRECTAL ABSCESS     MYRINGOTOMY WITH TUBE PLACEMENT Bilateral 07/22/2020   Procedure: BILATERAL MYRINGOTOMY WITH TUBE PLACEMENT;  Surgeon: Leta Baptist, MD;  Location: [facility];  Service: ENT;  Laterality: Bilateral;    Social History   Socioeconomic History   Marital status: Significant Other    Spouse name: Shameka   Number of children: 7   Years of education: 14   Highest education level: Not on file  Occupational History   Occupation: factory work, sorting  Tobacco Use   Smoking status: Every Day    Packs/day: 1.00    Types: Cigarettes    Start date: 05/11/1994   Smokeless tobacco: Never  Vaping Use   Vaping Use: Never used  Substance and Sexual Activity   Alcohol use: Not Currently    Comment: Occasional   Drug use: Not Currently    Types: Marijuana    Comment: last=yesterday   Sexual activity: Yes    Birth control/protection: Condom  Other Topics Concern   Not on file  Social History Narrative   Lives with fiancee, mother, and 7 children, 3 are biological his, 2  are hers and 1 nephew   No pets in the home      Enjoys: spending time with the kids      Diet: Eats all food groups, does not have a true focus for diabetes   Caffeine: Drinks about 1 or 2 sodas a week overall has reduced his caffeine intake sometimes some tea   Water: Reports taking 6-8 times a day for more      Wears a seatbelt, does not use his phone or driving   Smoke detectors at home   Does not have fire extinguisher at this time   No weapons in the home   Social Determinants of Health   Financial Resource Strain: Not on file  Food Insecurity: Not on file  Transportation Needs: Not on file  Physical Activity: Not on file  Stress: Not on file  Social Connections: Not on file    Family History  Problem Relation Age of Onset   Hypertension Mother     Outpatient Encounter Medications as of 03/05/2021  Medication Sig  Blood Glucose Monitoring Suppl (ACCU-CHEK GUIDE ME) w/Device KIT 1 Piece by Does not apply route as directed.   glucose blood (ACCU-CHEK GUIDE) test strip Use to test glucose 4 times a day.   blood glucose meter kit and supplies Dispense based on patient and insurance preference. Use up to four times daily as directed. (FOR ICD-10 E10.9, E11.9).   doxycycline (VIBRAMYCIN) 100 MG capsule Take 1 capsule (100 mg total) by mouth 2 (two) times daily. (Patient not taking: Reported on 03/05/2021)   insulin aspart (NOVOLOG) 100 UNIT/ML FlexPen Inject 1 Units into the skin 3 (three) times daily with meals.   insulin glargine (LANTUS SOLOSTAR) 100 UNIT/ML Solostar Pen Inject 30 Units into the skin at bedtime.   Insulin Pen Needle (PEN NEEDLES) 31G X 5 MM MISC 1 each by Does not apply route in the morning, at noon, in the evening, and at bedtime.   lisinopril-hydrochlorothiazide (ZESTORETIC) 20-25 MG tablet Take 1 tablet by mouth once daily   metFORMIN (GLUCOPHAGE) 1000 MG tablet TAKE 1 TABLET BY MOUTH TWICE DAILY WITH MEALS   metoprolol succinate (TOPROL XL) 25 MG 24 hr  tablet Take 1 tablet (25 mg total) by mouth daily.   mupirocin nasal ointment (BACTROBAN NASAL) 2 % Apply to the affected area 3 times a day for 10 days. (Patient not taking: Reported on 03/05/2021)   oxyCODONE-acetaminophen (PERCOCET/ROXICET) 5-325 MG tablet Take 1 tablet by mouth every 6 (six) hours as needed for severe pain. (Patient not taking: Reported on 03/05/2021)   pantoprazole (PROTONIX) 20 MG tablet Take 1 tablet (20 mg total) by mouth daily.   simvastatin (ZOCOR) 40 MG tablet Take 1 tablet by mouth in the evening   [DISCONTINUED] Continuous Blood Gluc Sensor (FREESTYLE LIBRE 2 SENSOR) MISC Inject 1 Device into the skin every 14 (fourteen) days. Use to monitor blood sugar at least 4 times per day.   [DISCONTINUED] glipiZIDE (GLUCOTROL) 10 MG tablet TAKE 1 TABLET BY MOUTH ONCE DAILY BEFORE BREAKFAST   [DISCONTINUED] insulin aspart (NOVOLOG) 100 UNIT/ML FlexPen Take per sliding scale before meals.  For blood sugar 70-139 take 0 units. For blood sugar 140-180, take 4 units. For blood sugar, 180-240 take 6 units. For blood sugar 241-300, take 8 units. For blood sugar 301-350, take 10 units. For 351-400, take 12 units. For blood sugar > 400, take 14 units and call MD.   [DISCONTINUED] insulin glargine (LANTUS SOLOSTAR) 100 UNIT/ML Solostar Pen Inject 16 Units into the skin daily. (Patient taking differently: Inject 16 Units into the skin at bedtime.)   No facility-administered encounter medications on file as of 03/05/2021.    ALLERGIES: No Known Allergies  VACCINATION STATUS: Immunization History  Administered Date(s) Administered   Influenza,inj,Quad PF,6+ Mos 02/09/2020, 02/10/2021   Pneumococcal Polysaccharide-23 10/07/2016   Tdap 10/19/2012    Diabetes He presents for his initial diabetic visit. He has type 2 diabetes mellitus. Onset time: He was diagnosed at approximate age of 45 years. His disease course has been worsening (He was seen in 2018 immediately after his diagnosis,  separate from care.). There are no hypoglycemic associated symptoms. Pertinent negatives for hypoglycemia include no confusion, headaches, pallor or seizures. Associated symptoms include blurred vision, polydipsia and polyuria. Pertinent negatives for diabetes include no chest pain, no fatigue, no polyphagia and no weakness. There are no hypoglycemic complications. Symptoms are worsening. (Obesity, smoking.) Risk factors for coronary artery disease include dyslipidemia and hypertension. Current diabetic treatments: She is currently on Lantus 16 units, NovoLog 4-14 units 3  times daily AC, glipizide 10 mg p.o. daily, metformin 1000 mg p.o. twice daily. His weight is fluctuating minimally. He is following a generally unhealthy diet. When asked about meal planning, he reported none. He has not had a previous visit with a dietitian. He rarely participates in exercise. (He did not bring any logs nor meter with him.  His point-of-care A1c was found to be high at 11.7%.  Admittedly, he does not have functional meter.) An ACE inhibitor/angiotensin II receptor blocker is being taken.  Hyperlipidemia This is a chronic problem. The problem is resistant. Exacerbating diseases include diabetes and obesity. Pertinent negatives include no chest pain, myalgias or shortness of breath. Current antihyperlipidemic treatment includes statins. Risk factors for coronary artery disease include diabetes mellitus, dyslipidemia, family history, hypertension, male sex, obesity and a sedentary lifestyle.  Hypertension This is a chronic problem. The current episode started more than 1 year ago. Associated symptoms include blurred vision. Pertinent negatives include no chest pain, headaches, neck pain, palpitations or shortness of breath. Risk factors for coronary artery disease include dyslipidemia, diabetes mellitus, male gender, obesity, sedentary lifestyle, smoking/tobacco exposure and family history. Past treatments include ACE  inhibitors.    Review of Systems  Constitutional:  Negative for chills, fatigue, fever and unexpected weight change.  HENT:  Negative for dental problem, mouth sores and trouble swallowing.   Eyes:  Positive for blurred vision. Negative for visual disturbance.  Respiratory:  Negative for cough, choking, chest tightness, shortness of breath and wheezing.   Cardiovascular:  Negative for chest pain, palpitations and leg swelling.  Gastrointestinal:  Negative for abdominal distention, abdominal pain, constipation, diarrhea, nausea and vomiting.  Endocrine: Positive for polydipsia and polyuria. Negative for polyphagia.  Genitourinary:  Negative for dysuria, flank pain, hematuria and urgency.  Musculoskeletal:  Negative for back pain, gait problem, myalgias and neck pain.  Skin:  Negative for pallor, rash and wound.  Neurological:  Negative for seizures, syncope, weakness, numbness and headaches.  Psychiatric/Behavioral:  Negative for confusion and dysphoric mood.    Objective:    Vitals with BMI 03/05/2021 02/18/2021 02/10/2021  Height 5' 11" 5' 11" 5' 11"  Weight 249 lbs 3 oz 250 lbs 253 lbs 1 oz  BMI 34.77 34.88 35.31  Systolic 124 126 124  Diastolic 78 83 79  Pulse 88 97 89    BP 124/78   Pulse 88   Ht 5' 11" (1.803 m)   Wt 249 lb 3.2 oz (113 kg)   BMI 34.76 kg/m   Wt Readings from Last 3 Encounters:  03/05/21 249 lb 3.2 oz (113 kg)  02/18/21 250 lb (113.4 kg)  02/10/21 253 lb 0.6 oz (114.8 kg)     Physical Exam Constitutional:      General: He is not in acute distress.    Appearance: He is well-developed.  HENT:     Head: Normocephalic and atraumatic.  Neck:     Thyroid: No thyromegaly.     Trachea: No tracheal deviation.  Cardiovascular:     Rate and Rhythm: Normal rate.     Pulses:          Dorsalis pedis pulses are 1+ on the right side and 1+ on the left side.       Posterior tibial pulses are 1+ on the right side and 1+ on the left side.     Heart sounds: S1  normal and S2 normal. No murmur heard.   No gallop.  Pulmonary:     Effort: Pulmonary   effort is normal. No respiratory distress.     Breath sounds: No wheezing.  Abdominal:     General: There is no distension.     Tenderness: There is no guarding.  Musculoskeletal:     Right shoulder: No swelling or deformity.     Cervical back: Normal range of motion and neck supple.  Skin:    General: Skin is warm and dry.     Findings: No rash.     Nails: There is no clubbing.  Neurological:     Mental Status: He is alert and oriented to person, place, and time.     Cranial Nerves: No cranial nerve deficit.     Sensory: No sensory deficit.     Gait: Gait normal.     Deep Tendon Reflexes: Reflexes are normal and symmetric.  Psychiatric:        Speech: Speech normal.        Behavior: Behavior is cooperative.        Thought Content: Thought content normal.     Comments: Reluctant affect      CMP ( most recent) CMP     Component Value Date/Time   NA 131 (L) 07/19/2020 1114   NA 141 02/09/2020 1013   K 3.9 07/19/2020 1114   CL 97 (L) 07/19/2020 1114   CO2 24 07/19/2020 1114   GLUCOSE 329 (H) 07/19/2020 1114   BUN 10 07/19/2020 1114   BUN 14 02/09/2020 1013   CREATININE 0.90 07/19/2020 1114   CREATININE 1.02 02/08/2017 0852   CALCIUM 9.1 07/19/2020 1114   PROT 6.5 02/09/2020 1013   ALBUMIN 4.3 02/09/2020 1013   AST 20 02/09/2020 1013   ALT 30 02/09/2020 1013   ALKPHOS 95 02/09/2020 1013   BILITOT 0.5 02/09/2020 1013   GFRNONAA >60 07/19/2020 1114   GFRAA 114 02/09/2020 1013     Diabetic Labs (most recent): Lab Results  Component Value Date   HGBA1C 11.7 (A) 03/05/2021   HGBA1C 8.7 (A) 02/09/2020   HGBA1C 8.7 02/09/2020   HGBA1C 8.7 (A) 02/09/2020   HGBA1C 8.7 (A) 02/09/2020     Lipid Panel ( most recent) Lipid Panel     Component Value Date/Time   CHOL 163 02/08/2017 0852   TRIG 81 02/08/2017 0852   HDL 38 (L) 02/08/2017 0852   CHOLHDL 4.3 02/08/2017 0852    LDLCALC 108 (H) 02/08/2017 0852      Lab Results  Component Value Date   TSH 1.412 11/28/2018   TSH 2.00 02/08/2017   FREET4 1.3 02/08/2017      Assessment & Plan:   1. Type 2 diabetes mellitus without complication, with long-term current use of insulin (HCC)   - Raymond Malone has currently uncontrolled symptomatic type 2 DM since  45 years of age,  with most recent A1c of 11.7 %. Recent labs reviewed. - I had a long discussion with him about the progressive nature of diabetes and the pathology behind its complications.  He was seen in this practice in 2018, disappeared from care. -his diabetes is complicated by obesity/sedentary life, smoking, hypertension, hyperlipidemia, nonadherence and he remains at a high risk for more acute and chronic complications which include CAD, CVA, CKD, retinopathy, and neuropathy. These are all discussed in detail with him.  - I have counseled him on diet  and weight management  by adopting a carbohydrate restricted/protein rich diet. Patient is encouraged to switch to  unprocessed or minimally processed     complex starch   and increased protein intake (animal or plant source), fruits, and vegetables. -  he is advised to stick to a routine mealtimes to eat 3 meals  a day and avoid unnecessary snacks ( to snack only to correct hypoglycemia).   - he acknowledges that there is a room for improvement in his food and drink choices. - Suggestion is made for him to avoid simple carbohydrates  from his diet including Cakes, Sweet Desserts, Ice Cream, Soda (diet and regular), Sweet Tea, Candies, Chips, Cookies, Store Bought Juices, Alcohol in Excess of  1-2 drinks a day, Artificial Sweeteners,  Coffee Creamer, and "Sugar-free" Products. This will help patient to have more stable blood glucose profile and potentially avoid unintended weight gain.  - he will be scheduled with Jearld Fenton, RDN, CDE for diabetes education.  - I have approached him with the  following individualized plan to manage  his diabetes and patient agrees:   -In light of his chronic severe hyperglycemia, he will likely need multiple daily injections of insulin in order for him to achieve and maintain control of diabetes to target. - In preparation, he is approached to start strict monitoring of blood glucose 4 times a day-before meals and at bedtime and return in 10 days with his meter and logs.    -In the meantime, he is advised to increase his Lantus to 30 units nightly, hold NovoLog for now.   - he is warned not to take insulin without proper monitoring per orders.  - he is encouraged to call clinic for blood glucose levels less than 70 or above 200 mg /dl. - he is advised to continue metformin 1000 mg p.o. twice daily, therapeutically suitable for patient . -I discussed and lowered his glipizide to 5 mg XL p.o. daily at breakfast.  - he will be considered for incretin therapy as appropriate next visit.  - Specific targets for  A1c;  LDL, HDL,  and Triglycerides were discussed with the patient.  2) Blood Pressure /Hypertension:  his blood pressure is  controlled to target.   he is advised to continue his current medications including lisinopril/HCTZ 20/25 mg p.o. daily with breakfast . 3) Lipids/Hyperlipidemia:   Review of his recent lipid panel showed un controlled  LDL at 108 .  he  is advised to continue    simvastatin 40 mg daily at bedtime.  Side effects and precautions discussed with him.  4)  Weight/Diet:  Body mass index is 34.76 kg/m.  -   clearly complicating his diabetes care.   he is  a candidate for weight loss. I discussed with him the fact that loss of 5 - 10% of his  current body weight will have the most impact on his diabetes management.  Exercise, and detailed carbohydrates information provided  -  detailed on discharge instructions.  5) Chronic Care/Health Maintenance:  -he  is on ACEI/ARB and Statin medications and  is encouraged to initiate and  continue to follow up with Ophthalmology, Dentist,  Podiatrist at least yearly or according to recommendations, and advised to  quit smoking. I have recommended yearly flu vaccine and pneumonia vaccine at least every 5 years; moderate intensity exercise for up to 150 minutes weekly; and  sleep for at least 7 hours a day.  - he is  advised to maintain close follow up with Noreene Larsson, NP for primary care needs, as well as his other providers for optimal and coordinated care.   I spent 82 minutes in the  care of the patient today including review of labs from [facility], Lipids, Thyroid Function, Hematology (current and previous including abstractions from other facilities); face-to-face time discussing  his blood glucose readings/logs, discussing hypoglycemia and hyperglycemia episodes and symptoms, medications doses, his options of short and long term treatment based on the latest standards of care / guidelines;  discussion about incorporating lifestyle medicine;  and documenting the encounter.     Please refer to Patient Instructions for Blood Glucose Monitoring and Insulin/Medications Dosing Guide"  in media tab for additional information. Please  also refer to " Patient Self Inventory" in the Media  tab for reviewed elements of pertinent patient history.  Raymond Malone participated in the discussions, expressed understanding, and voiced agreement with the above plans.  All questions were answered to his satisfaction. he is encouraged to contact clinic should he have any questions or concerns prior to his return visit.   Follow up plan: - Return in about 10 days (around 03/15/2021) for F/U with Meter and Logs Only - no Labs.  Glade Lloyd, MD [facility] [address] [place], [place] [address] Phone: [phone]  Fax: [phone]    03/05/2021, 1:12 PM  This note was partially dictated with voice recognition software. Similar sounding words  can be transcribed inadequately or may not  be corrected upon review.

## 2021-03-11 ENCOUNTER — Telehealth: Payer: Self-pay | Admitting: Pharmacist

## 2021-03-11 ENCOUNTER — Ambulatory Visit: Payer: Self-pay

## 2021-03-11 NOTE — Patient Outreach (Signed)
03/11/2021 Name: Raymond Malone MRN: [id number] DOB: Apr 16, 1976  Referred by: Noreene Larsson, NP Reason for referral : No chief complaint on file.  Patient was successfully contacted today to discuss Managed Medicaid services. Patient did not want to participate in Managed Medicaid services at this time and did not give consent. Discussed with patient in the future if he changes his mind we can enroll him.  Follow Up Plan: No further action at this time.  Hughes Better PharmD, CPP High Risk Managed Medicaid [place] [phone]

## 2021-03-18 ENCOUNTER — Other Ambulatory Visit: Payer: Self-pay

## 2021-03-18 ENCOUNTER — Encounter: Payer: Self-pay | Admitting: "Endocrinology

## 2021-03-18 ENCOUNTER — Ambulatory Visit (INDEPENDENT_AMBULATORY_CARE_PROVIDER_SITE_OTHER): Payer: Medicaid Other | Admitting: "Endocrinology

## 2021-03-18 VITALS — BP 112/76 | HR 92 | Ht 71.0 in | Wt 246.8 lb

## 2021-03-18 DIAGNOSIS — I10 Essential (primary) hypertension: Secondary | ICD-10-CM | POA: Diagnosis not present

## 2021-03-18 DIAGNOSIS — Z794 Long term (current) use of insulin: Secondary | ICD-10-CM | POA: Diagnosis not present

## 2021-03-18 DIAGNOSIS — E782 Mixed hyperlipidemia: Secondary | ICD-10-CM | POA: Diagnosis not present

## 2021-03-18 DIAGNOSIS — E119 Type 2 diabetes mellitus without complications: Secondary | ICD-10-CM | POA: Diagnosis not present
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
## 2021-03-18 NOTE — Patient Instructions (Signed)
Advice for Weight Management  -For most of [place] the best way to lose weight is by diet management. Generally speaking, diet management means consuming less calories intentionally which over time brings about progressive weight loss.  This can be achieved more effectively by restricting carbohydrate consumption to the minimum possible.  So, it is critically important to know your numbers: how much calorie you are consuming and how much calorie you need. More importantly, our carbohydrates sources should be unprocessed or minimally processed complex starch food items.   Sometimes, it is important to balance nutrition by increasing protein intake (animal or plant source), fruits, and vegetables.  - Whole Food, Plant Predominant Nutrition is highly recommended: Eat Plenty of vegetables, Mushrooms, fruits, Legumes, Whole Grains, Nuts, seeds in lieu of processed meats, processed snacks/pastries red meat, poultry, eggs.  -Sticking to a routine mealtime to eat 3 meals a day and avoiding unnecessary snacks is shown to have a big role in weight control. Under normal circumstances, the only time we lose real weight is when we are hungry, so allow hunger to take place- hunger means no food between meal times, only water.  It is not advisable to starve.   -It is better to avoid simple carbohydrates including: Cakes, Sweet Desserts, Ice Cream, Soda (diet and regular), Sweet Tea, Candies, Chips, Cookies, Store Bought Juices, Alcohol in Excess of  1-2 drinks a day, Lemonade,  Artificial Sweeteners, Doughnuts, Coffee Creamers, "Sugar-free" Products, etc, etc.  This is not a complete list...[name].    -Consulting with certified diabetes educators is proven to provide you with the most accurate and current information on diet.  Also, you may be  interested in discussing diet options/exchanges , we can schedule a visit with Jearld Fenton, RDN, CDE for  individualized nutrition education.  -Exercise: If you are able: 30 -60 minutes a day ,4 days a week, or 150 minutes a week.  The longer the better.  Combine stretch, strength, and aerobic activities.  If you were told in the past that you have high risk for cardiovascular diseases, you may seek evaluation by your heart doctor prior to initiating moderate to intense exercise programs.                                  Additional Care Considerations for Diabetes   -Diabetes  is a chronic disease.  The most important care consideration is regular follow-up with your diabetes care provider with the goal being avoiding or delaying its complications and to take advantage of advances in medications and technology.    - Whole Food, Plant Predominant Nutrition is highly recommended: Eat Plenty of vegetables, Mushrooms, fruits, Legumes, Whole Grains, Nuts, seeds in lieu of processed meats, processed snacks/pastries red meat, poultry, eggs.  -Type 2 diabetes is known to coexist with other important comorbidities such as high blood pressure and high cholesterol.  It is critical to control not only the diabetes but also the high blood pressure and high cholesterol to minimize and delay the risk of complications including coronary artery disease, stroke, amputations, blindness, etc.    - Studies showed that people with diabetes will benefit from a class of medications known as ACE inhibitors and statins.  Unless  there are specific reasons not to be on these medications, the standard of care is to consider getting one from these groups of medications at an optimal doses.  These medications are generally considered safe and proven to help protect the heart and the kidneys.    - People with diabetes are encouraged to initiate and maintain regular follow-up with eye doctors, foot doctors, dentists , and if necessary heart and kidney doctors.     - It is highly recommended that people with diabetes quit smoking or  stay away from smoking, and get yearly  flu vaccine and pneumonia vaccine at least every 5 years.  One other important lifestyle recommendation is to ensure adequate sleep - at least 6-7 hours of uninterrupted sleep at night.  -Exercise: If you are able: 30 -60 minutes a day, 4 days a week, or 150 minutes a week.  The longer the better.  Combine stretch, strength, and aerobic activities.  If you were told in the past that you have high risk for cardiovascular diseases, you may seek evaluation by your heart doctor prior to initiating moderate to intense exercise programs.

## 2021-03-20 ENCOUNTER — Telehealth: Payer: Self-pay | Admitting: Internal Medicine

## 2021-03-20 MED ORDER — METOPROLOL SUCCINATE ER 25 MG PO TB24: 25.0000 mg | ORAL_TABLET | Freq: Every day | ORAL | 1 refills | Status: DC

## 2021-03-20 NOTE — Telephone Encounter (Signed)
  *  STAT* If patient is at the pharmacy, call can be transferred to refill team.   1. Which medications need to be refilled? (please list name of each medication and dose if known) metoprolol succinate (TOPROL XL) 25 MG 24 hr tablet  2. Which pharmacy/location (including street and city if local pharmacy) is medication to be sent to? [place], [place] [address]  3. Do they need a 30 day or 90 day supply? 90 days

## 2021-03-20 NOTE — Telephone Encounter (Signed)
Complete

## 2021-03-20 NOTE — Telephone Encounter (Signed)
Sent a message to Willeen Cass, RN who stated that she will call the pt 11/11 to schedule Ablation.

## 2021-03-20 NOTE — Telephone Encounter (Signed)
   Pt would like to schedule his ablation

## 2021-03-21 ENCOUNTER — Telehealth: Payer: Self-pay

## 2021-03-21 DIAGNOSIS — Z01812 Encounter for preprocedural laboratory examination: Secondary | ICD-10-CM

## 2021-03-21 DIAGNOSIS — I471 Supraventricular tachycardia: Secondary | ICD-10-CM

## 2021-03-21 NOTE — Telephone Encounter (Signed)
Attempted to call Pt.  Went to VM.  Unable to leave message.  Sent mychart message with available dates.

## 2021-03-31 ENCOUNTER — Ambulatory Visit: Payer: Self-pay

## 2021-04-01 NOTE — Telephone Encounter (Signed)
Pt agreeable to 04/16/2021 for SVT ablation. Aware instructions will be sent via mychart. Pt will let us know a day that is convenient for his to stop by the office for pre procedure blood work, via [employer].

## 2021-04-07 ENCOUNTER — Telehealth: Payer: Self-pay | Admitting: Pharmacist

## 2021-04-07 ENCOUNTER — Telehealth: Payer: Self-pay | Admitting: Nurse Practitioner

## 2021-04-07 NOTE — Patient Outreach (Signed)
04/07/2021 Name: Raymond Malone MRN: [id number] DOB: 05-Jul-1975  Referred by: Noreene Larsson, NP Reason for referral : No chief complaint on file.   Patient not interested in Managed Medicaid services at this time.   Follow Up Plan: PCP and/or patient can reach out if decide to pursue Managed Medicaid services in the future  Hughes Better PharmD, CPP High Risk Managed Medicaid [place] [phone]

## 2021-04-07 NOTE — Telephone Encounter (Signed)
..   Medicaid Managed Care   Unsuccessful Outreach Note  04/07/2021 Name: LATRON RIBAS MRN: [id number] DOB: 03-Apr-1976  Referred by: Noreene Larsson, NP Reason for referral : High Risk Managed Medicaid (I called the patient today to get him rescheduled with the [facility] Pharmacist. His VM was full.)   Third unsuccessful telephone outreach was attempted today. The patient was referred to the case management team for assistance with care management and care coordination. The patient's primary care provider has been notified of our unsuccessful attempts to make or maintain contact with the patient. The care management team is pleased to engage with this patient at any time in the future should he/she be interested in assistance from the care management team.   Follow Up Plan: We have been unable to make contact with the patient for follow up. The care management team is available to follow up with the patient after provider conversation with the patient regarding recommendation for care management engagement and subsequent re-referral to the care management team.   [place], [place]

## 2021-04-08 ENCOUNTER — Telehealth: Payer: Self-pay | Admitting: *Deleted

## 2021-04-08 DIAGNOSIS — Z01812 Encounter for preprocedural laboratory examination: Secondary | ICD-10-CM

## 2021-04-08 NOTE — Telephone Encounter (Signed)
Called patient to notify the need to have labs done prior to ablation on 04/16/21. No answer, voicemail is full. Will try again later.

## 2021-04-09 ENCOUNTER — Encounter: Payer: Self-pay | Admitting: *Deleted

## 2021-04-10 ENCOUNTER — Other Ambulatory Visit: Payer: Self-pay

## 2021-04-10 ENCOUNTER — Encounter: Payer: Medicaid Other | Attending: "Endocrinology | Admitting: Nutrition

## 2021-04-10 DIAGNOSIS — E669 Obesity, unspecified: Secondary | ICD-10-CM | POA: Diagnosis present

## 2021-04-10 DIAGNOSIS — I10 Essential (primary) hypertension: Secondary | ICD-10-CM | POA: Insufficient documentation

## 2021-04-10 DIAGNOSIS — E782 Mixed hyperlipidemia: Secondary | ICD-10-CM | POA: Insufficient documentation

## 2021-04-10 DIAGNOSIS — E119 Type 2 diabetes mellitus without complications: Secondary | ICD-10-CM | POA: Insufficient documentation

## 2021-04-10 DIAGNOSIS — F17200 Nicotine dependence, unspecified, uncomplicated: Secondary | ICD-10-CM | POA: Insufficient documentation

## 2021-04-10 NOTE — Progress Notes (Signed)
Medical Nutrition Therapy  Appointment Start time:  0923  Appointment End time:  16  Primary concerns today: Dm Type 2, Obesity  Referral diagnosis: E11.8, E66.9 Preferred learning style: no preference (auditory, visual, hands on, no preference indicated) Learning readiness: change in progress    NUTRITION ASSESSMENT  Sees Dr. Dorris Fetch, Endocrinology. 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 FBS: 87-150's  Bedtime: 150's Cut out a lot of breads, snacks, desserts and  Drinking water,  Anthropometrics  Wt Readings from Last 3 Encounters:  03/18/21 246 lb 12.8 oz (111.9 kg)  03/05/21 249 lb 3.2 oz (113 kg)  02/18/21 250 lb (113.4 kg)   Ht Readings from Last 3 Encounters:  03/18/21 5' 11"  (1.803 m)  03/05/21 5' 11"  (1.803 m)  02/18/21 5' 11"  (1.803 m)   There is no height or weight on file to calculate BMI. @BMIFA @ Facility age limit for growth percentiles is 20 years. Facility age limit for growth percentiles is 20 years.    Clinical Medical Hx: HTN, OBesity, and DM Type 2 Medications: Latnus 30 units, Novolog 1 unit TID Metformin 1000 mg BID Labs:  Lab Results  Component Value Date   HGBA1C 11.7 (A) 03/05/2021   CMP Latest Ref Rng & Units 07/19/2020 02/09/2020 11/28/2018  Glucose 70 - 99 mg/dL 329(H) 225(H) 294(H)  BUN 6 - 20 mg/dL 10 14 13   Creatinine 0.61 - 1.24 mg/dL 0.90 0.94 0.99  Sodium 135 - 145 mmol/L 131(L) 141 129(L)  Potassium 3.5 - 5.1 mmol/L 3.9 4.6 3.7  Chloride 98 - 111 mmol/L 97(L) 104 96(L)  CO2 22 - 32 mmol/L 24 25 22   Calcium 8.9 - 10.3 mg/dL 9.1 9.6 9.3  Total Protein 6.0 - 8.5 g/dL - 6.5 -  Total Bilirubin 0.0 - 1.2 mg/dL - 0.5 -  Alkaline Phos 44 - 121 IU/L - 95 -  AST 0 - 40 IU/L - 20 -  ALT 0 - 44 IU/L - 30 -   Lipid Panel     Component Value Date/Time   CHOL 163 02/08/2017 0852   TRIG 81 02/08/2017 0852   HDL 38 (L) 02/08/2017 0852   CHOLHDL 4.3 02/08/2017 0852   LDLCALC 108 (H)  02/08/2017 0852    Notable Signs/Symptoms: NOne  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  Estimated daily fluid intake: 40 oz Supplements:  Sleep: varies Stress / self-care: diabetes Current average weekly physical activity: not mouch  24-Hr Dietary Recall Eats 2-3 meals per day. Eats fast food. On the road a lot driving.  Estimated Energy Needs Calories: 1800 Carbohydrate: 200g Protein: 135g Fat: 50g   NUTRITION DIAGNOSIS  NB-1.1 Food and nutrition-related knowledge deficit As related to Diabetes Type 2, Obesity.  As evidenced by A1VC 11.3% and BMI > 30.   NUTRITION INTERVENTION  Nutrition education (E-1) on the following topics:  Nutrition and Diabetes education provided on My Plate, CHO counting, meal planning, portion sizes, timing of meals, avoiding snacks between meals unless having a low blood sugar, target ranges for A1C and blood sugars, signs/symptoms and treatment of hyper/hypoglycemia, monitoring blood sugars, taking medications as prescribed, benefits of exercising 30 minutes per day and prevention of complications of DM. Lifestyle Medicine - Whole Food, Plant Predominant Nutrition is highly recommended: Eat Plenty of vegetables, Mushrooms, fruits, Legumes, Whole Grains, Nuts, seeds in lieu of processed meats, processed snacks/pastries red meat, poultry, eggs.    -  It is better to avoid simple carbohydrates including: Cakes, Sweet Desserts, Ice Cream, Soda (diet and regular), Sweet Tea, Candies, Chips, Cookies, Store Bought Juices, Alcohol in Excess of  1-2 drinks a day, Lemonade,  Artificial Sweeteners, Doughnuts, Coffee Creamers, "Sugar-free" Products, etc, etc.  This is not a complete list.....  Exercise: If you are able: 30 -60 minutes a day ,4 days a week, or 150 minutes a week.  The longer the better.  Combine stretch, strength, and aerobic activities.  If you were told in the past that you have high risk for cardiovascular diseases, you may seek  evaluation by your heart doctor prior to initiating moderate to intense exercise programs.   Handouts Provided Include  Nutrition Lifestyle Meal Plan Card Plant based lifestyle  Learning Style & Readiness for Change Teaching method utilized: Visual & Auditory  Demonstrated degree of understanding via: Teach Back  Barriers to learning/adherence to lifestyle change: none  Goals Established by Pt Eat three meals a day at times discussed. Pack meals and food to have in truck. Drink only water  Cut out snacks unless veggies. Test blood sugars 4 times per day Take Medications as prescribed. Work on [employer]. Get A1C down to 7% Exercise 150 minutes a week.    MONITORING & EVALUATION Dietary intake, weekly physical activity, and blood sugars in 1 month.  Next Steps  Patient is to work on meal planning, food prep and exercise.[name]

## 2021-04-15 ENCOUNTER — Other Ambulatory Visit (HOSPITAL_COMMUNITY)
Admission: RE | Admit: 2021-04-15 | Discharge: 2021-04-15 | Disposition: A | Discharge status: Home / Self Care | Payer: Medicaid Other | Source: Ambulatory Visit | Attending: Internal Medicine | Admitting: Internal Medicine

## 2021-04-15 DIAGNOSIS — Z01812 Encounter for preprocedural laboratory examination: Secondary | ICD-10-CM | POA: Diagnosis not present

## 2021-04-15 LAB — BASIC METABOLIC PANEL
Anion gap: 7 (ref 5–15)
BUN: 14 mg/dL (ref 6–20)
CO2: 26 mmol/L (ref 22–32)
Calcium: 9.2 mg/dL (ref 8.9–10.3)
Chloride: 101 mmol/L (ref 98–111)
Creatinine, Ser: 0.8 mg/dL (ref 0.61–1.24)
GFR, Estimated: >60 mL/min (ref >60)
Glucose, Bld: 164 mg/dL — ABNORMAL HIGH (ref 70–99)
Potassium: 3.7 mmol/L (ref 3.5–5.1)
Sodium: 134 mmol/L — ABNORMAL LOW (ref 135–145)

## 2021-04-15 LAB — CBC
HCT: 44.9 % (ref 39.0–52.0)
Hemoglobin: 15.6 g/dL (ref 13.0–17.0)
MCH: 30.5 pg (ref 26.0–34.0)
MCHC: 34.7 g/dL (ref 30.0–36.0)
MCV: 87.7 fL (ref 80.0–100.0)
Platelets: 340 10*3/uL (ref 150–400)
RBC: 5.12 MIL/uL (ref 4.22–5.81)
RDW: 12.1 % (ref 11.5–15.5)
WBC: 7.2 10*3/uL (ref 4.0–10.5)
nRBC: 0 % (ref 0.0–0.2)

## 2021-04-15 NOTE — Pre-Procedure Instructions (Signed)
Instructed patient on the following items: Arrival time 1030 Nothing to eat or drink after midnight No meds AM of procedure Responsible person to drive you home and stay with you for 24 hrs  Take 1/2 dose insulin tonight

## 2021-04-16 ENCOUNTER — Encounter (HOSPITAL_COMMUNITY)
Admission: RE | Disposition: A | Discharge status: Home / Self Care | Payer: Self-pay | Source: Home / Self Care | Attending: Internal Medicine

## 2021-04-16 ENCOUNTER — Other Ambulatory Visit: Payer: Self-pay

## 2021-04-16 ENCOUNTER — Ambulatory Visit (HOSPITAL_COMMUNITY)
Admission: RE | Admit: 2021-04-16 | Discharge: 2021-04-17 | Disposition: A | Discharge status: Home / Self Care | Payer: Medicaid Other | Attending: Internal Medicine | Admitting: Internal Medicine

## 2021-04-16 DIAGNOSIS — E119 Type 2 diabetes mellitus without complications: Secondary | ICD-10-CM | POA: Diagnosis not present

## 2021-04-16 DIAGNOSIS — I10 Essential (primary) hypertension: Secondary | ICD-10-CM | POA: Insufficient documentation

## 2021-04-16 DIAGNOSIS — I471 Supraventricular tachycardia: Secondary | ICD-10-CM | POA: Insufficient documentation

## 2021-04-16 DIAGNOSIS — E785 Hyperlipidemia, unspecified: Secondary | ICD-10-CM | POA: Insufficient documentation

## 2021-04-16 HISTORY — PX: SVT ABLATION: EP1225

## 2021-04-16 LAB — GLUCOSE, CAPILLARY
Glucose-Capillary: 154 mg/dL — ABNORMAL HIGH (ref 70–99)
Glucose-Capillary: 119 mg/dL — ABNORMAL HIGH (ref 70–99)

## 2021-04-16 SURGERY — SVT ABLATION

## 2021-04-16 MED ORDER — BUPIVACAINE HCL (PF) 0.25 % IJ SOLN
INTRAMUSCULAR | Status: DC | PRN
  Administered 2021-04-16: 60 mL

## 2021-04-16 MED ORDER — ACETAMINOPHEN 325 MG PO TABS
650.0000 mg | ORAL_TABLET | ORAL | Status: DC | PRN
  Administered 2021-04-17: 650 mg via ORAL
  Filled 2021-04-16: qty 2

## 2021-04-16 MED ORDER — BUPIVACAINE HCL (PF) 0.25 % IJ SOLN
INTRAMUSCULAR | Status: AC
  Filled 2021-04-16: qty 30

## 2021-04-16 MED ORDER — SODIUM CHLORIDE 0.9% FLUSH: 3.0000 mL | Freq: Two times a day (BID) | INTRAVENOUS | Status: DC

## 2021-04-16 MED ORDER — ISOPROTERENOL HCL 0.2 MG/ML IJ SOLN
INTRAMUSCULAR | Status: AC
  Filled 2021-04-16: qty 5

## 2021-04-16 MED ORDER — MIDAZOLAM HCL 5 MG/5ML IJ SOLN
INTRAMUSCULAR | Status: AC
  Filled 2021-04-16: qty 5

## 2021-04-16 MED ORDER — INSULIN GLARGINE-YFGN 100 UNIT/ML ~~LOC~~ SOPN: 30.0000 [IU] | PEN_INJECTOR | Freq: Every day | SUBCUTANEOUS | Status: DC

## 2021-04-16 MED ORDER — FENTANYL CITRATE (PF) 100 MCG/2ML IJ SOLN
INTRAMUSCULAR | Status: AC
  Filled 2021-04-16: qty 2

## 2021-04-16 MED ORDER — SODIUM CHLORIDE 0.9% FLUSH
3.0000 mL | Freq: Two times a day (BID) | INTRAVENOUS | Status: DC
  Administered 2021-04-17: 3 mL via INTRAVENOUS

## 2021-04-16 MED ORDER — INSULIN GLARGINE 100 UNIT/ML SOLOSTAR PEN: 30.0000 [IU] | PEN_INJECTOR | Freq: Every day | SUBCUTANEOUS | Status: DC

## 2021-04-16 MED ORDER — LISINOPRIL 20 MG PO TABS
20.0000 mg | ORAL_TABLET | Freq: Every day | ORAL | Status: DC
  Administered 2021-04-16 – 2021-04-17 (×2): 20 mg via ORAL
  Filled 2021-04-16 (×2): qty 1

## 2021-04-16 MED ORDER — LISINOPRIL-HYDROCHLOROTHIAZIDE 20-25 MG PO TABS: 1.0000 | ORAL_TABLET | Freq: Every day | ORAL | Status: DC

## 2021-04-16 MED ORDER — PEN NEEDLES 31G X 5 MM MISC: 1.0000 | Freq: Three times a day (TID) | Status: DC

## 2021-04-16 MED ORDER — PANTOPRAZOLE SODIUM 20 MG PO TBEC
20.0000 mg | DELAYED_RELEASE_TABLET | Freq: Every day | ORAL | Status: DC
  Administered 2021-04-16 – 2021-04-17 (×2): 20 mg via ORAL
  Filled 2021-04-16 (×2): qty 1

## 2021-04-16 MED ORDER — ONDANSETRON HCL 4 MG/2ML IJ SOLN: 4.0000 mg | Freq: Four times a day (QID) | INTRAMUSCULAR | Status: DC | PRN

## 2021-04-16 MED ORDER — FENTANYL CITRATE (PF) 100 MCG/2ML IJ SOLN
INTRAMUSCULAR | Status: DC | PRN
  Administered 2021-04-16 (×4): 12.5 ug via INTRAVENOUS
  Administered 2021-04-16: 25 ug via INTRAVENOUS
  Administered 2021-04-16: 12.5 ug via INTRAVENOUS

## 2021-04-16 MED ORDER — HYDROCHLOROTHIAZIDE 25 MG PO TABS
25.0000 mg | ORAL_TABLET | Freq: Every day | ORAL | Status: DC
  Administered 2021-04-16 – 2021-04-17 (×2): 25 mg via ORAL
  Filled 2021-04-16 (×2): qty 1

## 2021-04-16 MED ORDER — HEPARIN (PORCINE) IN NACL 2000-0.9 UNIT/L-% IV SOLN
INTRAVENOUS | Status: DC | PRN
  Administered 2021-04-16: 1000 mL

## 2021-04-16 MED ORDER — SODIUM CHLORIDE 0.9 % IV SOLN: INTRAVENOUS | Status: DC

## 2021-04-16 MED ORDER — SODIUM CHLORIDE 0.9 % IV SOLN: 250.0000 mL | INTRAVENOUS | Status: DC | PRN

## 2021-04-16 MED ORDER — GLIPIZIDE 10 MG PO TABS
10.0000 mg | ORAL_TABLET | Freq: Every day | ORAL | Status: DC
  Administered 2021-04-17: 10 mg via ORAL
  Filled 2021-04-16: qty 1

## 2021-04-16 MED ORDER — SODIUM CHLORIDE 0.9% FLUSH: 3.0000 mL | INTRAVENOUS | Status: DC | PRN

## 2021-04-16 MED ORDER — INSULIN ASPART 100 UNIT/ML IJ SOLN
0.0000 [IU] | Freq: Three times a day (TID) | INTRAMUSCULAR | Status: DC
  Administered 2021-04-17: 8 [IU] via SUBCUTANEOUS

## 2021-04-16 MED ORDER — METFORMIN HCL 500 MG PO TABS
1000.0000 mg | ORAL_TABLET | Freq: Two times a day (BID) | ORAL | Status: DC
  Administered 2021-04-16 – 2021-04-17 (×2): 1000 mg via ORAL
  Filled 2021-04-16 (×2): qty 2

## 2021-04-16 MED ORDER — METOPROLOL TARTRATE 5 MG/5ML IV SOLN
INTRAVENOUS | Status: AC
  Filled 2021-04-16: qty 5

## 2021-04-16 MED ORDER — ACETAMINOPHEN 325 MG PO TABS
650.0000 mg | ORAL_TABLET | ORAL | Status: DC | PRN
  Filled 2021-04-16: qty 2

## 2021-04-16 MED ORDER — HEPARIN (PORCINE) IN NACL 2000-0.9 UNIT/L-% IV SOLN
INTRAVENOUS | Status: AC
  Filled 2021-04-16: qty 1000

## 2021-04-16 MED ORDER — ACCU-CHEK GUIDE ME W/DEVICE KIT: 1.0000 | PACK | Status: DC

## 2021-04-16 MED ORDER — MIDAZOLAM HCL 5 MG/5ML IJ SOLN
INTRAMUSCULAR | Status: DC | PRN
  Administered 2021-04-16: 1 mg via INTRAVENOUS
  Administered 2021-04-16: 2 mg via INTRAVENOUS
  Administered 2021-04-16 (×4): 1 mg via INTRAVENOUS

## 2021-04-16 SURGICAL SUPPLY — 12 items
BAG SNAP BAND KOVER 36X36 (MISCELLANEOUS) ×1 IMPLANT
CATH EZ STEER NAV 4MM D-F CUR (ABLATOR) ×1 IMPLANT
CATH HEX JOS 2-5-2 65CM 6F REP (CATHETERS) ×1 IMPLANT
CATH JOSEPH QUAD ALLRED 6F REP (CATHETERS) ×2 IMPLANT
MAT PREVALON FULL STRYKER (MISCELLANEOUS) ×1 IMPLANT
PACK EP LATEX FREE (CUSTOM PROCEDURE TRAY) ×2
PACK EP LF (CUSTOM PROCEDURE TRAY) ×1 IMPLANT
PAD DEFIB RADIO PHYSIO CONN (PAD) ×2 IMPLANT
PATCH CARTO3 (PAD) ×1 IMPLANT
SHEATH PINNACLE 6F 10CM (SHEATH) ×2 IMPLANT
SHEATH PINNACLE 7F 10CM (SHEATH) ×1 IMPLANT
SHEATH PINNACLE 8F 10CM (SHEATH) ×1 IMPLANT

## 2021-04-16 NOTE — Discharge Instructions (Signed)
Post procedure care instructions No driving for 4 days. No lifting over 5 lbs for 1 week. No vigorous or sexual activity for 1 week. You may return to work/your usual activities on 03/25/21. Keep procedure site clean & dry. If you notice increased pain, swelling, bleeding or pus, call/return!  You may shower after 24 hours, but no soaking in baths/hot tubs/pools for 1 week.

## 2021-04-16 NOTE — Progress Notes (Signed)
Site area: rt groin fv sheaths x3 Site Prior to Removal:  Level 0 Pressure Applied For: 15 minutes Manual:   yes Patient Status During Pull:  stable Post Pull Site:  Level 0 Post Pull Instructions Given:  yes Post Pull Pulses Present: rt dp palpable Dressing Applied:  gauze and tegaderm Bedrest begins @ 3762 Comments:

## 2021-04-16 NOTE — Discharge Summary (Addendum)
ELECTROPHYSIOLOGY PROCEDURE DISCHARGE SUMMARY    Patient ID: Raymond Malone,  MRN: [id number], DOB/AGE: 44-Dec-1977 43 y.o.  Admit date: 04/16/2021 Discharge date: 04/17/21  Primary Care Physician: Noreene Larsson, NP  Primary Cardiologist: Dr. Domenic Polite Electrophysiologist: Dr. Lovena Le  Primary Discharge Diagnosis:  SVT   Secondary Discharge Diagnosis:  HTN DM HLD  No Known Allergies   Procedures This Admission: 1.  Electrophysiology study and radiofrequency catheter ablation on 04/16/21 by Dr Lovena Le.    CONCLUSIONS:  1. Sinus rhythm upon presentation.  2. 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 Successful radiofrequency modification of the slow AV nodal pathway  4. No inducible arrhythmias following ablation.  5. No early apparent complications.   Brief HPI: Raymond Malone is a 45 y.o. male with a past medical history as outlined above.  She has had increasing tachypalpitations with documented SVT.   Risks, benefits, and alternatives to ablation were reviewed with the patient who wished to proceed.   Hospital Course:  The patient was admitted and underwent EPS/RFCA with details as outlined above. He was monitored on telemetry overnight which demonstrated SR.  R groin and neck incisions were without complication.  They were examined and  Considered stable for discharge to home.  Follow up is in place for 4 weeks.  Wound care and restrictions were reviewed with the patient prior to discharge.   Physical Exam: Vitals:   04/16/21 1616 04/16/21 2005 04/17/21 0558 04/17/21 0824  BP: 119/85 128/85 123/76 132/85  Pulse: 88 83 84   Resp: _0 Temp: 97.9 F (36.6 C) 98.8 F (37.1 C) 98.9 F (37.2 C)   TempSrc: Oral Oral Oral   SpO2: 96% 96% 96%   Weight:      Height:        GEN- The patient is well appearing, alert and  oriented x 3 today.   HEENT: normocephalic, atraumatic; sclera clear, conjunctiva pink; hearing intact; oropharynx clear; neck supple, no JVP Lymph- no cervical lymphadenopathy Lungs- CTA b/l, normal work of breathing.  No wheezes, rales, rhonchi Heart- RRR, no murmurs, rubs or gallops, PMI not laterally displaced GI- soft, non-tender, non-distended, bowel sounds present, no hepatosplenomegaly Extremities- no clubbing, cyanosis, or edema; DP/PT/radial pulses 2+ bilaterally,  groin without hematoma/bruit MS- no significant deformity or atrophy Skin- warm and dry, no rash or lesion Psych- euthymic mood, full affect Neuro- strength and sensation are intact   Discharge Vitals: BP 132/85   Pulse 84   Temp 98.9 F (37.2 C) (Oral)   Resp 18   Ht 5' 11" (1.803 m)   Wt 108.9 kg   SpO2 96%   BMI 33.47 kg/m      Labs:   Lab Results  Component Value Date   WBC 7.2 04/15/2021   HGB 15.6 04/15/2021   HCT 44.9 04/15/2021   MCV 87.7 04/15/2021   PLT 340 04/15/2021    Recent Labs  Lab 04/15/21 1106  NA 134*  K 3.7  CL 101  CO2 26  BUN 14  CREATININE 0.80  CALCIUM 9.2  GLUCOSE 164*    Discharge Medications:  Allergies as of 04/17/2021   No Known Allergies      Medication List     TAKE these medications    Accu-Chek Guide Me w/Device Kit 1 Piece by Does not apply route as directed.  Accu-Chek Guide test strip Generic drug: glucose blood Use to test glucose 4 times a day.   blood glucose meter kit and supplies Dispense based on patient and insurance preference. Use up to four times daily as directed. (FOR ICD-10 E10.9, E11.9).   glipiZIDE 10 MG tablet Commonly known as: GLUCOTROL Take 10 mg by mouth daily before breakfast.   insulin aspart 100 UNIT/ML FlexPen Commonly known as: NOVOLOG Inject 1 Units into the skin 3 (three) times daily with meals. Notes to patient: This is listed as "no longer taking", please confirm with your prescribing physician  instructions   Lantus SoloStar 100 UNIT/ML Solostar Pen Generic drug: insulin glargine Inject 30 Units into the skin at bedtime. Notes to patient: This is listed as "no longer taking", please confirm with prescribing physician instructions   lisinopril-hydrochlorothiazide 20-25 MG tablet Commonly known as: ZESTORETIC Take 1 tablet by mouth once daily   metFORMIN 1000 MG tablet Commonly known as: GLUCOPHAGE TAKE 1 TABLET BY MOUTH TWICE DAILY WITH MEALS   metoprolol succinate 25 MG 24 hr tablet Commonly known as: Toprol XL Take 1 tablet (25 mg total) by mouth daily. What changed: when to take this   pantoprazole 20 MG tablet Commonly known as: Protonix Take 1 tablet (20 mg total) by mouth daily.   Pen Needles 31G X 5 MM Misc 1 each by Does not apply route in the morning, at noon, in the evening, and at bedtime.   Semglee (yfgn) 100 UNIT/ML Pen Generic drug: insulin glargine-yfgn Inject 30 Units into the skin at bedtime.   simvastatin 40 MG tablet Commonly known as: ZOCOR Take 1 tablet by mouth in the evening        Disposition: Home Discharge Instructions     Diet - low sodium heart healthy   Complete by: As directed    Increase activity slowly   Complete by: As directed         Duration of Discharge Encounter: Greater than 30 minutes including physician time.  Venetia Night, PA 04/17/2021 8:30 AM   EP Attending  Patient seen and examined. Agree with the findings as noted above. The patient is doing well after EPS/RFA of AVNRT. He can be discharged home with the usual followup.  Carleene Overlie Raymond Stannard,MD
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
## 2021-04-16 NOTE — Progress Notes (Signed)
Site area: rt IJ venous sheath Site Prior to Removal:  Level 0 Pressure Applied For: 10 minutes Manual:   yes Patient Status During Pull:  stable Post Pull Site:  Level 0 Post Pull Instructions Given:  yes Post Pull Pulses Present: NA Dressing Applied:  petroleum gauze, gauze and tegaderm Bedrest begins @  Comments:

## 2021-04-17 ENCOUNTER — Encounter (HOSPITAL_COMMUNITY): Payer: Self-pay | Admitting: Internal Medicine

## 2021-04-17 DIAGNOSIS — I10 Essential (primary) hypertension: Secondary | ICD-10-CM | POA: Diagnosis not present

## 2021-04-17 DIAGNOSIS — I471 Supraventricular tachycardia: Secondary | ICD-10-CM | POA: Diagnosis not present

## 2021-04-17 DIAGNOSIS — E785 Hyperlipidemia, unspecified: Secondary | ICD-10-CM | POA: Diagnosis not present

## 2021-04-17 DIAGNOSIS — E119 Type 2 diabetes mellitus without complications: Secondary | ICD-10-CM | POA: Diagnosis not present

## 2021-04-17 LAB — HEMOGLOBIN A1C
Hgb A1c MFr Bld: 8.9 % — ABNORMAL HIGH (ref 4.8–5.6)
Mean Plasma Glucose: 208.73 mg/dL

## 2021-04-17 LAB — GLUCOSE, CAPILLARY: Glucose-Capillary: 253 mg/dL — ABNORMAL HIGH (ref 70–99)

## 2021-04-17 NOTE — Progress Notes (Signed)
Discharge instructions (including medications) discussed with and copy provided to patient/caregiver 

## 2021-04-17 NOTE — Plan of Care (Signed)
  Problem: Education: Goal: Knowledge of General Education information will improve Description: Including pain rating scale, medication(s)/side effects and non-pharmacologic comfort measures Outcome: Adequate for Discharge   

## 2021-05-01 ENCOUNTER — Encounter: Payer: Self-pay | Admitting: Nutrition

## 2021-05-01 NOTE — Patient Instructions (Signed)
°  Goals Established by Pt Eat three meals a day at times discussed. Pack meals and food to have in truck. Drink only water  Cut out snacks unless veggies. Test blood sugars 4 times per day Take Medications as prescribed. Work on [employer]. Get A1C down to 7% Exercise 150 minutes a week.

## 2021-05-13 ENCOUNTER — Ambulatory Visit: Payer: Medicaid Other | Admitting: Nurse Practitioner

## 2021-05-27 ENCOUNTER — Ambulatory Visit: Payer: Medicaid Other | Admitting: Internal Medicine

## 2021-06-06 ENCOUNTER — Other Ambulatory Visit: Payer: Self-pay | Admitting: Family Medicine

## 2021-06-06 DIAGNOSIS — E119 Type 2 diabetes mellitus without complications: Secondary | ICD-10-CM

## 2021-06-06 DIAGNOSIS — I10 Essential (primary) hypertension: Secondary | ICD-10-CM

## 2021-06-10 DIAGNOSIS — Z794 Long term (current) use of insulin: Secondary | ICD-10-CM | POA: Diagnosis not present

## 2021-06-10 DIAGNOSIS — E119 Type 2 diabetes mellitus without complications: Secondary | ICD-10-CM | POA: Diagnosis not present

## 2021-06-11 LAB — COMPREHENSIVE METABOLIC PANEL
ALT: 29 IU/L (ref 0–44)
AST: 22 IU/L (ref 0–40)
Albumin/Globulin Ratio: 2 (ref 1.2–2.2)
Albumin: 4.7 g/dL (ref 4.0–5.0)
Alkaline Phosphatase: 96 IU/L (ref 44–121)
BUN/Creatinine Ratio: 16 (ref 9–20)
BUN: 16 mg/dL (ref 6–24)
Bilirubin Total: 0.7 mg/dL (ref 0.0–1.2)
CO2: 20 mmol/L (ref 20–29)
Calcium: 10.2 mg/dL (ref 8.7–10.2)
Chloride: 98 mmol/L (ref 96–106)
Creatinine, Ser: 0.98 mg/dL (ref 0.76–1.27)
Globulin, Total: 2.3 g/dL (ref 1.5–4.5)
Glucose: 189 mg/dL — ABNORMAL HIGH (ref 70–99)
Potassium: 4.2 mmol/L (ref 3.5–5.2)
Sodium: 136 mmol/L (ref 134–144)
Total Protein: 7 g/dL (ref 6.0–8.5)
eGFR: 98 mL/min/{1.73_m2} (ref >59)

## 2021-06-11 LAB — LIPID PANEL
Chol/HDL Ratio: 4.6 ratio (ref 0.0–5.0)
Cholesterol, Total: 151 mg/dL (ref 100–199)
HDL: 33 mg/dL — ABNORMAL LOW (ref >39)
LDL Chol Calc (NIH): 92 mg/dL (ref 0–99)
Triglycerides: 146 mg/dL (ref 0–149)
VLDL Cholesterol Cal: 26 mg/dL (ref 5–40)

## 2021-06-11 LAB — TSH: TSH: 1.03 u[IU]/mL (ref 0.450–4.500)

## 2021-06-11 LAB — T4, FREE: Free T4: 1.5 ng/dL (ref 0.82–1.77)

## 2021-06-18 ENCOUNTER — Other Ambulatory Visit: Payer: Self-pay

## 2021-06-18 ENCOUNTER — Ambulatory Visit (INDEPENDENT_AMBULATORY_CARE_PROVIDER_SITE_OTHER): Payer: Medicaid Other | Admitting: "Endocrinology

## 2021-06-18 ENCOUNTER — Ambulatory Visit: Payer: Medicaid Other | Admitting: Nutrition

## 2021-06-18 ENCOUNTER — Encounter: Payer: Self-pay | Admitting: "Endocrinology

## 2021-06-18 VITALS — BP 118/70 | HR 88 | Ht 71.0 in | Wt 239.2 lb

## 2021-06-18 DIAGNOSIS — Z6834 Body mass index (BMI) 34.0-34.9, adult: Secondary | ICD-10-CM

## 2021-06-18 DIAGNOSIS — Z794 Long term (current) use of insulin: Secondary | ICD-10-CM | POA: Diagnosis not present

## 2021-06-18 DIAGNOSIS — E785 Hyperlipidemia, unspecified: Secondary | ICD-10-CM

## 2021-06-18 DIAGNOSIS — E119 Type 2 diabetes mellitus without complications: Secondary | ICD-10-CM | POA: Diagnosis not present

## 2021-06-18 DIAGNOSIS — E782 Mixed hyperlipidemia: Secondary | ICD-10-CM

## 2021-06-18 DIAGNOSIS — E6609 Other obesity due to excess calories: Secondary | ICD-10-CM | POA: Diagnosis not present

## 2021-06-18 DIAGNOSIS — I10 Essential (primary) hypertension: Secondary | ICD-10-CM

## 2021-06-18 MED ORDER — GLIPIZIDE 5 MG PO TABS: 10.0000 mg | ORAL_TABLET | Freq: Every day | ORAL | 1 refills | Status: DC

## 2021-06-18 MED ORDER — SIMVASTATIN 40 MG PO TABS: ORAL_TABLET | ORAL | 1 refills | Status: DC

## 2021-06-18 MED ORDER — METFORMIN HCL 1000 MG PO TABS: 1000.0000 mg | ORAL_TABLET | Freq: Two times a day (BID) | ORAL | 0 refills | Status: DC

## 2021-06-18 NOTE — Progress Notes (Signed)
06/18/2021, 7:13 PM   Endocrinology follow-up note  Subjective:    Patient ID: Raymond Malone, male    DOB: 1976-04-02.  Raymond Malone is being seen in follow-up after he was seen in consultation for management of currently uncontrolled symptomatic diabetes requested by  No primary care provider on file..   Past Medical History:  Diagnosis Date   Abscess, axilla 10/06/2016   Alcohol dependence ([place]) 10/06/2016   Dyshidrotic eczema 10/27/2016   GERD (gastroesophageal reflux disease)    Hyperlipidemia    Hyperosmolar non-ketotic state in patient with type 2 diabetes mellitus ([place]) 10/06/2016   Hypertension    MRSA (methicillin resistant Staphylococcus aureus)    Skin abscess    Type 2 diabetes mellitus ([place])     Past Surgical History:  Procedure Laterality Date   ANKLE SURGERY     HAND SURGERY     boxer's fracture L hand   INCISION AND DRAINAGE PERIRECTAL ABSCESS     MYRINGOTOMY WITH TUBE PLACEMENT Bilateral 07/22/2020   Procedure: BILATERAL MYRINGOTOMY WITH TUBE PLACEMENT;  Surgeon: Leta Baptist, MD;  Location: [facility];  Service: ENT;  Laterality: Bilateral;   SVT ABLATION N/A 04/16/2021   Procedure: SVT ABLATION;  Surgeon: Evans Lance, MD;  Location: [facility];  Service: Cardiovascular;  Laterality: N/A;    Social History   Socioeconomic History   Marital status: Significant Other    Spouse name: Shameka   Number of children: 7   Years of education: 14   Highest education level: Not on file  Occupational History   Occupation: factory work, sorting  Tobacco Use   Smoking status: Every Day    Packs/day: 1.00    Types: Cigarettes    Start date: 05/11/1994   Smokeless tobacco: Never  Vaping Use   Vaping Use: Never used  Substance and Sexual Activity   Alcohol use: Not Currently    Comment: Occasional   Drug use: Not Currently    Types: Marijuana    Comment: last=yesterday   Sexual activity: Yes    Birth  control/protection: Condom  Other Topics Concern   Not on file  Social History Narrative   Lives with fiancee, mother, and 7 children, 3 are biological his, 2 are hers and 1 nephew   No pets in the home      Enjoys: spending time with the kids      Diet: Eats all food groups, does not have a true focus for diabetes   Caffeine: Drinks about 1 or 2 sodas a week overall has reduced his caffeine intake sometimes some tea   Water: Reports taking 6-8 times a day for more      Wears a seatbelt, does not use his phone or driving   Smoke detectors at home   Does not have fire extinguisher at this time   No weapons in the home   Social Determinants of Health   Financial Resource Strain: Not on file  Food Insecurity: Not on file  Transportation Needs: Not on file  Physical Activity: Not on file  Stress: Not on file  Social Connections: Not on file    Family History  Problem Relation Age of Onset   Hypertension Mother     Outpatient Encounter Medications as of 06/18/2021  Medication Sig   blood glucose meter kit and supplies Dispense based on patient and insurance preference. Use up to four times daily as directed. (FOR ICD-10  E10.9, E11.9).   Blood Glucose Monitoring Suppl (ACCU-CHEK GUIDE ME) w/Device KIT 1 Piece by Does not apply route as directed.   glipiZIDE (GLUCOTROL) 5 MG tablet Take 2 tablets (10 mg total) by mouth daily before breakfast.   glucose blood (ACCU-CHEK GUIDE) test strip Use to test glucose 4 times a day.   Insulin Pen Needle (PEN NEEDLES) 31G X 5 MM MISC 1 each by Does not apply route in the morning, at noon, in the evening, and at bedtime.   lisinopril-hydrochlorothiazide (ZESTORETIC) 20-25 MG tablet Take 1 tablet by mouth once daily   metFORMIN (GLUCOPHAGE) 1000 MG tablet Take 1 tablet (1,000 mg total) by mouth 2 (two) times daily with a meal.   metoprolol succinate (TOPROL XL) 25 MG 24 hr tablet Take 1 tablet (25 mg total) by mouth daily. (Patient taking  differently: Take 25 mg by mouth in the morning.)   pantoprazole (PROTONIX) 20 MG tablet Take 1 tablet (20 mg total) by mouth daily.   SEMGLEE, YFGN, 100 UNIT/ML Pen Inject 20 Units into the skin at bedtime.   simvastatin (ZOCOR) 40 MG tablet Take 1 tablet by mouth in the evening   [DISCONTINUED] glipiZIDE (GLUCOTROL) 10 MG tablet Take 10 mg by mouth daily before breakfast.   [DISCONTINUED] insulin aspart (NOVOLOG) 100 UNIT/ML FlexPen Inject 1 Units into the skin 3 (three) times daily with meals. (Patient not taking: Reported on 04/11/2021)   [DISCONTINUED] insulin glargine (LANTUS SOLOSTAR) 100 UNIT/ML Solostar Pen Inject 30 Units into the skin at bedtime. (Patient not taking: Reported on 06/18/2021)   [DISCONTINUED] metFORMIN (GLUCOPHAGE) 1000 MG tablet TAKE 1 TABLET BY MOUTH TWICE DAILY WITH MEALS   [DISCONTINUED] simvastatin (ZOCOR) 40 MG tablet Take 1 tablet by mouth in the evening   No facility-administered encounter medications on file as of 06/18/2021.    ALLERGIES: No Known Allergies  VACCINATION STATUS: Immunization History  Administered Date(s) Administered   Influenza,inj,Quad PF,6+ Mos 02/09/2020, 02/10/2021   Pneumococcal Polysaccharide-23 10/07/2016   Tdap 10/19/2012    Diabetes He presents for his follow-up diabetic visit. He has type 2 diabetes mellitus. Onset time: He was diagnosed at approximate age of 46 years. His disease course has been improving (He was seen in 2018 immediately after his diagnosis, separate from care.). There are no hypoglycemic associated symptoms. Pertinent negatives for hypoglycemia include no confusion, headaches, pallor or seizures. Pertinent negatives for diabetes include no blurred vision, no chest pain, no fatigue, no polydipsia, no polyphagia, no polyuria and no weakness. There are no hypoglycemic complications. Symptoms are improving. (Obesity, smoking.) Risk factors for coronary artery disease include dyslipidemia and hypertension. Current  diabetic treatments: She is currently on Lantus 16 units, NovoLog 4-14 units 3 times daily AC, glipizide 10 mg p.o. daily, metformin 1000 mg p.o. twice daily. His weight is decreasing steadily. He is following a generally unhealthy diet. When asked about meal planning, he reported none. He has not had a previous visit with a dietitian. He rarely participates in exercise. His home blood glucose trend is decreasing steadily. His breakfast blood glucose range is generally 130-140 mg/dl. His bedtime blood glucose range is generally 140-180 mg/dl. His overall blood glucose range is 140-180 mg/dl. (He brought in his meter showing average blood glucose continued to improve , 148-157 x 14 days.  His last  A1c was 11.7%.  He did not document or report any hypoglycemia. ) An ACE inhibitor/angiotensin II receptor blocker is being taken.  Hyperlipidemia This is a chronic problem. The problem is  resistant. Exacerbating diseases include diabetes and obesity. Pertinent negatives include no chest pain, myalgias or shortness of breath. Current antihyperlipidemic treatment includes statins. Risk factors for coronary artery disease include diabetes mellitus, dyslipidemia, family history, hypertension, male sex, obesity and a sedentary lifestyle.  Hypertension This is a chronic problem. The current episode started more than 1 year ago. Pertinent negatives include no blurred vision, chest pain, headaches, neck pain, palpitations or shortness of breath. Risk factors for coronary artery disease include dyslipidemia, diabetes mellitus, male gender, obesity, sedentary lifestyle, smoking/tobacco exposure and family history. Past treatments include ACE inhibitors.    Review of Systems  Constitutional:  Negative for chills, fatigue, fever and unexpected weight change.  HENT:  Negative for dental problem, mouth sores and trouble swallowing.   Eyes:  Negative for blurred vision and visual disturbance.  Respiratory:  Negative for  cough, choking, chest tightness, shortness of breath and wheezing.   Cardiovascular:  Negative for chest pain, palpitations and leg swelling.  Gastrointestinal:  Negative for abdominal distention, abdominal pain, constipation, diarrhea, nausea and vomiting.  Endocrine: Negative for polydipsia, polyphagia and polyuria.  Genitourinary:  Negative for dysuria, flank pain, hematuria and urgency.  Musculoskeletal:  Negative for back pain, gait problem, myalgias and neck pain.  Skin:  Negative for pallor, rash and wound.  Neurological:  Negative for seizures, syncope, weakness, numbness and headaches.  Psychiatric/Behavioral:  Negative for confusion and dysphoric mood.    Objective:    Vitals with BMI 06/18/2021 04/17/2021 04/17/2021  Height 5' 11"  - -  Weight 239 lbs 3 oz - -  BMI 54.56 - -  Systolic 256 389 373  Diastolic 70 85 76  Pulse 88 - 84    BP 118/70    Pulse 88    Ht 5' 11"  (1.803 m)    Wt 239 lb 3.2 oz (108.5 kg)    BMI 33.36 kg/m   Wt Readings from Last 3 Encounters:  06/18/21 239 lb 3.2 oz (108.5 kg)  04/16/21 240 lb (108.9 kg)  03/18/21 246 lb 12.8 oz (111.9 kg)        CMP ( most recent) CMP     Component Value Date/Time   NA 136 06/10/2021 1123   K 4.2 06/10/2021 1123   CL 98 06/10/2021 1123   CO2 20 06/10/2021 1123   GLUCOSE 189 (H) 06/10/2021 1123   GLUCOSE 164 (H) 04/15/2021 1106   BUN 16 06/10/2021 1123   CREATININE 0.98 06/10/2021 1123   CREATININE 1.02 02/08/2017 0852   CALCIUM 10.2 06/10/2021 1123   PROT 7.0 06/10/2021 1123   ALBUMIN 4.7 06/10/2021 1123   AST 22 06/10/2021 1123   ALT 29 06/10/2021 1123   ALKPHOS 96 06/10/2021 1123   BILITOT 0.7 06/10/2021 1123   GFRNONAA >60 04/15/2021 1106   GFRAA 114 02/09/2020 1013     Diabetic Labs (most recent): Lab Results  Component Value Date   HGBA1C 8.9 (H) 04/16/2021   HGBA1C 11.7 (A) 03/05/2021   HGBA1C 8.7 (A) 02/09/2020   HGBA1C 8.7 02/09/2020   HGBA1C 8.7 (A) 02/09/2020   HGBA1C 8.7 (A)  02/09/2020     Lipid Panel ( most recent) Lipid Panel     Component Value Date/Time   CHOL 151 06/10/2021 1123   TRIG 146 06/10/2021 1123   HDL 33 (L) 06/10/2021 1123   CHOLHDL 4.6 06/10/2021 1123   CHOLHDL 4.3 02/08/2017 0852   LDLCALC 92 06/10/2021 1123   LDLCALC 108 (H) 02/08/2017 0852   LABVLDL 26 06/10/2021  1123      Lab Results  Component Value Date   TSH 1.030 06/10/2021   TSH 1.412 11/28/2018   TSH 2.00 02/08/2017   FREET4 1.50 06/10/2021   FREET4 1.3 02/08/2017      Assessment & Plan:   1. Type 2 diabetes mellitus without complication, with long-term current use of insulin (HCC)   - Raymond Malone has currently uncontrolled symptomatic type 2 DM since  46 years of age.  He brought in his meter showing average blood glucose continued to improve , 148-157 x 14 days.  His last  A1c was 11.7%.  He did not document or report any hypoglycemia.  Recent labs reviewed. - I had a long discussion with him about the progressive nature of diabetes and the pathology behind its complications.  He was seen in this practice in 2018, disappeared from care. -his diabetes is complicated by obesity/sedentary life, smoking, hypertension, hyperlipidemia, nonadherence and he remains at a high risk for more acute and chronic complications which include CAD, CVA, CKD, retinopathy, and neuropathy. These are all discussed in detail with him.  - I have counseled him on diet  and weight management  by adopting a carbohydrate restricted/protein rich diet. Patient is encouraged to switch to  unprocessed or minimally processed     complex starch and increased protein intake (animal or plant source), fruits, and vegetables. -  he is advised to stick to a routine mealtimes to eat 3 meals  a day and avoid unnecessary snacks ( to snack only to correct hypoglycemia).   - he acknowledges that there is a room for improvement in his food and drink choices. - Suggestion is made for him to avoid simple  carbohydrates  from his diet including Cakes, Sweet Desserts, Ice Cream, Soda (diet and regular), Sweet Tea, Candies, Chips, Cookies, Store Bought Juices, Alcohol , Artificial Sweeteners,  Coffee Creamer, and "Sugar-free" Products, Lemonade. This will help patient to have more stable blood glucose profile and potentially avoid unintended weight gain.  The following Lifestyle Medicine recommendations according to [place]  [facility]) were discussed and and offered to patient and he  agrees to start the journey:  A. Whole Foods, Plant-Based Nutrition comprising of fruits and vegetables, plant-based proteins, whole-grain carbohydrates was discussed in detail with the patient.   A list for source of those nutrients were also provided to the patient.  Patient will use only water or unsweetened tea for hydration. B.  The need to stay away from risky substances including alcohol, smoking; obtaining 7 to 9 hours of restorative sleep, at least 150 minutes of moderate intensity exercise weekly, the importance of healthy social connections,  and stress management techniques were discussed. C.  A full color page of  Calorie density of various food groups per pound showing examples of each food groups was provided to the patient.    - he will be scheduled with Raymond Malone, RDN, CDE for diabetes education.  - I have approached him with the following individualized plan to manage  his diabetes and patient agrees:   -In light of his presentation with target glycemic profile, he will not need prandial insulin for now.    -He will however continue to need his basal insulin, advised to lower his Semglee to 20 units nightly, continue to hold NovoLog.  He is advised and agrees to monitor blood glucose twice a day-daily before breakfast and at bedtime.  - he is warned not to take  insulin without proper monitoring per orders.  - he is encouraged to call clinic for blood glucose levels less  than 70 or above 200 mg /dl. - he is advised to continue metformin 1000 mg p.o. twice daily, therapeutically suitable for patient . -He is advised to continue glipizide to 5 mg XL p.o. daily at breakfast.  - he will be considered for incretin therapy as appropriate next visit.  - Specific targets for  A1c;  LDL, HDL,  and Triglycerides were discussed with the patient.  2) Blood Pressure /Hypertension: His BP is controlled to target.     he is advised to continue his current medications including lisinopril/HCTZ 20/25 mg p.o. daily with breakfast .   3) Lipids/Hyperlipidemia:   Review of his recent lipid panel showed un controlled  LDL at 108 .  he  is advised to continue    simvastatin 40 mg daily at bedtime.  Side effects and precautions discussed with him.  4)  Weight/Diet:  Body mass index is 33.36 kg/m.  -   clearly complicating his diabetes care.   he is  a candidate for weight loss. I discussed with him the fact that loss of 5 - 10% of his  current body weight will have the most impact on his diabetes management.  Exercise, and detailed carbohydrates information provided  -  detailed on discharge instructions.  5) Chronic Care/Health Maintenance:  -he  is on ACEI/ARB and Statin medications and  is encouraged to initiate and continue to follow up with Ophthalmology, Dentist,  Podiatrist at least yearly or according to recommendations, and advised to  quit smoking. I have recommended yearly flu vaccine and pneumonia vaccine at least every 5 years; moderate intensity exercise for up to 150 minutes weekly; and  sleep for at least 7 hours a day.  - he is  advised to maintain close follow up with No primary care provider on file. for primary care needs, as well as his other providers for optimal and coordinated care.     I spent 41 minutes in the care of the patient today including review of labs from [facility], Lipids, Thyroid Function, Hematology (current and previous including abstractions  from other facilities); face-to-face time discussing  his blood glucose readings/logs, discussing hypoglycemia and hyperglycemia episodes and symptoms, medications doses, his options of short and long term treatment based on the latest standards of care / guidelines;  discussion about incorporating lifestyle medicine;  and documenting the encounter.    Please refer to Patient Instructions for Blood Glucose Monitoring and Insulin/Medications Dosing Guide"  in media tab for additional information. Please  also refer to " Patient Self Inventory" in the Media  tab for reviewed elements of pertinent patient history.  Raymond Malone participated in the discussions, expressed understanding, and voiced agreement with the above plans.  All questions were answered to his satisfaction. he is encouraged to contact clinic should he have any questions or concerns prior to his return visit.    Follow up plan: - Return in about 8 weeks (around 08/13/2021) for Bring Meter and Logs- A1c in Office.  Glade Lloyd, MD [facility] [address] [place], [place] [address] Phone: [phone]  Fax: [phone]    06/18/2021, 7:13 PM  This note was partially dictated with voice recognition software. Similar sounding words can be transcribed inadequately or may not  be corrected upon review.

## 2021-06-18 NOTE — Patient Instructions (Signed)

## 2021-06-25 ENCOUNTER — Encounter: Payer: Self-pay | Admitting: Internal Medicine

## 2021-06-25 ENCOUNTER — Other Ambulatory Visit: Payer: Self-pay

## 2021-06-25 ENCOUNTER — Ambulatory Visit (INDEPENDENT_AMBULATORY_CARE_PROVIDER_SITE_OTHER): Payer: Medicaid Other | Admitting: Internal Medicine

## 2021-06-25 VITALS — BP 138/82 | HR 88 | Ht 71.0 in | Wt 242.1 lb

## 2021-06-25 DIAGNOSIS — I471 Supraventricular tachycardia: Secondary | ICD-10-CM | POA: Diagnosis not present

## 2021-06-25 NOTE — Progress Notes (Signed)
HPI Mr. Casebeer returns today for followup. He is a pleasant 46 yo man with a h/o SVT who underwent EP study and ablation several weeks ago. He has done well in the interim with no chest pain, sob, or palpitations. He feels well.  No Known Allergies   Current Outpatient Medications  Medication Sig Dispense Refill   blood glucose meter kit and supplies Dispense based on patient and insurance preference. Use up to four times daily as directed. (FOR ICD-10 E10.9, E11.9). 1 each 0   Blood Glucose Monitoring Suppl (ACCU-CHEK GUIDE ME) w/Device KIT 1 Piece by Does not apply route as directed. 1 kit 0   glipiZIDE (GLUCOTROL) 5 MG tablet Take 2 tablets (10 mg total) by mouth daily before breakfast. 90 tablet 1   glucose blood (ACCU-CHEK GUIDE) test strip Use to test glucose 4 times a day. 150 each 2   Insulin Pen Needle (PEN NEEDLES) 31G X 5 MM MISC 1 each by Does not apply route in the morning, at noon, in the evening, and at bedtime. 200 each 3   lisinopril-hydrochlorothiazide (ZESTORETIC) 20-25 MG tablet Take 1 tablet by mouth once daily 90 tablet 0   metFORMIN (GLUCOPHAGE) 1000 MG tablet Take 1 tablet (1,000 mg total) by mouth 2 (two) times daily with a meal. 180 tablet 0   metoprolol succinate (TOPROL XL) 25 MG 24 hr tablet Take 1 tablet (25 mg total) by mouth daily. (Patient taking differently: Take 25 mg by mouth in the morning.) 90 tablet 1   pantoprazole (PROTONIX) 20 MG tablet Take 1 tablet (20 mg total) by mouth daily. 90 tablet 1   SEMGLEE, YFGN, 100 UNIT/ML Pen Inject 20 Units into the skin at bedtime.     simvastatin (ZOCOR) 40 MG tablet Take 1 tablet by mouth in the evening 90 tablet 1   No current facility-administered medications for this visit.     Past Medical History:  Diagnosis Date   Abscess, axilla 10/06/2016   Alcohol dependence ([place]) 10/06/2016   Dyshidrotic eczema 10/27/2016   GERD (gastroesophageal reflux disease)    Hyperlipidemia    Hyperosmolar non-ketotic  state in patient with type 2 diabetes mellitus ([place]) 10/06/2016   Hypertension    MRSA (methicillin resistant Staphylococcus aureus)    Skin abscess    Type 2 diabetes mellitus ([place])     ROS:   All systems reviewed and negative except as noted in the HPI.   Past Surgical History:  Procedure Laterality Date   ANKLE SURGERY     HAND SURGERY     boxer's fracture L hand   INCISION AND DRAINAGE PERIRECTAL ABSCESS     MYRINGOTOMY WITH TUBE PLACEMENT Bilateral 07/22/2020   Procedure: BILATERAL MYRINGOTOMY WITH TUBE PLACEMENT;  Surgeon: Leta Baptist, MD;  Location: [place];  Service: ENT;  Laterality: Bilateral;   SVT ABLATION N/A 04/16/2021   Procedure: SVT ABLATION;  Surgeon: Evans Lance, MD;  Location: [place] [facility];  Service: Cardiovascular;  Laterality: N/A;     Family History  Problem Relation Age of Onset   Hypertension Mother      Social History   Socioeconomic History   Marital status: Significant Other    Spouse name: Shameka   Number of children: 7   Years of education: 14   Highest education level: Not on file  Occupational History   Occupation: factory work, sorting  Tobacco Use   Smoking status: Former    Packs/day: 1.00  Types: Cigarettes    Start date: 05/11/1994   Smokeless tobacco: Never  Vaping Use   Vaping Use: Never used  Substance and Sexual Activity   Alcohol use: Not Currently    Comment: Occasional   Drug use: Not Currently    Types: Marijuana    Comment: last=yesterday   Sexual activity: Yes    Birth control/protection: Condom  Other Topics Concern   Not on file  Social History Narrative   Lives with fiancee, mother, and 7 children, 3 are biological his, 2 are hers and 1 nephew   No pets in the home      Enjoys: spending time with the kids      Diet: Eats all food groups, does not have a true focus for diabetes   Caffeine: Drinks about 1 or 2 sodas a week overall has reduced his caffeine intake sometimes some tea    Water: Reports taking 6-8 times a day for more      Wears a seatbelt, does not use his phone or driving   Smoke detectors at home   Does not have fire extinguisher at this time   No weapons in the home   Social Determinants of Health   Financial Resource Strain: Not on file  Food Insecurity: Not on file  Transportation Needs: Not on file  Physical Activity: Not on file  Stress: Not on file  Social Connections: Not on file  Intimate Partner Violence: Not on file     BP 138/82    Pulse 88    Ht 5' 11"  (1.803 m)    Wt 242 lb 1.6 oz (109.8 kg)    SpO2 97%    BMI 33.77 kg/m   Physical Exam:  Well appearing NAD HEENT: Unremarkable Neck:  No JVD, no thyromegally Lymphatics:  No adenopathy Back:  No CVA tenderness Lungs:  Clear with no wheezes HEART:  Regular rate rhythm, no murmurs, no rubs, no clicks Abd:  soft, positive bowel sounds, no organomegally, no rebound, no guarding Ext:  2 plus pulses, no edema, no cyanosis, no clubbing Skin:  No rashes no nodules Neuro:  CN II through XII intact, motor grossly intact  EKG - nsr   Assess/Plan:  SVT - he is doing well s/p ablation and is asymptomatic. He will undergo watchful waiting. HTN - he is encouraged to lose weight and avoid salty foods. Carleene Overlie Demetreus Lothamer,MD

## 2021-06-25 NOTE — Patient Instructions (Signed)
Medication Instructions:  Your physician recommends that you continue on your current medications as directed. Please refer to the Current Medication list given to you today.  *If you need a refill on your cardiac medications before your next appointment, please call your pharmacy*   Lab Work: NONE   If you have labs (blood work) drawn today and your tests are completely normal, you will receive your results only by: [place] (if you have MyChart) OR A paper copy in the mail If you have any lab test that is abnormal or we need to change your treatment, we will call you to review the results.   Testing/Procedures: NONE    Follow-Up: At [facility], you and your health needs are our priority.  As part of our continuing mission to provide you with exceptional heart care, we have created designated Provider Care Teams.  These Care Teams include your primary Cardiologist (physician) and Advanced Practice Providers (APPs -  Physician Assistants and Nurse Practitioners) who all work together to provide you with the care you need, when you need it.  We recommend signing up for the patient portal called "MyChart".  Sign up information is provided on this After Visit Summary.  MyChart is used to connect with patients for Virtual Visits (Telemedicine).  Patients are able to view lab/test results, encounter notes, upcoming appointments, etc.  Non-urgent messages can be sent to your provider as well.   To learn more about what you can do with MyChart, go to [email or web address].    Your next appointment:    As Needed   The format for your next appointment:   In Person  Provider:   Cristopher Peru, MD    Other Instructions Thank you for choosing [facility]!

## 2021-08-13 ENCOUNTER — Ambulatory Visit: Payer: Medicaid Other | Admitting: "Endocrinology

## 2021-08-19 ENCOUNTER — Ambulatory Visit (INDEPENDENT_AMBULATORY_CARE_PROVIDER_SITE_OTHER): Payer: Medicaid Other | Admitting: "Endocrinology

## 2021-08-19 ENCOUNTER — Encounter: Payer: Self-pay | Admitting: "Endocrinology

## 2021-08-19 VITALS — BP 114/78 | HR 80 | Ht 71.0 in | Wt 233.8 lb

## 2021-08-19 DIAGNOSIS — I10 Essential (primary) hypertension: Secondary | ICD-10-CM

## 2021-08-19 DIAGNOSIS — E119 Type 2 diabetes mellitus without complications: Secondary | ICD-10-CM

## 2021-08-19 DIAGNOSIS — E782 Mixed hyperlipidemia: Secondary | ICD-10-CM | POA: Diagnosis not present

## 2021-08-19 DIAGNOSIS — Z6832 Body mass index (BMI) 32.0-32.9, adult: Secondary | ICD-10-CM

## 2021-08-19 DIAGNOSIS — E6609 Other obesity due to excess calories: Secondary | ICD-10-CM | POA: Diagnosis not present

## 2021-08-19 DIAGNOSIS — Z794 Long term (current) use of insulin: Secondary | ICD-10-CM | POA: Diagnosis not present

## 2021-08-19 LAB — POCT GLYCOSYLATED HEMOGLOBIN (HGB A1C): HbA1c, POC (controlled diabetic range): 6.8 % (ref 0.0–7.0)

## 2021-08-19 NOTE — Patient Instructions (Signed)

## 2021-08-19 NOTE — Progress Notes (Signed)
? ?     08/19/2021, 4:19 PM ? ? ?Endocrinology follow-up note ? ?Subjective:  ? ? Patient ID: Raymond Malone, male    DOB: [birth year].  ?Raymond Malone is being seen in follow-up after he was seen in consultation for management of currently uncontrolled symptomatic diabetes requested by  Pcp, No. ? ? ?Past Medical History:  ?Diagnosis Date  ? Abscess, axilla 10/06/2016  ? Alcohol dependence ([place]) 10/06/2016  ? Dyshidrotic eczema 10/27/2016  ? GERD (gastroesophageal reflux disease)   ? Hyperlipidemia   ? Hyperosmolar non-ketotic state in patient with type 2 diabetes mellitus ([place]) 10/06/2016  ? Hypertension   ? MRSA (methicillin resistant Staphylococcus aureus)   ? Skin abscess   ? Type 2 diabetes mellitus ([place])   ? ? ?Past Surgical History:  ?Procedure Laterality Date  ? ANKLE SURGERY    ? HAND SURGERY    ? boxer's fracture L hand  ? INCISION AND DRAINAGE PERIRECTAL ABSCESS    ? MYRINGOTOMY WITH TUBE PLACEMENT Bilateral 07/22/2020  ? Procedure: BILATERAL MYRINGOTOMY WITH TUBE PLACEMENT;  Surgeon: Leta Baptist, MD;  Location: [place];  Service: ENT;  Laterality: Bilateral;  ? SVT ABLATION N/A 04/16/2021  ? Procedure: SVT ABLATION;  Surgeon: Evans Lance, MD;  Location: [facility];  Service: Cardiovascular;  Laterality: N/A;  ? ? ?Social History  ? ?Socioeconomic History  ? Marital status: Significant Other  ?  Spouse name: Fernanda Drum  ? Number of children: 7  ? Years of education: 2  ? Highest education level: Not on file  ?Occupational History  ? Occupation: factory work, sorting  ?Tobacco Use  ? Smoking status: Former  ?  Packs/day: 1.00  ?  Types: Cigarettes  ?  Start date: 05/11/1994  ? Smokeless tobacco: Never  ?Vaping Use  ? Vaping Use: Every day  ?Substance and Sexual Activity  ? Alcohol use: Not Currently  ?  Comment: Occasional  ? Drug use: Not Currently  ?  Types: Marijuana  ?  Comment: last=yesterday  ? Sexual activity: Yes  ?  Birth control/protection: Condom  ?Other Topics  Concern  ? Not on file  ?Social History Narrative  ? Lives with fiancee, mother, and 7 children, 3 are biological his, 2 are hers and 1 nephew  ? No pets in the home  ?   ? Enjoys: spending time with the kids  ?   ? Diet: Eats all food groups, does not have a true focus for diabetes  ? Caffeine: Drinks about 1 or 2 sodas a week overall has reduced his caffeine intake sometimes some tea  ? Water: Reports taking 6-8 times a day for more  ?   ? Wears a seatbelt, does not use his phone or driving  ? Smoke detectors at home  ? Does not have fire extinguisher at this time  ? No weapons in the home  ? ?Social Determinants of Health  ? ?Financial Resource Strain: Not on file  ?Food Insecurity: Not on file  ?Transportation Needs: Not on file  ?Physical Activity: Not on file  ?Stress: Not on file  ?Social Connections: Not on file  ? ? ?Family History  ?Problem Relation Age of Onset  ? Hypertension Mother   ? ? ?Outpatient Encounter Medications as of 08/19/2021  ?Medication Sig  ? blood glucose meter kit and supplies Dispense based on patient and insurance preference. Use up to four times daily as directed. (FOR ICD-10 E10.9, E11.9).  ? Blood  Glucose Monitoring Suppl (ACCU-CHEK GUIDE ME) w/Device KIT 1 Piece by Does not apply route as directed.  ? glipiZIDE (GLUCOTROL) 5 MG tablet Take 2 tablets (10 mg total) by mouth daily before breakfast. (Patient taking differently: Take 5 mg by mouth daily before breakfast.)  ? glucose blood (ACCU-CHEK GUIDE) test strip Use to test glucose 4 times a day.  ? Insulin Pen Needle (PEN NEEDLES) 31G X 5 MM MISC 1 each by Does not apply route in the morning, at noon, in the evening, and at bedtime.  ? lisinopril-hydrochlorothiazide (ZESTORETIC) 20-25 MG tablet Take 1 tablet by mouth once daily  ? metFORMIN (GLUCOPHAGE) 1000 MG tablet Take 1 tablet (1,000 mg total) by mouth 2 (two) times daily with a meal.  ? metoprolol succinate (TOPROL XL) 25 MG 24 hr tablet Take 1 tablet (25 mg total) by mouth  daily. (Patient taking differently: Take 25 mg by mouth in the morning.)  ? pantoprazole (PROTONIX) 20 MG tablet Take 1 tablet (20 mg total) by mouth daily.  ? SEMGLEE, YFGN, 100 UNIT/ML Pen Inject 10 Units into the skin at bedtime.  ? simvastatin (ZOCOR) 40 MG tablet Take 1 tablet by mouth in the evening  ? ?No facility-administered encounter medications on file as of 08/19/2021.  ? ? ?ALLERGIES: ?No Known Allergies ? ?VACCINATION STATUS: ?Immunization History  ?Administered Date(s) Administered  ? Influenza,inj,Quad PF,6+ Mos 02/09/2020, 02/10/2021  ? Pneumococcal Polysaccharide-23 10/07/2016  ? Tdap 10/19/2012  ? ? ?Diabetes ?He presents for his follow-up diabetic visit. He has type 2 diabetes mellitus. Onset time: He was diagnosed at approximate age of 45 years. His disease course has been improving (He was seen in 2018 immediately after his diagnosis, separate from care.). There are no hypoglycemic associated symptoms. Pertinent negatives for hypoglycemia include no confusion, headaches, pallor or seizures. Pertinent negatives for diabetes include no blurred vision, no chest pain, no fatigue, no polydipsia, no polyphagia, no polyuria and no weakness. There are no hypoglycemic complications. Symptoms are improving. (Obesity, smoking.) Risk factors for coronary artery disease include dyslipidemia and hypertension. Current diabetic treatments: She is currently on Lantus 16 units, NovoLog 4-14 units 3 times daily AC, glipizide 10 mg p.o. daily, metformin 1000 mg p.o. twice daily. His weight is decreasing steadily (He lost 13 pounds overall.). He is following a generally unhealthy diet. When asked about meal planning, he reported none. He has not had a previous visit with a dietitian. He rarely participates in exercise. His home blood glucose trend is decreasing steadily. His breakfast blood glucose range is generally 130-140 mg/dl. His bedtime blood glucose range is generally 130-140 mg/dl. His overall blood glucose  range is 130-140 mg/dl. (Raymond Malone presents with controlled glycemic profile.  His average blood glucose is 130-137 fasting for the last 30 days.  His point-of-care A1c is 6.8% improving from 11.7%.  He did not document or report any hypoglycemia.   ?) An ACE inhibitor/angiotensin II receptor blocker is being taken.  ?Hyperlipidemia ?This is a chronic problem. The problem is resistant. Exacerbating diseases include diabetes and obesity. Pertinent negatives include no chest pain, myalgias or shortness of breath. Current antihyperlipidemic treatment includes statins. Risk factors for coronary artery disease include diabetes mellitus, dyslipidemia, family history, hypertension, male sex, obesity and a sedentary lifestyle.  ?Hypertension ?This is a chronic problem. The current episode started more than 1 year ago. Pertinent negatives include no blurred vision, chest pain, headaches, neck pain, palpitations or shortness of breath. Risk factors for coronary artery disease include dyslipidemia,  diabetes mellitus, male gender, obesity, sedentary lifestyle, smoking/tobacco exposure and family history. Past treatments include ACE inhibitors.  ? ? ?Review of Systems  ?Constitutional:  Negative for chills, fatigue, fever and unexpected weight change.  ?HENT:  Negative for dental problem, mouth sores and trouble swallowing.   ?Eyes:  Negative for blurred vision and visual disturbance.  ?Respiratory:  Negative for cough, choking, chest tightness, shortness of breath and wheezing.   ?Cardiovascular:  Negative for chest pain, palpitations and leg swelling.  ?Gastrointestinal:  Negative for abdominal distention, abdominal pain, constipation, diarrhea, nausea and vomiting.  ?Endocrine: Negative for polydipsia, polyphagia and polyuria.  ?Genitourinary:  Negative for dysuria, flank pain, hematuria and urgency.  ?Musculoskeletal:  Negative for back pain, gait problem, myalgias and neck pain.  ?Skin:  Negative for pallor, rash and wound.   ?Neurological:  Negative for seizures, syncope, weakness, numbness and headaches.  ?Psychiatric/Behavioral:  Negative for confusion and dysphoric mood.   ? ?Objective:  ?  ? ?  08/19/2021  ? 10:29 A

## 2021-09-08 ENCOUNTER — Other Ambulatory Visit: Payer: Self-pay | Admitting: Internal Medicine

## 2021-09-08 ENCOUNTER — Other Ambulatory Visit: Payer: Self-pay | Admitting: "Endocrinology

## 2021-09-08 DIAGNOSIS — E119 Type 2 diabetes mellitus without complications: Secondary | ICD-10-CM

## 2021-09-22 ENCOUNTER — Ambulatory Visit: Payer: Medicaid Other | Admitting: Family Medicine

## 2021-09-22 ENCOUNTER — Encounter: Payer: Self-pay | Admitting: Family Medicine

## 2021-09-22 VITALS — BP 132/88 | HR 89 | Ht 71.0 in | Wt 232.8 lb

## 2021-09-22 DIAGNOSIS — F17200 Nicotine dependence, unspecified, uncomplicated: Secondary | ICD-10-CM

## 2021-09-22 DIAGNOSIS — E119 Type 2 diabetes mellitus without complications: Secondary | ICD-10-CM | POA: Diagnosis not present

## 2021-09-22 DIAGNOSIS — Z1211 Encounter for screening for malignant neoplasm of colon: Secondary | ICD-10-CM

## 2021-09-22 DIAGNOSIS — K219 Gastro-esophageal reflux disease without esophagitis: Secondary | ICD-10-CM

## 2021-09-22 DIAGNOSIS — Z794 Long term (current) use of insulin: Secondary | ICD-10-CM

## 2021-09-22 DIAGNOSIS — I10 Essential (primary) hypertension: Secondary | ICD-10-CM

## 2021-09-22 DIAGNOSIS — E569 Vitamin deficiency, unspecified: Secondary | ICD-10-CM

## 2021-09-22 DIAGNOSIS — Z1159 Encounter for screening for other viral diseases: Secondary | ICD-10-CM

## 2021-09-22 MED ORDER — GLIPIZIDE 5 MG PO TABS: 5.0000 mg | ORAL_TABLET | Freq: Every day | ORAL | 1 refills | Status: DC

## 2021-09-22 MED ORDER — PEN NEEDLES 31G X 5 MM MISC: 1.0000 | Freq: Four times a day (QID) | 3 refills | Status: DC

## 2021-09-22 MED ORDER — LISINOPRIL-HYDROCHLOROTHIAZIDE 20-25 MG PO TABS: 1.0000 | ORAL_TABLET | Freq: Every day | ORAL | 0 refills | Status: DC

## 2021-09-22 MED ORDER — PANTOPRAZOLE SODIUM 20 MG PO TBEC: 20.0000 mg | DELAYED_RELEASE_TABLET | Freq: Every day | ORAL | 1 refills | Status: DC

## 2021-09-22 MED ORDER — INSULIN GLARGINE-YFGN 100 UNIT/ML ~~LOC~~ SOPN: PEN_INJECTOR | SUBCUTANEOUS | 2 refills | Status: DC

## 2021-09-22 MED ORDER — METOPROLOL SUCCINATE ER 25 MG PO TB24: 25.0000 mg | ORAL_TABLET | Freq: Every day | ORAL | 0 refills | Status: DC

## 2021-09-22 MED ORDER — METFORMIN HCL 1000 MG PO TABS: 1000.0000 mg | ORAL_TABLET | Freq: Two times a day (BID) | ORAL | 0 refills | Status: DC

## 2021-09-22 MED ORDER — ACCU-CHEK GUIDE VI STRP: ORAL_STRIP | 2 refills | Status: DC

## 2021-09-22 NOTE — Progress Notes (Signed)
? ?New Patient Office Visit ? ?Subjective:  ?Patient ID: Raymond Malone, male    DOB: Oct 06, 1975  Age: 46 y.o. MRN: [id number] ? ?CC:  ?Chief Complaint  ?Patient presents with  ? New Patient (Initial Visit)  ?  Would like to establish care, unsure if his insurance is accepted here. Needs refills on medications.  ? ? ?HPI ?Raymond Malone is a 46 y.o. male with past medical history of essential hypertension and T2DM presents for establishing care. Reports Following up with his endocrinologist next month to check his hA1C. Highest glucose- 150s and lowest-110s. He checks his blood sugar twice daily, morning and at bedtime. He will like a refill of his medications.  ? ?He reports that he used to smoke 1 pack daily but quit 3-4 months ago. He now vapes.  ? ?Past Medical History:  ?Diagnosis Date  ? Abscess, axilla 10/06/2016  ? Alcohol dependence ([place]) 10/06/2016  ? Dyshidrotic eczema 10/27/2016  ? GERD (gastroesophageal reflux disease)   ? Hyperlipidemia   ? Hyperosmolar non-ketotic state in patient with type 2 diabetes mellitus ([place]) 10/06/2016  ? Hypertension   ? MRSA (methicillin resistant Staphylococcus aureus)   ? Skin abscess   ? Type 2 diabetes mellitus ([place])   ? ? ?Past Surgical History:  ?Procedure Laterality Date  ? ANKLE SURGERY    ? HAND SURGERY    ? boxer's fracture L hand  ? INCISION AND DRAINAGE PERIRECTAL ABSCESS    ? MYRINGOTOMY WITH TUBE PLACEMENT Bilateral 07/22/2020  ? Procedure: BILATERAL MYRINGOTOMY WITH TUBE PLACEMENT;  Surgeon: Leta Baptist, MD;  Location: [place];  Service: ENT;  Laterality: Bilateral;  ? SVT ABLATION N/A 04/16/2021  ? Procedure: SVT ABLATION;  Surgeon: Evans Lance, MD;  Location: [facility];  Service: Cardiovascular;  Laterality: N/A;  ? ? ?Family History  ?Problem Relation Age of Onset  ? Hypertension Mother   ? ? ?Social History  ? ?Socioeconomic History  ? Marital status: Significant Other  ?  Spouse name: Fernanda Drum  ? Number of children: 7  ? Years of  education: 51  ? Highest education level: Not on file  ?Occupational History  ? Occupation: factory work, sorting  ?Tobacco Use  ? Smoking status: Former  ?  Packs/day: 1.00  ?  Types: Cigarettes  ?  Start date: 05/11/1994  ? Smokeless tobacco: Never  ?Vaping Use  ? Vaping Use: Every day  ?Substance and Sexual Activity  ? Alcohol use: Not Currently  ?  Comment: Occasional  ? Drug use: Not Currently  ?  Types: Marijuana  ?  Comment: last=yesterday  ? Sexual activity: Yes  ?  Birth control/protection: Condom  ?Other Topics Concern  ? Not on file  ?Social History Narrative  ? Lives with fiancee, mother, and 7 children, 3 are biological his, 2 are hers and 1 nephew  ? No pets in the home  ?   ? Enjoys: spending time with the kids  ?   ? Diet: Eats all food groups, does not have a true focus for diabetes  ? Caffeine: Drinks about 1 or 2 sodas a week overall has reduced his caffeine intake sometimes some tea  ? Water: Reports taking 6-8 times a day for more  ?   ? Wears a seatbelt, does not use his phone or driving  ? Smoke detectors at home  ? Does not have fire extinguisher at this time  ? No weapons in the home  ? ?  Social Determinants of Health  ? ?Financial Resource Strain: Not on file  ?Food Insecurity: Not on file  ?Transportation Needs: Not on file  ?Physical Activity: Not on file  ?Stress: Not on file  ?Social Connections: Not on file  ?Intimate Partner Violence: Not on file  ? ? ?ROS ?Review of Systems  ?Constitutional:  Negative for chills, fatigue and fever.  ?HENT:  Negative for sinus pressure, sinus pain, sneezing and sore throat.   ?Eyes:  Negative for pain, redness and itching.  ?Respiratory:  Negative for chest tightness and shortness of breath.   ?Cardiovascular:  Negative for chest pain and palpitations.  ?Gastrointestinal:  Negative for constipation, diarrhea, nausea and vomiting.  ?Endocrine: Negative for polydipsia, polyphagia and polyuria.  ?Genitourinary:  Negative for dysuria, frequency and urgency.   ?Musculoskeletal:  Negative for back pain, neck pain and neck stiffness.  ?Skin:  Negative for rash and wound.  ?Neurological:  Positive for dizziness (sometimes), light-headedness (sometimes) and numbness (fingers- started few weeks ago). Negative for tremors and headaches.  ?Psychiatric/Behavioral:  Negative for confusion, self-injury, sleep disturbance and suicidal ideas.   ? ?Objective:  ? ?Today's Vitals: BP 132/88   Pulse 89   Ht _0  (1.803 m)   Wt 232 lb 12.8 oz (105.6 kg)   SpO2 97%   BMI 32.47 kg/m?  ? ?Physical Exam ?Constitutional:   ?   Appearance: He is obese.  ?HENT:  ?   Head: Normocephalic.  ?   Right Ear: External ear normal.  ?   Left Ear: External ear normal.  ?   Nose: No congestion or rhinorrhea.  ?   Mouth/Throat:  ?   Mouth: Mucous membranes are moist.  ?Eyes:  ?   Extraocular Movements: Extraocular movements intact.  ?Cardiovascular:  ?   Rate and Rhythm: Normal rate and regular rhythm.  ?   Pulses: Normal pulses.  ?Pulmonary:  ?   Effort: Pulmonary effort is normal.  ?   Breath sounds: Normal breath sounds.  ?Abdominal:  ?   Palpations: Abdomen is soft.  ?Musculoskeletal:  ?   Cervical back: No rigidity.  ?   Right lower leg: No edema.  ?   Left lower leg: No edema.  ?Lymphadenopathy:  ?   Cervical: No cervical adenopathy.  ?Skin: ?   General: Skin is warm.  ?   Capillary Refill: Capillary refill takes less than 2 seconds.  ?Neurological:  ?   Mental Status: He is alert and oriented to person, place, and time.  ?Psychiatric:  ?   Comments: Normal affect  ? ? ?Assessment & Plan:  ? ?Problem List Items Addressed This Visit   ? ?  ? Cardiovascular and Mediastinum  ? Essential hypertension, benign  ?  Controlled ?Continue treatment regimen ?Refilled medication ?Advised patient to get blood drawn 2-3 days before his next appt ? ?  ?  ? Relevant Medications  ? lisinopril-hydrochlorothiazide (ZESTORETIC) 20-25 MG tablet  ? metoprolol succinate (TOPROL-XL) 25 MG 24 hr tablet  ?  ? Digestive   ? GERD (gastroesophageal reflux disease)  ?  Stable ?Refilled medication ? ?  ?  ? Relevant Medications  ? pantoprazole (PROTONIX) 20 MG tablet  ?  ? Endocrine  ? Type 2 diabetes mellitus without complication, with long-term current use of insulin ([place]) - Primary  ?  Following up with his endocrinologist next month to check his hA1C. ?Refilled medications  ?No symptoms of polyuria polydipsia and polyphagia noted ? ? ?  ?  ?  Relevant Medications  ? lisinopril-hydrochlorothiazide (ZESTORETIC) 20-25 MG tablet  ? metFORMIN (GLUCOPHAGE) 1000 MG tablet  ? insulin glargine-yfgn (SEMGLEE, YFGN,) 100 UNIT/ML Pen  ? glucose blood (ACCU-CHEK GUIDE) test strip  ? Insulin Pen Needle (PEN NEEDLES) 31G X 5 MM MISC  ? glipiZIDE (GLUCOTROL) 5 MG tablet  ? Other Relevant Orders  ? CBC with Differential/Platelet  ? CMP14+EGFR  ? TSH + free T4  ? Lipid panel  ?  ? Other  ? Current smoker  ?  He reports that he used to smoke 1 pack daily but quit 3-4 months ago.  ?He now vapes ?Inform patient that vaping is less harmful than smoking, but it's still not safe ?  ?  ? ?Other Visit Diagnoses   ? ? Colon cancer screening      ? Relevant Orders  ? Cologuard  ? Essential hypertension      ? Relevant Medications  ? lisinopril-hydrochlorothiazide (ZESTORETIC) 20-25 MG tablet  ? metoprolol succinate (TOPROL-XL) 25 MG 24 hr tablet  ? Need for hepatitis C screening test      ? Relevant Orders  ? Hepatitis C Antibody  ? Vitamin deficiency      ? Relevant Orders  ? Vitamin D (25 hydroxy)  ? ?  ? ? ?Outpatient Encounter Medications as of 09/22/2021  ?Medication Sig  ? blood glucose meter kit and supplies Dispense based on patient and insurance preference. Use up to four times daily as directed. (FOR ICD-10 E10.9, E11.9).  ? Blood Glucose Monitoring Suppl (ACCU-CHEK GUIDE ME) w/Device KIT 1 Piece by Does not apply route as directed.  ? simvastatin (ZOCOR) 40 MG tablet Take 1 tablet by mouth in the evening  ? [DISCONTINUED] glipiZIDE (GLUCOTROL) 5 MG  tablet Take 2 tablets (10 mg total) by mouth daily before breakfast. (Patient taking differently: Take 5 mg by mouth daily before breakfast.)  ? [DISCONTINUED] glucose blood (ACCU-CHEK GUIDE) test strip Use to t

## 2021-09-22 NOTE — Patient Instructions (Addendum)
I appreciate the opportunity to provide care to you today! ? ?  ?Follow up:  2 months ? ?Labs: please stop by the lab 2-3 days get your blood drawn (CBC, CMP, TSH, Lipid profile, Vit D) ? ?Screening:  Hep C ? ?Please pick up your medications at the pharmacy ?  ?Please continue to a heart-healthy diet and increase your physical activities. Try to exercise for 19mns at least three times a week.  ? ? ?  ?It was a pleasure to see you and I look forward to continuing to work together on your health and well-being. ?Please do not hesitate to call the office if you need care or have questions about your care. ?  ?Have a wonderful day and week. ?With Gratitude, ?GAlvira MondayMSN, FNP-BC ? ?

## 2021-09-24 NOTE — Assessment & Plan Note (Signed)
He reports that he used to smoke 1 pack daily but quit 3-4 months ago.  ?He now vapes ?Inform patient that vaping is less harmful than smoking, but it's still not safe ?

## 2021-09-24 NOTE — Assessment & Plan Note (Addendum)
Controlled ?Continue treatment regimen ?Refilled medication ?Advised patient to get blood drawn 2-3 days before his next appt ?

## 2021-09-24 NOTE — Assessment & Plan Note (Signed)
Stable ?Refilled medication ?

## 2021-09-24 NOTE — Assessment & Plan Note (Signed)
Following up with his endocrinologist next month to check his hA1C. ?Refilled medications  ?No symptoms of polyuria polydipsia and polyphagia noted ? ?

## 2021-09-25 DIAGNOSIS — Z1211 Encounter for screening for malignant neoplasm of colon: Secondary | ICD-10-CM | POA: Diagnosis not present

## 2021-10-05 LAB — COLOGUARD: COLOGUARD: POSITIVE — AB

## 2021-10-07 ENCOUNTER — Other Ambulatory Visit: Payer: Self-pay | Admitting: Family Medicine

## 2021-10-07 DIAGNOSIS — Z1211 Encounter for screening for malignant neoplasm of colon: Secondary | ICD-10-CM

## 2021-10-07 NOTE — Progress Notes (Signed)
Please inform the patient that I've placed a referral to GI for a colonoscopy; his cologuard was positive.

## 2021-10-09 ENCOUNTER — Encounter (INDEPENDENT_AMBULATORY_CARE_PROVIDER_SITE_OTHER): Payer: Self-pay | Admitting: *Deleted

## 2021-10-23 ENCOUNTER — Other Ambulatory Visit (INDEPENDENT_AMBULATORY_CARE_PROVIDER_SITE_OTHER): Payer: Self-pay

## 2021-10-23 DIAGNOSIS — Z1211 Encounter for screening for malignant neoplasm of colon: Secondary | ICD-10-CM

## 2021-11-10 ENCOUNTER — Telehealth (INDEPENDENT_AMBULATORY_CARE_PROVIDER_SITE_OTHER): Payer: Self-pay

## 2021-11-10 ENCOUNTER — Encounter (INDEPENDENT_AMBULATORY_CARE_PROVIDER_SITE_OTHER): Payer: Self-pay

## 2021-11-10 MED ORDER — PEG 3350-KCL-NA BICARB-NACL 420 G PO SOLR: 4000.0000 mL | ORAL | 0 refills | Status: DC

## 2021-11-10 NOTE — Telephone Encounter (Signed)
Ozil Stettler Ann Stillman Buenger, CMA  ?

## 2021-11-10 NOTE — Telephone Encounter (Signed)
Ok to schedule.  Thanks,  Khalel Alms Castaneda Mayorga, MD Gastroenterology and Hepatology [facility]  

## 2021-11-10 NOTE — Telephone Encounter (Signed)
Referring MD/PCP: Alvira Monday [facility])  Procedure: Tcs   Reason/Indication:  Screening   Has patient had this procedure before?  No   If so, when, by whom and where?    Is there a family history of colon cancer?  Unsure  Who?  What age when diagnosed?    Is patient diabetic? If yes, Type 1 or Type 2   yes, type 2      Does patient have prosthetic heart valve or mechanical valve?  No   Do you have a pacemaker/defibrillator?  no  Has patient ever had endocarditis/atrial fibrillation?  no  Does patient use oxygen? no  Has patient had joint replacement within last 12 months?   No   Is patient constipated or do they take laxatives? no  Does patient have a history of alcohol/drug use?  yes  Have you had a stroke/heart attack last 6 mths? no  Do you take medicine for weight loss?  no  For male patients,: have you had a hysterectomy  n/a                      are you post menopausal n/a                      do you still have your menstrual cycle n/a  Is patient on blood thinner such as Coumadin, Plavix and/or Aspirin? No   Medications: glipizide 5 mg daily in the am , insulin semglee, YFGN 10 units at bedtime, metformin 1000 mg bid, lisinopril/hctz 20-25 mg 24 hr daily, pantoprazole 20 mg daily, simvastatin 40 mg qhs  Allergies: nkda  Medication Adjustment per Dr Jenetta Downer hold metformin the evening prior 1/2 dose of insulin the evening prior no diabetic medications the morning of   Procedure date & time: 12/09/21 at 7:30 am

## 2021-11-21 DIAGNOSIS — Z794 Long term (current) use of insulin: Secondary | ICD-10-CM | POA: Diagnosis not present

## 2021-11-21 DIAGNOSIS — E119 Type 2 diabetes mellitus without complications: Secondary | ICD-10-CM | POA: Diagnosis not present

## 2021-11-22 LAB — COMPREHENSIVE METABOLIC PANEL
ALT: 22 IU/L (ref 0–44)
AST: 12 IU/L (ref 0–40)
Albumin/Globulin Ratio: 2 (ref 1.2–2.2)
Albumin: 4.7 g/dL (ref 4.1–5.1)
Alkaline Phosphatase: 88 IU/L (ref 44–121)
BUN/Creatinine Ratio: 11 (ref 9–20)
BUN: 9 mg/dL (ref 6–24)
Bilirubin Total: 0.7 mg/dL (ref 0.0–1.2)
CO2: 22 mmol/L (ref 20–29)
Calcium: 10 mg/dL (ref 8.7–10.2)
Chloride: 98 mmol/L (ref 96–106)
Creatinine, Ser: 0.83 mg/dL (ref 0.76–1.27)
Globulin, Total: 2.4 g/dL (ref 1.5–4.5)
Glucose: 124 mg/dL — ABNORMAL HIGH (ref 70–99)
Potassium: 3.9 mmol/L (ref 3.5–5.2)
Sodium: 137 mmol/L (ref 134–144)
Total Protein: 7.1 g/dL (ref 6.0–8.5)
eGFR: 110 mL/min/{1.73_m2} (ref >59)

## 2021-11-22 LAB — T4, FREE: Free T4: 1.47 ng/dL (ref 0.82–1.77)

## 2021-11-22 LAB — LIPID PANEL
Chol/HDL Ratio: 3.2 ratio (ref 0.0–5.0)
Cholesterol, Total: 126 mg/dL (ref 100–199)
HDL: 40 mg/dL (ref >39)
LDL Chol Calc (NIH): 68 mg/dL (ref 0–99)
Triglycerides: 93 mg/dL (ref 0–149)
VLDL Cholesterol Cal: 18 mg/dL (ref 5–40)

## 2021-11-22 LAB — TSH: TSH: 1.13 u[IU]/mL (ref 0.450–4.500)

## 2021-11-24 ENCOUNTER — Ambulatory Visit: Payer: Medicaid Other | Admitting: Family Medicine

## 2021-11-25 ENCOUNTER — Encounter: Payer: Self-pay | Admitting: "Endocrinology

## 2021-11-25 ENCOUNTER — Ambulatory Visit (INDEPENDENT_AMBULATORY_CARE_PROVIDER_SITE_OTHER): Payer: Medicaid Other | Admitting: "Endocrinology

## 2021-11-25 VITALS — BP 104/72 | HR 72 | Ht 71.0 in | Wt 233.2 lb

## 2021-11-25 DIAGNOSIS — Z794 Long term (current) use of insulin: Secondary | ICD-10-CM

## 2021-11-25 DIAGNOSIS — I10 Essential (primary) hypertension: Secondary | ICD-10-CM

## 2021-11-25 DIAGNOSIS — E119 Type 2 diabetes mellitus without complications: Secondary | ICD-10-CM | POA: Diagnosis not present

## 2021-11-25 DIAGNOSIS — Z6832 Body mass index (BMI) 32.0-32.9, adult: Secondary | ICD-10-CM

## 2021-11-25 DIAGNOSIS — E6609 Other obesity due to excess calories: Secondary | ICD-10-CM | POA: Diagnosis not present

## 2021-11-25 DIAGNOSIS — E782 Mixed hyperlipidemia: Secondary | ICD-10-CM

## 2021-11-25 LAB — POCT GLYCOSYLATED HEMOGLOBIN (HGB A1C): HbA1c, POC (controlled diabetic range): 6.7 % (ref 0.0–7.0)
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
## 2021-11-25 NOTE — Patient Instructions (Signed)
                                     Advice for Weight Management  -For most of us the best way to lose weight is by diet management. Generally speaking, diet management means consuming less calories intentionally which over time brings about progressive weight loss.  This can be achieved more effectively by avoiding ultra processed carbohydrates, processed meats, unhealthy fats.    It is critically important to know your numbers: how much calorie you are consuming and how much calorie you need. More importantly, our carbohydrates sources should be unprocessed naturally occurring  complex starch food items.  It is always important to balance nutrition also by  appropriate intake of proteins (mainly plant-based), healthy fats/oils, plenty of fruits and vegetables.   -The American College of Lifestyle Medicine (ACL M) recommends nutrition derived mostly from Whole Food, Plant Predominant Sources example an apple instead of applesauce or apple pie. Eat Plenty of vegetables, Mushrooms, fruits, Legumes, Whole Grains, Nuts, seeds in lieu of processed meats, processed snacks/pastries red meat, poultry, eggs.  Use only water or unsweetened tea for hydration.  The College also recommends the need to stay away from risky substances including alcohol, smoking; obtaining 7-9 hours of restorative sleep, at least 150 minutes of moderate intensity exercise weekly, importance of healthy social connections, and being mindful of stress and seek help when it is overwhelming.    -Sticking to a routine mealtime to eat 3 meals a day and avoiding unnecessary snacks is shown to have a big role in weight control. Under normal circumstances, the only time we burn stored energy is when we are hungry, so allow  some hunger to take place- hunger means no food between appropriate meal times, only water.  It is not advisable to starve.   -It is better to avoid simple carbohydrates including:  Cakes, Sweet Desserts, Ice Cream, Soda (diet and regular), Sweet Tea, Candies, Chips, Cookies, Store Bought Juices, Alcohol in Excess of  1-2 drinks a day, Lemonade,  Artificial Sweeteners, Doughnuts, Coffee Creamers, "Sugar-free" Products, etc, etc.  This is not a complete list.....    -Consulting with certified diabetes educators is proven to provide you with the most accurate and current information on diet.  Also, you may be  interested in discussing diet options/exchanges , we can schedule a visit with Raymond Malone, RDN, CDE for individualized nutrition education.  -Exercise: If you are able: 30 -60 minutes a day ,4 days a week, or 150 minutes of moderate intensity exercise weekly.    The longer the better if tolerated.  Combine stretch, strength, and aerobic activities.  If you were told in the past that you have high risk for cardiovascular diseases, or if you are currently symptomatic, you may seek evaluation by your heart doctor prior to initiating moderate to intense exercise programs.                                  Additional Care Considerations for Diabetes/Prediabetes   -Diabetes  is a chronic disease.  The most important care consideration is regular follow-up with your diabetes care provider with the goal being avoiding or delaying its complications and to take advantage of advances in medications and technology.  If appropriate actions are taken early enough, type 2 diabetes can even be   reversed.  Seek information from the right source.  - Whole Food, Plant Predominant Nutrition is highly recommended: Eat Plenty of vegetables, Mushrooms, fruits, Legumes, Whole Grains, Nuts, seeds in lieu of processed meats, processed snacks/pastries red meat, poultry, eggs as recommended by American College of  Lifestyle Medicine (ACLM).  -Type 2 diabetes is known to coexist with other important comorbidities such as high blood pressure and high cholesterol.  It is critical to control not only the  diabetes but also the high blood pressure and high cholesterol to minimize and delay the risk of complications including coronary artery disease, stroke, amputations, blindness, etc.  The good news is that this diet recommendation for type 2 diabetes is also very helpful for managing high cholesterol and high blood blood pressure.  - Studies showed that people with diabetes will benefit from a class of medications known as ACE inhibitors and statins.  Unless there are specific reasons not to be on these medications, the standard of care is to consider getting one from these groups of medications at an optimal doses.  These medications are generally considered safe and proven to help protect the heart and the kidneys.    - People with diabetes are encouraged to initiate and maintain regular follow-up with eye doctors, foot doctors, dentists , and if necessary heart and kidney doctors.     - It is highly recommended that people with diabetes quit smoking or stay away from smoking, and get yearly  flu vaccine and pneumonia vaccine at least every 5 years.  See above for additional recommendations on exercise, sleep, stress management , and healthy social connections.      

## 2021-11-28 ENCOUNTER — Telehealth: Payer: Self-pay | Admitting: Internal Medicine

## 2021-11-28 DIAGNOSIS — I10 Essential (primary) hypertension: Secondary | ICD-10-CM

## 2021-11-28 MED ORDER — LISINOPRIL-HYDROCHLOROTHIAZIDE 20-25 MG PO TABS: 1.0000 | ORAL_TABLET | Freq: Every day | ORAL | 0 refills | Status: DC

## 2021-11-28 NOTE — Telephone Encounter (Signed)
*  STAT* If patient is at the pharmacy, call can be transferred to refill team.   1. Which medications need to be refilled? (please list name of each medication and dose if known)   lisinopril-hydrochlorothiazide (ZESTORETIC) 20-25 MG tablet    2. Which pharmacy/location (including street and city if local pharmacy) is medication to be sent to? [place], [place] [address]  3. Do they need a 30 day or 90 day supply?  30 day

## 2021-11-28 NOTE — Telephone Encounter (Signed)
Refill completed.

## 2021-12-01 ENCOUNTER — Ambulatory Visit: Payer: Medicaid Other | Admitting: Family Medicine

## 2021-12-05 ENCOUNTER — Encounter (HOSPITAL_COMMUNITY): Admission: RE | Admit: 2021-12-05 | Payer: Medicaid Other | Source: Ambulatory Visit

## 2021-12-09 ENCOUNTER — Telehealth: Payer: Self-pay | Admitting: "Endocrinology

## 2021-12-09 DIAGNOSIS — Z794 Long term (current) use of insulin: Secondary | ICD-10-CM

## 2021-12-09 MED ORDER — ACCU-CHEK GUIDE VI STRP: ORAL_STRIP | 2 refills | Status: DC

## 2021-12-09 MED ORDER — ACCU-CHEK GUIDE ME W/DEVICE KIT: 1.0000 | PACK | 0 refills | Status: DC

## 2021-12-09 MED ORDER — ACCU-CHEK SOFTCLIX LANCETS MISC: 1.0000 | Freq: Two times a day (BID) | 2 refills | Status: AC

## 2021-12-09 NOTE — Telephone Encounter (Signed)
[place] and is in need of a new Accu Chek Guide with the supplies. Please send to [facility]

## 2021-12-09 NOTE — Telephone Encounter (Signed)
Rx sent for Accu Chek Guide meter and supplies sent to [facility].

## 2021-12-19 ENCOUNTER — Ambulatory Visit (INDEPENDENT_AMBULATORY_CARE_PROVIDER_SITE_OTHER): Payer: Medicaid Other | Admitting: Family Medicine

## 2021-12-19 ENCOUNTER — Encounter: Payer: Self-pay | Admitting: Family Medicine

## 2021-12-19 VITALS — BP 148/82 | HR 78 | Ht 71.0 in | Wt 235.0 lb

## 2021-12-19 DIAGNOSIS — R1084 Generalized abdominal pain: Secondary | ICD-10-CM | POA: Diagnosis not present

## 2021-12-19 DIAGNOSIS — K219 Gastro-esophageal reflux disease without esophagitis: Secondary | ICD-10-CM | POA: Diagnosis not present

## 2021-12-19 DIAGNOSIS — I10 Essential (primary) hypertension: Secondary | ICD-10-CM | POA: Diagnosis not present

## 2021-12-19 DIAGNOSIS — E559 Vitamin D deficiency, unspecified: Secondary | ICD-10-CM

## 2021-12-19 LAB — POCT URINALYSIS DIP (CLINITEK)
Bilirubin, UA: NEGATIVE
Blood, UA: NEGATIVE
Glucose, UA: NEGATIVE mg/dL
Ketones, POC UA: NEGATIVE mg/dL
Leukocytes, UA: NEGATIVE
Nitrite, UA: NEGATIVE
POC PROTEIN,UA: NEGATIVE
Spec Grav, UA: 1.015 (ref 1.010–1.025)
Urobilinogen, UA: 4 E.U./dL — AB
pH, UA: 7 (ref 5.0–8.0)

## 2021-12-19 MED ORDER — PANTOPRAZOLE SODIUM 40 MG PO TBEC: 40.0000 mg | DELAYED_RELEASE_TABLET | Freq: Every day | ORAL | 3 refills | Status: DC

## 2021-12-19 NOTE — Patient Instructions (Signed)
I appreciate the opportunity to provide care to you today!    Follow up:  2 weeks  Labs: please stop by the lab during the week to get your blood drawn (CBC, CMP, TSH, Lipid profile, Vit D)  Please pick up your medication at the pharmacy     Please continue to a heart-healthy diet and increase your physical activities. Try to exercise for 62mns at least three times a week.      It was a pleasure to see you and I look forward to continuing to work together on your health and well-being. Please do not hesitate to call the office if you need care or have questions about your care.   Have a wonderful day and week. With Gratitude, GAlvira MondayMSN, FNP-BC

## 2021-12-19 NOTE — Progress Notes (Unsigned)
Established Patient Office Visit  Subjective:  Patient ID: Raymond Malone, male    DOB: 04-03-1976  Age: 46 y.o. MRN: [id number]  CC:  Chief Complaint  Patient presents with   Follow-up    Pt following up, c/o side dull pain more some uncomfort on his right side onset of this began a week ago, c/o frequent urination at night time.     HPI Raymond Malone is a 46 y.o. male with past medical history of HTN presents for f/u of chronic medical conditions. HTN: uncontrolled. Reports not taking his BP medication today. Reports a high sodium diet and denies occipital headaches, dizziness, and blurred vision. Urinary frequency: onset of symptoms 2 weeks ago. Reports urgency and frequency with increased urination at nighttime. Denies burning and pain with urination. No fever or chills were reported. Denies slow urinary stream, straining to void, urinary intermittency, and dribbling. C/o of dull pain on his right side that doesn't radiate. Denies any nausea, vomiting, or changes or bowel and bladder.  Past Medical History:  Diagnosis Date   Abscess, axilla 10/06/2016   Alcohol dependence ([place]) 10/06/2016   Dyshidrotic eczema 10/27/2016   GERD (gastroesophageal reflux disease)    Hyperlipidemia    Hyperosmolar non-ketotic state in patient with type 2 diabetes mellitus (Raymond Malone) 10/06/2016   Hypertension    MRSA (methicillin resistant Staphylococcus aureus)    Skin abscess    Type 2 diabetes mellitus (Raymond Malone)     Past Surgical History:  Procedure Laterality Date   ANKLE SURGERY     HAND SURGERY     boxer's fracture L hand   INCISION AND DRAINAGE PERIRECTAL ABSCESS     MYRINGOTOMY WITH TUBE PLACEMENT Bilateral 07/22/2020   Procedure: BILATERAL MYRINGOTOMY WITH TUBE PLACEMENT;  Surgeon: Leta Baptist, MD;  Location: [facility];  Service: ENT;  Laterality: Bilateral;   SVT ABLATION N/A 04/16/2021   Procedure: SVT ABLATION;  Surgeon: Evans Lance, MD;  Location: [facility];  Service:  Cardiovascular;  Laterality: N/A;    Family History  Problem Relation Age of Onset   Hypertension Mother     Social History   Socioeconomic History   Marital status: Significant Other    Spouse name: Shameka   Number of children: 7   Years of education: 14   Highest education level: Not on file  Occupational History   Occupation: factory work, sorting  Tobacco Use   Smoking status: Former    Packs/day: 1.00    Types: Cigarettes    Start date: 05/11/1994   Smokeless tobacco: Never  Vaping Use   Vaping Use: Every day  Substance and Sexual Activity   Alcohol use: Not Currently    Comment: Occasional   Drug use: Not Currently    Types: Marijuana    Comment: last=yesterday   Sexual activity: Yes    Birth control/protection: Condom  Other Topics Concern   Not on file  Social History Narrative   Lives with fiancee, mother, and 7 children, 3 are biological his, 2 are hers and 1 nephew   No pets in the home      Enjoys: spending time with the kids      Diet: Eats all food groups, does not have a true focus for diabetes   Caffeine: Drinks about 1 or 2 sodas a week overall has reduced his caffeine intake sometimes some tea   Water: Reports taking 6-8 times a day for more  Wears a seatbelt, does not use his phone or driving   Smoke detectors at home   Does not have fire extinguisher at this time   No weapons in the home   Social Determinants of Health   Financial Resource Strain: Low Risk  (02/09/2020)   Overall Financial Resource Strain (CARDIA)    Difficulty of Paying Living Expenses: Not hard at all  Food Insecurity: No Food Insecurity (02/09/2020)   Hunger Vital Sign    Worried About Running Out of Food in the Last Year: Never true    [place] in the Last Year: Never true  Transportation Needs: No Transportation Needs (02/09/2020)   PRAPARE - Hydrologist (Medical): No    Lack of Transportation (Non-Medical): No  Physical  Activity: Inactive (02/09/2020)   Exercise Vital Sign    Days of Exercise per Week: 0 days    Minutes of Exercise per Session: 0 min  Stress: Stress Concern Present (02/09/2020)   [place]    Feeling of Stress : To some extent  Social Connections: Moderately Isolated (02/09/2020)   Social Connection and Isolation Panel [NHANES]    Frequency of Communication with Friends and Family: More than three times a week    Frequency of Social Gatherings with Friends and Family: More than three times a week    Attends Religious Services: Never    Marine scientist or Organizations: No    Attends Archivist Meetings: Never    Marital Status: Living with partner  Intimate Partner Violence: Not At Risk (02/09/2020)   Humiliation, Afraid, Rape, and Kick questionnaire    Fear of Current or Ex-Partner: No    Emotionally Abused: No    Physically Abused: No    Sexually Abused: No    Outpatient Medications Prior to Visit  Medication Sig Dispense Refill   Accu-Chek Softclix Lancets lancets 1 each by Other route 2 (two) times daily. Use as instructed 100 each 2   blood glucose meter kit and supplies Dispense based on patient and insurance preference. Use up to four times daily as directed. (FOR ICD-10 E10.9, E11.9). 1 each 0   Blood Glucose Monitoring Suppl (ACCU-CHEK GUIDE ME) w/Device KIT 1 Piece by Does not apply route as directed. 1 kit 0   glipiZIDE (GLUCOTROL) 5 MG tablet Take 1 tablet (5 mg total) by mouth daily before breakfast. 90 tablet 1   glucose blood (ACCU-CHEK GUIDE) test strip Use to test glucose 2 times a day. 200 each 2   Insulin Pen Needle (PEN NEEDLES) 31G X 5 MM MISC 1 each by Does not apply route in the morning, at noon, in the evening, and at bedtime. 200 each 3   lisinopril-hydrochlorothiazide (ZESTORETIC) 20-25 MG tablet Take 1 tablet by mouth daily. 30 tablet 0   metFORMIN (GLUCOPHAGE) 1000 MG tablet  Take 1 tablet (1,000 mg total) by mouth 2 (two) times daily with a meal. 180 tablet 0   polyethylene glycol-electrolytes (TRILYTE) 420 g solution Take 4,000 mLs by mouth as directed. 4000 mL 0   simvastatin (ZOCOR) 40 MG tablet Take 1 tablet by mouth in the evening 90 tablet 1   pantoprazole (PROTONIX) 20 MG tablet Take 1 tablet (20 mg total) by mouth daily. 90 tablet 1   No facility-administered medications prior to visit.    No Known Allergies  ROS Review of Systems  Constitutional:  Negative for activity change,  appetite change, chills and fever.  Eyes:  Negative for visual disturbance.  Cardiovascular:  Negative for chest pain and palpitations.  Gastrointestinal:  Positive for abdominal pain. Negative for abdominal distention, anal bleeding, blood in stool, constipation, diarrhea, nausea, rectal pain and vomiting.  Endocrine: Negative for polydipsia, polyphagia and polyuria.  Genitourinary:  Positive for frequency and urgency. Negative for decreased urine volume, hematuria, penile discharge, penile pain, penile swelling and testicular pain.  Musculoskeletal:  Negative for back pain.      Objective:    Physical Exam Eyes:     Extraocular Movements: Extraocular movements intact.     Pupils: Pupils are equal, round, and reactive to light.  Cardiovascular:     Rate and Rhythm: Normal rate and regular rhythm.     Pulses: Normal pulses.     Heart sounds: Normal heart sounds.  Pulmonary:     Effort: Pulmonary effort is normal.     Breath sounds: Normal breath sounds.  Abdominal:     Palpations: Abdomen is soft.     Tenderness: There is no right CVA tenderness, left CVA tenderness, guarding or rebound.  Neurological:     Mental Status: He is alert.     BP (!) 148/82 (BP Location: Left Arm)   Pulse 78   Ht $R'5\' 11"'tE$  (1.803 m)   Wt 235 lb (106.6 kg)   SpO2 95%   BMI 32.78 kg/m  Wt Readings from Last 3 Encounters:  12/19/21 235 lb (106.6 kg)  11/25/21 233 lb 3.2 oz (105.8  kg)  09/22/21 232 lb 12.8 oz (105.6 kg)    Lab Results  Component Value Date   TSH 1.130 11/21/2021   Lab Results  Component Value Date   WBC 7.2 04/15/2021   HGB 15.6 04/15/2021   HCT 44.9 04/15/2021   MCV 87.7 04/15/2021   PLT 340 04/15/2021   Lab Results  Component Value Date   NA 137 11/21/2021   K 3.9 11/21/2021   CO2 22 11/21/2021   GLUCOSE 124 (H) 11/21/2021   BUN 9 11/21/2021   CREATININE 0.83 11/21/2021   BILITOT 0.7 11/21/2021   ALKPHOS 88 11/21/2021   AST 12 11/21/2021   ALT 22 11/21/2021   PROT 7.1 11/21/2021   ALBUMIN 4.7 11/21/2021   CALCIUM 10.0 11/21/2021   ANIONGAP 7 04/15/2021   EGFR 110 11/21/2021   Lab Results  Component Value Date   CHOL 126 11/21/2021   Lab Results  Component Value Date   HDL 40 11/21/2021   Lab Results  Component Value Date   LDLCALC 68 11/21/2021   Lab Results  Component Value Date   TRIG 93 11/21/2021   Lab Results  Component Value Date   CHOLHDL 3.2 11/21/2021   Lab Results  Component Value Date   HGBA1C 6.7 11/25/2021      Assessment & Plan:   Problem List Items Addressed This Visit       Cardiovascular and Mediastinum   Essential hypertension, benign    Uncontrolled He reports not taking his BP medication today Reports a high sodium diet and denies occipital headaches, dizziness, and blurred vision Encouraged to limit his daily sodium intake to '1500mg'$  Dash diet reviewed No changes to the medication regimen, given that BP meds were not taken today F/u in 2 weeks      Relevant Orders   CBC with Differential/Platelet   CMP14+EGFR   TSH + free T4   Lipid Profile     Digestive   GERD (gastroesophageal  reflux disease)    UA negative for leuk and nitrates   C/o of dull pain on his right side that doesn't radiate Denies any nausea, vomiting, or changes or bowel and bladder Will Increase his Protonix and reevaluate  Educated on Lifestyle changes for GERD: Avoid certain foods and drinks, such  as coffee, chocolate, onions, peppermint, spicy foods, carbonated beverages, citrus fruits, tomatoes, onions, Garlic, alcohol, Fatty foods (bacon, burgers, sausages, steak, fried foods, dairy food)   Recommended: High fiber foods, whole grain cereal, oatmeal, brown rice, root vegetables, non- citrus fruits, High protein foods, Health fats (avocados, olive oil, nuts and seeds)          Relevant Medications   pantoprazole (PROTONIX) 40 MG tablet   Other Visit Diagnoses     Generalized abdominal pain    -  Primary   Relevant Orders   POCT URINALYSIS DIP (CLINITEK) (Completed)   Vitamin D deficiency       Relevant Orders   Vitamin D (25 hydroxy)       Meds ordered this encounter  Medications   pantoprazole (PROTONIX) 40 MG tablet    Sig: Take 1 tablet (40 mg total) by mouth daily.    Dispense:  30 tablet    Refill:  3    Follow-up: Return in about 2 weeks (around 01/02/2022) for BP.    Alvira Monday, FNP

## 2021-12-21 NOTE — Assessment & Plan Note (Signed)
UA negative for leuk and nitrates   C/o of dull pain on his right side that doesn't radiate Denies any nausea, vomiting, or changes or bowel and bladder Will Increase his Protonix and reevaluate  Educated on Lifestyle changes for GERD: . Avoid certain foods and drinks, such as coffee, chocolate, onions, peppermint, spicy foods, carbonated beverages, citrus fruits, tomatoes, onions, Garlic, alcohol, Fatty foods (bacon, burgers, sausages, steak, fried foods, dairy food)   . Recommended: High fiber foods, whole grain cereal, oatmeal, brown rice, root vegetables, non- citrus fruits, High protein foods, Health fats (avocados, olive oil, nuts and seeds)

## 2021-12-21 NOTE — Assessment & Plan Note (Signed)
Uncontrolled He reports not taking his BP medication today Reports a high sodium diet and denies occipital headaches, dizziness, and blurred vision Encouraged to limit his daily sodium intake to '1500mg'$  Dash diet reviewed No changes to the medication regimen, given that BP meds were not taken today F/u in 2 weeks

## 2021-12-24 ENCOUNTER — Other Ambulatory Visit: Payer: Self-pay | Admitting: "Endocrinology

## 2021-12-24 DIAGNOSIS — E785 Hyperlipidemia, unspecified: Secondary | ICD-10-CM

## 2021-12-24 DIAGNOSIS — E119 Type 2 diabetes mellitus without complications: Secondary | ICD-10-CM

## 2022-01-01 NOTE — Progress Notes (Signed)
   01/01/22 1317  OBSTRUCTIVE SLEEP APNEA  Have you ever been diagnosed with sleep apnea through a sleep study? No  Do you snore loudly (loud enough to be heard through closed doors)?  1  Do you often feel tired, fatigued, or sleepy during the daytime (such as falling asleep during driving or talking to someone)? 1  Has anyone observed you stop breathing during your sleep? 1  Do you have, or are you being treated for high blood pressure? 1  BMI more than 35 kg/m2? 0  Age > 50 (1-yes) 0  Neck circumference greater than:Male 16 inches or larger, Male 17inches or larger? 0  Male Gender (Yes=1) 1  Obstructive Sleep Apnea Score 5

## 2022-01-02 ENCOUNTER — Telehealth (INDEPENDENT_AMBULATORY_CARE_PROVIDER_SITE_OTHER): Payer: Self-pay

## 2022-01-02 ENCOUNTER — Encounter (HOSPITAL_COMMUNITY)
Admission: RE | Admit: 2022-01-02 | Discharge: 2022-01-02 | Disposition: A | Discharge status: Home / Self Care | Payer: Medicaid Other | Source: Ambulatory Visit | Attending: Gastroenterology | Admitting: Gastroenterology

## 2022-01-02 MED ORDER — PEG 3350-KCL-NA BICARB-NACL 420 G PO SOLR: 4000.0000 mL | ORAL | 0 refills | Status: DC

## 2022-01-02 NOTE — Telephone Encounter (Signed)
Raymond Malone Ann Rorey Hodges, CMA  ?

## 2022-01-06 ENCOUNTER — Telehealth (INDEPENDENT_AMBULATORY_CARE_PROVIDER_SITE_OTHER): Payer: Self-pay

## 2022-01-06 ENCOUNTER — Ambulatory Visit (HOSPITAL_BASED_OUTPATIENT_CLINIC_OR_DEPARTMENT_OTHER): Payer: Medicaid Other | Admitting: Certified Registered Nurse Anesthetist

## 2022-01-06 ENCOUNTER — Encounter (HOSPITAL_COMMUNITY): Payer: Self-pay | Admitting: Gastroenterology

## 2022-01-06 ENCOUNTER — Ambulatory Visit (HOSPITAL_COMMUNITY): Payer: Medicaid Other | Admitting: Certified Registered Nurse Anesthetist

## 2022-01-06 ENCOUNTER — Encounter (INDEPENDENT_AMBULATORY_CARE_PROVIDER_SITE_OTHER): Payer: Self-pay

## 2022-01-06 ENCOUNTER — Other Ambulatory Visit (INDEPENDENT_AMBULATORY_CARE_PROVIDER_SITE_OTHER): Payer: Self-pay

## 2022-01-06 ENCOUNTER — Other Ambulatory Visit: Payer: Self-pay

## 2022-01-06 ENCOUNTER — Encounter (HOSPITAL_COMMUNITY)
Admission: RE | Disposition: A | Discharge status: Home / Self Care | Payer: Self-pay | Source: Home / Self Care | Attending: Gastroenterology

## 2022-01-06 ENCOUNTER — Ambulatory Visit (HOSPITAL_COMMUNITY)
Admission: RE | Admit: 2022-01-06 | Discharge: 2022-01-06 | Disposition: A | Discharge status: Home / Self Care | Payer: Medicaid Other | Attending: Gastroenterology | Admitting: Gastroenterology

## 2022-01-06 DIAGNOSIS — C20 Malignant neoplasm of rectum: Secondary | ICD-10-CM | POA: Diagnosis not present

## 2022-01-06 DIAGNOSIS — K5730 Diverticulosis of large intestine without perforation or abscess without bleeding: Secondary | ICD-10-CM

## 2022-01-06 DIAGNOSIS — E785 Hyperlipidemia, unspecified: Secondary | ICD-10-CM | POA: Diagnosis not present

## 2022-01-06 DIAGNOSIS — C189 Malignant neoplasm of colon, unspecified: Secondary | ICD-10-CM | POA: Insufficient documentation

## 2022-01-06 DIAGNOSIS — K635 Polyp of colon: Secondary | ICD-10-CM

## 2022-01-06 DIAGNOSIS — K219 Gastro-esophageal reflux disease without esophagitis: Secondary | ICD-10-CM | POA: Insufficient documentation

## 2022-01-06 DIAGNOSIS — Z1211 Encounter for screening for malignant neoplasm of colon: Secondary | ICD-10-CM

## 2022-01-06 DIAGNOSIS — E119 Type 2 diabetes mellitus without complications: Secondary | ICD-10-CM | POA: Insufficient documentation

## 2022-01-06 DIAGNOSIS — Z7984 Long term (current) use of oral hypoglycemic drugs: Secondary | ICD-10-CM | POA: Insufficient documentation

## 2022-01-06 DIAGNOSIS — D122 Benign neoplasm of ascending colon: Secondary | ICD-10-CM | POA: Insufficient documentation

## 2022-01-06 DIAGNOSIS — Z87891 Personal history of nicotine dependence: Secondary | ICD-10-CM | POA: Insufficient documentation

## 2022-01-06 DIAGNOSIS — I10 Essential (primary) hypertension: Secondary | ICD-10-CM | POA: Insufficient documentation

## 2022-01-06 DIAGNOSIS — K5790 Diverticulosis of intestine, part unspecified, without perforation or abscess without bleeding: Secondary | ICD-10-CM

## 2022-01-06 HISTORY — PX: COLONOSCOPY WITH PROPOFOL: SHX5780

## 2022-01-06 HISTORY — PX: BIOPSY: SHX5522

## 2022-01-06 HISTORY — PX: POLYPECTOMY: SHX149

## 2022-01-06 LAB — CBC
HCT: 42.4 % (ref 39.0–52.0)
Hemoglobin: 14.7 g/dL (ref 13.0–17.0)
MCH: 29.5 pg (ref 26.0–34.0)
MCHC: 34.7 g/dL (ref 30.0–36.0)
MCV: 85.1 fL (ref 80.0–100.0)
Platelets: 351 10*3/uL (ref 150–400)
RBC: 4.98 MIL/uL (ref 4.22–5.81)
RDW: 11.9 % (ref 11.5–15.5)
WBC: 6.9 10*3/uL (ref 4.0–10.5)
nRBC: 0 % (ref 0.0–0.2)

## 2022-01-06 LAB — HM COLONOSCOPY

## 2022-01-06 LAB — GLUCOSE, CAPILLARY: Glucose-Capillary: 99 mg/dL (ref 70–99)

## 2022-01-06 SURGERY — COLONOSCOPY WITH PROPOFOL
Anesthesia: General

## 2022-01-06 MED ORDER — PHENYLEPHRINE 80 MCG/ML (10ML) SYRINGE FOR IV PUSH (FOR BLOOD PRESSURE SUPPORT)
PREFILLED_SYRINGE | INTRAVENOUS | Status: DC | PRN
  Administered 2022-01-06 (×2): 160 ug via INTRAVENOUS

## 2022-01-06 MED ORDER — SPOT INK MARKER SYRINGE KIT
PACK | SUBMUCOSAL | Status: DC | PRN
  Administered 2022-01-06: 2 mL via SUBMUCOSAL

## 2022-01-06 MED ORDER — LACTATED RINGERS IV SOLN: INTRAVENOUS | Status: DC

## 2022-01-06 MED ORDER — PROPOFOL 10 MG/ML IV BOLUS
INTRAVENOUS | Status: DC | PRN
  Administered 2022-01-06: 40 mg via INTRAVENOUS

## 2022-01-06 MED ORDER — PROPOFOL 500 MG/50ML IV EMUL
INTRAVENOUS | Status: DC | PRN
  Administered 2022-01-06: 150 ug/kg/min via INTRAVENOUS

## 2022-01-06 NOTE — Telephone Encounter (Signed)
CT scan is scheduled on Thursday 01/08/22 at 1:30 pm and MRI is scheduled on 01/10/22 both at [facility] and patient is aware

## 2022-01-06 NOTE — Discharge Instructions (Signed)
You are being discharged to home.  Resume your previous diet.  We are waiting for your pathology results.  Schedule CT chest/abdomen/pelvis with IV contrast.

## 2022-01-06 NOTE — Telephone Encounter (Signed)
Thanks

## 2022-01-06 NOTE — Telephone Encounter (Signed)
-----   Message from Harvel Quale, MD sent at 01/06/2022 11:43 AM EDT ----- Raymond Malone,   Can you please schedule a CT chest/abdomen/pelvis with IV contrast and MR pelvis with IV contrast ASAP? Dx: colon cancer  Thanks,  Maylon Peppers, MD Gastroenterology and Hepatology [facility]

## 2022-01-06 NOTE — Transfer of Care (Signed)
Immediate Anesthesia Transfer of Care Note  Patient: Raymond Malone  Procedure(s) Performed: COLONOSCOPY WITH PROPOFOL POLYPECTOMY INTESTINAL BIOPSY  Patient Location: Endoscopy Unit  Anesthesia Type:MAC  Level of Consciousness: drowsy and patient cooperative  Airway & Oxygen Therapy: Patient Spontanous Breathing and Patient connected to face mask oxygen  Post-op Assessment: Report given to RN and Post -op Vital signs reviewed and stable  Post vital signs: Reviewed and stable  Last Vitals:  Vitals Value Taken Time  BP 100/60   Temp    Pulse 74   Resp 14   SpO2 99%     Last Pain:  Vitals:   01/06/22 1053  TempSrc:   PainSc: 0-No pain      Patients Stated Pain Goal: 6 (22/02/54 2706)  Complications: No notable events documented.

## 2022-01-06 NOTE — Op Note (Addendum)
[facility] Patient Name: Raymond Malone Procedure Date: 01/06/2022 10:38 AM MRN: [id number] Date of Birth: 02/06/1976 Attending MD: Maylon Peppers ,  CSN: [id number] Age: 46 Admit Type: Outpatient Procedure:                Colonoscopy Indications:              Screening for colorectal malignant neoplasm Providers:                Maylon Peppers, Crystal Page, Rosina Lowenstein, RN Referring MD:              Medicines:                Monitored Anesthesia Care Complications:            No immediate complications. Estimated Blood Loss:     Estimated blood loss: none. Procedure:                Pre-Anesthesia Assessment:                           - Prior to the procedure, a History and Physical                            was performed, and patient medications, allergies                            and sensitivities were reviewed. The patient's                            tolerance of previous anesthesia was reviewed.                           - The risks and benefits of the procedure and the                            sedation options and risks were discussed with the                            patient. All questions were answered and informed                            consent was obtained.                           - ASA Grade Assessment: II - A patient with mild                            systemic disease.                           After obtaining informed consent, the colonoscope                            was passed under direct vision. Throughout the                            procedure, the patient's blood pressure,  pulse, and                            oxygen saturations were monitored continuously. The                            [id number]-[id number] ([id number]) scope was introduced through                            the anus and advanced to the the cecum, identified                            by appendiceal orifice and ileocecal valve. The                            colonoscopy was performed  without difficulty. The                            patient tolerated the procedure well. The quality                            of the bowel preparation was good. Scope In: 10:55:26 AM Scope Out: 11:26:49 AM Scope Withdrawal Time: 0 hours 26 minutes 28 seconds  Total Procedure Duration: 0 hours 31 minutes 23 seconds  Findings:      The perianal and digital rectal examinations were normal.      Two sessile polyps were found in the descending colon and ascending       colon. The polyps were 3 to 5 mm in size. These polyps were removed with       a cold snare. Resection and retrieval were complete.      A few small-mouthed diverticula were found in the descending colon and       ascending colon.      An ulcerated non-obstructing medium-sized mass was found at 8 cm       proximal to the anus, extending to 11 cm proximal. The mass was       non-circumferential. The mass measured three cm in length. In addition,       its diameter measured two mm. No bleeding was present. This was biopsied       with a cold large-capacity forceps for histology. Area just distal to       the mass was successfully injected with 2 mL Spot (carbon black) for       tattooing.      The retroflexed view of the distal rectum and anal verge was normal and       showed no anal or rectal abnormalities. Impression:               - Two 3 to 5 mm polyps in the descending colon and                            in the ascending colon, removed with a cold snare.                            Resected and retrieved.                           -  Diverticulosis in the descending colon and in the                            ascending colon.                           - Likely malignant tumor at 8 cm proximal to the                            anus. Biopsied. Injected.                           - The distal rectum and anal verge are normal on                            retroflexion view. Moderate Sedation:      Per Anesthesia  Care Recommendation:           - Discharge patient to home (ambulatory).                           - Resume previous diet.                           - Await pathology results.                           - Check CBC and CEA.                           - Schedule CT chest/abdomen/pelvis with IV contrast                            and MR pelvis with IV contrast. Procedure Code(s):        --- Professional ---                           [phone], Colonoscopy, flexible; with removal of                            tumor(s), polyp(s), or other lesion(s) by snare                            technique                           [id number], Colonoscopy, flexible; with directed                            submucosal injection(s), any substance                           42595, 59, Colonoscopy, flexible; with biopsy,                            single or multiple Diagnosis Code(s):        --- Professional ---  Z12.11, Encounter for screening for malignant                            neoplasm of colon                           K63.5, Polyp of colon                           D49.0, Neoplasm of unspecified behavior of                            digestive system                           K57.30, Diverticulosis of large intestine without                            perforation or abscess without bleeding CPT copyright 2019 American Medical Association. All rights reserved. The codes documented in this report are preliminary and upon coder review may  be revised to meet current compliance requirements. Maylon Peppers, MD Maylon Peppers,  01/06/2022 11:39:47 AM This report has been signed electronically. Number of Addenda: 0

## 2022-01-06 NOTE — H&P (Signed)
Raymond Malone is an 46 y.o. male.   Chief Complaint: screening colonoscopy HPI: 46 y/o M with PMH GERD, diabetes, hyperlipidemia, hypertension, coming for screening colonoscopy. The patient has never had a colonoscopy in the past.  The patient denies having any complaints such as melena, hematochezia, abdominal pain or distention, change in her bowel movement consistency or frequency, no changes in weight recently.  No family history of colorectal cancer.   Past Medical History:  Diagnosis Date   Abscess, axilla 10/06/2016   Alcohol dependence ([place]) 10/06/2016   Dyshidrotic eczema 10/27/2016   GERD (gastroesophageal reflux disease)    Hyperlipidemia    Hyperosmolar non-ketotic state in patient with type 2 diabetes mellitus ([place]) 10/06/2016   Hypertension    MRSA (methicillin resistant Staphylococcus aureus)    Skin abscess    Type 2 diabetes mellitus ([place])     Past Surgical History:  Procedure Laterality Date   ANKLE SURGERY     HAND SURGERY     boxer's fracture L hand   INCISION AND DRAINAGE PERIRECTAL ABSCESS     MYRINGOTOMY WITH TUBE PLACEMENT Bilateral 07/22/2020   Procedure: BILATERAL MYRINGOTOMY WITH TUBE PLACEMENT;  Surgeon: Leta Baptist, MD;  Location: [place];  Service: ENT;  Laterality: Bilateral;   SVT ABLATION N/A 04/16/2021   Procedure: SVT ABLATION;  Surgeon: Evans Lance, MD;  Location: [facility];  Service: Cardiovascular;  Laterality: N/A;    Family History  Problem Relation Age of Onset   Hypertension Mother    Social History:  reports that he has quit smoking. His smoking use included cigarettes. He started smoking about 27 years ago. He smoked an average of 1 pack per day. He has never used smokeless tobacco. He reports that he does not currently use alcohol. He reports that he does not currently use drugs after having used the following drugs: Marijuana.  Allergies: No Known Allergies  Medications Prior to Admission  Medication Sig  Dispense Refill   glipiZIDE (GLUCOTROL) 5 MG tablet Take 1 tablet (5 mg total) by mouth daily before breakfast. 90 tablet 1   lisinopril-hydrochlorothiazide (ZESTORETIC) 20-25 MG tablet Take 1 tablet by mouth daily. 30 tablet 0   metFORMIN (GLUCOPHAGE) 1000 MG tablet Take 1 tablet (1,000 mg total) by mouth 2 (two) times daily with a meal. 180 tablet 0   pantoprazole (PROTONIX) 40 MG tablet Take 1 tablet (40 mg total) by mouth daily. 30 tablet 3   polyethylene glycol-electrolytes (TRILYTE) 420 g solution Take 4,000 mLs by mouth as directed. 4000 mL 0   simvastatin (ZOCOR) 40 MG tablet Take 1 tablet by mouth in the evening 90 tablet 0   Accu-Chek Softclix Lancets lancets 1 each by Other route 2 (two) times daily. Use as instructed 100 each 2   blood glucose meter kit and supplies Dispense based on patient and insurance preference. Use up to four times daily as directed. (FOR ICD-10 E10.9, E11.9). 1 each 0   Blood Glucose Monitoring Suppl (ACCU-CHEK GUIDE ME) w/Device KIT 1 Piece by Does not apply route as directed. 1 kit 0   glucose blood (ACCU-CHEK GUIDE) test strip Use to test glucose 2 times a day. 200 each 2   Insulin Pen Needle (PEN NEEDLES) 31G X 5 MM MISC 1 each by Does not apply route in the morning, at noon, in the evening, and at bedtime. 200 each 3    Results for orders placed or performed during the hospital encounter of 01/06/22 (from  the past 48 hour(s))  Glucose, capillary     Status: None   Collection Time: 01/06/22 10:04 AM  Result Value Ref Range   Glucose-Capillary 99 70 - 99 mg/dL    Comment: Glucose reference range applies only to samples taken after fasting for at least 8 hours.   No results found.  Review of Systems  All other systems reviewed and are negative.   Blood pressure 116/78, pulse 68, temperature 98 F (36.7 C), temperature source Oral, resp. rate 19, height _0  (1.803 m), weight 106.6 kg, SpO2 96 %. Physical Exam  GENERAL: The patient is AO x3, in no  acute distress. HEENT: Head is normocephalic and atraumatic. EOMI are intact. Mouth is well hydrated and without lesions. NECK: Supple. No masses LUNGS: Clear to auscultation. No presence of rhonchi/wheezing/rales. Adequate chest expansion HEART: RRR, normal s1 and s2. ABDOMEN: Soft, nontender, no guarding, no peritoneal signs, and nondistended. BS +. No masses. EXTREMITIES: Without any cyanosis, clubbing, rash, lesions or edema. NEUROLOGIC: AOx3, no focal motor deficit. SKIN: no jaundice, no rashes  Assessment/Plan 46 y/o M with PMH GERD, diabetes, hyperlipidemia, hypertension, coming for screening colonoscopy. The patient is at average risk for colorectal cancer.  We will proceed with colonoscopy today.   Harvel Quale, MD 01/06/2022, 10:51 AM

## 2022-01-06 NOTE — Anesthesia Preprocedure Evaluation (Signed)
Anesthesia Evaluation  Patient identified by MRN, date of birth, ID band Patient awake    Reviewed: Allergy & Precautions, H&P , NPO status , Patient's Chart, lab work & pertinent test results, reviewed documented beta blocker date and time   Airway Mallampati: II  TM Distance: >3 FB Neck ROM: full    Dental no notable dental hx.    Pulmonary neg pulmonary ROS, former smoker,    Pulmonary exam normal breath sounds clear to auscultation       Cardiovascular Exercise Tolerance: Good hypertension, negative cardio ROS   Rhythm:regular Rate:Normal     Neuro/Psych negative neurological ROS  negative psych ROS   GI/Hepatic Neg liver ROS, GERD  Medicated,  Endo/Other  negative endocrine ROSdiabetes, Type 2  Renal/GU negative Renal ROS  negative genitourinary   Musculoskeletal   Abdominal   Peds  Hematology negative hematology ROS (+)   Anesthesia Other Findings   Reproductive/Obstetrics negative OB ROS                             Anesthesia Physical Anesthesia Plan  ASA: 2  Anesthesia Plan: General   Post-op Pain Management:    Induction:   PONV Risk Score and Plan: Propofol infusion  Airway Management Planned:   Additional Equipment:   Intra-op Plan:   Post-operative Plan:   Informed Consent: I have reviewed the patients History and Physical, chart, labs and discussed the procedure including the risks, benefits and alternatives for the proposed anesthesia with the patient or authorized representative who has indicated his/her understanding and acceptance.     Dental Advisory Given  Plan Discussed with: CRNA  Anesthesia Plan Comments:         Anesthesia Quick Evaluation

## 2022-01-07 ENCOUNTER — Encounter (INDEPENDENT_AMBULATORY_CARE_PROVIDER_SITE_OTHER): Payer: Self-pay | Admitting: *Deleted

## 2022-01-07 ENCOUNTER — Encounter: Payer: Self-pay | Admitting: Family Medicine

## 2022-01-07 ENCOUNTER — Ambulatory Visit: Payer: Medicaid Other | Admitting: Family Medicine

## 2022-01-07 ENCOUNTER — Other Ambulatory Visit (INDEPENDENT_AMBULATORY_CARE_PROVIDER_SITE_OTHER): Payer: Self-pay | Admitting: Gastroenterology

## 2022-01-07 VITALS — BP 134/83 | HR 72 | Ht 71.0 in | Wt 230.0 lb

## 2022-01-07 DIAGNOSIS — E559 Vitamin D deficiency, unspecified: Secondary | ICD-10-CM

## 2022-01-07 DIAGNOSIS — R7301 Impaired fasting glucose: Secondary | ICD-10-CM | POA: Diagnosis not present

## 2022-01-07 DIAGNOSIS — C20 Malignant neoplasm of rectum: Secondary | ICD-10-CM | POA: Diagnosis not present

## 2022-01-07 DIAGNOSIS — I10 Essential (primary) hypertension: Secondary | ICD-10-CM | POA: Diagnosis not present

## 2022-01-07 DIAGNOSIS — C189 Malignant neoplasm of colon, unspecified: Secondary | ICD-10-CM

## 2022-01-07 LAB — SURGICAL PATHOLOGY

## 2022-01-07 LAB — CEA: CEA: 12 ng/mL — ABNORMAL HIGH (ref 0.0–4.7)
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
## 2022-01-07 NOTE — Patient Instructions (Signed)
I appreciate the opportunity to provide care to you today!    Follow up:  3 months CPE     Please continue to a heart-healthy diet and increase your physical activities. Try to exercise for 71mns at least three times a week.      It was a pleasure to see you and I look forward to continuing to work together on your health and well-being. Please do not hesitate to call the office if you need care or have questions about your care.   Have a wonderful day and week. With Gratitude, GAlvira MondayMSN, FNP-BC

## 2022-01-07 NOTE — Assessment & Plan Note (Signed)
Recently discovered on 01/06/22 when he had his colonoscopy A referral has been placed to oncology and patient is schedule for a CT scan on 01/08/22 and a MRI on 01/10/22

## 2022-01-07 NOTE — Assessment & Plan Note (Signed)
Controlled today Reports to have taken his medication. Denies headaches, dizziness and blurred vision Encouraged to continue antihypertensive regimen

## 2022-01-08 ENCOUNTER — Ambulatory Visit (HOSPITAL_COMMUNITY)
Admission: RE | Admit: 2022-01-08 | Discharge: 2022-01-08 | Disposition: A | Discharge status: Home / Self Care | Payer: Medicaid Other | Source: Ambulatory Visit | Attending: Gastroenterology | Admitting: Gastroenterology

## 2022-01-08 DIAGNOSIS — K5730 Diverticulosis of large intestine without perforation or abscess without bleeding: Secondary | ICD-10-CM | POA: Diagnosis not present

## 2022-01-08 DIAGNOSIS — E119 Type 2 diabetes mellitus without complications: Secondary | ICD-10-CM | POA: Insufficient documentation

## 2022-01-08 DIAGNOSIS — I700 Atherosclerosis of aorta: Secondary | ICD-10-CM | POA: Diagnosis not present

## 2022-01-08 DIAGNOSIS — C189 Malignant neoplasm of colon, unspecified: Secondary | ICD-10-CM | POA: Insufficient documentation

## 2022-01-08 DIAGNOSIS — Z794 Long term (current) use of insulin: Secondary | ICD-10-CM | POA: Diagnosis not present

## 2022-01-08 DIAGNOSIS — R590 Localized enlarged lymph nodes: Secondary | ICD-10-CM | POA: Diagnosis not present

## 2022-01-08 LAB — POCT I-STAT CREATININE: Creatinine, Ser: 1 mg/dL (ref 0.61–1.24)

## 2022-01-08 MED ORDER — SODIUM CHLORIDE (PF) 0.9 % IJ SOLN
INTRAMUSCULAR | Status: AC
  Filled 2022-01-08: qty 50

## 2022-01-08 MED ORDER — IOHEXOL 300 MG/ML  SOLN
100.0000 mL | Freq: Once | INTRAMUSCULAR | Status: AC | PRN
  Administered 2022-01-08: 100 mL via INTRAVENOUS

## 2022-01-08 NOTE — Anesthesia Postprocedure Evaluation (Signed)
Anesthesia Post Note  Patient: Raymond Malone  Procedure(s) Performed: COLONOSCOPY WITH PROPOFOL POLYPECTOMY INTESTINAL BIOPSY  Patient location during evaluation: Phase II Anesthesia Type: General Level of consciousness: awake Pain management: pain level controlled Vital Signs Assessment: post-procedure vital signs reviewed and stable Respiratory status: spontaneous breathing and respiratory function stable Cardiovascular status: blood pressure returned to baseline and stable Postop Assessment: no headache and no apparent nausea or vomiting Anesthetic complications: no Comments: Late entry   No notable events documented.   Last Vitals:  Vitals:   01/06/22 1130 01/06/22 1140  BP: 100/60 109/64  Pulse: 72   Resp: 19   Temp: 36.4 C   SpO2: 96%     Last Pain:  Vitals:   01/06/22 1130  TempSrc: Oral  PainSc: 0-No pain                 [name]

## 2022-01-09 ENCOUNTER — Encounter (HOSPITAL_COMMUNITY): Payer: Self-pay | Admitting: Gastroenterology

## 2022-01-10 ENCOUNTER — Ambulatory Visit (HOSPITAL_COMMUNITY)
Admission: RE | Admit: 2022-01-10 | Discharge: 2022-01-10 | Disposition: A | Discharge status: Home / Self Care | Payer: Medicaid Other | Source: Ambulatory Visit | Attending: Gastroenterology | Admitting: Gastroenterology

## 2022-01-10 ENCOUNTER — Encounter (HOSPITAL_COMMUNITY): Payer: Self-pay

## 2022-01-10 ENCOUNTER — Emergency Department (HOSPITAL_COMMUNITY)
Admission: EM | Admit: 2022-01-10 | Discharge: 2022-01-10 | Disposition: A | Discharge status: Home / Self Care | Payer: Medicaid Other | Attending: Emergency Medicine | Admitting: Emergency Medicine

## 2022-01-10 DIAGNOSIS — Z794 Long term (current) use of insulin: Secondary | ICD-10-CM | POA: Insufficient documentation

## 2022-01-10 DIAGNOSIS — E119 Type 2 diabetes mellitus without complications: Secondary | ICD-10-CM | POA: Diagnosis not present

## 2022-01-10 DIAGNOSIS — C189 Malignant neoplasm of colon, unspecified: Secondary | ICD-10-CM

## 2022-01-10 DIAGNOSIS — L0231 Cutaneous abscess of buttock: Secondary | ICD-10-CM | POA: Diagnosis not present

## 2022-01-10 DIAGNOSIS — C20 Malignant neoplasm of rectum: Secondary | ICD-10-CM

## 2022-01-10 DIAGNOSIS — L0291 Cutaneous abscess, unspecified: Secondary | ICD-10-CM

## 2022-01-10 MED ORDER — LIDOCAINE HCL 2 % IJ SOLN: 15.0000 mL | Freq: Once | INTRAMUSCULAR | Status: DC

## 2022-01-10 MED ORDER — IBUPROFEN 800 MG PO TABS
800.0000 mg | ORAL_TABLET | Freq: Once | ORAL | Status: AC
  Administered 2022-01-10: 800 mg via ORAL
  Filled 2022-01-10: qty 1

## 2022-01-10 MED ORDER — ACETAMINOPHEN 500 MG PO TABS: 500.0000 mg | ORAL_TABLET | Freq: Four times a day (QID) | ORAL | 0 refills | Status: DC | PRN

## 2022-01-10 MED ORDER — LIDOCAINE HCL 2 % IJ SOLN: 20.0000 mL | Freq: Once | INTRAMUSCULAR | Status: DC

## 2022-01-10 MED ORDER — LIDOCAINE HCL (PF) 1 % IJ SOLN
20.0000 mL | Freq: Once | INTRAMUSCULAR | Status: DC
  Administered 2022-01-10: 20 mL
  Filled 2022-01-10: qty 30

## 2022-01-10 MED ORDER — DOXYCYCLINE HYCLATE 100 MG PO CAPS: 100.0000 mg | ORAL_CAPSULE | Freq: Two times a day (BID) | ORAL | 0 refills | Status: DC

## 2022-01-10 MED ORDER — IBUPROFEN 800 MG PO TABS: 800.0000 mg | ORAL_TABLET | Freq: Three times a day (TID) | ORAL | 0 refills | Status: DC

## 2022-01-10 NOTE — Discharge Instructions (Addendum)
I have sent the antibiotic doxycycline to your pharmacy.  It may upset your stomach so take it with food.  Ibuprofen and Tylenol may be alternated every 3 hours.  Please return with any worsening symptoms or if the area is not clearing up by the 5-day mark.

## 2022-01-10 NOTE — ED Provider Notes (Addendum)
[facility] DEPT Provider Note   CSN: [id number] Arrival date & time: 01/10/22  1042     History  Chief Complaint  Patient presents with   Cyst    Raymond Malone is a 46 y.o. male with a past medical history of diabetes and hyperlipidemia presenting today with an abscess.  He says that for the past 4 days he has had swelling and tenderness to the left gluteal cleft.  Says he has a history of abscesses.  Denies any fevers or chills.  It has not been draining.  HPI     Home Medications Prior to Admission medications   Medication Sig Start Date End Date Taking? Authorizing Provider  Accu-Chek Softclix Lancets lancets 1 each by Other route 2 (two) times daily. Use as instructed 12/09/21   Cassandria Anger, MD  blood glucose meter kit and supplies Dispense based on patient and insurance preference. Use up to four times daily as directed. (FOR ICD-10 E10.9, E11.9). 02/10/21   Noreene Larsson, NP  Blood Glucose Monitoring Suppl (ACCU-CHEK GUIDE ME) w/Device KIT 1 Piece by Does not apply route as directed. 12/09/21   Cassandria Anger, MD  glipiZIDE (GLUCOTROL) 5 MG tablet Take 1 tablet (5 mg total) by mouth daily before breakfast. 09/22/21   Alvira Monday, FNP  glucose blood (ACCU-CHEK GUIDE) test strip Use to test glucose 2 times a day. 12/09/21   Cassandria Anger, MD  Insulin Pen Needle (PEN NEEDLES) 31G X 5 MM MISC 1 each by Does not apply route in the morning, at noon, in the evening, and at bedtime. 09/22/21   Alvira Monday, FNP  lisinopril-hydrochlorothiazide (ZESTORETIC) 20-25 MG tablet Take 1 tablet by mouth daily. 11/28/21   Satira Sark, MD  metFORMIN (GLUCOPHAGE) 1000 MG tablet Take 1 tablet (1,000 mg total) by mouth 2 (two) times daily with a meal. 09/22/21   Alvira Monday, FNP  pantoprazole (PROTONIX) 40 MG tablet Take 1 tablet (40 mg total) by mouth daily. 12/19/21   Alvira Monday, FNP  simvastatin (ZOCOR) 40 MG tablet Take 1 tablet  by mouth in the evening 12/25/21   Cassandria Anger, MD      Allergies    Patient has no known allergies.    Review of Systems   Review of Systems  Physical Exam Updated Vital Signs BP 123/78 (BP Location: Right Arm)   Pulse 71   Temp 98.2 F (36.8 C) (Oral)   Resp 16   SpO2 98%  Physical Exam Vitals and nursing note reviewed.  Constitutional:      Appearance: Normal appearance.  HENT:     Head: Normocephalic and atraumatic.  Eyes:     General: No scleral icterus.    Conjunctiva/sclera: Conjunctivae normal.  Pulmonary:     Effort: Pulmonary effort is normal. No respiratory distress.  Genitourinary:    Comments: exam performed in presence of nurse tech.  Patient with induration and a small area of fluctuance to the left gluteal cleft.  I was unable to localize the best part for I&D so bedside ultrasound was performed.  Patient with an abscess that appears to be drainable. 2-2.5cm Skin:    Findings: No rash.  Neurological:     Mental Status: He is alert.  Psychiatric:        Mood and Affect: Mood normal.     ED Results / Procedures / Treatments   Labs (all labs ordered are listed, but only abnormal results are displayed) Labs  Reviewed - No data to display  EKG None  Radiology  Procedures Ultrasound ED Soft Tissue  Date/Time: 01/10/2022 11:18 AM  Performed by: Rhae Hammock, PA-C Authorized by: Rhae Hammock, PA-C   Procedure details:    Indications: localization of abscess and evaluate for cellulitis     Transverse view:  Visualized   Longitudinal view:  Visualized   Images: not archived   Location:    Location: buttocks     Side:  Left Findings:     abscess present    cellulitis present Comments:     2 cm abscess noted to the left gluteal cleft.  Surrounding cellulitis. [name].Incision and Drainage  Date/Time: 01/10/2022 11:57 AM  Performed by: Rhae Hammock, PA-C Authorized by: Rhae Hammock, PA-C   Consent:    Consent  obtained:  Verbal   Consent given by:  Patient   Risks, benefits, and alternatives were discussed: yes     Risks discussed:  Infection, bleeding, incomplete drainage and pain   Alternatives discussed:  No treatment and delayed treatment Universal protocol:    Patient identity confirmed:  Verbally with patient Location:    Type:  Abscess   Size:  2   Location:  Anogenital   Anogenital location:  Gluteal cleft Pre-procedure details:    Skin preparation:  Povidone-iodine Sedation:    Sedation type:  None Anesthesia:    Anesthesia method:  Local infiltration   Local anesthetic:  Lidocaine 2% w/o epi Procedure type:    Complexity:  Simple Procedure details:    Incision types:  Single straight   Wound management:  Probed and deloculated   Drainage:  Purulent   Drainage amount:  Copious   Packing materials:  None (declined packing) Post-procedure details:    Procedure completion:  Tolerated well, no immediate complications    Medications Ordered in ED Medications  lidocaine (XYLOCAINE) 2 % (with pres) injection 400 mg (has no administration in time range)  ibuprofen (ADVIL) tablet 800 mg (800 mg Oral Given 01/10/22 1138)    ED Course/ Medical Decision Making/ A&P                           Medical Decision Making Risk OTC drugs. Prescription drug management.   46 year old male presenting today with an abscess to the left gluteal cleft.  Differential includes but is not limited to cellulitis, abscess, Fournier's gangrene/necrotizing fasciitis.  Physical exam performed in presence of nurse tech.  Patient with induration and a small area of fluctuance to the left gluteal cleft.  I was unable to localize the best part for I&D so bedside ultrasound was performed.  Patient with an abscess that appears to be drainable.  Imaging: Patient had CT abdomen pelvis 3 days ago.  It revealed a 2.2 cm area consistent with infection/abscess.  Treatment: I suggested I&D however patient says  that he has had multiple of these in the past that are painful.  Would prefer to start antibiotics and a better pain management and return with any worsening symptoms.  MDM/disposition: Patient presenting today with an abscess to the left gluteal cleft.  Identified on CT yesterday and bedside ultrasound with me today.  I recommended I&D however patient declined this at this time.  Would prefer to be started on antibiotics.  I have sent doxycycline, ibuprofen and Tylenol to the pharmacy.   11:30a: Patient changes mind and will allow I&D.  This was performed at bedside with  no complications.  Doxycycline, ibuprofen and Tylenol are at his pharmacy.  Final Clinical Impression(s) / ED Diagnoses Final diagnoses:  Abscess    Rx / DC Orders ED Discharge Orders          Ordered    doxycycline (VIBRAMYCIN) 100 MG capsule  2 times daily        01/10/22 1122    ibuprofen (ADVIL) 800 MG tablet  3 times daily        01/10/22 1122    acetaminophen (TYLENOL) 500 MG tablet  Every 6 hours PRN        01/10/22 1122           Results and diagnoses were explained to the patient. Return precautions discussed in full. Patient had no additional questions and expressed complete understanding.   This chart was dictated using voice recognition software.  Despite best efforts to proofread,  errors can occur which can change the documentation meaning.     Rhae Hammock, PA-C 01/10/22 1127  Upon discharge patient requested I&D.  This was performed as above.    Rhae Hammock, PA-C 01/10/22 1200    Charlesetta Shanks, MD 01/10/22 [phone]

## 2022-01-10 NOTE — ED Triage Notes (Signed)
Pt c/o cyst in his gluteal cleft for four days. Pt denies drainage, no fevers.

## 2022-01-14 ENCOUNTER — Inpatient Hospital Stay: Payer: Medicaid Other | Attending: Hematology | Admitting: Hematology

## 2022-01-14 VITALS — BP 108/69 | HR 78 | Temp 98.2°F | Resp 18 | Ht 71.0 in | Wt 233.4 lb

## 2022-01-14 DIAGNOSIS — E119 Type 2 diabetes mellitus without complications: Secondary | ICD-10-CM | POA: Insufficient documentation

## 2022-01-14 DIAGNOSIS — C20 Malignant neoplasm of rectum: Secondary | ICD-10-CM | POA: Insufficient documentation

## 2022-01-14 DIAGNOSIS — Z87891 Personal history of nicotine dependence: Secondary | ICD-10-CM | POA: Diagnosis not present

## 2022-01-14 DIAGNOSIS — I10 Essential (primary) hypertension: Secondary | ICD-10-CM | POA: Insufficient documentation

## 2022-01-14 DIAGNOSIS — R590 Localized enlarged lymph nodes: Secondary | ICD-10-CM | POA: Diagnosis not present

## 2022-01-14 NOTE — Patient Instructions (Addendum)
[facility] at [facility] Discharge Instructions   You were seen and examined today by Dr. Delton Coombes. He is an oncologist (cancer doctor) who is seeing you today on behalf of Dr. Jenetta Downer. He discovered a tumor on your rectum when he did your colonoscopy.   We will schedule you for an MRI of your pelvis. You will need to do a Fleet enema just prior to this test. These enemas can be purchased over the counter.   Return as scheduled to review MRI results.    Thank you for choosing [facility] at [facility] to provide your oncology and hematology care.  To afford each patient quality time with our provider, please arrive at least 15 minutes before your scheduled appointment time.   If you have a lab appointment with the [facility] please come in thru the Main Entrance and check in at the main information desk.  You need to re-schedule your appointment should you arrive 10 or more minutes late.  We strive to give you quality time with our providers, and arriving late affects you and other patients whose appointments are after yours.  Also, if you no show three or more times for appointments you may be dismissed from the clinic at the providers discretion.     Again, thank you for choosing [facility].  Our hope is that these requests will decrease the amount of time that you wait before being seen by our physicians.       _____________________________________________________________  Should you have questions after your visit to [facility], please contact our office at [phone] and follow the prompts.  Our office hours are 8:00 a.m. and 4:30 p.m. Monday - Friday.  Please note that voicemails left after 4:00 p.m. may not be returned until the following business day.  We are closed weekends and major holidays.  You do have access to a nurse 24-7, just call the main number to the clinic [phone] and do not press any  options, hold on the line and a nurse will answer the phone.    For prescription refill requests, have your pharmacy contact our office and allow 72 hours.    Due to Covid, you will need to wear a mask upon entering the hospital. If you do not have a mask, a mask will be given to you at the Main Entrance upon arrival. For doctor visits, patients may have 1 support person age 35 or older with them. For treatment visits, patients can not have anyone with them due to social distancing guidelines and our immunocompromised population.
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
## 2022-01-16 ENCOUNTER — Other Ambulatory Visit: Payer: Self-pay | Admitting: Cardiology

## 2022-01-16 DIAGNOSIS — I10 Essential (primary) hypertension: Secondary | ICD-10-CM

## 2022-01-16 MED ORDER — LISINOPRIL-HYDROCHLOROTHIAZIDE 20-25 MG PO TABS: 1.0000 | ORAL_TABLET | Freq: Every day | ORAL | 0 refills | Status: DC

## 2022-01-29 ENCOUNTER — Encounter (HOSPITAL_COMMUNITY)
Admission: RE | Admit: 2022-01-29 | Discharge: 2022-01-29 | Disposition: A | Discharge status: Home / Self Care | Payer: Medicaid Other | Source: Ambulatory Visit | Attending: Hematology | Admitting: Hematology

## 2022-01-29 DIAGNOSIS — C20 Malignant neoplasm of rectum: Secondary | ICD-10-CM | POA: Diagnosis not present

## 2022-01-29 MED ORDER — FLUDEOXYGLUCOSE F - 18 (FDG) INJECTION
12.7400 | Freq: Once | INTRAVENOUS | Status: AC | PRN
  Administered 2022-01-29: 12.74 via INTRAVENOUS

## 2022-02-02 ENCOUNTER — Inpatient Hospital Stay (HOSPITAL_BASED_OUTPATIENT_CLINIC_OR_DEPARTMENT_OTHER): Payer: Medicaid Other | Admitting: Hematology

## 2022-02-02 ENCOUNTER — Encounter: Payer: Self-pay | Admitting: Hematology

## 2022-02-02 ENCOUNTER — Ambulatory Visit (HOSPITAL_COMMUNITY)
Admission: RE | Admit: 2022-02-02 | Discharge: 2022-02-02 | Disposition: A | Discharge status: Home / Self Care | Payer: Medicaid Other | Source: Ambulatory Visit | Attending: Hematology | Admitting: Hematology

## 2022-02-02 VITALS — BP 127/81 | HR 77 | Temp 97.5°F | Resp 18 | Ht 71.0 in | Wt 233.0 lb

## 2022-02-02 DIAGNOSIS — Z87891 Personal history of nicotine dependence: Secondary | ICD-10-CM | POA: Diagnosis not present

## 2022-02-02 DIAGNOSIS — C20 Malignant neoplasm of rectum: Secondary | ICD-10-CM | POA: Diagnosis not present

## 2022-02-02 DIAGNOSIS — E119 Type 2 diabetes mellitus without complications: Secondary | ICD-10-CM | POA: Diagnosis not present

## 2022-02-02 DIAGNOSIS — I10 Essential (primary) hypertension: Secondary | ICD-10-CM | POA: Diagnosis not present

## 2022-02-02 DIAGNOSIS — R590 Localized enlarged lymph nodes: Secondary | ICD-10-CM | POA: Diagnosis not present

## 2022-02-02 DIAGNOSIS — D492 Neoplasm of unspecified behavior of bone, soft tissue, and skin: Secondary | ICD-10-CM | POA: Diagnosis not present

## 2022-02-02 MED ORDER — GADOPICLENOL 0.5 MMOL/ML IV SOLN
10.0000 mL | Freq: Once | INTRAVENOUS | Status: AC | PRN
  Administered 2022-02-02: 10 mL via INTRAVENOUS

## 2022-02-02 NOTE — Progress Notes (Signed)
[facility], [place] [address]   CLINIC:  Medical Oncology/Hematology  PCP:  Alvira Monday, [address] [place] [address] [phone]   REASON FOR VISIT:  Follow-up for T3N0 high rectal cancer.  PRIOR THERAPY: None  NGS Results: MSI results pending  CURRENT THERAPY: Under work-up  BRIEF ONCOLOGIC HISTORY:  Oncology History   No history exists.    CANCER STAGING: Cancer Staging  No matching staging information was found for the patient.   INTERVAL HISTORY:  Raymond Malone 46 y.o. male seen for follow-up of newly diagnosed rectal adenocarcinoma.  Reports appetite 100% and energy level 75%.  Denies any bleeding per rectum or melena.  No rectal pain reported.  He has infection in the left groin and is being seen by his PMD tomorrow.    REVIEW OF SYSTEMS:  Review of Systems  All other systems reviewed and are negative.    PAST MEDICAL/SURGICAL HISTORY:  Past Medical History:  Diagnosis Date   Abscess, axilla 10/06/2016   Alcohol dependence ([place]) 10/06/2016   Current smoker 10/06/2016   Dyshidrotic eczema 10/27/2016   GERD (gastroesophageal reflux disease)    Hyperlipidemia    Hyperosmolar non-ketotic state in patient with type 2 diabetes mellitus ([facility]) 10/06/2016   Hypertension    MRSA (methicillin resistant Staphylococcus aureus)    Skin abscess    Type 2 diabetes mellitus ([facility])    Past Surgical History:  Procedure Laterality Date   ANKLE SURGERY     BIOPSY  01/06/2022   Procedure: BIOPSY;  Surgeon: Harvel Quale, MD;  [facility];  Service: Gastroenterology;;   COLONOSCOPY WITH PROPOFOL N/A 01/06/2022   Procedure: COLONOSCOPY WITH PROPOFOL;  Surgeon: Harvel Quale, MD;  [facility];  Service: Gastroenterology;  Laterality: N/A;  730   HAND SURGERY     boxer's fracture L hand   INCISION AND DRAINAGE PERIRECTAL ABSCESS     MYRINGOTOMY WITH TUBE PLACEMENT Bilateral  07/22/2020   Procedure: BILATERAL MYRINGOTOMY WITH TUBE PLACEMENT;  Surgeon: Leta Baptist, MD;  Location: [facility];  Service: ENT;  Laterality: Bilateral;   POLYPECTOMY  01/06/2022   Procedure: POLYPECTOMY INTESTINAL;  Surgeon: Harvel Quale, MD;  [facility];  Service: Gastroenterology;;   SVT ABLATION N/A 04/16/2021   Procedure: SVT ABLATION;  Surgeon: Evans Lance, MD;  Location: [facility];  Service: Cardiovascular;  Laterality: N/A;     SOCIAL HISTORY:  Social History   Socioeconomic History   Marital status: Significant Other    Spouse name: Shameka   Number of children: 7   Years of education: 14   Highest education level: Not on file  Occupational History   Occupation: factory work, sorting  Tobacco Use   Smoking status: Former    Packs/day: 1.00    Types: Cigarettes    Start date: 05/11/1994   Smokeless tobacco: Never  Vaping Use   Vaping Use: Every day  Substance and Sexual Activity   Alcohol use: Not Currently    Comment: Occasional   Drug use: Not Currently    Types: Marijuana    Comment: last=yesterday   Sexual activity: Yes    Birth control/protection: Condom  Other Topics Concern   Not on file  Social History Narrative   Lives with fiancee, mother, and 7 children, 3 are biological his, 2 are hers and 1 nephew   No pets in the home  Enjoys: spending time with the kids      Diet: Eats all food groups, does not have a true focus for diabetes   Caffeine: Drinks about 1 or 2 sodas a week overall has reduced his caffeine intake sometimes some tea   Water: Reports taking 6-8 times a day for more      Wears a seatbelt, does not use his phone or driving   Smoke detectors at home   Does not have fire extinguisher at this time   No weapons in the home   Social Determinants of Health   Financial Resource Strain: Low Risk  (02/09/2020)   Overall Financial Resource Strain (CARDIA)    Difficulty of Paying Living  Expenses: Not hard at all  Food Insecurity: No Food Insecurity (02/09/2020)   Hunger Vital Sign    Worried About Running Out of Food in the Last Year: Never true    Ran Out of Food in the Last Year: Never true  Transportation Needs: No Transportation Needs (02/09/2020)   PRAPARE - Hydrologist (Medical): No    Lack of Transportation (Non-Medical): No  Physical Activity: Inactive (02/09/2020)   Exercise Vital Sign    Days of Exercise per Week: 0 days    Minutes of Exercise per Session: 0 min  Stress: Stress Concern Present (02/09/2020)   Jessie    Feeling of Stress : To some extent  Social Connections: Moderately Isolated (02/09/2020)   Social Connection and Isolation Panel [NHANES]    Frequency of Communication with Friends and Family: More than three times a week    Frequency of Social Gatherings with Friends and Family: More than three times a week    Attends Religious Services: Never    Marine scientist or Organizations: No    Attends Archivist Meetings: Never    Marital Status: Living with partner  Intimate Partner Violence: Not At Risk (02/09/2020)   Humiliation, Afraid, Rape, and Kick questionnaire    Fear of Current or Ex-Partner: No    Emotionally Abused: No    Physically Abused: No    Sexually Abused: No    FAMILY HISTORY:  Family History  Problem Relation Age of Onset   Hypertension Mother     CURRENT MEDICATIONS:  Outpatient Encounter Medications as of 02/02/2022  Medication Sig   Accu-Chek Softclix Lancets lancets 1 each by Other route 2 (two) times daily. Use as instructed   acetaminophen (TYLENOL) 500 MG tablet Take 1 tablet (500 mg total) by mouth every 6 (six) hours as needed.   blood glucose meter kit and supplies Dispense based on patient and insurance preference. Use up to four times daily as directed. (FOR ICD-10 E10.9, E11.9).   Blood Glucose  Monitoring Suppl (ACCU-CHEK GUIDE ME) w/Device KIT 1 Piece by Does not apply route as directed.   doxycycline (VIBRAMYCIN) 100 MG capsule Take 1 capsule (100 mg total) by mouth 2 (two) times daily.   glipiZIDE (GLUCOTROL) 5 MG tablet Take 1 tablet (5 mg total) by mouth daily before breakfast.   glucose blood (ACCU-CHEK GUIDE) test strip Use to test glucose 2 times a day.   ibuprofen (ADVIL) 800 MG tablet Take 1 tablet (800 mg total) by mouth 3 (three) times daily.   Insulin Pen Needle (PEN NEEDLES) 31G X 5 MM MISC 1 each by Does not apply route in the morning, at noon, in the evening, and at bedtime.  lisinopril-hydrochlorothiazide (ZESTORETIC) 20-25 MG tablet Take 1 tablet by mouth once daily   lisinopril-hydrochlorothiazide (ZESTORETIC) 20-25 MG tablet Take 1 tablet by mouth daily.   metFORMIN (GLUCOPHAGE) 1000 MG tablet Take 1 tablet (1,000 mg total) by mouth 2 (two) times daily with a meal.   pantoprazole (PROTONIX) 40 MG tablet Take 1 tablet (40 mg total) by mouth daily.   simvastatin (ZOCOR) 40 MG tablet Take 1 tablet by mouth in the evening   No facility-administered encounter medications on file as of 02/02/2022.    ALLERGIES:  No Known Allergies   PHYSICAL EXAM:  ECOG Performance status: 0  Vitals:   02/02/22 1515  BP: 127/81  Pulse: 77  Resp: 18  Temp: (!) 97.5 F (36.4 C)  SpO2: 98%   Filed Weights   02/02/22 1515  Weight: 233 lb (105.7 kg)   Physical Exam Vitals reviewed.  Constitutional:      Appearance: Normal appearance.  Cardiovascular:     Rate and Rhythm: Normal rate and regular rhythm.     Heart sounds: Normal heart sounds.  Pulmonary:     Breath sounds: Normal breath sounds.  Abdominal:     Palpations: Abdomen is soft.  Neurological:     Mental Status: He is alert.  Psychiatric:        Mood and Affect: Mood normal.        Behavior: Behavior normal.      LABORATORY DATA:  I have reviewed the labs as listed.  CBC    Component Value  Date/Time   WBC 6.9 01/06/2022 1146   RBC 4.98 01/06/2022 1146   HGB 14.7 01/06/2022 1146   HGB 15.1 02/09/2020 1013   HCT 42.4 01/06/2022 1146   HCT 44.7 02/09/2020 1013   PLT 351 01/06/2022 1146   PLT 318 02/09/2020 1013   MCV 85.1 01/06/2022 1146   MCV 86 02/09/2020 1013   MCH 29.5 01/06/2022 1146   MCHC 34.7 01/06/2022 1146   RDW 11.9 01/06/2022 1146   RDW 12.1 02/09/2020 1013   LYMPHSABS 2.5 10/30/2016 2206   MONOABS 0.7 10/30/2016 2206   EOSABS 0.5 10/30/2016 2206   BASOSABS 0.0 10/30/2016 2206      Latest Ref Rng & Units 01/08/2022    1:54 PM 11/21/2021    3:11 PM 06/10/2021   11:23 AM  CMP  Glucose 70 - 99 mg/dL  124  189   BUN 6 - 24 mg/dL  9  16   Creatinine 0.61 - 1.24 mg/dL 1.00  0.83  0.98   Sodium 134 - 144 mmol/L  137  136   Potassium 3.5 - 5.2 mmol/L  3.9  4.2   Chloride 96 - 106 mmol/L  98  98   CO2 20 - 29 mmol/L  22  20   Calcium 8.7 - 10.2 mg/dL  10.0  10.2   Total Protein 6.0 - 8.5 g/dL  7.1  7.0   Total Bilirubin 0.0 - 1.2 mg/dL  0.7  0.7   Alkaline Phos 44 - 121 IU/L  88  96   AST 0 - 40 IU/L  12  22   ALT 0 - 44 IU/L  22  29     DIAGNOSTIC IMAGING:  I have independently reviewed the scans and discussed with the patient.  ASSESSMENT: 1.  Stage II (T3N0) rectal adenocarcinoma: - Colonoscopy (01/06/2022): Ulcerated nonobstructing medium-sized mass found at 8 cm proximal to the anus extending to 11 cm.  Mass was not circumferential. - Pathology:  Adenocarcinoma arising in adenoma with high-grade dysplasia.  Tubular adenomas in the ascending colon and hyperplastic polyp in the descending colon. - CT CAP (01/08/2022): Mildly enlarged left external iliac lymph node measures 17 mm in short axis.  No evidence of metastatic disease in the chest or abdomen.  Subcutaneous stranding with skin thickening along the left gluteal crease and a 2.2 cm fluid collection. - MRI pelvis (01/10/2022): T3N0.  Distance from tumor to the internal anal sphincter is 7 cm. - CEA  (01/06/2022): 12.0   2.  Social/family history: - He lives at home with his fiance and children.  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  Quit smoking cigarettes 5 to 6 months ago.  Smoked 1 pack/day for 28 years. - No family history of malignancies.    PLAN:  1.  Stage II (T3N0) rectal adenocarcinoma: - We reviewed MRI of the pelvis from 02/02/2022.  T stage is T3a with no lymph node involvement. - We reviewed PET scan from 01/29/2022 which did not show any evidence of metastatic disease.  Skin/subcutaneous hypermetabolic in the left inguinal crease with mild left external iliac adenopathy is from infection. - We discussed the option of total neoadjuvant therapy with 4 months of FOLFOX followed by chemoradiation followed by surgery.  We also discussed upfront transabdominal resection as an option as he appears to have high rectal tumor. - He is seeing Dr. Dema Severin tomorrow.  We will make a decision after that. - I have also recommended MSI testing on the pathology. - If Dr. Dema Severin feels he needs TNT, proceed with port placement. - We also discussed chemotherapy regimen and side effects.      Orders placed this encounter:  No orders of the defined types were placed in this encounter.     Derek Jack, MD [facility] [phone]

## 2022-02-02 NOTE — Patient Instructions (Addendum)
[facility]  Discharge Instructions  You were seen and examined today by Dr. Delton Coombes.  Dr. Delton Coombes discussed your most recent PET scan and MRI. It revealed no spread of the cancer outside of the rectal area and surrounding lymph nodes.  The standard course of treatment for this is 4 months of chemotherapy to be given once every 2 weeks for a total of 8 treatments. This is given via a Port-A-Cath. Following 4 months of chemotherapy is a combination of oral chemotherapy and radiation. Following completion of chemotherapy and oral chemo/radiation, you would then go back to the surgeon for surgical evaluation.  We will ask Dr. Dema Severin to place a Port-A-Cath.  Follow-up as scheduled.  Thank you for choosing [facility] to provide your oncology and hematology care.   To afford each patient quality time with our provider, please arrive at least 15 minutes before your scheduled appointment time. You may need to reschedule your appointment if you arrive late (10 or more minutes). Arriving late affects you and other patients whose appointments are after yours.  Also, if you miss three or more appointments without notifying the office, you may be dismissed from the clinic at the provider's discretion.    Again, thank you for choosing [facility].  Our hope is that these requests will decrease the amount of time that you wait before being seen by our physicians.   If you have a lab appointment with the [facility] please come in thru the Main Entrance and check in at the main information desk.           _____________________________________________________________  Should you have questions after your visit to [facility], please contact our office at [phone] and follow the prompts.  Our office hours are 8:00 a.m. to 4:30 p.m. Monday - Thursday and 8:00 a.m. to 2:30 p.m. Friday.  Please note that voicemails left  after 4:00 p.m. may not be returned until the following business day.  We are closed weekends and all major holidays.  You do have access to a nurse 24-7, just call the main number to the clinic [phone] and do not press any options, hold on the line and a nurse will answer the phone.    For prescription refill requests, have your pharmacy contact our office and allow 72 hours.    Masks are optional in the cancer centers. If you would like for your care team to wear a mask while they are taking care of you, please let them know. You may have one support person who is at least 45 years old accompany you for your appointments.

## 2022-02-03 ENCOUNTER — Ambulatory Visit: Payer: Medicaid Other | Admitting: Family Medicine

## 2022-02-03 ENCOUNTER — Encounter: Payer: Self-pay | Admitting: Family Medicine

## 2022-02-03 VITALS — BP 122/78 | HR 70 | Wt 234.0 lb

## 2022-02-03 DIAGNOSIS — G8929 Other chronic pain: Secondary | ICD-10-CM

## 2022-02-03 DIAGNOSIS — L02818 Cutaneous abscess of other sites: Secondary | ICD-10-CM | POA: Diagnosis not present

## 2022-02-03 DIAGNOSIS — Z23 Encounter for immunization: Secondary | ICD-10-CM | POA: Diagnosis not present

## 2022-02-03 DIAGNOSIS — M25572 Pain in left ankle and joints of left foot: Secondary | ICD-10-CM

## 2022-02-03 DIAGNOSIS — Z1159 Encounter for screening for other viral diseases: Secondary | ICD-10-CM | POA: Diagnosis not present

## 2022-02-03 DIAGNOSIS — C20 Malignant neoplasm of rectum: Secondary | ICD-10-CM | POA: Diagnosis not present

## 2022-02-03 NOTE — Progress Notes (Signed)
Acute Office Visit  Subjective:     Patient ID: Raymond Malone, male    DOB: Oct 06, 1975, 46 y.o.   MRN: [id number]  Chief Complaint  Patient presents with   boils    Having problems getting boils coming and going been going on for about 20 years now ..they come under the right arm and sometimes on the face and left buttocks .Raymond Malone    HPI The patient is in today for c/o of left ankle pain and would like to know why he constantly has flare-ups of skin abscesses.   Left ankle Pain: hx of left ankle surgery with plates and screws 20 years ago. He noted that his left ankle had been doing well since the surgery. He had no complaints until 2-3 months ago when he started to feel a sharp, aching pain in his ankle with certain movements and twisting. He notes the pain comes sporidially while sitting still at times. He denies any swelling, redness, and warmth of the affected ankle. No recent injury or trauma was reported.  Review of Systems  Constitutional:  Negative for chills and fever.  Respiratory:  Negative for cough.   Cardiovascular:  Negative for chest pain and leg swelling.  Genitourinary:  Negative for frequency and hematuria.  Musculoskeletal:  Positive for joint pain.  Psychiatric/Behavioral:  Negative for memory loss. The patient does not have insomnia.         Objective:    BP 122/78 (BP Location: Right Arm, Patient Position: Sitting)   Pulse 70   Wt 234 lb (106.1 kg)   SpO2 93%   BMI 32.64 kg/m    Physical Exam HENT:     Head: Normocephalic.     Right Ear: External ear normal.     Left Ear: External ear normal.  Cardiovascular:     Rate and Rhythm: Normal rate and regular rhythm.     Pulses: Normal pulses.     Heart sounds: Normal heart sounds.  Pulmonary:     Effort: Pulmonary effort is normal.     Breath sounds: Normal breath sounds.  Musculoskeletal:     Comments: Pain noted with pronation and eversion of the left ankle  Skin:    Findings: Lesion present.   Neurological:     Mental Status: He is alert.     No results found for any visits on 02/03/22.      Assessment & Plan:   Problem List Items Addressed This Visit       Musculoskeletal and Integument   Skin abscess    Informed patient that skin abscess is caused by germs that enter the skin through a cut or scrape. It can also be caused by blocked oil and sweat glands or infected hair follicles.        Other   Need for immunization against influenza    Patient educated on CDC recommendation for the vaccine. Verbal consent was obtained from the patient, vaccine administered by nurse, no sign of adverse reactions noted at this time. Patient education on arm soreness and use of tylenol or ibuprofen for this patient  was discussed. Patient educated on the signs and symptoms of adverse effect and advise to contact the office if they occur.      Relevant Orders   Flu Vaccine QUAD 3moIM (Fluarix, Fluzone & Alfiuria Quad PF) (Completed)   Chronic pain of left ankle - Primary    No signs of inflammation mentioned Given the patient's hx of left ankle  surgery, I will place a referral to orthopedics for further evaluation Encouraged to take Tylenol as needed for pain       Relevant Orders   Ambulatory referral to Orthopedic Surgery   Other Visit Diagnoses     Flu vaccine need       Relevant Orders   Flu Vaccine QUAD 6+ mos PF IM (Fluarix Quad PF)   Need for hepatitis C screening test       Relevant Orders   Hepatitis C antibody       No orders of the defined types were placed in this encounter.   Return if symptoms worsen or fail to improve.  Raymond Monday, FNP

## 2022-02-03 NOTE — Patient Instructions (Addendum)
I appreciate the opportunity to provide care to you today!        Referral: orthopedic surgery    Please continue to a heart-healthy diet and increase your physical activities. Try to exercise for 77mns at least three times a week.      It was a pleasure to see you and I look forward to continuing to work together on your health and well-being. Please do not hesitate to call the office if you need care or have questions about your care.   Have a wonderful day and week. With Gratitude, GAlvira MondayMSN, FNP-BC

## 2022-02-03 NOTE — Progress Notes (Signed)
I met with the patient during and following initial visit with Dr. Delton Coombes. I provided written information on the chemotherapy regimen recommended by Dr. Delton Coombes. Encouraged the patient to call with questions or concerns.

## 2022-02-04 ENCOUNTER — Other Ambulatory Visit: Payer: Self-pay

## 2022-02-04 ENCOUNTER — Encounter: Payer: Self-pay | Admitting: Hematology

## 2022-02-04 DIAGNOSIS — G8929 Other chronic pain: Secondary | ICD-10-CM | POA: Insufficient documentation

## 2022-02-04 NOTE — Assessment & Plan Note (Signed)
No signs of inflammation mentioned Given the patient's hx of left ankle surgery, I will place a referral to orthopedics for further evaluation Encouraged to take Tylenol as needed for pain

## 2022-02-04 NOTE — Assessment & Plan Note (Signed)
Patient educated on CDC recommendation for the vaccine. Verbal consent was obtained from the patient, vaccine administered by nurse, no sign of adverse reactions noted at this time. Patient education on arm soreness and use of tylenol or ibuprofen for this patient  was discussed. Patient educated on the signs and symptoms of adverse effect and advise to contact the office if they occur.  

## 2022-02-04 NOTE — Progress Notes (Signed)
The proposed treatment discussed in conference is for discussion purpose only and is not a binding recommendation.  The patients have not been physically examined, or presented with their treatment options.  Therefore, final treatment plans cannot be decided.  

## 2022-02-04 NOTE — Progress Notes (Signed)
START ON PATHWAY REGIMEN - Colorectal     A cycle is every 14 days:     Oxaliplatin      Leucovorin      Fluorouracil      Fluorouracil   **Always confirm dose/schedule in your pharmacy ordering system**  Patient Characteristics: Preoperative or Nonsurgical Candidate, M0 (Clinical Staging), Rectal, cT2, cN1 or cT3, cN0-1, and Not a Candidate for Sphincter-sparing Surgery or Neoadjuvant ChemoRT Preferred Tumor Location: Rectal Therapeutic Status: Preoperative or Nonsurgical Candidate, M0 (Clinical Staging) AJCC T Category: cT3 AJCC N Category: cN0 AJCC M Category: cM0 AJCC 8 Stage Grouping: IIA Intent of Therapy: Curative Intent, Discussed with Patient 

## 2022-02-04 NOTE — Assessment & Plan Note (Signed)
Informed patient that skin abscess is caused by germs that enter the skin through a cut or scrape. It can also be caused by blocked oil and sweat glands or infected hair follicles.

## 2022-02-05 ENCOUNTER — Other Ambulatory Visit: Payer: Self-pay | Admitting: Radiology

## 2022-02-05 ENCOUNTER — Other Ambulatory Visit: Payer: Self-pay

## 2022-02-05 DIAGNOSIS — C20 Malignant neoplasm of rectum: Secondary | ICD-10-CM

## 2022-02-05 NOTE — Progress Notes (Signed)
Order placed for IR port placement per Dr. Katragadda ?

## 2022-02-09 ENCOUNTER — Other Ambulatory Visit (HOSPITAL_COMMUNITY): Payer: Self-pay | Admitting: Physician Assistant

## 2022-02-09 NOTE — Progress Notes (Signed)
Phone call to pt to discuss procedure tomorrow. Pt verbalizes understanding of all instructions.

## 2022-02-10 ENCOUNTER — Encounter (HOSPITAL_COMMUNITY): Payer: Self-pay

## 2022-02-10 ENCOUNTER — Other Ambulatory Visit: Payer: Self-pay

## 2022-02-10 ENCOUNTER — Ambulatory Visit (HOSPITAL_COMMUNITY)
Admission: RE | Admit: 2022-02-10 | Discharge: 2022-02-10 | Disposition: A | Discharge status: Home / Self Care | Payer: Medicaid Other | Source: Ambulatory Visit | Attending: Hematology | Admitting: Hematology

## 2022-02-10 DIAGNOSIS — C20 Malignant neoplasm of rectum: Secondary | ICD-10-CM | POA: Diagnosis not present

## 2022-02-10 DIAGNOSIS — Z452 Encounter for adjustment and management of vascular access device: Secondary | ICD-10-CM | POA: Diagnosis not present

## 2022-02-10 HISTORY — PX: IR IMAGING GUIDED PORT INSERTION: IMG5740

## 2022-02-10 MED ORDER — LIDOCAINE-PRILOCAINE 2.5-2.5 % EX CREA: TOPICAL_CREAM | CUTANEOUS | 3 refills | Status: DC

## 2022-02-10 MED ORDER — FENTANYL CITRATE (PF) 100 MCG/2ML IJ SOLN
INTRAMUSCULAR | Status: AC
  Filled 2022-02-10: qty 2

## 2022-02-10 MED ORDER — FENTANYL CITRATE (PF) 100 MCG/2ML IJ SOLN
INTRAMUSCULAR | Status: AC | PRN
  Administered 2022-02-10 (×2): 25 ug via INTRAVENOUS

## 2022-02-10 MED ORDER — MIDAZOLAM HCL 2 MG/2ML IJ SOLN
INTRAMUSCULAR | Status: AC
  Filled 2022-02-10: qty 2

## 2022-02-10 MED ORDER — PROCHLORPERAZINE MALEATE 10 MG PO TABS: 10.0000 mg | ORAL_TABLET | Freq: Four times a day (QID) | ORAL | 3 refills | Status: DC | PRN

## 2022-02-10 MED ORDER — MIDAZOLAM HCL 2 MG/2ML IJ SOLN
INTRAMUSCULAR | Status: AC | PRN
  Administered 2022-02-10: .5 mg via INTRAVENOUS

## 2022-02-10 MED ORDER — LIDOCAINE-EPINEPHRINE 1 %-1:100000 IJ SOLN
INTRAMUSCULAR | Status: AC
  Administered 2022-02-10: 12 mL
  Filled 2022-02-10: qty 1

## 2022-02-10 MED ORDER — SODIUM CHLORIDE 0.9 % IV SOLN: INTRAVENOUS | Status: DC

## 2022-02-10 MED ORDER — HEPARIN SOD (PORK) LOCK FLUSH 100 UNIT/ML IV SOLN
INTRAVENOUS | Status: AC
  Administered 2022-02-10: 500 [IU]
  Filled 2022-02-10: qty 5
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
## 2022-02-10 NOTE — Patient Instructions (Addendum)
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 You will be treated in the clinic every 2 weeks for a total of 4 months with a chemotherapy regimen called FOLFOX. The drugs you will be receiving are called oxaliplatin, fluorouracil (5FU), and leucovorin. The oxaliplatin and 5FU are chemotherapy drugs. The leucovorin is a vitamin that helps the 5FU drug work better. After you complete chemotherapy, you will start radiation treatments and be placed on a chemotherapy pill called Xeloda. Xeloda is the oral form of 5FU and has the same side effects. After you complete radiation, you will go for surgery to remove the tumor. The intent of treatment is cure. You will see the doctor regularly throughout treatment.  We will obtain blood work from you prior to every treatment and monitor your results to make sure it is safe to give your treatment. The doctor monitors your response to treatment by the way you are feeling, your blood work, and by obtaining scans periodically. There will be wait times while you are here for treatment.  It will take about 30 minutes to 1 hour for your lab work to result.  Then there will be wait times while pharmacy mixes your medications.   Medications you will receive in the clinic prior to your chemotherapy medications:  Aloxi:  ALOXI is used in adults to help prevent nausea and vomiting that happens with certain chemotherapy drugs.  Aloxi is a long acting medication, and will remain in your system for about two days.   Dexamethasone:  This is a steroid given prior to chemotherapy to help prevent allergic reactions; it may also help prevent and control nausea and diarrhea.     Oxaliplatin (Eloxatin)  About This Drug  Oxaliplatin is used to treat cancer. It is given in the vein (IV).  It takes two hours to infuse.  Possible Side Effects   Bone marrow suppression. This is a decrease in the number of white blood cells,  red blood cells, and platelets. This may raise your risk of infection, make you tired and weak (fatigue), and raise your risk of bleeding.   Tiredness   Soreness of the mouth and throat. You may have red areas, white patches, or sores that hurt.   Nausea and vomiting (throwing up)   Diarrhea (loose bowel movements)   Changes in your liver function   Effects on the nerves called peripheral neuropathy. You may feel numbness, tingling, or pain in your hands and feet, and may be worse in cold temperatures. It may be hard for you to button your clothes, open jars, or walk as usual. The effect on the nerves may get worse with more doses of the drug. These effects get better in some people after the drug is stopped but it does not get better in all people  Note: Each of the side effects above was reported in 40% or greater of patients treated with oxaliplatin. Not all possible side effects are included above.   Warnings and Precautions   Allergic reactions, including anaphylaxis, which may be life-threatening are rare but may happen in some patients. Signs of allergic reaction to this drug may be swelling of the face, feeling like your tongue or throat are swelling, trouble breathing, rash, itching, fever, chills, feeling dizzy, and/or feeling that your heart is beating in a fast or not normal way. If this happens, do not take another dose of this drug. You should get urgent medical treatment.  Inflammation (swelling) of the lungs, which may be life-threatening. You may have a dry cough or trouble breathing.   Effects on the nerves (neuropathy) may resolve within 14 days, or it may persist beyond 14 days.   Severe decrease in white blood cells when combined with the chemotherapy agents 5-fluorouracil and leucovorin. This may be life-threatening.   Severe changes in your liver function   Abnormal heart beat and/or EKG, which can be life-threatening   Rhabdomyolysis- damage to your muscles  which may release proteins in your blood and affect how your kidneys work, which can be life-threatening. You may have severe muscle weakness and/or pain, or dark urine.  Important Information   This drug may impair your ability to drive or use machinery. Talk to your doctor and/or nurse about precautions you may need to take.   This drug may be present in the saliva, tears, sweat, urine, stool, vomit, semen, and vaginal secretions. Talk to your doctor and/or your nurse about the necessary precautions to take during this time.  * The effects on the nerves can be aggravated by exposure to cold. Avoid cold beverages, use of ice and make sure you cover your skin and dress warmly prior to being exposed to cold temperatures while you are receiving treatment with oxaliplatin*   Treating Side Effects   Manage tiredness by pacing your activities for the day.   Be sure to include periods of rest between energy-draining activities.   To decrease the risk of infection, wash your hands regularly.   Avoid close contact with people who have a cold, the flu, or other infections.  Take your temperature as your doctor or nurse tells you, and whenever you feel like you may have a fever.   To help decrease the risk of bleeding, use a soft toothbrush. Check with your nurse before using dental floss.   Be very careful when using knives or tools.   Use an electric shaver instead of a razor.   Drink plenty of fluids (a minimum of eight glasses per day is recommended).   Mouth care is very important. Your mouth care should consist of routine, gentle cleaning of your teeth or dentures and rinsing your mouth with a mixture of 1/2 teaspoon of salt in 8 ounces of water or 1/2 teaspoon of baking soda in 8 ounces of water. This should be done at least after each meal and at bedtime.   If you have mouth sores, avoid mouthwash that has alcohol. Also avoid alcohol and smoking because they can bother your mouth and  throat.   To help with nausea and vomiting, eat small, frequent meals instead of three large meals a day. Choose foods and drinks that are at room temperature. Ask your nurse or doctor about other helpful tips and medicine that is available to help stop or lessen these symptoms.   If you throw up or have loose bowel movements, you should drink more fluids so that you do not become dehydrated (lack of water in the body from losing too much fluid).   If you have diarrhea, eat low-fiber foods that are high in protein and calories and avoid foods that can irritate your digestive tracts or lead to cramping.   Ask your nurse or doctor about medicine that can lessen or stop your diarrhea.   If you have numbness and tingling in your hands and feet, be careful when cooking, walking, and handling sharp objects and hot liquids.   Do not drink cold  drinks or use ice in beverages. Drink fluids at room temperature or warmer, and drink through a straw.   Wear gloves to touch cold objects, and wear warm clothing and cover you skin during cold weather.   Food and Drug Interactions   There are no known interactions of oxaliplatin with food and other medications.   This drug may interact with other medicines. Tell your doctor and pharmacist about all the prescription and over-the-counter medicines and dietary supplements (vitamins, minerals, herbs and others) that you are taking at this time. Also, check with your doctor or pharmacist before starting any new prescription or over-the-counter medicines, or dietary supplements to make sure that there are no interactions   When to Call the Doctor  Call your doctor or nurse if you have any of these symptoms and/or any new or unusual symptoms:   Fever of 100.4 F (38 C) or higher   Chills   Tiredness that interferes with your daily activities   Feeling dizzy or lightheaded   Easy bleeding or bruising   Feeling that your heart is beating in a fast or  not normal way (palpitations)   Pain in your chest   Dry cough   Trouble breathing   Pain in your mouth or throat that makes it hard to eat or drink   Nausea that stops you from eating or drinking and/or is not relieved by prescribed medicines   Throwing up   Diarrhea, 4 times in one day or diarrhea with lack of strength or a feeling of being dizzy   Numbness, tingling, or pain in your hands and feet   Signs of possible liver problems: dark urine, pale bowel movements, bad stomach pain, feeling very tired and weak, unusual itching, or yellowing of the eyes or skin   Signs of rhabdomyolysis: decreased urine, very dark urine, muscle pain in the shoulders, thighs, or lower back; muscle weakness or trouble moving arms and legs   Signs of allergic reaction: swelling of the face, feeling like your tongue or throat are swelling, trouble breathing, rash, itching, fever, chills, feeling dizzy, and/or feeling that your heart is beating in a fast or not normal way. If this happens, call 911 for emergency care.   If you think you may be pregnant  Reproduction Warnings   Pregnancy warning: This drug may have harmful effects on the unborn baby. Women of childbearing potential should use effective methods of birth control during your cancer treatment. Let your doctor know right away if you think you may be pregnant or may have impregnated your partner.   Breastfeeding warning: It is not known if this drug passes into breast milk. For this reason, women should talk to their doctor about the risks and benefits of breastfeeding during treatment with this drug because this drug may enter the breast milk and cause harm to a breastfeeding baby.   Fertility warning: Human fertility studies have not been done with this drug. Talk with your doctor or nurse if you plan to have children. Ask for information on sperm or egg banking.   Leucovorin Calcium  About This Drug  Leucovorin is a vitamin. It is  used in combination with other cancer fighting drugs such as 5-fluorouracil and methotrexate. Leucovorin is given in the vein (IV).  This drug runs at the same time as the oxaliplatin and takes 2 hours to infuse.   Possible Side Effects  Rash and itching  Note: Leucovorin by itself has very few side effects. Other  side effects you may have can be caused by the other drugs you are taking, such as 5-fluorouracil.   Warnings and Precautions   Allergic reactions, including anaphylaxis are rare but may happen in some patients. Signs of allergic reaction to this drug may be swelling of the face, feeling like your tongue or throat are swelling, trouble breathing, rash, itching, fever, chills, feeling dizzy, and/or feeling that your heart is beating in a fast or not normal way. If this happens, do not take another dose of this drug. You should get urgent medical treatment.  Food and Drug Interactions   There are no known interactions of leucovorin with food.   This drug may interact with other medicines. Tell your doctor and pharmacist about all the prescription and over-the-counter medicines and dietary supplements (vitamins, minerals, herbs and others) that you are taking at this time.   Also, check with your doctor or pharmacist before starting any new prescription or over-the-counter medicines, or dietary supplements to make sure that there are no interactions.   When to Call the Doctor  Call your doctor or nurse if you have any of these symptoms and/or any new or unusual symptoms:   A new rash or a rash that is not relieved by prescribed medicines   Signs of allergic reaction: swelling of the face, feeling like your tongue or throat are swelling, trouble breathing, rash, itching, fever, chills, feeling dizzy, and/or feeling that your heart is beating in a fast or not normal way. If this happens, call 911 for emergency care.   If you think you may be pregnant   Reproduction Warnings    Pregnancy warning: It is not known if this drug may harm an unborn child. For this reason, be sure to talk with your doctor if you are pregnant or planning to become pregnant while receiving this drug. Let your doctor know right away if you think you may be pregnant   Breastfeeding warning: It is not known if this drug passes into breast milk. For this reason, women should talk to their doctor about the risks and benefits of breastfeeding during treatment with this drug because this drug may enter the breast milk and cause harm to a breastfeeding baby.   Fertility warning: Human fertility studies have not been done with this drug. Talk with your doctor or nurse if you plan to have children. Ask for information on sperm or egg banking.   5-Fluorouracil (Adrucil; 5FU)  About This Drug  Fluorouracil is used to treat cancer. It is given in the vein (IV). It is given as an IV push from a syringe and also as a continuous infusion given via an ambulatory pump (a pump you take home and wear for a specified amount of time).  Possible Side Effects   Bone marrow suppression. This is a decrease in the number of white blood cells, red blood cells, and platelets. This may raise your risk of infection, make you tired and weak (fatigue), and raise your risk of bleeding   Changes in the tissue of the heart and/or heart attack. Some changes may happen that can cause your heart to have less ability to pump blood.   Blurred vision or other changes in eyesight   Nausea and throwing up (vomiting)   Diarrhea (loose bowel movements)   Ulcers - sores that may cause pain or bleeding in your digestive tract, which includes your mouth, esophagus, stomach, small/large intestines and rectum   Soreness of the mouth and  throat. You may have red areas, white patches, or sores that hurt.   Allergic reactions, including anaphylaxis are rare but may happen in some patients. Signs of allergic reaction to this drug may be  swelling of the face, feeling like your tongue or throat are swelling, trouble breathing, rash, itching, fever, chills, feeling dizzy, and/or feeling that your heart is beating in a fast or not normal way. If this happens, do not take another dose of this drug. You should get urgent medical treatment.   Sensitivity to light (photosensitivity). Photosensitivity means that you may become more sensitive to the sun and/or light. You may get a skin rash/reaction if you are in the sun or are exposed to sun lamps and tanning beds. Your eyes may water more, mostly in bright light.   Changes in your nail color, nail loss and/or brittle nail   Darkening of the skin, or changes to the color of your skin and/or veins used for infusion   Rash, dry skin, or itching  Note: Not all possible side effects are included above.  Warnings and Precautions   Hand-and-foot syndrome. The palms of your hands or soles of your feet may tingle, become numb, painful, swollen, or red.   Changes in your central nervous system can happen. The central nervous system is made up of your brain and spinal cord. You could feel extreme tiredness, agitation, confusion, hallucinations (see or hear things that are not there), trouble understanding or speaking, loss of control of your bowels or bladder, eyesight changes, numbness or lack of strength to your arms, legs, face, or body, or coma. If you start to have any of these symptoms let your doctor know right away.   Side effects of this drug may be unexpectedly severe in some patients  Note: Some of the side effects above are very rare. If you have concerns and/or questions, please discuss them with your medical team.   Important Information   This drug may be present in the saliva, tears, sweat, urine, stool, vomit, semen, and vaginal secretions. Talk to your doctor and/or your nurse about the necessary precautions to take during this time.   Treating Side Effects   Manage  tiredness by pacing your activities for the day.   Be sure to include periods of rest between energy-draining activities.   To help decrease the risk of infections, wash your hands regularly.   Avoid close contact with people who have a cold, the flu, or other infections.   Take your temperature as your doctor or nurse tells you, and whenever you feel like you may have a fever.   Use a soft toothbrush. Check with your nurse before using dental floss.   Be very careful when using knives or tools.   Use an electric shaver instead of a razor.   If you have a nose bleed, sit with your head tipped slightly forward. Apply pressure by lightly pinching the bridge of your nose between your thumb and forefinger. Call your doctor if you feel dizzy or faint or if the bleeding doesn't stop after 10 to 15 minutes.   Drink plenty of fluids (a minimum of eight glasses per day is recommended).   If you throw up or have loose bowel movements, you should drink more fluids so that you do not  become dehydrated (lack of water in the body from losing too much fluid).   To help with nausea and vomiting, eat small, frequent meals instead of three large meals  a day. Choose foods and drinks that are at room temperature. Ask your nurse or doctor about other helpful tips and medicine that is available to help, stop, or lessen these symptoms.   If you have diarrhea, eat low-fiber foods that are high in protein and calories and avoid foods that can irritate your digestive tracts or lead to cramping.   Ask your nurse or doctor about medicine that can lessen or stop your diarrhea.   Mouth care is very important. Your mouth care should consist of routine, gentle cleaning of your teeth or dentures and rinsing your mouth with a mixture of 1/2 teaspoon of salt in 8 ounces of water or 1/2 teaspoon of baking soda in 8 ounces of water. This should be done at least after each meal and at bedtime.   If you have mouth sores,  avoid mouthwash that has alcohol. Also avoid alcohol and smoking because they can bother your mouth and throat.   Keeping your nails moisturized may help with brittleness.   To help with itching, moisturize your skin several times day.   Use sunscreen with SPF 30 or higher when you are outdoors even for a short time. Cover up when you are out in the sun. Wear wide-brimmed hats, long-sleeved shirts, and pants. Keep your neck, chest, and back covered. Wear dark sun glasses when in the sun or bright lights.   If you get a rash do not put anything on it unless your doctor or nurse says you may. Keep the area around the rash clean and dry. Ask your doctor for medicine if your rash bothers you.   Keeping your pain under control is important to your well-being. Please tell your doctor or nurse if you are experiencing pain.   Food and Drug Interactions   There are no known interactions of fluorouracil with food.   Check with your doctor or pharmacist about all other prescription medicines and over-the-counter medicines and dietary supplements (vitamins, minerals, herbs and others) you are taking before starting this medicine as there are known drug interactions with 5-fluoroucacil. Also, check with your doctor or pharmacist before starting any new prescription or over-the-counter medicines, or dietary supplements to make sure that there are no interactions.  When to Call the Doctor  Call your doctor or nurse if you have any of these symptoms and/or any new or unusual symptoms:   Fever of 100.4 F (38 C) or higher   Chills   Easy bleeding or bruising   Nose bleed that doesn't stop bleeding after 10-15 minutes   Trouble breathing   Feeling dizzy or lightheaded   Feeling that your heart is beating in a fast or not normal way (palpitations)   Chest pain or symptoms of a heart attack. Most heart attacks involve pain in the center of the chest that lasts more than a few minutes. The pain may  go away and come back or it can be constant. It can feel like pressure, squeezing, fullness, or pain. Sometimes pain is felt in one or both arms, the back, neck, jaw, or stomach. If any of these symptoms last 2 minutes, call 911.   Confusion and/or agitation   Hallucinations   Trouble understanding or speaking   Loss of control of bowels or bladder   Blurry vision or changes in your eyesight   Headache that does not go away   Numbness or lack of strength to your arms, legs, face, or body   Nausea that stops you  from eating or drinking and/or is not relieved by prescribed medicines   Throwing up more than 3 times a day   Diarrhea, 4 times in one day or diarrhea with lack of strength or a feeling of being dizzy   Pain in your mouth or throat that makes it hard to eat or drink   Pain along the digestive tract - especially if worse after eating   Blood in your vomit (bright red or coffee-ground) and/or stools (bright red, or black/tarry)   Coughing up blood   Tiredness that interferes with your daily activities   Painful, red, or swollen areas on your hands or feet or around your nails   A new rash or a rash that is not relieved by prescribed medicines   Develop sensitivity to sunlight/light   Numbness and/or tingling of your hands and/or feet   Signs of allergic reaction: swelling of the face, feeling like your tongue or throat are swelling, trouble breathing, rash, itching, fever, chills, feeling dizzy, and/or feeling that your heart is beating in a fast or not normal way. If this happens, call 911 for emergency care.   If you think you are pregnant or may have impregnated your partner  Reproduction Warnings   Pregnancy warning: This drug may have harmful effects on the unborn baby. Women of child bearing potential should use effective methods of birth control during your cancer treatment and 3 months after treatment. Men with male partners of childbearing potential  should use effective methods of birth control during your cancer treatment and for 3 months after your cancer treatment. Let your doctor know right away if you think you may be pregnant or may have impregnated your partner.   Breastfeeding warning: It is not known if this drug passes into breast milk. For this reason, Women should not breastfeed during treatment because this drug could enter the breast milk and cause harm to a breastfeeding baby.   Fertility warning: In men and women both, this drug may affect your ability to have children in the future. Talk with your doctor or nurse if you plan to have children. Ask for information on sperm or egg banking.   SELF CARE ACTIVITIES WHILE RECEIVING CHEMOTHERAPY:  Hydration Increase your fluid intake and drink at least 8 to 12 cups (64 ounces) of water/decaffeinated beverages per day after treatment. You can still have your cup of coffee or soda but these beverages do not count as part of your 8 to 12 cups that you need to drink daily. No alcohol intake.  Medications Continue taking your normal prescription medication as prescribed.  If you start any new herbal or new supplements please let us know first to make sure it is safe.  Mouth Care Have teeth cleaned professionally before starting treatment. Keep dentures and partial plates clean. Use soft toothbrush and do not use mouthwashes that contain alcohol. Biotene is a good mouthwash that is available at most pharmacies or may be ordered by calling [phone]. Use warm salt water gargles (1 teaspoon salt per 1 quart warm water) before and after meals and at bedtime. If you need dental work, please let the doctor know before you go for your appointment so that we can coordinate the best possible time for you in regards to your chemo regimen. You need to also let your dentist know that you are actively taking chemo. We may need to do labs prior to your dental appointment.  Skin Care Always use  sunscreen that  has not expired and with SPF (Sun Protection Factor) of 50 or higher. Wear hats to protect your head from the sun. Remember to use sunscreen on your hands, ears, face, & feet.  Use good moisturizing lotions such as udder cream, eucerin, or even Vaseline. Some chemotherapies can cause dry skin, color changes in your skin and nails.    Avoid long, hot showers or baths. Use gentle, fragrance-free soaps and laundry detergent. Use moisturizers, preferably creams or ointments rather than lotions because the thicker consistency is better at preventing skin dehydration. Apply the cream or ointment within 15 minutes of showering. Reapply moisturizer at night, and moisturize your hands every time after you wash them.  Hair Loss (if your doctor says your hair will fall out)  If your doctor says that your hair is likely to fall out, decide before you begin chemo whether you want to wear a wig. You may want to shop before treatment to match your hair color. Hats, turbans, and scarves can also camouflage hair loss, although some people prefer to leave their heads uncovered. If you go bare-headed outdoors, be sure to use sunscreen on your scalp. Cut your hair short. It eases the inconvenience of shedding lots of hair, but it also can reduce the emotional impact of watching your hair fall out. Don't perm or color your hair during chemotherapy. Those chemical treatments are already damaging to hair and can enhance hair loss. Once your chemo treatments are done and your hair has grown back, it's OK to resume dyeing or perming hair.  With chemotherapy, hair loss is almost always temporary. But when it grows back, it may be a different color or texture. In older adults who still had hair color before chemotherapy, the new growth may be completely gray.  Often, new hair is very fine and soft.  Infection Prevention Please wash your hands for at least 30 seconds using warm soapy water. Handwashing is the #1  way to prevent the spread of germs. Stay away from sick people or people who are getting over a cold. If you develop respiratory systems such as green/yellow mucus production or productive cough or persistent cough let us know and we will see if you need an antibiotic. It is a good idea to keep a pair of gloves on when going into grocery stores/[employer] to decrease your risk of coming into contact with germs on the carts, etc. Carry alcohol hand gel with you at all times and use it frequently if out in public. If your temperature reaches 100.5 or higher please call the clinic and let us know.  If it is after hours or on the weekend please go to the ER if your temperature is over 100.5.  Please have your own personal thermometer at home to use.    Sex and bodily fluids If you are going to have sex, a condom must be used to protect the person that isn't taking chemotherapy. Chemo can decrease your libido (sex drive). For a few days after chemotherapy, chemotherapy can be excreted through your bodily fluids.  When using the toilet please close the lid and flush the toilet twice.  Do this for a few day after you have had chemotherapy.   Effects of chemotherapy on your sex life Some changes are simple and won't last long. They won't affect your sex life permanently.  Sometimes you may feel: too tired not strong enough to be very active sick or sore  not in the mood anxious or  low Your anxiety might not seem related to sex. For example, you may be worried about the cancer and how your treatment is going. Or you may be worried about money, or about how you family are coping with your illness.  These things can cause stress, which can affect your interest in sex. It's important to talk to your partner about how you feel.  Remember - the changes to your sex life don't usually last long. There's usually no medical reason to stop having sex during chemo. The drugs won't have any long term physical effects on your  performance or enjoyment of sex. Cancer can't be passed on to your partner during sex  Contraception It's important to use reliable contraception during treatment. Avoid getting pregnant while you or your partner are having chemotherapy. This is because the drugs may harm the baby. Sometimes chemotherapy drugs can leave a man or woman infertile.  This means you would not be able to have children in the future. You might want to talk to someone about permanent infertility. It can be very difficult to learn that you may no longer be able to have children. Some people find counselling helpful. There might be ways to preserve your fertility, although this is easier for men than for women. You may want to speak to a fertility expert. You can talk about sperm banking or harvesting your eggs. You can also ask about other fertility options, such as donor eggs. If you have or have had breast cancer, your doctor might advise you not to take the contraceptive pill. This is because the hormones in it might affect the cancer. It is not known for sure whether or not chemotherapy drugs can be passed on through semen or secretions from the vagina. Because of this some doctors advise people to use a barrier method if you have sex during treatment. This applies to vaginal, anal or oral sex. Generally, doctors advise a barrier method only for the time you are actually having the treatment and for about a week after your treatment. Advice like this can be worrying, but this does not mean that you have to avoid being intimate with your partner. You can still have close contact with your partner and continue to enjoy sex.  Animals If you have cats or birds we just ask that you not change the litter or change the cage.  Please have someone else do this for you while you are on chemotherapy.   Food Safety During and After Cancer Treatment Food safety is important for people both during and after cancer treatment. Cancer and cancer  treatments, such as chemotherapy, radiation therapy, and stem cell/bone marrow transplantation, often weaken the immune system. This makes it harder for your body to protect itself from foodborne illness, also called food poisoning. Foodborne illness is caused by eating food that contains harmful bacteria, parasites, or viruses.  Foods to avoid Some foods have a higher risk of becoming tainted with bacteria. These include: Unwashed fresh fruit and vegetables, especially leafy vegetables that can hide dirt and other contaminants Raw sprouts, such as alfalfa sprouts Raw or undercooked beef, especially ground beef, or other raw or undercooked meat and poultry Fatty, fried, or spicy foods immediately before or after treatment.  These can sit heavy on your stomach and make you feel nauseous. Raw or undercooked shellfish, such as oysters. Sushi and sashimi, which often contain raw fish.  Unpasteurized beverages, such as unpasteurized fruit juices, raw milk, raw yogurt, or cider Undercooked  eggs, such as soft boiled, over easy, and poached; raw, unpasteurized eggs; or foods made with raw egg, such as homemade raw cookie dough and homemade mayonnaise  Simple steps for food safety  Shop smart. Do not buy food stored or displayed in an unclean area. Do not buy bruised or damaged fruits or vegetables. Do not buy cans that have cracks, dents, or bulges. Pick up foods that can spoil at the end of your shopping trip and store them in a cooler on the way home.  Prepare and clean up foods carefully. Rinse all fresh fruits and vegetables under running water, and dry them with a clean towel or paper towel. Clean the top of cans before opening them. After preparing food, wash your hands for 20 seconds with hot water and soap. Pay special attention to areas between fingers and under nails. Clean your utensils and dishes with hot water and soap. Disinfect your kitchen and cutting boards using 1 teaspoon of  liquid, unscented bleach mixed into 1 quart of water.    Dispose of old food. Eat canned and packaged food before its expiration date (the "use by" or "best before" date). Consume refrigerated leftovers within 3 to 4 days. After that time, throw out the food. Even if the food does not smell or look spoiled, it still may be unsafe. Some bacteria, such as Listeria, can grow even on foods stored in the refrigerator if they are kept for too long.  Take precautions when eating out. At restaurants, avoid buffets and salad bars where food sits out for a long time and comes in contact with many people. Food can become contaminated when someone with a virus, often a norovirus, or another "bug" handles it. Put any leftover food in a "to-go" container yourself, rather than having the server do it. And, refrigerate leftovers as soon as you get home. Choose restaurants that are clean and that are willing to prepare your food as you order it cooked.   AT HOME MEDICATIONS:                                                                                                                                                                Compazine/Prochlorperazine '10mg'$  tablet. Take 1 tablet every 6 hours as needed for nausea/vomiting. (This can make you sleepy)   EMLA cream. Apply a quarter size amount to port site 1 hour prior to chemo. Do not rub in. Cover with plastic wrap.    Diarrhea Sheet   If you are having loose stools/diarrhea, please purchase Imodium and begin taking as outlined:  At the first sign of poorly formed or loose stools you should begin taking Imodium (loperamide) 2 mg capsules.  Take two tablets ('4mg'$ ) followed by one tablet ('2mg'$ ) every 2 hours -  DO NOT EXCEED 8 tablets in 24 hours.  If it is bedtime and you are having loose stools, take 2 tablets at bedtime, then 2 tablets every 4 hours until morning.   Always call the [facility] if you are having loose stools/diarrhea that you can't get  under control.  Loose stools/diarrhea leads to dehydration (loss of water) in your body.  We have other options of trying to get the loose stools/diarrhea to stop but you must let us know!   Constipation Sheet  Colace - 100 mg capsules - take 2 capsules daily.  If this doesn't help then you can increase to 2 capsules twice daily.  Please call if the above does not work for you. Do not go more than 2 days without a bowel movement.  It is very important that you do not become constipated.  It will make you feel sick to your stomach (nausea) and can cause abdominal pain and vomiting.  Nausea Sheet   Compazine/Prochlorperazine '10mg'$  tablet. Take 1 tablet every 6 hours as needed for nausea/vomiting (This can make you drowsy).  If you are having persistent nausea (nausea that does not stop) please call the [facility] and let us know the amount of nausea that you are experiencing.  If you begin to vomit, you need to call the [facility] and if it is the weekend and you have vomited more than one time and can't get it to stop-go to the Emergency Room.  Persistent nausea/vomiting can lead to dehydration (loss of fluid in your body) and will make you feel very weak and unwell. Ice chips, sips of clear liquids, foods that are at room temperature, crackers, and toast tend to be better tolerated.   SYMPTOMS TO REPORT AS SOON AS POSSIBLE AFTER TREATMENT:  FEVER GREATER THAN 100.4 F  CHILLS WITH OR WITHOUT FEVER  NAUSEA AND VOMITING THAT IS NOT CONTROLLED WITH YOUR NAUSEA MEDICATION  UNUSUAL SHORTNESS OF BREATH  UNUSUAL BRUISING OR BLEEDING  TENDERNESS IN MOUTH AND THROAT WITH OR WITHOUT   PRESENCE OF ULCERS  URINARY PROBLEMS  BOWEL PROBLEMS  UNUSUAL RASH      Wear comfortable clothing and clothing appropriate for easy access to any Portacath or PICC line. Let us know if there is anything that we can do to make your therapy better!    What to do if you need assistance after hours or  on the weekends: CALL [phone].  HOLD on the line, do not hang up.  You will hear multiple messages but at the end you will be connected with a nurse triage line.  They will contact the doctor if necessary.  Most of the time they will be able to assist you.  Do not call the hospital operator.      I have been informed and understand all of the instructions given to me and have received a copy. I have been instructed to call the clinic [phone] or my family physician as soon as possible for continued medical care, if indicated. I do not have any more questions at this time but understand that I may call the [facility] or the Patient Navigator at [phone] during office hours should I have questions or need assistance in obtaining follow-up care.

## 2022-02-10 NOTE — Procedures (Signed)
Interventional Radiology Procedure Note  Procedure: Chest port  Indication: Rectal Ca  Findings: Please refer to procedural dictation for full description.  Complications: None  EBL: < 10 mL  Miachel Roux, MD [phone]

## 2022-02-11 NOTE — Progress Notes (Signed)
Pharmacist Chemotherapy Monitoring - Initial Assessment    Anticipated start date: 02/16/22   The following has been reviewed per standard work regarding the patient's treatment regimen: The patient's diagnosis, treatment plan and drug doses, and organ/hematologic function Lab orders and baseline tests specific to treatment regimen  The treatment plan start date, drug sequencing, and pre-medications Prior authorization status  Patient's documented medication list, including drug-drug interaction screen and prescriptions for anti-emetics and supportive care specific to the treatment regimen The drug concentrations, fluid compatibility, administration routes, and timing of the medications to be used The patient's access for treatment and lifetime cumulative dose history, if applicable  The patient's medication allergies and previous infusion related reactions, if applicable   Changes made to treatment plan:  treatment plan date  Follow up needed:  N/A   Wynona Neat, [employer], 02/11/2022  11:27 AM

## 2022-02-12 ENCOUNTER — Ambulatory Visit: Payer: Medicaid Other | Admitting: Orthopedic Surgery

## 2022-02-12 ENCOUNTER — Encounter: Payer: Self-pay | Admitting: Orthopedic Surgery

## 2022-02-12 ENCOUNTER — Inpatient Hospital Stay: Payer: Medicaid Other | Attending: Hematology

## 2022-02-12 ENCOUNTER — Ambulatory Visit (INDEPENDENT_AMBULATORY_CARE_PROVIDER_SITE_OTHER): Payer: Medicaid Other

## 2022-02-12 VITALS — BP 146/93 | HR 75 | Ht 71.0 in | Wt 236.0 lb

## 2022-02-12 DIAGNOSIS — M25572 Pain in left ankle and joints of left foot: Secondary | ICD-10-CM

## 2022-02-12 DIAGNOSIS — M19072 Primary osteoarthritis, left ankle and foot: Secondary | ICD-10-CM | POA: Diagnosis not present

## 2022-02-12 DIAGNOSIS — C20 Malignant neoplasm of rectum: Secondary | ICD-10-CM | POA: Insufficient documentation

## 2022-02-12 DIAGNOSIS — Z5111 Encounter for antineoplastic chemotherapy: Secondary | ICD-10-CM | POA: Insufficient documentation

## 2022-02-12 MED ORDER — MELOXICAM 7.5 MG PO TABS: 7.5000 mg | ORAL_TABLET | Freq: Every day | ORAL | 5 refills | Status: DC
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
## 2022-02-12 NOTE — Progress Notes (Signed)

## 2022-02-16 ENCOUNTER — Inpatient Hospital Stay: Payer: Medicaid Other

## 2022-02-16 VITALS — BP 132/86 | HR 80 | Temp 97.8°F | Resp 18 | Wt 239.6 lb

## 2022-02-16 DIAGNOSIS — C20 Malignant neoplasm of rectum: Secondary | ICD-10-CM

## 2022-02-16 DIAGNOSIS — Z5111 Encounter for antineoplastic chemotherapy: Secondary | ICD-10-CM | POA: Diagnosis not present

## 2022-02-16 LAB — CBC WITH DIFFERENTIAL/PLATELET
Abs Immature Granulocytes: 0.02 10*3/uL (ref 0.00–0.07)
Basophils Absolute: 0.1 10*3/uL (ref 0.0–0.1)
Basophils Relative: 1 %
Eosinophils Absolute: 0.7 10*3/uL — ABNORMAL HIGH (ref 0.0–0.5)
Eosinophils Relative: 10 %
HCT: 40.3 % (ref 39.0–52.0)
Hemoglobin: 14.1 g/dL (ref 13.0–17.0)
Immature Granulocytes: 0 %
Lymphocytes Relative: 23 %
Lymphs Abs: 1.7 10*3/uL (ref 0.7–4.0)
MCH: 29.6 pg (ref 26.0–34.0)
MCHC: 35 g/dL (ref 30.0–36.0)
MCV: 84.7 fL (ref 80.0–100.0)
Monocytes Absolute: 0.7 10*3/uL (ref 0.1–1.0)
Monocytes Relative: 10 %
Neutro Abs: 4.2 10*3/uL (ref 1.7–7.7)
Neutrophils Relative %: 56 %
Platelets: 298 10*3/uL (ref 150–400)
RBC: 4.76 MIL/uL (ref 4.22–5.81)
RDW: 12.2 % (ref 11.5–15.5)
WBC: 7.5 10*3/uL (ref 4.0–10.5)
nRBC: 0 % (ref 0.0–0.2)

## 2022-02-16 LAB — COMPREHENSIVE METABOLIC PANEL
ALT: 28 U/L (ref 0–44)
AST: 20 U/L (ref 15–41)
Albumin: 4 g/dL (ref 3.5–5.0)
Alkaline Phosphatase: 73 U/L (ref 38–126)
Anion gap: 9 (ref 5–15)
BUN: 11 mg/dL (ref 6–20)
CO2: 26 mmol/L (ref 22–32)
Calcium: 9.2 mg/dL (ref 8.9–10.3)
Chloride: 100 mmol/L (ref 98–111)
Creatinine, Ser: 0.78 mg/dL (ref 0.61–1.24)
GFR, Estimated: >60 mL/min (ref >60)
Glucose, Bld: 233 mg/dL — ABNORMAL HIGH (ref 70–99)
Potassium: 3.8 mmol/L (ref 3.5–5.1)
Sodium: 135 mmol/L (ref 135–145)
Total Bilirubin: 0.7 mg/dL (ref 0.3–1.2)
Total Protein: 6.9 g/dL (ref 6.5–8.1)

## 2022-02-16 LAB — MAGNESIUM: Magnesium: 2 mg/dL (ref 1.7–2.4)

## 2022-02-16 MED ORDER — FLUOROURACIL CHEMO INJECTION 2.5 GM/50ML
400.0000 mg/m2 | Freq: Once | INTRAVENOUS | Status: AC
  Administered 2022-02-16: 900 mg via INTRAVENOUS
  Filled 2022-02-16: qty 18

## 2022-02-16 MED ORDER — OXALIPLATIN CHEMO INJECTION 100 MG/20ML
85.0000 mg/m2 | Freq: Once | INTRAVENOUS | Status: AC
  Administered 2022-02-16: 195 mg via INTRAVENOUS
  Filled 2022-02-16: qty 39

## 2022-02-16 MED ORDER — PALONOSETRON HCL INJECTION 0.25 MG/5ML
0.2500 mg | Freq: Once | INTRAVENOUS | Status: AC
  Administered 2022-02-16: 0.25 mg via INTRAVENOUS
  Filled 2022-02-16: qty 5

## 2022-02-16 MED ORDER — LEUCOVORIN CALCIUM INJECTION 350 MG
400.0000 mg/m2 | Freq: Once | INTRAVENOUS | Status: AC
  Administered 2022-02-16: 924 mg via INTRAVENOUS
  Filled 2022-02-16: qty 46.2

## 2022-02-16 MED ORDER — SODIUM CHLORIDE 0.9 % IV SOLN
10.0000 mg | Freq: Once | INTRAVENOUS | Status: AC
  Administered 2022-02-16: 10 mg via INTRAVENOUS
  Filled 2022-02-16: qty 10

## 2022-02-16 MED ORDER — DEXTROSE 5 % IV SOLN: Freq: Once | INTRAVENOUS | Status: AC

## 2022-02-16 MED ORDER — SODIUM CHLORIDE 0.9 % IV SOLN
2400.0000 mg/m2 | INTRAVENOUS | Status: DC
  Administered 2022-02-16: 5550 mg via INTRAVENOUS
  Filled 2022-02-16: qty 111

## 2022-02-16 NOTE — Progress Notes (Signed)
Patients port flushed without difficulty.  Good blood return noted with no bruising or swelling noted at site.  Stable during access and blood draw.  Patient to remain accessed for treatment. 

## 2022-02-16 NOTE — Progress Notes (Signed)
Pt presents today for C1D1 Folfox per provider's order. Vital signs and labs WNL for treatment. Okay to proceed with treatment today.  Pt educated and consent signed on pump Infusystem. Pt verbalized understanding and advised to call the clinic for questions if needed.   C1D1 Folfox given today per MD orders. Tolerated infusion without adverse affects. Vital signs stable. No complaints at this time. Discharged from clinic ambulatory in stable condition. Alert and oriented x 3. F/U with [facility] as scheduled.  5FU ambulatory pump infusing.

## 2022-02-16 NOTE — Patient Instructions (Signed)
[facility]  Discharge Instructions: Thank you for choosing [facility] to provide your oncology and hematology care.  If you have a lab appointment with the [facility], please come in thru the Main Entrance and check in at the main information desk.  Wear comfortable clothing and clothing appropriate for easy access to any Portacath or PICC line.   We strive to give you quality time with your provider. You may need to reschedule your appointment if you arrive late (15 or more minutes).  Arriving late affects you and other patients whose appointments are after yours.  Also, if you miss three or more appointments without notifying the office, you may be dismissed from the clinic at the provider's discretion.      For prescription refill requests, have your pharmacy contact our office and allow 72 hours for refills to be completed.    Today you received the following chemotherapy and/or immunotherapy agents FOLFOX   To help prevent nausea and vomiting after your treatment, we encourage you to take your nausea medication as directed.  Oxaliplatin Injection What is this medication? OXALIPLATIN (ox AL i PLA tin) treats some types of cancer. It works by slowing down the growth of cancer cells. This medicine may be used for other purposes; ask your health care provider or pharmacist if you have questions. COMMON BRAND NAME(S): Eloxatin What should I tell my care team before I take this medication? They need to know if you have any of these conditions: Heart disease History of irregular heartbeat or rhythm Liver disease Low blood cell levels (white cells, red cells, and platelets) Lung or breathing disease, such as asthma Take medications that treat or prevent blood clots Tingling of the fingers, toes, or other nerve disorder An unusual or allergic reaction to oxaliplatin, other medications, foods, dyes, or preservatives If you or your partner are pregnant  or trying to get pregnant Breast-feeding How should I use this medication? This medication is injected into a vein. It is given by your care team in a hospital or clinic setting. Talk to your care team about the use of this medication in children. Special care may be needed. Overdosage: If you think you have taken too much of this medicine contact a poison control center or emergency room at once. NOTE: This medicine is only for you. Do not share this medicine with others. What if I miss a dose? Keep appointments for follow-up doses. It is important not to miss a dose. Call your care team if you are unable to keep an appointment. What may interact with this medication? Do not take this medication with any of the following: Cisapride Dronedarone Pimozide Thioridazine This medication may also interact with the following: Aspirin and aspirin-like medications Certain medications that treat or prevent blood clots, such as warfarin, apixaban, dabigatran, and rivaroxaban Cisplatin Cyclosporine Diuretics Medications for infection, such as acyclovir, adefovir, amphotericin B, bacitracin, cidofovir, foscarnet, ganciclovir, gentamicin, pentamidine, vancomycin NSAIDs, medications for pain and inflammation, such as ibuprofen or naproxen Other medications that cause heart rhythm changes Pamidronate Zoledronic acid This list may not describe all possible interactions. Give your health care provider a list of all the medicines, herbs, non-prescription drugs, or dietary supplements you use. Also tell them if you smoke, drink alcohol, or use illegal drugs. Some items may interact with your medicine. What should I watch for while using this medication? Your condition will be monitored carefully while you are receiving this medication. You may need  need blood work while taking this medication. This medication may make you feel generally unwell. This is not uncommon as chemotherapy can affect healthy  cells as well as cancer cells. Report any side effects. Continue your course of treatment even though you feel ill unless your care team tells you to stop. This medication may increase your risk of getting an infection. Call your care team for advice if you get a fever, chills, sore throat, or other symptoms of a cold or flu. Do not treat yourself. Try to avoid being around people who are sick. Avoid taking medications that contain aspirin, acetaminophen, ibuprofen, naproxen, or ketoprofen unless instructed by your care team. These medications may hide a fever. Be careful brushing or flossing your teeth or using a toothpick because you may get an infection or bleed more easily. If you have any dental work done, tell your dentist you are receiving this medication. This medication can make you more sensitive to cold. Do not drink cold drinks or use ice. Cover exposed skin before coming in contact with cold temperatures or cold objects. When out in cold weather wear warm clothing and cover your mouth and nose to warm the air that goes into your lungs. Tell your care team if you get sensitive to the cold. Talk to your care team if you or your partner are pregnant or think either of you might be pregnant. This medication can cause serious birth defects if taken during pregnancy and for 9 months after the last dose. A negative pregnancy test is required before starting this medication. A reliable form of contraception is recommended while taking this medication and for 9 months after the last dose. Talk to your care team about effective forms of contraception. Do not father a child while taking this medication and for 6 months after the last dose. Use a condom while having sex during this time period. Do not breastfeed while taking this medication and for 3 months after the last dose. This medication may cause infertility. Talk to your care team if you are concerned about your fertility. What side effects may I  notice from receiving this medication? Side effects that you should report to your care team as soon as possible: Allergic reactions--skin rash, itching, hives, swelling of the face, lips, tongue, or throat Bleeding--bloody or black, tar-like stools, vomiting blood or brown material that looks like coffee grounds, red or dark brown urine, small red or purple spots on skin, unusual bruising or bleeding Dry cough, shortness of breath or trouble breathing Heart rhythm changes--fast or irregular heartbeat, dizziness, feeling faint or lightheaded, chest pain, trouble breathing Infection--fever, chills, cough, sore throat, wounds that don't heal, pain or trouble when passing urine, general feeling of discomfort or being unwell Liver injury--right upper belly pain, loss of appetite, nausea, light-colored stool, dark yellow or brown urine, yellowing skin or eyes, unusual weakness or fatigue Low red blood cell level--unusual weakness or fatigue, dizziness, headache, trouble breathing Muscle injury--unusual weakness or fatigue, muscle pain, dark yellow or brown urine, decrease in amount of urine Pain, tingling, or numbness in the hands or feet Sudden and severe headache, confusion, change in vision, seizures, which may be signs of posterior reversible encephalopathy syndrome (PRES) Unusual bruising or bleeding Side effects that usually do not require medical attention (report to your care team if they continue or are bothersome): Diarrhea Nausea Pain, redness, or swelling with sores inside the mouth or throat Unusual weakness or fatigue Vomiting This list may not describe   possible side effects. Call your doctor for medical advice about side effects. You may report side effects to FDA at 1-800-FDA-1088. Where should I keep my medication? This medication is given in a hospital or clinic. It will not be stored at home. NOTE: This sheet is a summary. It may not cover all possible information. If you have questions  about this medicine, talk to your doctor, pharmacist, or health care provider.  2023 Elsevier/Gold Standard (2021-08-22 00:00:00)  Fluorouracil Injection What is this medication? FLUOROURACIL (flure oh YOOR a sil) treats some types of cancer. It works by slowing down the growth of cancer cells. This medicine may be used for other purposes; ask your health care provider or pharmacist if you have questions. COMMON BRAND NAME(S): Adrucil What should I tell my care team before I take this medication? They need to know if you have any of these conditions: Blood disorders Dihydropyrimidine dehydrogenase (DPD) deficiency Infection, such as chickenpox, cold sores, herpes Kidney disease Liver disease Poor nutrition Recent or ongoing radiation therapy An unusual or allergic reaction to fluorouracil, other medications, foods, dyes, or preservatives If you or your partner are pregnant or trying to get pregnant Breast-feeding How should I use this medication? This medication is injected into a vein. It is administered by your care team in a hospital or clinic setting. Talk to your care team about the use of this medication in children. Special care may be needed. Overdosage: If you think you have taken too much of this medicine contact a poison control center or emergency room at once. NOTE: This medicine is only for you. Do not share this medicine with others. What if I miss a dose? Keep appointments for follow-up doses. It is important not to miss your dose. Call your care team if you are unable to keep an appointment. What may interact with this medication? Do not take this medication with any of the following: Live virus vaccines This medication may also interact with the following: Medications that treat or prevent blood clots, such as warfarin, enoxaparin, dalteparin This list may not describe all possible interactions. Give your health care provider a list of all the medicines, herbs,  non-prescription drugs, or dietary supplements you use. Also tell them if you smoke, drink alcohol, or use illegal drugs. Some items may interact with your medicine. What should I watch for while using this medication? Your condition will be monitored carefully while you are receiving this medication. This medication may make you feel generally unwell. This is not uncommon as chemotherapy can affect healthy cells as well as cancer cells. Report any side effects. Continue your course of treatment even though you feel ill unless your care team tells you to stop. In some cases, you may be given additional medications to help with side effects. Follow all directions for their use. This medication may increase your risk of getting an infection. Call your care team for advice if you get a fever, chills, sore throat, or other symptoms of a cold or flu. Do not treat yourself. Try to avoid being around people who are sick. This medication may increase your risk to bruise or bleed. Call your care team if you notice any unusual bleeding. Be careful brushing or flossing your teeth or using a toothpick because you may get an infection or bleed more easily. If you have any dental work done, tell your dentist you are receiving this medication. Avoid taking medications that contain aspirin, acetaminophen, ibuprofen, naproxen, or ketoprofen unless instructed  by your care team. These medications may hide a fever. Do not treat diarrhea with over the counter products. Contact your care team if you have diarrhea that lasts more than 2 days or if it is severe and watery. This medication can make you more sensitive to the sun. Keep out of the sun. If you cannot avoid being in the sun, wear protective clothing and sunscreen. Do not use sun lamps, tanning beds, or tanning booths. Talk to your care team if you or your partner wish to become pregnant or think you might be pregnant. This medication can cause serious birth defects if  taken during pregnancy and for 3 months after the last dose. A reliable form of contraception is recommended while taking this medication and for 3 months after the last dose. Talk to your care team about effective forms of contraception. Do not father a child while taking this medication and for 3 months after the last dose. Use a condom while having sex during this time period. Do not breastfeed while taking this medication. This medication may cause infertility. Talk to your care team if you are concerned about your fertility. What side effects may I notice from receiving this medication? Side effects that you should report to your care team as soon as possible: Allergic reactions--skin rash, itching, hives, swelling of the face, lips, tongue, or throat Heart attack--pain or tightness in the chest, shoulders, arms, or jaw, nausea, shortness of breath, cold or clammy skin, feeling faint or lightheaded Heart failure--shortness of breath, swelling of the ankles, feet, or hands, sudden weight gain, unusual weakness or fatigue Heart rhythm changes--fast or irregular heartbeat, dizziness, feeling faint or lightheaded, chest pain, trouble breathing High ammonia level--unusual weakness or fatigue, confusion, loss of appetite, nausea, vomiting, seizures Infection--fever, chills, cough, sore throat, wounds that don't heal, pain or trouble when passing urine, general feeling of discomfort or being unwell Low red blood cell level--unusual weakness or fatigue, dizziness, headache, trouble breathing Pain, tingling, or numbness in the hands or feet, muscle weakness, change in vision, confusion or trouble speaking, loss of balance or coordination, trouble walking, seizures Redness, swelling, and blistering of the skin over hands and feet Severe or prolonged diarrhea Unusual bruising or bleeding Side effects that usually do not require medical attention (report to your care team if they continue or are  bothersome): Dry skin Headache Increased tears Nausea Pain, redness, or swelling with sores inside the mouth or throat Sensitivity to light Vomiting This list may not describe all possible side effects. Call your doctor for medical advice about side effects. You may report side effects to FDA at 1-800-FDA-1088. Where should I keep my medication? This medication is given in a hospital or clinic. It will not be stored at home. NOTE: This sheet is a summary. It may not cover all possible information. If you have questions about this medicine, talk to your doctor, pharmacist, or health care provider.  2023 Elsevier/Gold Standard (2021-09-02 00:00:00) Leucovorin Injection What is this medication? LEUCOVORIN (loo koe VOR in) prevents side effects from certain medications, such as methotrexate. It works by increasing folate levels. This helps protect healthy cells in your body. It may also be used to treat anemia caused by low levels of folate. It can also be used with fluorouracil, a type of chemotherapy, to treat colorectal cancer. It works by increasing the effects of fluorouracil in the body. This medicine may be used for other purposes; ask your health care provider or pharmacist if you  have questions. What should I tell my care team before I take this medication? They need to know if you have any of these conditions: Anemia from low levels of vitamin B12 in the blood An unusual or allergic reaction to leucovorin, folic acid, other medications, foods, dyes, or preservatives Pregnant or trying to get pregnant Breastfeeding How should I use this medication? This medication is injected into a vein or a muscle. It is given by your care team in a hospital or clinic setting. Talk to your care team about the use of this medication in children. Special care may be needed. Overdosage: If you think you have taken too much of this medicine contact a poison control center or emergency room at  once. NOTE: This medicine is only for you. Do not share this medicine with others. What if I miss a dose? Keep appointments for follow-up doses. It is important not to miss your dose. Call your care team if you are unable to keep an appointment. What may interact with this medication? Capecitabine Fluorouracil Phenobarbital Phenytoin Primidone Trimethoprim;sulfamethoxazole This list may not describe all possible interactions. Give your health care provider a list of all the medicines, herbs, non-prescription drugs, or dietary supplements you use. Also tell them if you smoke, drink alcohol, or use illegal drugs. Some items may interact with your medicine. What should I watch for while using this medication? Your condition will be monitored carefully while you are receiving this medication. This medication may increase the side effects of 5-fluorouracil. Tell your care team if you have diarrhea or mouth sores that do not get better or that get worse. What side effects may I notice from receiving this medication? Side effects that you should report to your care team as soon as possible: Allergic reactions--skin rash, itching, hives, swelling of the face, lips, tongue, or throat This list may not describe all possible side effects. Call your doctor for medical advice about side effects. You may report side effects to FDA at 1-800-FDA-1088. Where should I keep my medication? This medication is given in a hospital or clinic. It will not be stored at home. NOTE: This sheet is a summary. It may not cover all possible information. If you have questions about this medicine, talk to your doctor, pharmacist, or health care provider.  2023 Elsevier/Gold Standard (2021-09-05 00:00:00)    BELOW ARE SYMPTOMS THAT SHOULD BE REPORTED IMMEDIATELY: *FEVER GREATER THAN 100.4 F (38 C) OR HIGHER *CHILLS OR SWEATING *NAUSEA AND VOMITING THAT IS NOT CONTROLLED WITH YOUR NAUSEA MEDICATION *UNUSUAL SHORTNESS OF  BREATH *UNUSUAL BRUISING OR BLEEDING *URINARY PROBLEMS (pain or burning when urinating, or frequent urination) *BOWEL PROBLEMS (unusual diarrhea, constipation, pain near the anus) TENDERNESS IN MOUTH AND THROAT WITH OR WITHOUT PRESENCE OF ULCERS (sore throat, sores in mouth, or a toothache) UNUSUAL RASH, SWELLING OR PAIN  UNUSUAL VAGINAL DISCHARGE OR ITCHING   Items with * indicate a potential emergency and should be followed up as soon as possible or go to the Emergency Department if any problems should occur.  Please show the CHEMOTHERAPY ALERT CARD or IMMUNOTHERAPY ALERT CARD at check-in to the Emergency Department and triage nurse.  Should you have questions after your visit or need to cancel or reschedule your appointment, please contact [facility] [phone]  and follow the prompts.  Office hours are 8:00 a.m. to 4:30 p.m. Monday - Friday. Please note that voicemails left after 4:00 p.m. may not be returned until the following business day.  We are closed weekends and major holidays. You have access to a nurse at all times for urgent questions. Please call the main number to the clinic [phone] and follow the prompts.  For any non-urgent questions, you may also contact your provider using MyChart. We now offer e-Visits for anyone 65 and older to request care online for non-urgent symptoms. For details visit [email or web address].   Also download the MyChart app! Go to the app store, search "MyChart", open the app, select [facility], and log in with your MyChart username and password.  Masks are optional in the cancer centers. If you would like for your care team to wear a mask while they are taking care of you, please let them know. You may have one support person who is at least 45 years old accompany you for your appointments.

## 2022-02-18 ENCOUNTER — Inpatient Hospital Stay: Payer: Medicaid Other

## 2022-02-18 VITALS — BP 125/84 | HR 87 | Temp 97.2°F | Resp 20

## 2022-02-18 DIAGNOSIS — C20 Malignant neoplasm of rectum: Secondary | ICD-10-CM | POA: Diagnosis not present

## 2022-02-18 DIAGNOSIS — Z5111 Encounter for antineoplastic chemotherapy: Secondary | ICD-10-CM | POA: Diagnosis not present

## 2022-02-18 MED ORDER — HEPARIN SOD (PORK) LOCK FLUSH 100 UNIT/ML IV SOLN
500.0000 [IU] | Freq: Once | INTRAVENOUS | Status: AC | PRN
  Administered 2022-02-18: 500 [IU]

## 2022-02-18 MED ORDER — SODIUM CHLORIDE 0.9% FLUSH
10.0000 mL | INTRAVENOUS | Status: DC | PRN
  Administered 2022-02-18: 10 mL

## 2022-02-18 NOTE — Progress Notes (Signed)
Patient for chemotherapy pump disconnect with no complaints voiced.  Patients port flushed without difficulty.  Good blood return noted with no bruising or swelling noted at site.  Band aid applied.  VSS with discharge and left ambulatory with no s/s of distress noted.   

## 2022-02-18 NOTE — Patient Instructions (Signed)
[facility]  Discharge Instructions: Thank you for choosing [facility] to provide your oncology and hematology care.  If you have a lab appointment with the Cancer Center, please come in thru the Main Entrance and check in at the main information desk.  Wear comfortable clothing and clothing appropriate for easy access to any Portacath or PICC line.   We strive to give you quality time with your provider. You may need to reschedule your appointment if you arrive late (15 or more minutes).  Arriving late affects you and other patients whose appointments are after yours.  Also, if you miss three or more appointments without notifying the office, you may be dismissed from the clinic at the provider's discretion.      For prescription refill requests, have your pharmacy contact our office and allow 72 hours for refills to be completed.     To help prevent nausea and vomiting after your treatment, we encourage you to take your nausea medication as directed.  BELOW ARE SYMPTOMS THAT SHOULD BE REPORTED IMMEDIATELY: *FEVER GREATER THAN 100.4 F (38 C) OR HIGHER *CHILLS OR SWEATING *NAUSEA AND VOMITING THAT IS NOT CONTROLLED WITH YOUR NAUSEA MEDICATION *UNUSUAL SHORTNESS OF BREATH *UNUSUAL BRUISING OR BLEEDING *URINARY PROBLEMS (pain or burning when urinating, or frequent urination) *BOWEL PROBLEMS (unusual diarrhea, constipation, pain near the anus) TENDERNESS IN MOUTH AND THROAT WITH OR WITHOUT PRESENCE OF ULCERS (sore throat, sores in mouth, or a toothache) UNUSUAL RASH, SWELLING OR PAIN  UNUSUAL VAGINAL DISCHARGE OR ITCHING   Items with * indicate a potential emergency and should be followed up as soon as possible or go to the Emergency Department if any problems should occur.  Please show the CHEMOTHERAPY ALERT CARD or IMMUNOTHERAPY ALERT CARD at check-in to the Emergency Department and triage nurse.  Should you have questions after your visit or need to  cancel or reschedule your appointment, please contact [facility] [phone]  and follow the prompts.  Office hours are 8:00 a.m. to 4:30 p.m. Monday - Friday. Please note that voicemails left after 4:00 p.m. may not be returned until the following business day.  We are closed weekends and major holidays. You have access to a nurse at all times for urgent questions. Please call the main number to the clinic [phone] and follow the prompts.  For any non-urgent questions, you may also contact your provider using MyChart. We now offer e-Visits for anyone 18 and older to request care online for non-urgent symptoms. For details visit [email or web address].   Also download the MyChart app! Go to the app store, search "MyChart", open the app, select [facility], and log in with your MyChart username and password.  Masks are optional in the cancer centers. If you would like for your care team to wear a mask while they are taking care of you, please let them know. You may have one support person who is at least 45 years old accompany you for your appointments.  

## 2022-02-19 ENCOUNTER — Telehealth: Payer: Self-pay

## 2022-02-19 NOTE — Telephone Encounter (Signed)
Patient called for 24 hr follow-up call. Patient reports feeling better than yesterday with some diarrhea. Patient advised to get imodium, and call cancer center if diarrhea worsens, patient verbalizes understanding.

## 2022-02-23 ENCOUNTER — Other Ambulatory Visit: Payer: Self-pay | Admitting: *Deleted

## 2022-02-23 ENCOUNTER — Telehealth: Payer: Self-pay

## 2022-02-23 DIAGNOSIS — R300 Dysuria: Secondary | ICD-10-CM

## 2022-02-23 DIAGNOSIS — R319 Hematuria, unspecified: Secondary | ICD-10-CM

## 2022-02-23 NOTE — Telephone Encounter (Signed)
Please advice on antibiotic?

## 2022-02-23 NOTE — Telephone Encounter (Signed)
Pt scheduled for tomorrow at 11:40

## 2022-02-23 NOTE — Telephone Encounter (Signed)
Pt states he had boil cut and needs antibiotic to help heal.

## 2022-02-23 NOTE — Telephone Encounter (Signed)
Can you get more information as to why he needs the antibiotic refill

## 2022-02-23 NOTE — Telephone Encounter (Signed)
Patient called said he does not know name of the medicine, he said the provider would know it is in his chart.  Need refill on antibiotic and  acetaminophen (TYLENOL) 500 MG tablet [[id number]  [facility] [place], [place] - [place] [address] [place], [place] [address]  Phone:  [phone]  Fax:  [phone]

## 2022-02-23 NOTE — Telephone Encounter (Signed)
Can you please asked the patient to schedule a televisit so I can better understand his complaints and the need for antibiotic treatment

## 2022-02-24 ENCOUNTER — Encounter: Payer: Self-pay | Admitting: Family Medicine

## 2022-02-24 ENCOUNTER — Ambulatory Visit: Payer: Medicaid Other | Attending: Internal Medicine | Admitting: Internal Medicine

## 2022-02-24 ENCOUNTER — Inpatient Hospital Stay: Payer: Medicaid Other

## 2022-02-24 ENCOUNTER — Encounter: Payer: Self-pay | Admitting: Internal Medicine

## 2022-02-24 ENCOUNTER — Ambulatory Visit: Payer: Medicaid Other | Admitting: Family Medicine

## 2022-02-24 VITALS — BP 116/76 | HR 86 | Ht 71.0 in | Wt 234.0 lb

## 2022-02-24 DIAGNOSIS — Z5111 Encounter for antineoplastic chemotherapy: Secondary | ICD-10-CM | POA: Diagnosis not present

## 2022-02-24 DIAGNOSIS — L02411 Cutaneous abscess of right axilla: Secondary | ICD-10-CM

## 2022-02-24 DIAGNOSIS — C20 Malignant neoplasm of rectum: Secondary | ICD-10-CM | POA: Diagnosis not present

## 2022-02-24 DIAGNOSIS — L02214 Cutaneous abscess of groin: Secondary | ICD-10-CM | POA: Diagnosis not present

## 2022-02-24 DIAGNOSIS — R319 Hematuria, unspecified: Secondary | ICD-10-CM

## 2022-02-24 DIAGNOSIS — I471 Supraventricular tachycardia, unspecified: Secondary | ICD-10-CM

## 2022-02-24 DIAGNOSIS — R300 Dysuria: Secondary | ICD-10-CM

## 2022-02-24 DIAGNOSIS — L02412 Cutaneous abscess of left axilla: Secondary | ICD-10-CM

## 2022-02-24 LAB — CBC WITH DIFFERENTIAL/PLATELET
Abs Immature Granulocytes: 0.04 10*3/uL (ref 0.00–0.07)
Basophils Absolute: 0.1 10*3/uL (ref 0.0–0.1)
Basophils Relative: 1 %
Eosinophils Absolute: 0.3 10*3/uL (ref 0.0–0.5)
Eosinophils Relative: 3 %
HCT: 41.5 % (ref 39.0–52.0)
Hemoglobin: 14.6 g/dL (ref 13.0–17.0)
Immature Granulocytes: 0 %
Lymphocytes Relative: 21 %
Lymphs Abs: 1.9 10*3/uL (ref 0.7–4.0)
MCH: 29.1 pg (ref 26.0–34.0)
MCHC: 35.2 g/dL (ref 30.0–36.0)
MCV: 82.7 fL (ref 80.0–100.0)
Monocytes Absolute: 0.9 10*3/uL (ref 0.1–1.0)
Monocytes Relative: 10 %
Neutro Abs: 5.9 10*3/uL (ref 1.7–7.7)
Neutrophils Relative %: 65 %
Platelets: 335 10*3/uL (ref 150–400)
RBC: 5.02 MIL/uL (ref 4.22–5.81)
RDW: 11.4 % — ABNORMAL LOW (ref 11.5–15.5)
WBC: 9.2 10*3/uL (ref 4.0–10.5)
nRBC: 0 % (ref 0.0–0.2)

## 2022-02-24 LAB — URINALYSIS, ROUTINE W REFLEX MICROSCOPIC
Bilirubin Urine: NEGATIVE
Glucose, UA: NEGATIVE mg/dL
Hgb urine dipstick: NEGATIVE
Ketones, ur: NEGATIVE mg/dL
Leukocytes,Ua: NEGATIVE
Nitrite: NEGATIVE
Protein, ur: NEGATIVE mg/dL
Specific Gravity, Urine: 1.006 (ref 1.005–1.030)
pH: 7 (ref 5.0–8.0)

## 2022-02-24 LAB — COMPREHENSIVE METABOLIC PANEL
ALT: 27 U/L (ref 0–44)
AST: 20 U/L (ref 15–41)
Albumin: 4.4 g/dL (ref 3.5–5.0)
Alkaline Phosphatase: 80 U/L (ref 38–126)
Anion gap: 9 (ref 5–15)
BUN: 11 mg/dL (ref 6–20)
CO2: 27 mmol/L (ref 22–32)
Calcium: 9.2 mg/dL (ref 8.9–10.3)
Chloride: 95 mmol/L — ABNORMAL LOW (ref 98–111)
Creatinine, Ser: 0.86 mg/dL (ref 0.61–1.24)
GFR, Estimated: >60 mL/min (ref >60)
Glucose, Bld: 142 mg/dL — ABNORMAL HIGH (ref 70–99)
Potassium: 3.7 mmol/L (ref 3.5–5.1)
Sodium: 131 mmol/L — ABNORMAL LOW (ref 135–145)
Total Bilirubin: 0.8 mg/dL (ref 0.3–1.2)
Total Protein: 7.7 g/dL (ref 6.5–8.1)

## 2022-02-24 MED ORDER — DOXYCYCLINE HYCLATE 100 MG PO TABS: 100.0000 mg | ORAL_TABLET | Freq: Two times a day (BID) | ORAL | 0 refills | Status: AC

## 2022-02-24 NOTE — Progress Notes (Signed)
HPI [name] returns today for followup. He is a pleasant 46 yo man with a h/o SVT who underwent EP study and ablation several months ago. He has done well in the interim with no chest pain, sob, or palpitations. He feels well. He wants to start back exercising and wanted to be sure it was ok.He denies any trouble with activity.  No Known Allergies   Current Outpatient Medications  Medication Sig Dispense Refill   Accu-Chek Softclix Lancets lancets 1 each by Other route 2 (two) times daily. Use as instructed 100 each 2   acetaminophen (TYLENOL) 500 MG tablet Take 1 tablet (500 mg total) by mouth every 6 (six) hours as needed. 30 tablet 0   blood glucose meter kit and supplies Dispense based on patient and insurance preference. Use up to four times daily as directed. (FOR ICD-10 E10.9, E11.9). 1 each 0   Blood Glucose Monitoring Suppl (ACCU-CHEK GUIDE ME) w/Device KIT 1 Piece by Does not apply route as directed. 1 kit 0   doxycycline (VIBRA-TABS) 100 MG tablet Take 1 tablet (100 mg total) by mouth 2 (two) times daily for 5 days. 10 tablet 0   fluorouracil CALGB 57972 2,400 mg/m2 in sodium chloride 0.9 % 150 mL Inject 2,400 mg/m2 into the vein over 48 hr.     FLUOROURACIL IV Inject into the vein every 14 (fourteen) days.     glipiZIDE (GLUCOTROL) 5 MG tablet Take 1 tablet (5 mg total) by mouth daily before breakfast. 90 tablet 1   glucose blood (ACCU-CHEK GUIDE) test strip Use to test glucose 2 times a day. 200 each 2   Insulin Pen Needle (PEN NEEDLES) 31G X 5 MM MISC 1 each by Does not apply route in the morning, at noon, in the evening, and at bedtime. 200 each 3   LEUCOVORIN CALCIUM IV Inject into the vein every 14 (fourteen) days.     lidocaine-prilocaine (EMLA) cream Apply a small amount to port a cath site and cover with plastic wrap 1 hour prior to infusion appointments 30 g 3   lisinopril-hydrochlorothiazide (ZESTORETIC) 20-25 MG tablet Take 1 tablet by mouth once daily 30 tablet 0    lisinopril-hydrochlorothiazide (ZESTORETIC) 20-25 MG tablet Take 1 tablet by mouth daily. 90 tablet 0   meloxicam (MOBIC) 7.5 MG tablet Take 1 tablet (7.5 mg total) by mouth daily. 30 tablet 5   metFORMIN (GLUCOPHAGE) 1000 MG tablet Take 1 tablet (1,000 mg total) by mouth 2 (two) times daily with a meal. 180 tablet 0   OXALIPLATIN IV Inject into the vein every 14 (fourteen) days.     pantoprazole (PROTONIX) 40 MG tablet Take 1 tablet (40 mg total) by mouth daily. 30 tablet 3   prochlorperazine (COMPAZINE) 10 MG tablet Take 1 tablet (10 mg total) by mouth every 6 (six) hours as needed for nausea or vomiting. 60 tablet 3   SEMGLEE, YFGN, 100 UNIT/ML Pen Inject into the skin.     simvastatin (ZOCOR) 40 MG tablet Take 1 tablet by mouth in the evening 90 tablet 0   No current facility-administered medications for this visit.     Past Medical History:  Diagnosis Date   Abscess, axilla 10/06/2016   Alcohol dependence ([place]) 10/06/2016   Current smoker 10/06/2016   Dyshidrotic eczema 10/27/2016   GERD (gastroesophageal reflux disease)    Hyperlipidemia    Hyperosmolar non-ketotic state in patient with type 2 diabetes mellitus ([place]) 10/06/2016   Hypertension  MRSA (methicillin resistant Staphylococcus aureus)    Skin abscess    Type 2 diabetes mellitus ([place])     ROS:   All systems reviewed and negative except as noted in the HPI.   Past Surgical History:  Procedure Laterality Date   ANKLE SURGERY     BIOPSY  01/06/2022   Procedure: BIOPSY;  Surgeon: Harvel Quale, MD;  [facility];  Service: Gastroenterology;;   COLONOSCOPY WITH PROPOFOL N/A 01/06/2022   Procedure: COLONOSCOPY WITH PROPOFOL;  Surgeon: Harvel Quale, MD;  [facility];  Service: Gastroenterology;  Laterality: N/A;  730   HAND SURGERY     boxer's fracture L hand   INCISION AND DRAINAGE PERIRECTAL ABSCESS     IR IMAGING GUIDED PORT INSERTION  02/10/2022   MYRINGOTOMY WITH  TUBE PLACEMENT Bilateral 07/22/2020   Procedure: BILATERAL MYRINGOTOMY WITH TUBE PLACEMENT;  Surgeon: Leta Baptist, MD;  Location: [place];  Service: ENT;  Laterality: Bilateral;   POLYPECTOMY  01/06/2022   Procedure: POLYPECTOMY INTESTINAL;  Surgeon: Harvel Quale, MD;  [facility];  Service: Gastroenterology;;   SVT ABLATION N/A 04/16/2021   Procedure: SVT ABLATION;  Surgeon: Evans Lance, MD;  Location: [facility];  Service: Cardiovascular;  Laterality: N/A;     Family History  Problem Relation Age of Onset   Hypertension Mother      Social History   Socioeconomic History   Marital status: Significant Other    Spouse name: Raymond Malone   Number of children: 7   Years of education: 14   Highest education level: Not on file  Occupational History   Occupation: factory work, sorting  Tobacco Use   Smoking status: Former    Packs/day: 1.00    Types: Cigarettes    Start date: 05/11/1994   Smokeless tobacco: Never  Vaping Use   Vaping Use: Every day  Substance and Sexual Activity   Alcohol use: Not Currently    Comment: Occasional   Drug use: Not Currently    Types: Marijuana    Comment: last=yesterday   Sexual activity: Yes    Birth control/protection: Condom  Other Topics Concern   Not on file  Social History Narrative   Lives with fiancee, mother, and 7 children, 3 are biological his, 2 are hers and 1 nephew   No pets in the home      Enjoys: spending time with the kids      Diet: Eats all food groups, does not have a true focus for diabetes   Caffeine: Drinks about 1 or 2 sodas a week overall has reduced his caffeine intake sometimes some tea   Water: Reports taking 6-8 times a day for more      Wears a seatbelt, does not use his phone or driving   Smoke detectors at home   Does not have fire extinguisher at this time   No weapons in the home   Social Determinants of Health   Financial Resource Strain: [place]   (02/09/2020)   Overall Financial Resource Strain (CARDIA)    Difficulty of Paying Living Expenses: Not hard at all  Food Insecurity: No Food Insecurity (02/09/2020)   Hunger Vital Sign    Worried About Running Out of Food in the Last Year: Never true    Ran Out of Food in the Last Year: Never true  Transportation Needs: No Transportation Needs (02/09/2020)   PRAPARE - Transportation    Lack  of Transportation (Medical): No    Lack of Transportation (Non-Medical): No  Physical Activity: Inactive (02/09/2020)   Exercise Vital Sign    Days of Exercise per Week: 0 days    Minutes of Exercise per Session: 0 min  Stress: Stress Concern Present (02/09/2020)   [place]    Feeling of Stress : To some extent  Social Connections: Moderately Isolated (02/09/2020)   Social Connection and Isolation Panel [NHANES]    Frequency of Communication with Friends and Family: More than three times a week    Frequency of Social Gatherings with Friends and Family: More than three times a week    Attends Religious Services: Never    Marine scientist or Organizations: No    Attends Archivist Meetings: Never    Marital Status: Living with partner  Intimate Partner Violence: Not At Risk (02/09/2020)   Humiliation, Afraid, Rape, and Kick questionnaire    Fear of Current or Ex-Partner: No    Emotionally Abused: No    Physically Abused: No    Sexually Abused: No     BP 116/76   Pulse 86   Ht $R'5\' 11"'UZ$  (1.803 m)   Wt 234 lb (106.1 kg)   SpO2 96%   BMI 32.64 kg/m   Physical Exam:  Well appearing NAD HEENT: Unremarkable Neck:  No JVD, no thyromegally Lymphatics:  No adenopathy Back:  No CVA tenderness Lungs:  Clear HEART:  Regular rate rhythm, no murmurs, no rubs, no clicks Abd:  soft, positive bowel sounds, no organomegally, no rebound, no guarding Ext:  2 plus pulses, no edema, no cyanosis, no clubbing Skin:  No rashes no  nodules Neuro:  CN II through XII intact, motor grossly intact  DEVICE  Normal device function.  See PaceArt for details.   Assess/Plan:   SVT - he is doing well s/p ablation and is asymptomatic. He will undergo watchful waiting. HTN - he is encouraged to lose weight and avoid salty foods. Exercise - I asked him to start slowly and first only walk or ride a stationary bike before increasing his activity.  Carleene Overlie Tarvis Blossom,MD

## 2022-02-24 NOTE — Progress Notes (Signed)
Virtual Visit via Telephone Note   This visit type was conducted via telephone. This format is felt to be most appropriate for this patient at this time.  The patient did not have access to video technology/had technical difficulties with video requiring transitioning to audio format only (telephone).  All issues noted in this document were discussed and addressed.  No physical exam could be performed with this format.  Evaluation Performed:  Follow-up visit  Date:  02/24/2022   ID:  Raymond Malone, DOB [birth year], MRN [id number]  Patient Location: Home Provider Location: Office/Clinic  Participants: Patient Location of Patient: Home Location of Provider: clinic Consent was obtain for visit to be over via telehealth. I verified that I am speaking with the correct person using two identifiers.  PCP:  Alvira Monday, FNP   Chief Complaint:  skin abscess  History of Present Illness:    Raymond Malone is a 46 y.o. male with complaints of skin abscess in his groin and his right axilla.  He complains of tenderness and swelling.  He denies fever and chills, with no drainage reported.  The patient does not have symptoms concerning for COVID-19 infection (fever, chills, cough, or new shortness of breath).   Past Medical, Surgical, Social History, Allergies, and Medications have been Reviewed.  Past Medical History:  Diagnosis Date   Abscess, axilla 10/06/2016   Alcohol dependence ([place]) 10/06/2016   Current smoker 10/06/2016   Dyshidrotic eczema 10/27/2016   GERD (gastroesophageal reflux disease)    Hyperlipidemia    Hyperosmolar non-ketotic state in patient with type 2 diabetes mellitus ([place]) 10/06/2016   Hypertension    MRSA (methicillin resistant Staphylococcus aureus)    Skin abscess    Type 2 diabetes mellitus ([place])    Past Surgical History:  Procedure Laterality Date   ANKLE SURGERY     BIOPSY  01/06/2022   Procedure: BIOPSY;  Surgeon: Harvel Quale, MD;   [facility];  Service: Gastroenterology;;   COLONOSCOPY WITH PROPOFOL N/A 01/06/2022   Procedure: COLONOSCOPY WITH PROPOFOL;  Surgeon: Harvel Quale, MD;  [facility];  Service: Gastroenterology;  Laterality: N/A;  730   HAND SURGERY     boxer's fracture L hand   INCISION AND DRAINAGE PERIRECTAL ABSCESS     IR IMAGING GUIDED PORT INSERTION  02/10/2022   MYRINGOTOMY WITH TUBE PLACEMENT Bilateral 07/22/2020   Procedure: BILATERAL MYRINGOTOMY WITH TUBE PLACEMENT;  Surgeon: Leta Baptist, MD;  Location: [place];  Service: ENT;  Laterality: Bilateral;   POLYPECTOMY  01/06/2022   Procedure: POLYPECTOMY INTESTINAL;  Surgeon: Harvel Quale, MD;  [facility];  Service: Gastroenterology;;   SVT ABLATION N/A 04/16/2021   Procedure: SVT ABLATION;  Surgeon: Evans Lance, MD;  Location: [facility];  Service: Cardiovascular;  Laterality: N/A;     Current Meds  Medication Sig   Accu-Chek Softclix Lancets lancets 1 each by Other route 2 (two) times daily. Use as instructed   acetaminophen (TYLENOL) 500 MG tablet Take 1 tablet (500 mg total) by mouth every 6 (six) hours as needed.   blood glucose meter kit and supplies Dispense based on patient and insurance preference. Use up to four times daily as directed. (FOR ICD-10 E10.9, E11.9).   Blood Glucose Monitoring Suppl (ACCU-CHEK GUIDE ME) w/Device KIT 1 Piece by Does not apply route as directed.   doxycycline (VIBRA-TABS) 100 MG tablet Take 1 tablet (100 mg total) by mouth  2 (two) times daily for 5 days.   fluorouracil CALGB 94801 2,400 mg/m2 in sodium chloride 0.9 % 150 mL Inject 2,400 mg/m2 into the vein over 48 hr.   FLUOROURACIL IV Inject into the vein every 14 (fourteen) days.   glipiZIDE (GLUCOTROL) 5 MG tablet Take 1 tablet (5 mg total) by mouth daily before breakfast.   glucose blood (ACCU-CHEK GUIDE) test strip Use to test glucose 2 times a day.   Insulin Pen Needle (PEN  NEEDLES) 31G X 5 MM MISC 1 each by Does not apply route in the morning, at noon, in the evening, and at bedtime.   LEUCOVORIN CALCIUM IV Inject into the vein every 14 (fourteen) days.   lidocaine-prilocaine (EMLA) cream Apply a small amount to port a cath site and cover with plastic wrap 1 hour prior to infusion appointments   lisinopril-hydrochlorothiazide (ZESTORETIC) 20-25 MG tablet Take 1 tablet by mouth once daily   lisinopril-hydrochlorothiazide (ZESTORETIC) 20-25 MG tablet Take 1 tablet by mouth daily.   meloxicam (MOBIC) 7.5 MG tablet Take 1 tablet (7.5 mg total) by mouth daily.   metFORMIN (GLUCOPHAGE) 1000 MG tablet Take 1 tablet (1,000 mg total) by mouth 2 (two) times daily with a meal.   OXALIPLATIN IV Inject into the vein every 14 (fourteen) days.   pantoprazole (PROTONIX) 40 MG tablet Take 1 tablet (40 mg total) by mouth daily.   prochlorperazine (COMPAZINE) 10 MG tablet Take 1 tablet (10 mg total) by mouth every 6 (six) hours as needed for nausea or vomiting.   SEMGLEE, YFGN, 100 UNIT/ML Pen Inject into the skin.   simvastatin (ZOCOR) 40 MG tablet Take 1 tablet by mouth in the evening     Allergies:   Patient has no known allergies.   ROS:   Please see the history of present illness.     All other systems reviewed and are negative.   Labs/Other Tests and Data Reviewed:    Recent Labs: 11/21/2021: TSH 1.130 02/16/2022: Magnesium 2.0 02/24/2022: ALT 27; BUN 11; Creatinine, Ser 0.86; Hemoglobin 14.6; Platelets 335; Potassium 3.7; Sodium 131   Recent Lipid Panel Lab Results  Component Value Date/Time   CHOL 126 11/21/2021 03:11 PM   TRIG 93 11/21/2021 03:11 PM   HDL 40 11/21/2021 03:11 PM   CHOLHDL 3.2 11/21/2021 03:11 PM   CHOLHDL 4.3 02/08/2017 08:52 AM   LDLCALC 68 11/21/2021 03:11 PM   LDLCALC 108 (H) 02/08/2017 08:52 AM    Wt Readings from Last 3 Encounters:  02/24/22 234 lb (106.1 kg)  02/16/22 239 lb 9.6 oz (108.7 kg)  02/12/22 236 lb (107 kg)      Objective:    Vital Signs:  There were no vitals taken for this visit.     ASSESSMENT & PLAN:   Skin abscess Bactrim interacts with leucovorin calcium Will treat with  oral doxycycline 100 mg for 5 days Encouraged patient to follow-up if symptoms worsen or no relief of symptoms after antibiotic treatment  Time:   Today, I have spent 11 minutes reviewing the chart, including problem list, medications, and with the patient with telehealth technology discussing the above problems.   Medication Adjustments/Labs and Tests Ordered: Current medicines are reviewed at length with the patient today.  Concerns regarding medicines are outlined above.   Tests Ordered: No orders of the defined types were placed in this encounter.   Medication Changes: Meds ordered this encounter  Medications   doxycycline (VIBRA-TABS) 100 MG tablet    Sig: Take 1  tablet (100 mg total) by mouth 2 (two) times daily for 5 days.    Dispense:  10 tablet    Refill:  0     Note: This dictation was prepared with Dragon dictation along with smaller phrase technology. Similar sounding words can be transcribed inadequately or may not be corrected upon review. Any transcriptional errors that result from this process are unintentional.      Disposition:  Follow up  Signed, Alvira Monday, FNP  02/24/2022 4:36 PM     [facility]

## 2022-02-24 NOTE — Patient Instructions (Signed)
Medication Instructions:  Your physician recommends that you continue on your current medications as directed. Please refer to the Current Medication list given to you today.  *If you need a refill on your cardiac medications before your next appointment, please call your pharmacy*   Lab Work: NONE   If you have labs (blood work) drawn today and your tests are completely normal, you will receive your results only by: [place] (if you have MyChart) OR A paper copy in the mail If you have any lab test that is abnormal or we need to change your treatment, we will call you to review the results.   Testing/Procedures: NONE    Follow-Up: At [facility], you and your health needs are our priority.  As part of our continuing mission to provide you with exceptional heart care, we have created designated Provider Care Teams.  These Care Teams include your primary Cardiologist (physician) and Advanced Practice Providers (APPs -  Physician Assistants and Nurse Practitioners) who all work together to provide you with the care you need, when you need it.  We recommend signing up for the patient portal called "MyChart".  Sign up information is provided on this After Visit Summary.  MyChart is used to connect with patients for Virtual Visits (Telemedicine).  Patients are able to view lab/test results, encounter notes, upcoming appointments, etc.  Non-urgent messages can be sent to your provider as well.   To learn more about what you can do with MyChart, go to [email or web address].    Your next appointment:    As Needed   The format for your next appointment:   In Person  Provider:   Cristopher Peru, MD    Other Instructions Thank you for choosing [facility]!    Important Information About [place]

## 2022-02-27 LAB — URINE CULTURE: Culture: 10000 — AB

## 2022-03-04 ENCOUNTER — Inpatient Hospital Stay: Payer: Medicaid Other

## 2022-03-04 ENCOUNTER — Encounter: Payer: Self-pay | Admitting: Hematology

## 2022-03-04 ENCOUNTER — Inpatient Hospital Stay (HOSPITAL_BASED_OUTPATIENT_CLINIC_OR_DEPARTMENT_OTHER): Payer: Medicaid Other | Admitting: Hematology

## 2022-03-04 VITALS — BP 125/63 | HR 87 | Temp 98.4°F | Resp 18 | Wt 234.0 lb

## 2022-03-04 VITALS — BP 127/72 | HR 72 | Temp 98.0°F | Resp 18

## 2022-03-04 DIAGNOSIS — Z5111 Encounter for antineoplastic chemotherapy: Secondary | ICD-10-CM | POA: Diagnosis not present

## 2022-03-04 DIAGNOSIS — C20 Malignant neoplasm of rectum: Secondary | ICD-10-CM

## 2022-03-04 LAB — CBC WITH DIFFERENTIAL/PLATELET
Abs Immature Granulocytes: 0.01 10*3/uL (ref 0.00–0.07)
Basophils Absolute: 0.1 10*3/uL (ref 0.0–0.1)
Basophils Relative: 2 %
Eosinophils Absolute: 0.3 10*3/uL (ref 0.0–0.5)
Eosinophils Relative: 7 %
HCT: 40.5 % (ref 39.0–52.0)
Hemoglobin: 14.3 g/dL (ref 13.0–17.0)
Immature Granulocytes: 0 %
Lymphocytes Relative: 34 %
Lymphs Abs: 1.5 10*3/uL (ref 0.7–4.0)
MCH: 29.5 pg (ref 26.0–34.0)
MCHC: 35.3 g/dL (ref 30.0–36.0)
MCV: 83.5 fL (ref 80.0–100.0)
Monocytes Absolute: 0.7 10*3/uL (ref 0.1–1.0)
Monocytes Relative: 15 %
Neutro Abs: 1.8 10*3/uL (ref 1.7–7.7)
Neutrophils Relative %: 42 %
Platelets: 327 10*3/uL (ref 150–400)
RBC: 4.85 MIL/uL (ref 4.22–5.81)
RDW: 12.2 % (ref 11.5–15.5)
WBC: 4.3 10*3/uL (ref 4.0–10.5)
nRBC: 0 % (ref 0.0–0.2)

## 2022-03-04 LAB — COMPREHENSIVE METABOLIC PANEL
ALT: 23 U/L (ref 0–44)
AST: 19 U/L (ref 15–41)
Albumin: 4.2 g/dL (ref 3.5–5.0)
Alkaline Phosphatase: 76 U/L (ref 38–126)
Anion gap: 9 (ref 5–15)
BUN: 13 mg/dL (ref 6–20)
CO2: 27 mmol/L (ref 22–32)
Calcium: 9.6 mg/dL (ref 8.9–10.3)
Chloride: 96 mmol/L — ABNORMAL LOW (ref 98–111)
Creatinine, Ser: 0.84 mg/dL (ref 0.61–1.24)
GFR, Estimated: >60 mL/min (ref >60)
Glucose, Bld: 196 mg/dL — ABNORMAL HIGH (ref 70–99)
Potassium: 4.1 mmol/L (ref 3.5–5.1)
Sodium: 132 mmol/L — ABNORMAL LOW (ref 135–145)
Total Bilirubin: 0.9 mg/dL (ref 0.3–1.2)
Total Protein: 7.1 g/dL (ref 6.5–8.1)

## 2022-03-04 LAB — MAGNESIUM: Magnesium: 2.3 mg/dL (ref 1.7–2.4)

## 2022-03-04 MED ORDER — FLUOROURACIL CHEMO INJECTION 2.5 GM/50ML
400.0000 mg/m2 | Freq: Once | INTRAVENOUS | Status: AC
  Administered 2022-03-04: 900 mg via INTRAVENOUS
  Filled 2022-03-04: qty 18

## 2022-03-04 MED ORDER — DEXTROSE 5 % IV SOLN: Freq: Once | INTRAVENOUS | Status: AC

## 2022-03-04 MED ORDER — SODIUM CHLORIDE 0.9 % IV SOLN
10.0000 mg | Freq: Once | INTRAVENOUS | Status: AC
  Administered 2022-03-04: 10 mg via INTRAVENOUS
  Filled 2022-03-04: qty 10

## 2022-03-04 MED ORDER — SODIUM CHLORIDE 0.9 % IV SOLN
5000.0000 mg | INTRAVENOUS | Status: DC
  Administered 2022-03-04: 5000 mg via INTRAVENOUS
  Filled 2022-03-04: qty 100

## 2022-03-04 MED ORDER — PALONOSETRON HCL INJECTION 0.25 MG/5ML
0.2500 mg | Freq: Once | INTRAVENOUS | Status: AC
  Administered 2022-03-04: 0.25 mg via INTRAVENOUS
  Filled 2022-03-04: qty 5

## 2022-03-04 MED ORDER — OXALIPLATIN CHEMO INJECTION 100 MG/20ML
85.0000 mg/m2 | Freq: Once | INTRAVENOUS | Status: AC
  Administered 2022-03-04: 195 mg via INTRAVENOUS
  Filled 2022-03-04: qty 39

## 2022-03-04 MED ORDER — LEUCOVORIN CALCIUM INJECTION 350 MG
400.0000 mg/m2 | Freq: Once | INTRAVENOUS | Status: AC
  Administered 2022-03-04: 924 mg via INTRAVENOUS
  Filled 2022-03-04: qty 46.2

## 2022-03-04 NOTE — Progress Notes (Signed)
Patient presents today for treatment and follow up visit with Dr. Delton Coombes. Labs within parameters for treatment. Vital signs within parameters for treatment .   Message received to proceed with treatment by secure chat by A. Ouida Sills RN / Dr. Delton Coombes.   Treatment given today per MD orders. Tolerated infusion without adverse affects. Vital signs stable. No complaints at this time. Discharged from clinic ambulatory in stable condition. Alert and oriented x 3. 5FU pump infusing and RUN noted on screen. F/U with [facility] as scheduled.

## 2022-03-04 NOTE — Patient Instructions (Addendum)
[facility] at [facility] Discharge Instructions   You were seen and examined today by Dr. Katragadda.  He reviewed the results of your lab work which are normal/stable.   We will proceed with your treatment today.  Return as scheduled.    Thank you for choosing [facility] at [facility] to provide your oncology and hematology care.  To afford each patient quality time with our provider, please arrive at least 15 minutes before your scheduled appointment time.   If you have a lab appointment with the Cancer Center please come in thru the Main Entrance and check in at the main information desk.  You need to re-schedule your appointment should you arrive 10 or more minutes late.  We strive to give you quality time with our providers, and arriving late affects you and other patients whose appointments are after yours.  Also, if you no show three or more times for appointments you may be dismissed from the clinic at the providers discretion.     Again, thank you for choosing [facility].  Our hope is that these requests will decrease the amount of time that you wait before being seen by our physicians.       _____________________________________________________________  Should you have questions after your visit to [facility], please contact our office at [phone] and follow the prompts.  Our office hours are 8:00 a.m. and 4:30 p.m. Monday - Friday.  Please note that voicemails left after 4:00 p.m. may not be returned until the following business day.  We are closed weekends and major holidays.  You do have access to a nurse 24-7, just call the main number to the clinic [phone] and do not press any options, hold on the line and a nurse will answer the phone.    For prescription refill requests, have your pharmacy contact our office and allow 72 hours.    Due to Covid, you will need to wear a mask upon entering  the hospital. If you do not have a mask, a mask will be given to you at the Main Entrance upon arrival. For doctor visits, patients may have 1 support person age 18 or older with them. For treatment visits, patients can not have anyone with them due to social distancing guidelines and our immunocompromised population.      

## 2022-03-04 NOTE — Progress Notes (Signed)
Patient has been examined by Dr. Katragadda, and vital signs and labs have been reviewed. ANC, Creatinine, LFTs, hemoglobin, and platelets are within treatment parameters per M.D. - pt may proceed with treatment.  Primary RN and pharmacy notified.  
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
## 2022-03-04 NOTE — Patient Instructions (Signed)
[facility]  Discharge Instructions: Thank you for choosing [facility] to provide your oncology and hematology care.  If you have a lab appointment with the [facility], please come in thru the Main Entrance and check in at the main information desk.  Wear comfortable clothing and clothing appropriate for easy access to any Portacath or PICC line.   We strive to give you quality time with your provider. You may need to reschedule your appointment if you arrive late (15 or more minutes).  Arriving late affects you and other patients whose appointments are after yours.  Also, if you miss three or more appointments without notifying the office, you may be dismissed from the clinic at the provider's discretion.      For prescription refill requests, have your pharmacy contact our office and allow 72 hours for refills to be completed.    Today you received the following chemotherapy and/or immunotherapy agents Adrucil pump infusion. 5FU.  The chemotherapy medication bag should finish at 45 hours, 96 hours, or 7 days. For example, if your pump is scheduled for 46 hours and it was put on at 4:00 p.m., it should finish at 2:00 p.m. the day it is scheduled to come off regardless of your appointment time.     Estimated time to finish at 11:30 am.   If the display on your pump reads "Low Volume" and it is beeping, take the batteries out of the pump and come to the cancer center for it to be taken off.   If the pump alarms go off prior to the pump reading "Low Volume" then call [phone] and someone can assist you.  If the plunger comes out and the chemotherapy medication is leaking out, please use your home chemo spill kit to clean up the spill. Do NOT use paper towels or other household products.  If you have problems or questions regarding your pump, please call either [phone] (24 hours a day) or the cancer center Monday-Friday 8:00 a.m.- 4:30 p.m. at  the clinic number and we will assist you. If you are unable to get assistance, then go to the nearest Emergency Department and ask the staff to contact the IV team for assistance.        To help prevent nausea and vomiting after your treatment, we encourage you to take your nausea medication as directed.  BELOW ARE SYMPTOMS THAT SHOULD BE REPORTED IMMEDIATELY: *FEVER GREATER THAN 100.4 F (38 C) OR HIGHER *CHILLS OR SWEATING *NAUSEA AND VOMITING THAT IS NOT CONTROLLED WITH YOUR NAUSEA MEDICATION *UNUSUAL SHORTNESS OF BREATH *UNUSUAL BRUISING OR BLEEDING *URINARY PROBLEMS (pain or burning when urinating, or frequent urination) *BOWEL PROBLEMS (unusual diarrhea, constipation, pain near the anus) TENDERNESS IN MOUTH AND THROAT WITH OR WITHOUT PRESENCE OF ULCERS (sore throat, sores in mouth, or a toothache) UNUSUAL RASH, SWELLING OR PAIN  UNUSUAL VAGINAL DISCHARGE OR ITCHING   Items with * indicate a potential emergency and should be followed up as soon as possible or go to the Emergency Department if any problems should occur.  Please show the CHEMOTHERAPY ALERT CARD or IMMUNOTHERAPY ALERT CARD at check-in to the Emergency Department and triage nurse.  Should you have questions after your visit or need to cancel or reschedule your appointment, please contact [facility] [phone]  and follow the prompts.  Office hours are 8:00 a.m. to 4:30 p.m. Monday - Friday. Please note that voicemails left after 4:00 p.m. may not  be returned until the following business day.  We are closed weekends and major holidays. You have access to a nurse at all times for urgent questions. Please call the main number to the clinic [phone] and follow the prompts.  For any non-urgent questions, you may also contact your provider using MyChart. We now offer e-Visits for anyone 45 and older to request care online for non-urgent symptoms. For details visit [email or web address].   Also  download the MyChart app! Go to the app store, search "MyChart", open the app, select [facility], and log in with your MyChart username and password.  Masks are optional in the cancer centers. If you would like for your care team to wear a mask while they are taking care of you, please let them know. You may have one support person who is at least 45 years old accompany you for your appointments.

## 2022-03-04 NOTE — Progress Notes (Signed)
Patients port flushed without difficulty.  Good blood return noted with no bruising or swelling noted at site.  Stable during access and blood draw.  Patient to remain accessed for treatment. 

## 2022-03-05 ENCOUNTER — Other Ambulatory Visit: Payer: Self-pay

## 2022-03-06 ENCOUNTER — Inpatient Hospital Stay: Payer: Medicaid Other

## 2022-03-06 VITALS — BP 136/79 | HR 87 | Temp 98.7°F | Resp 18

## 2022-03-06 DIAGNOSIS — Z5111 Encounter for antineoplastic chemotherapy: Secondary | ICD-10-CM | POA: Diagnosis not present

## 2022-03-06 DIAGNOSIS — C20 Malignant neoplasm of rectum: Secondary | ICD-10-CM

## 2022-03-06 MED ORDER — SODIUM CHLORIDE 0.9% FLUSH
10.0000 mL | INTRAVENOUS | Status: DC | PRN
  Administered 2022-03-06: 10 mL

## 2022-03-06 MED ORDER — HEPARIN SOD (PORK) LOCK FLUSH 100 UNIT/ML IV SOLN
500.0000 [IU] | Freq: Once | INTRAVENOUS | Status: AC | PRN
  Administered 2022-03-06: 500 [IU]

## 2022-03-06 NOTE — Patient Instructions (Signed)
[facility]  Discharge Instructions: Thank you for choosing [facility] to provide your oncology and hematology care.  If you have a lab appointment with the [facility], please come in thru the Main Entrance and check in at the main information desk.  Wear comfortable clothing and clothing appropriate for easy access to any Portacath or PICC line.   We strive to give you quality time with your provider. You may need to reschedule your appointment if you arrive late (15 or more minutes).  Arriving late affects you and other patients whose appointments are after yours.  Also, if you miss three or more appointments without notifying the office, you may be dismissed from the clinic at the provider's discretion.      For prescription refill requests, have your pharmacy contact our office and allow 72 hours for refills to be completed.    Today you received disconnection d/c  BELOW ARE SYMPTOMS THAT SHOULD BE REPORTED IMMEDIATELY: *FEVER GREATER THAN 100.4 F (38 C) OR HIGHER *CHILLS OR SWEATING *NAUSEA AND VOMITING THAT IS NOT CONTROLLED WITH YOUR NAUSEA MEDICATION *UNUSUAL SHORTNESS OF BREATH *UNUSUAL BRUISING OR BLEEDING *URINARY PROBLEMS (pain or burning when urinating, or frequent urination) *BOWEL PROBLEMS (unusual diarrhea, constipation, pain near the anus) TENDERNESS IN MOUTH AND THROAT WITH OR WITHOUT PRESENCE OF ULCERS (sore throat, sores in mouth, or a toothache) UNUSUAL RASH, SWELLING OR PAIN  UNUSUAL VAGINAL DISCHARGE OR ITCHING   Items with * indicate a potential emergency and should be followed up as soon as possible or go to the Emergency Department if any problems should occur.  Please show the CHEMOTHERAPY ALERT CARD or IMMUNOTHERAPY ALERT CARD at check-in to the Emergency Department and triage nurse.  Should you have questions after your visit or need to cancel or reschedule your appointment, please contact [facility] [phone]  and follow the prompts.  Office hours are 8:00 a.m. to 4:30 p.m. Monday - Friday. Please note that voicemails left after 4:00 p.m. may not be returned until the following business day.  We are closed weekends and major holidays. You have access to a nurse at all times for urgent questions. Please call the main number to the clinic [phone] and follow the prompts.  For any non-urgent questions, you may also contact your provider using MyChart. We now offer e-Visits for anyone 24 and older to request care online for non-urgent symptoms. For details visit [email or web address].   Also download the MyChart app! Go to the app store, search "MyChart", open the app, select [facility], and log in with your MyChart username and password.  Masks are optional in the cancer centers. If you would like for your care team to wear a mask while they are taking care of you, please let them know. You may have one support person who is at least 45 years old accompany you for your appointments.

## 2022-03-06 NOTE — Progress Notes (Signed)
Pt presents today for 5FU chemotherapy pump disconnection per provider's order. Vital signs stable and port flushed easily without difficulty with 77m of normal saline and 533mof heparin. Good blood return noted and needle removed intact. No swelling or bruising or swelling noted at the site.   Discharged from clinic ambulatory in stable condition. Alert and oriented x 3. [facility]

## 2022-03-16 ENCOUNTER — Ambulatory Visit: Payer: Medicaid Other | Admitting: Hematology

## 2022-03-16 ENCOUNTER — Other Ambulatory Visit: Payer: Medicaid Other

## 2022-03-16 ENCOUNTER — Ambulatory Visit: Payer: Medicaid Other

## 2022-03-17 ENCOUNTER — Other Ambulatory Visit: Payer: Self-pay

## 2022-03-17 DIAGNOSIS — C20 Malignant neoplasm of rectum: Secondary | ICD-10-CM

## 2022-03-18 ENCOUNTER — Inpatient Hospital Stay: Payer: Medicaid Other | Admitting: Hematology

## 2022-03-18 ENCOUNTER — Inpatient Hospital Stay: Payer: Medicaid Other

## 2022-03-18 ENCOUNTER — Inpatient Hospital Stay: Payer: Medicaid Other | Attending: Hematology

## 2022-03-18 ENCOUNTER — Encounter: Payer: Self-pay | Admitting: Hematology

## 2022-03-18 DIAGNOSIS — C20 Malignant neoplasm of rectum: Secondary | ICD-10-CM

## 2022-03-18 DIAGNOSIS — I10 Essential (primary) hypertension: Secondary | ICD-10-CM | POA: Insufficient documentation

## 2022-03-18 DIAGNOSIS — Z5111 Encounter for antineoplastic chemotherapy: Secondary | ICD-10-CM | POA: Diagnosis not present

## 2022-03-18 DIAGNOSIS — E1142 Type 2 diabetes mellitus with diabetic polyneuropathy: Secondary | ICD-10-CM | POA: Insufficient documentation

## 2022-03-18 DIAGNOSIS — F17290 Nicotine dependence, other tobacco product, uncomplicated: Secondary | ICD-10-CM | POA: Insufficient documentation

## 2022-03-18 LAB — CBC WITH DIFFERENTIAL/PLATELET
Abs Immature Granulocytes: 0.01 10*3/uL (ref 0.00–0.07)
Basophils Absolute: 0.1 10*3/uL (ref 0.0–0.1)
Basophils Relative: 2 %
Eosinophils Absolute: 0.3 10*3/uL (ref 0.0–0.5)
Eosinophils Relative: 8 %
HCT: 38.2 % — ABNORMAL LOW (ref 39.0–52.0)
Hemoglobin: 13.4 g/dL (ref 13.0–17.0)
Immature Granulocytes: 0 %
Lymphocytes Relative: 34 %
Lymphs Abs: 1.4 10*3/uL (ref 0.7–4.0)
MCH: 29.6 pg (ref 26.0–34.0)
MCHC: 35.1 g/dL (ref 30.0–36.0)
MCV: 84.5 fL (ref 80.0–100.0)
Monocytes Absolute: 0.6 10*3/uL (ref 0.1–1.0)
Monocytes Relative: 14 %
Neutro Abs: 1.8 10*3/uL (ref 1.7–7.7)
Neutrophils Relative %: 42 %
Platelets: 276 10*3/uL (ref 150–400)
RBC: 4.52 MIL/uL (ref 4.22–5.81)
RDW: 12.9 % (ref 11.5–15.5)
WBC: 4.2 10*3/uL (ref 4.0–10.5)
nRBC: 0 % (ref 0.0–0.2)

## 2022-03-18 LAB — COMPREHENSIVE METABOLIC PANEL
ALT: 31 U/L (ref 0–44)
AST: 25 U/L (ref 15–41)
Albumin: 3.9 g/dL (ref 3.5–5.0)
Alkaline Phosphatase: 65 U/L (ref 38–126)
Anion gap: 7 (ref 5–15)
BUN: 14 mg/dL (ref 6–20)
CO2: 27 mmol/L (ref 22–32)
Calcium: 9.2 mg/dL (ref 8.9–10.3)
Chloride: 102 mmol/L (ref 98–111)
Creatinine, Ser: 0.8 mg/dL (ref 0.61–1.24)
GFR, Estimated: >60 mL/min (ref >60)
Glucose, Bld: 184 mg/dL — ABNORMAL HIGH (ref 70–99)
Potassium: 3.6 mmol/L (ref 3.5–5.1)
Sodium: 136 mmol/L (ref 135–145)
Total Bilirubin: 0.6 mg/dL (ref 0.3–1.2)
Total Protein: 6.6 g/dL (ref 6.5–8.1)

## 2022-03-18 LAB — MAGNESIUM: Magnesium: 2.1 mg/dL (ref 1.7–2.4)

## 2022-03-18 MED ORDER — SODIUM CHLORIDE 0.9 % IV SOLN
10.0000 mg | Freq: Once | INTRAVENOUS | Status: AC
  Administered 2022-03-18: 10 mg via INTRAVENOUS
  Filled 2022-03-18: qty 10

## 2022-03-18 MED ORDER — HEPARIN SOD (PORK) LOCK FLUSH 100 UNIT/ML IV SOLN: 500.0000 [IU] | Freq: Once | INTRAVENOUS | Status: DC | PRN

## 2022-03-18 MED ORDER — DEXTROSE 5 % IV SOLN: Freq: Once | INTRAVENOUS | Status: AC

## 2022-03-18 MED ORDER — SODIUM CHLORIDE 0.9% FLUSH: 10.0000 mL | INTRAVENOUS | Status: DC | PRN

## 2022-03-18 MED ORDER — SODIUM CHLORIDE 0.9 % IV SOLN
5000.0000 mg | INTRAVENOUS | Status: DC
  Administered 2022-03-18: 5000 mg via INTRAVENOUS
  Filled 2022-03-18: qty 100

## 2022-03-18 MED ORDER — FLUOROURACIL CHEMO INJECTION 2.5 GM/50ML
400.0000 mg/m2 | Freq: Once | INTRAVENOUS | Status: AC
  Administered 2022-03-18: 900 mg via INTRAVENOUS
  Filled 2022-03-18: qty 18

## 2022-03-18 MED ORDER — PALONOSETRON HCL INJECTION 0.25 MG/5ML
0.2500 mg | Freq: Once | INTRAVENOUS | Status: AC
  Administered 2022-03-18: 0.25 mg via INTRAVENOUS
  Filled 2022-03-18: qty 5

## 2022-03-18 MED ORDER — SODIUM CHLORIDE 0.9 % IV SOLN: Freq: Once | INTRAVENOUS | Status: AC

## 2022-03-18 MED ORDER — OXALIPLATIN CHEMO INJECTION 100 MG/20ML
85.0000 mg/m2 | Freq: Once | INTRAVENOUS | Status: AC
  Administered 2022-03-18: 195 mg via INTRAVENOUS
  Filled 2022-03-18: qty 39

## 2022-03-18 MED ORDER — LEUCOVORIN CALCIUM INJECTION 350 MG
400.0000 mg/m2 | Freq: Once | INTRAVENOUS | Status: AC
  Administered 2022-03-18: 924 mg via INTRAVENOUS
  Filled 2022-03-18: qty 46.2

## 2022-03-18 NOTE — Patient Instructions (Addendum)
[facility] [place] at [facility] Discharge Instructions   You were seen and examined today by Dr. Delton Coombes.  He reviewed the results of your lab work which are normal with exception of your blood sugar which is 184.   We will proceed with your treatment today.   Return as scheduled for next treatment. We will see you at that time.    Thank you for choosing [facility] at [facility] to provide your oncology and hematology care.  To afford each patient quality time with our provider, please arrive at least 15 minutes before your scheduled appointment time.   If you have a lab appointment with the [facility] please come in thru the Main Entrance and check in at the main information desk.  You need to re-schedule your appointment should you arrive 10 or more minutes late.  We strive to give you quality time with our providers, and arriving late affects you and other patients whose appointments are after yours.  Also, if you no show three or more times for appointments you may be dismissed from the clinic at the providers discretion.     Again, thank you for choosing [facility].  Our hope is that these requests will decrease the amount of time that you wait before being seen by our physicians.       _____________________________________________________________  Should you have questions after your visit to [facility], please contact our office at [phone] and follow the prompts.  Our office hours are 8:00 a.m. and 4:30 p.m. Monday - Friday.  Please note that voicemails left after 4:00 p.m. may not be returned until the following business day.  We are closed weekends and major holidays.  You do have access to a nurse 24-7, just call the main number to the clinic [phone] and do not press any options, hold on the line and a nurse will answer the phone.    For prescription refill requests, have your pharmacy contact our  office and allow 72 hours.    Due to Covid, you will need to wear a mask upon entering the hospital. If you do not have a mask, a mask will be given to you at the Main Entrance upon arrival. For doctor visits, patients may have 1 support person age 80 or older with them. For treatment visits, patients can not have anyone with them due to social distancing guidelines and our immunocompromised population.

## 2022-03-18 NOTE — Progress Notes (Signed)
Patients port flushed without difficulty.  Good blood return noted with no bruising or swelling noted at site.  Stable during access and blood draw.  Patient to remain accessed for treatment. 

## 2022-03-18 NOTE — Progress Notes (Unsigned)
Patient has been examined by Dr. Katragadda, and vital signs and labs have been reviewed. ANC, Creatinine, LFTs, hemoglobin, and platelets are within treatment parameters per M.D. - pt may proceed with treatment.  Primary RN and pharmacy notified.  

## 2022-03-18 NOTE — Progress Notes (Signed)
Patient presents today for chemotherapy infusion.  Patient is in satisfactory condition with no new complaints voiced. Vital signs are stable.  Labs reviewed by Dr. Delton Coombes during his office visit.  All labs are within treatment parameters. We will proceed with treatment per MD orders.   Patient tolerated treatment well with no complaints voiced.  Home infusion 5FU pump connected.  Patient left ambulatory in stable condition.  Vital signs stable at discharge.  Follow up as scheduled.

## 2022-03-18 NOTE — Patient Instructions (Addendum)
[facility]  Discharge Instructions: Thank you for choosing [facility] to provide your oncology and hematology care.  If you have a lab appointment with the [facility], please come in thru the Main Entrance and check in at the main information desk.  Wear comfortable clothing and clothing appropriate for easy access to any Portacath or PICC line.   We strive to give you quality time with your provider. You may need to reschedule your appointment if you arrive late (15 or more minutes).  Arriving late affects you and other patients whose appointments are after yours.  Also, if you miss three or more appointments without notifying the office, you may be dismissed from the clinic at the provider's discretion.      For prescription refill requests, have your pharmacy contact our office and allow 72 hours for refills to be completed.    Today you received the following chemotherapy and/or immunotherapy agents Leucovorin/Oxaliplatin/5FU.   Fluorouracil Injection What is this medication? FLUOROURACIL (flure oh YOOR a sil) treats some types of cancer. It works by slowing down the growth of cancer cells. This medicine may be used for other purposes; ask your health care provider or pharmacist if you have questions. COMMON BRAND NAME(S): Adrucil What should I tell my care team before I take this medication? They need to know if you have any of these conditions: Blood disorders Dihydropyrimidine dehydrogenase (DPD) deficiency Infection, such as chickenpox, cold sores, herpes Kidney disease Liver disease Poor nutrition Recent or ongoing radiation therapy An unusual or allergic reaction to fluorouracil, other medications, foods, dyes, or preservatives If you or your partner are pregnant or trying to get pregnant Breast-feeding How should I use this medication? This medication is injected into a vein. It is administered by your care team in a hospital or  clinic setting. Talk to your care team about the use of this medication in children. Special care may be needed. Overdosage: If you think you have taken too much of this medicine contact a poison control center or emergency room at once. NOTE: This medicine is only for you. Do not share this medicine with others. What if I miss a dose? Keep appointments for follow-up doses. It is important not to miss your dose. Call your care team if you are unable to keep an appointment. What may interact with this medication? Do not take this medication with any of the following: Live virus vaccines This medication may also interact with the following: Medications that treat or prevent blood clots, such as warfarin, enoxaparin, dalteparin This list may not describe all possible interactions. Give your health care provider a list of all the medicines, herbs, non-prescription drugs, or dietary supplements you use. Also tell them if you smoke, drink alcohol, or use illegal drugs. Some items may interact with your medicine. What should I watch for while using this medication? Your condition will be monitored carefully while you are receiving this medication. This medication may make you feel generally unwell. This is not uncommon as chemotherapy can affect healthy cells as well as cancer cells. Report any side effects. Continue your course of treatment even though you feel ill unless your care team tells you to stop. In some cases, you may be given additional medications to help with side effects. Follow all directions for their use. This medication may increase your risk of getting an infection. Call your care team for advice if you get a fever, chills, sore throat, or other  symptoms of a cold or flu. Do not treat yourself. Try to avoid being around people who are sick. This medication may increase your risk to bruise or bleed. Call your care team if you notice any unusual bleeding. Be careful brushing or flossing  your teeth or using a toothpick because you may get an infection or bleed more easily. If you have any dental work done, tell your dentist you are receiving this medication. Avoid taking medications that contain aspirin, acetaminophen, ibuprofen, naproxen, or ketoprofen unless instructed by your care team. These medications may hide a fever. Do not treat diarrhea with over the counter products. Contact your care team if you have diarrhea that lasts more than 2 days or if it is severe and watery. This medication can make you more sensitive to the sun. Keep out of the sun. If you cannot avoid being in the sun, wear protective clothing and sunscreen. Do not use sun lamps, tanning beds, or tanning booths. Talk to your care team if you or your partner wish to become pregnant or think you might be pregnant. This medication can cause serious birth defects if taken during pregnancy and for 3 months after the last dose. A reliable form of contraception is recommended while taking this medication and for 3 months after the last dose. Talk to your care team about effective forms of contraception. Do not father a child while taking this medication and for 3 months after the last dose. Use a condom while having sex during this time period. Do not breastfeed while taking this medication. This medication may cause infertility. Talk to your care team if you are concerned about your fertility. What side effects may I notice from receiving this medication? Side effects that you should report to your care team as soon as possible: Allergic reactions--skin rash, itching, hives, swelling of the face, lips, tongue, or throat Heart attack--pain or tightness in the chest, shoulders, arms, or jaw, nausea, shortness of breath, cold or clammy skin, feeling faint or lightheaded Heart failure--shortness of breath, swelling of the ankles, feet, or hands, sudden weight gain, unusual weakness or fatigue Heart rhythm changes--fast or  irregular heartbeat, dizziness, feeling faint or lightheaded, chest pain, trouble breathing High ammonia level--unusual weakness or fatigue, confusion, loss of appetite, nausea, vomiting, seizures Infection--fever, chills, cough, sore throat, wounds that don't heal, pain or trouble when passing urine, general feeling of discomfort or being unwell Low red blood cell level--unusual weakness or fatigue, dizziness, headache, trouble breathing Pain, tingling, or numbness in the hands or feet, muscle weakness, change in vision, confusion or trouble speaking, loss of balance or coordination, trouble walking, seizures Redness, swelling, and blistering of the skin over hands and feet Severe or prolonged diarrhea Unusual bruising or bleeding Side effects that usually do not require medical attention (report to your care team if they continue or are bothersome): Dry skin Headache Increased tears Nausea Pain, redness, or swelling with sores inside the mouth or throat Sensitivity to light Vomiting This list may not describe all possible side effects. Call your doctor for medical advice about side effects. You may report side effects to FDA at 1-800-FDA-1088. Where should I keep my medication? This medication is given in a hospital or clinic. It will not be stored at home. NOTE: This sheet is a summary. It may not cover all possible information. If you have questions about this medicine, talk to your doctor, pharmacist, or health care provider.  2023 Elsevier/Gold Standard (2021-08-26 00:00:00)   Oxaliplatin  Injection What is this medication? OXALIPLATIN (ox AL i PLA tin) treats some types of cancer. It works by slowing down the growth of cancer cells. This medicine may be used for other purposes; ask your health care provider or pharmacist if you have questions. COMMON BRAND NAME(S): Eloxatin What should I tell my care team before I take this medication? They need to know if you have any of these  conditions: Heart disease History of irregular heartbeat or rhythm Liver disease Low blood cell levels (white cells, red cells, and platelets) Lung or breathing disease, such as asthma Take medications that treat or prevent blood clots Tingling of the fingers, toes, or other nerve disorder An unusual or allergic reaction to oxaliplatin, other medications, foods, dyes, or preservatives If you or your partner are pregnant or trying to get pregnant Breast-feeding How should I use this medication? This medication is injected into a vein. It is given by your care team in a hospital or clinic setting. Talk to your care team about the use of this medication in children. Special care may be needed. Overdosage: If you think you have taken too much of this medicine contact a poison control center or emergency room at once. NOTE: This medicine is only for you. Do not share this medicine with others. What if I miss a dose? Keep appointments for follow-up doses. It is important not to miss a dose. Call your care team if you are unable to keep an appointment. What may interact with this medication? Do not take this medication with any of the following: Cisapride Dronedarone Pimozide Thioridazine This medication may also interact with the following: Aspirin and aspirin-like medications Certain medications that treat or prevent blood clots, such as warfarin, apixaban, dabigatran, and rivaroxaban Cisplatin Cyclosporine Diuretics Medications for infection, such as acyclovir, adefovir, amphotericin B, bacitracin, cidofovir, foscarnet, ganciclovir, gentamicin, pentamidine, vancomycin NSAIDs, medications for pain and inflammation, such as ibuprofen or naproxen Other medications that cause heart rhythm changes Pamidronate Zoledronic acid This list may not describe all possible interactions. Give your health care provider a list of all the medicines, herbs, non-prescription drugs, or dietary supplements  you use. Also tell them if you smoke, drink alcohol, or use illegal drugs. Some items may interact with your medicine. What should I watch for while using this medication? Your condition will be monitored carefully while you are receiving this medication. You may need blood work while taking this medication. This medication may make you feel generally unwell. This is not uncommon as chemotherapy can affect healthy cells as well as cancer cells. Report any side effects. Continue your course of treatment even though you feel ill unless your care team tells you to stop. This medication may increase your risk of getting an infection. Call your care team for advice if you get a fever, chills, sore throat, or other symptoms of a cold or flu. Do not treat yourself. Try to avoid being around people who are sick. Avoid taking medications that contain aspirin, acetaminophen, ibuprofen, naproxen, or ketoprofen unless instructed by your care team. These medications may hide a fever. Be careful brushing or flossing your teeth or using a toothpick because you may get an infection or bleed more easily. If you have any dental work done, tell your dentist you are receiving this medication. This medication can make you more sensitive to cold. Do not drink cold drinks or use ice. Cover exposed skin before coming in contact with cold temperatures or cold objects. When out in  cold weather wear warm clothing and cover your mouth and nose to warm the air that goes into your lungs. Tell your care team if you get sensitive to the cold. Talk to your care team if you or your partner are pregnant or think either of you might be pregnant. This medication can cause serious birth defects if taken during pregnancy and for 9 months after the last dose. A negative pregnancy test is required before starting this medication. A reliable form of contraception is recommended while taking this medication and for 9 months after the last dose. Talk  to your care team about effective forms of contraception. Do not father a child while taking this medication and for 6 months after the last dose. Use a condom while having sex during this time period. Do not breastfeed while taking this medication and for 3 months after the last dose. This medication may cause infertility. Talk to your care team if you are concerned about your fertility. What side effects may I notice from receiving this medication? Side effects that you should report to your care team as soon as possible: Allergic reactions--skin rash, itching, hives, swelling of the face, lips, tongue, or throat Bleeding--bloody or black, tar-like stools, vomiting blood or Ronnie Doo material that looks like coffee grounds, red or dark [place] urine, small red or purple spots on skin, unusual bruising or bleeding Dry cough, shortness of breath or trouble breathing Heart rhythm changes--fast or irregular heartbeat, dizziness, feeling faint or lightheaded, chest pain, trouble breathing Infection--fever, chills, cough, sore throat, wounds that don't heal, pain or trouble when passing urine, general feeling of discomfort or being unwell Liver injury--right upper belly pain, loss of appetite, nausea, light-colored stool, dark yellow or Dominik Lauricella urine, yellowing skin or eyes, unusual weakness or fatigue Low red blood cell level--unusual weakness or fatigue, dizziness, headache, trouble breathing Muscle injury--unusual weakness or fatigue, muscle pain, dark yellow or Aeron Donaghey urine, decrease in amount of urine Pain, tingling, or numbness in the hands or feet Sudden and severe headache, confusion, change in vision, seizures, which may be signs of posterior reversible encephalopathy syndrome (PRES) Unusual bruising or bleeding Side effects that usually do not require medical attention (report to your care team if they continue or are bothersome): Diarrhea Nausea Pain, redness, or swelling with sores inside the mouth  or throat Unusual weakness or fatigue Vomiting This list may not describe all possible side effects. Call your doctor for medical advice about side effects. You may report side effects to FDA at 1-800-FDA-1088. Where should I keep my medication? This medication is given in a hospital or clinic. It will not be stored at home. NOTE: This sheet is a summary. It may not cover all possible information. If you have questions about this medicine, talk to your doctor, pharmacist, or health care provider.  2023 Elsevier/Gold Standard (2007-06-18 00:00:00)   Leucovorin Injection What is this medication? LEUCOVORIN (loo koe VOR in) prevents side effects from certain medications, such as methotrexate. It works by increasing folate levels. This helps protect healthy cells in your body. It may also be used to treat anemia caused by low levels of folate. It can also be used with fluorouracil, a type of chemotherapy, to treat colorectal cancer. It works by increasing the effects of fluorouracil in the body. This medicine may be used for other purposes; ask your health care provider or pharmacist if you have questions. What should I tell my care team before I take this medication? They need to  know if you have any of these conditions: Anemia from low levels of vitamin B12 in the blood An unusual or allergic reaction to leucovorin, folic acid, other medications, foods, dyes, or preservatives Pregnant or trying to get pregnant Breastfeeding How should I use this medication? This medication is injected into a vein or a muscle. It is given by your care team in a hospital or clinic setting. Talk to your care team about the use of this medication in children. Special care may be needed. Overdosage: If you think you have taken too much of this medicine contact a poison control center or emergency room at once. NOTE: This medicine is only for you. Do not share this medicine with others. What if I miss a dose? Keep  appointments for follow-up doses. It is important not to miss your dose. Call your care team if you are unable to keep an appointment. What may interact with this medication? Capecitabine Fluorouracil Phenobarbital Phenytoin Primidone Trimethoprim;sulfamethoxazole This list may not describe all possible interactions. Give your health care provider a list of all the medicines, herbs, non-prescription drugs, or dietary supplements you use. Also tell them if you smoke, drink alcohol, or use illegal drugs. Some items may interact with your medicine. What should I watch for while using this medication? Your condition will be monitored carefully while you are receiving this medication. This medication may increase the side effects of 5-fluorouracil. Tell your care team if you have diarrhea or mouth sores that do not get better or that get worse. What side effects may I notice from receiving this medication? Side effects that you should report to your care team as soon as possible: Allergic reactions--skin rash, itching, hives, swelling of the face, lips, tongue, or throat This list may not describe all possible side effects. Call your doctor for medical advice about side effects. You may report side effects to FDA at 1-800-FDA-1088. Where should I keep my medication? This medication is given in a hospital or clinic. It will not be stored at home. NOTE: This sheet is a summary. It may not cover all possible information. If you have questions about this medicine, talk to your doctor, pharmacist, or health care provider.  2023 Elsevier/Gold Standard (2021-09-30 00:00:00)        To help prevent nausea and vomiting after your treatment, we encourage you to take your nausea medication as directed.  BELOW ARE SYMPTOMS THAT SHOULD BE REPORTED IMMEDIATELY: *FEVER GREATER THAN 100.4 F (38 C) OR HIGHER *CHILLS OR SWEATING *NAUSEA AND VOMITING THAT IS NOT CONTROLLED WITH YOUR NAUSEA MEDICATION *UNUSUAL  SHORTNESS OF BREATH *UNUSUAL BRUISING OR BLEEDING *URINARY PROBLEMS (pain or burning when urinating, or frequent urination) *BOWEL PROBLEMS (unusual diarrhea, constipation, pain near the anus) TENDERNESS IN MOUTH AND THROAT WITH OR WITHOUT PRESENCE OF ULCERS (sore throat, sores in mouth, or a toothache) UNUSUAL RASH, SWELLING OR PAIN  UNUSUAL VAGINAL DISCHARGE OR ITCHING   Items with * indicate a potential emergency and should be followed up as soon as possible or go to the Emergency Department if any problems should occur.  Please show the CHEMOTHERAPY ALERT CARD or IMMUNOTHERAPY ALERT CARD at check-in to the Emergency Department and triage nurse.  Should you have questions after your visit or need to cancel or reschedule your appointment, please contact [facility] [phone]  and follow the prompts.  Office hours are 8:00 a.m. to 4:30 p.m. Monday - Friday. Please note that voicemails left after 4:00 p.m. may not  be returned until the following business day.  We are closed weekends and major holidays. You have access to a nurse at all times for urgent questions. Please call the main number to the clinic [phone] and follow the prompts.  For any non-urgent questions, you may also contact your provider using MyChart. We now offer e-Visits for anyone 64 and older to request care online for non-urgent symptoms. For details visit [email or web address].   Also download the MyChart app! Go to the app store, search "MyChart", open the app, select [facility], and log in with your MyChart username and password.  Masks are optional in the cancer centers. If you would like for your care team to wear a mask while they are taking care of you, please let them know. You may have one support person who is at least 45 years old accompany you for your appointments.

## 2022-03-19 ENCOUNTER — Other Ambulatory Visit: Payer: Self-pay | Admitting: *Deleted

## 2022-03-19 ENCOUNTER — Encounter: Payer: Self-pay | Admitting: Hematology
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
## 2022-03-20 ENCOUNTER — Inpatient Hospital Stay: Payer: Medicaid Other

## 2022-03-20 VITALS — BP 110/75 | HR 97 | Temp 97.8°F | Resp 18

## 2022-03-20 DIAGNOSIS — C20 Malignant neoplasm of rectum: Secondary | ICD-10-CM

## 2022-03-20 DIAGNOSIS — Z5111 Encounter for antineoplastic chemotherapy: Secondary | ICD-10-CM | POA: Diagnosis not present

## 2022-03-20 MED ORDER — HEPARIN SOD (PORK) LOCK FLUSH 100 UNIT/ML IV SOLN
500.0000 [IU] | Freq: Once | INTRAVENOUS | Status: AC | PRN
  Administered 2022-03-20: 500 [IU]

## 2022-03-20 MED ORDER — SODIUM CHLORIDE 0.9% FLUSH
10.0000 mL | INTRAVENOUS | Status: DC | PRN
  Administered 2022-03-20: 10 mL

## 2022-03-20 NOTE — Patient Instructions (Signed)
[facility]  Discharge Instructions: Thank you for choosing [facility] to provide your oncology and hematology care.  If you have a lab appointment with the Cancer Center, please come in thru the Main Entrance and check in at the main information desk.  Wear comfortable clothing and clothing appropriate for easy access to any Portacath or PICC line.   We strive to give you quality time with your provider. You may need to reschedule your appointment if you arrive late (15 or more minutes).  Arriving late affects you and other patients whose appointments are after yours.  Also, if you miss three or more appointments without notifying the office, you may be dismissed from the clinic at the provider's discretion.      For prescription refill requests, have your pharmacy contact our office and allow 72 hours for refills to be completed.     To help prevent nausea and vomiting after your treatment, we encourage you to take your nausea medication as directed.  BELOW ARE SYMPTOMS THAT SHOULD BE REPORTED IMMEDIATELY: *FEVER GREATER THAN 100.4 F (38 C) OR HIGHER *CHILLS OR SWEATING *NAUSEA AND VOMITING THAT IS NOT CONTROLLED WITH YOUR NAUSEA MEDICATION *UNUSUAL SHORTNESS OF BREATH *UNUSUAL BRUISING OR BLEEDING *URINARY PROBLEMS (pain or burning when urinating, or frequent urination) *BOWEL PROBLEMS (unusual diarrhea, constipation, pain near the anus) TENDERNESS IN MOUTH AND THROAT WITH OR WITHOUT PRESENCE OF ULCERS (sore throat, sores in mouth, or a toothache) UNUSUAL RASH, SWELLING OR PAIN  UNUSUAL VAGINAL DISCHARGE OR ITCHING   Items with * indicate a potential emergency and should be followed up as soon as possible or go to the Emergency Department if any problems should occur.  Please show the CHEMOTHERAPY ALERT CARD or IMMUNOTHERAPY ALERT CARD at check-in to the Emergency Department and triage nurse.  Should you have questions after your visit or need to  cancel or reschedule your appointment, please contact [facility] [phone]  and follow the prompts.  Office hours are 8:00 a.m. to 4:30 p.m. Monday - Friday. Please note that voicemails left after 4:00 p.m. may not be returned until the following business day.  We are closed weekends and major holidays. You have access to a nurse at all times for urgent questions. Please call the main number to the clinic [phone] and follow the prompts.  For any non-urgent questions, you may also contact your provider using MyChart. We now offer e-Visits for anyone 18 and older to request care online for non-urgent symptoms. For details visit [email or web address].   Also download the MyChart app! Go to the app store, search "MyChart", open the app, select [facility], and log in with your MyChart username and password.  Masks are optional in the cancer centers. If you would like for your care team to wear a mask while they are taking care of you, please let them know. You may have one support person who is at least 45 years old accompany you for your appointments.  

## 2022-03-20 NOTE — Progress Notes (Signed)
Patients port flushed without difficulty.  Good blood return noted with no bruising or swelling noted at site.  Home infusion 5FU pump disconnected.  Band aid applied.  VSS with discharge and left in satisfactory condition with no s/s of distress noted.   °

## 2022-03-24 ENCOUNTER — Other Ambulatory Visit: Payer: Self-pay | Admitting: "Endocrinology

## 2022-03-24 ENCOUNTER — Other Ambulatory Visit: Payer: Self-pay | Admitting: Cardiology

## 2022-03-24 ENCOUNTER — Other Ambulatory Visit: Payer: Self-pay | Admitting: Family Medicine

## 2022-03-24 DIAGNOSIS — E785 Hyperlipidemia, unspecified: Secondary | ICD-10-CM

## 2022-03-24 DIAGNOSIS — Z794 Long term (current) use of insulin: Secondary | ICD-10-CM

## 2022-03-24 DIAGNOSIS — I10 Essential (primary) hypertension: Secondary | ICD-10-CM

## 2022-03-24 MED ORDER — METFORMIN HCL 1000 MG PO TABS: 1000.0000 mg | ORAL_TABLET | Freq: Two times a day (BID) | ORAL | 0 refills | Status: DC

## 2022-03-24 MED ORDER — SIMVASTATIN 40 MG PO TABS: 40.0000 mg | ORAL_TABLET | Freq: Every evening | ORAL | 1 refills | Status: DC

## 2022-03-24 MED ORDER — LISINOPRIL-HYDROCHLOROTHIAZIDE 20-25 MG PO TABS: 1.0000 | ORAL_TABLET | Freq: Every day | ORAL | 1 refills | Status: DC

## 2022-03-27 ENCOUNTER — Telehealth: Payer: Self-pay | Admitting: Family Medicine

## 2022-03-27 ENCOUNTER — Other Ambulatory Visit: Payer: Self-pay

## 2022-03-27 DIAGNOSIS — I10 Essential (primary) hypertension: Secondary | ICD-10-CM

## 2022-03-27 DIAGNOSIS — E119 Type 2 diabetes mellitus without complications: Secondary | ICD-10-CM

## 2022-03-27 DIAGNOSIS — K219 Gastro-esophageal reflux disease without esophagitis: Secondary | ICD-10-CM

## 2022-03-27 DIAGNOSIS — C20 Malignant neoplasm of rectum: Secondary | ICD-10-CM | POA: Diagnosis not present

## 2022-03-27 MED ORDER — GLIPIZIDE 5 MG PO TABS: 5.0000 mg | ORAL_TABLET | Freq: Every day | ORAL | 1 refills | Status: DC

## 2022-03-27 MED ORDER — LISINOPRIL-HYDROCHLOROTHIAZIDE 20-25 MG PO TABS: 1.0000 | ORAL_TABLET | Freq: Every day | ORAL | 1 refills | Status: DC

## 2022-03-27 MED ORDER — PANTOPRAZOLE SODIUM 40 MG PO TBEC: 40.0000 mg | DELAYED_RELEASE_TABLET | Freq: Every day | ORAL | 3 refills | Status: DC

## 2022-03-27 NOTE — Telephone Encounter (Signed)
Refills sent

## 2022-03-27 NOTE — Telephone Encounter (Signed)
Patient called need med refills    lisinopril-hydrochlorothiazide (ZESTORETIC) 20-25 MG tablet [[id number]]   glipiZIDE (GLUCOTROL) 5 MG tablet [[id number]]   pantoprazole (PROTONIX) 40 MG tablet [[id number]]   Pharmacy  [place], [place] - [id number] [place], [place] [address] Phone: [phone]  Fax: [phone] DEA #: --

## 2022-03-30 ENCOUNTER — Inpatient Hospital Stay: Payer: Medicaid Other

## 2022-03-30 ENCOUNTER — Inpatient Hospital Stay: Payer: Medicaid Other | Admitting: Hematology

## 2022-03-30 VITALS — BP 119/80 | HR 87 | Temp 98.2°F | Resp 18

## 2022-03-30 DIAGNOSIS — Z5111 Encounter for antineoplastic chemotherapy: Secondary | ICD-10-CM | POA: Diagnosis not present

## 2022-03-30 DIAGNOSIS — C20 Malignant neoplasm of rectum: Secondary | ICD-10-CM

## 2022-03-30 LAB — COMPREHENSIVE METABOLIC PANEL
ALT: 30 U/L (ref 0–44)
AST: 24 U/L (ref 15–41)
Albumin: 4.1 g/dL (ref 3.5–5.0)
Alkaline Phosphatase: 75 U/L (ref 38–126)
Anion gap: 8 (ref 5–15)
BUN: 12 mg/dL (ref 6–20)
CO2: 24 mmol/L (ref 22–32)
Calcium: 9.2 mg/dL (ref 8.9–10.3)
Chloride: 100 mmol/L (ref 98–111)
Creatinine, Ser: 0.75 mg/dL (ref 0.61–1.24)
GFR, Estimated: >60 mL/min (ref >60)
Glucose, Bld: 151 mg/dL — ABNORMAL HIGH (ref 70–99)
Potassium: 3.2 mmol/L — ABNORMAL LOW (ref 3.5–5.1)
Sodium: 132 mmol/L — ABNORMAL LOW (ref 135–145)
Total Bilirubin: 0.8 mg/dL (ref 0.3–1.2)
Total Protein: 7.3 g/dL (ref 6.5–8.1)

## 2022-03-30 LAB — CBC WITH DIFFERENTIAL/PLATELET
Abs Immature Granulocytes: 0.02 10*3/uL (ref 0.00–0.07)
Basophils Absolute: 0.1 10*3/uL (ref 0.0–0.1)
Basophils Relative: 1 %
Eosinophils Absolute: 0.2 10*3/uL (ref 0.0–0.5)
Eosinophils Relative: 3 %
HCT: 39.3 % (ref 39.0–52.0)
Hemoglobin: 14.1 g/dL (ref 13.0–17.0)
Immature Granulocytes: 0 %
Lymphocytes Relative: 24 %
Lymphs Abs: 1.6 10*3/uL (ref 0.7–4.0)
MCH: 29.9 pg (ref 26.0–34.0)
MCHC: 35.9 g/dL (ref 30.0–36.0)
MCV: 83.3 fL (ref 80.0–100.0)
Monocytes Absolute: 1.1 10*3/uL — ABNORMAL HIGH (ref 0.1–1.0)
Monocytes Relative: 16 %
Neutro Abs: 3.6 10*3/uL (ref 1.7–7.7)
Neutrophils Relative %: 56 %
Platelets: 269 10*3/uL (ref 150–400)
RBC: 4.72 MIL/uL (ref 4.22–5.81)
RDW: 13 % (ref 11.5–15.5)
WBC: 6.5 10*3/uL (ref 4.0–10.5)
nRBC: 0 % (ref 0.0–0.2)

## 2022-03-30 LAB — MAGNESIUM: Magnesium: 2 mg/dL (ref 1.7–2.4)

## 2022-03-30 MED ORDER — POTASSIUM CHLORIDE CRYS ER 20 MEQ PO TBCR
40.0000 meq | EXTENDED_RELEASE_TABLET | Freq: Once | ORAL | Status: AC
  Administered 2022-03-30: 40 meq via ORAL
  Filled 2022-03-30: qty 2

## 2022-03-30 MED ORDER — SODIUM CHLORIDE 0.9 % IV SOLN
5000.0000 mg | INTRAVENOUS | Status: DC
  Administered 2022-03-30: 5000 mg via INTRAVENOUS
  Filled 2022-03-30: qty 100

## 2022-03-30 MED ORDER — FLUOROURACIL CHEMO INJECTION 2.5 GM/50ML
400.0000 mg/m2 | Freq: Once | INTRAVENOUS | Status: AC
  Administered 2022-03-30: 900 mg via INTRAVENOUS
  Filled 2022-03-30: qty 18

## 2022-03-30 MED ORDER — OXALIPLATIN CHEMO INJECTION 100 MG/20ML
85.0000 mg/m2 | Freq: Once | INTRAVENOUS | Status: AC
  Administered 2022-03-30: 195 mg via INTRAVENOUS
  Filled 2022-03-30: qty 39

## 2022-03-30 MED ORDER — LEUCOVORIN CALCIUM INJECTION 350 MG
400.0000 mg/m2 | Freq: Once | INTRAVENOUS | Status: AC
  Administered 2022-03-30: 924 mg via INTRAVENOUS
  Filled 2022-03-30: qty 46.2

## 2022-03-30 MED ORDER — SODIUM CHLORIDE 0.9 % IV SOLN
10.0000 mg | Freq: Once | INTRAVENOUS | Status: AC
  Administered 2022-03-30: 10 mg via INTRAVENOUS
  Filled 2022-03-30: qty 10

## 2022-03-30 MED ORDER — PALONOSETRON HCL INJECTION 0.25 MG/5ML
0.2500 mg | Freq: Once | INTRAVENOUS | Status: AC
  Administered 2022-03-30: 0.25 mg via INTRAVENOUS
  Filled 2022-03-30: qty 5

## 2022-03-30 MED ORDER — DEXTROSE 5 % IV SOLN: Freq: Once | INTRAVENOUS | Status: AC

## 2022-03-30 NOTE — Patient Instructions (Signed)
[facility]  Discharge Instructions: Thank you for choosing [facility] to provide your oncology and hematology care.  If you have a lab appointment with the [facility], please come in thru the Main Entrance and check in at the main information desk.  Wear comfortable clothing and clothing appropriate for easy access to any Portacath or PICC line.   We strive to give you quality time with your provider. You may need to reschedule your appointment if you arrive late (15 or more minutes).  Arriving late affects you and other patients whose appointments are after yours.  Also, if you miss three or more appointments without notifying the office, you may be dismissed from the clinic at the provider's discretion.      For prescription refill requests, have your pharmacy contact our office and allow 72 hours for refills to be completed.    Today you received the following chemotherapy and/or immunotherapy agents Folfox  To help prevent nausea and vomiting after your treatment, we encourage you to take your nausea medication as directed. Oxaliplatin Injection What is this medication? OXALIPLATIN (ox AL i PLA tin) treats some types of cancer. It works by slowing down the growth of cancer cells. This medicine may be used for other purposes; ask your health care provider or pharmacist if you have questions. COMMON BRAND NAME(S): Eloxatin What should I tell my care team before I take this medication? They need to know if you have any of these conditions: Heart disease History of irregular heartbeat or rhythm Liver disease Low blood cell levels (white cells, red cells, and platelets) Lung or breathing disease, such as asthma Take medications that treat or prevent blood clots Tingling of the fingers, toes, or other nerve disorder An unusual or allergic reaction to oxaliplatin, other medications, foods, dyes, or preservatives If you or your partner are pregnant or  trying to get pregnant Breast-feeding How should I use this medication? This medication is injected into a vein. It is given by your care team in a hospital or clinic setting. Talk to your care team about the use of this medication in children. Special care may be needed. Overdosage: If you think you have taken too much of this medicine contact a poison control center or emergency room at once. NOTE: This medicine is only for you. Do not share this medicine with others. What if I miss a dose? Keep appointments for follow-up doses. It is important not to miss a dose. Call your care team if you are unable to keep an appointment. What may interact with this medication? Do not take this medication with any of the following: Cisapride Dronedarone Pimozide Thioridazine This medication may also interact with the following: Aspirin and aspirin-like medications Certain medications that treat or prevent blood clots, such as warfarin, apixaban, dabigatran, and rivaroxaban Cisplatin Cyclosporine Diuretics Medications for infection, such as acyclovir, adefovir, amphotericin B, bacitracin, cidofovir, foscarnet, ganciclovir, gentamicin, pentamidine, vancomycin NSAIDs, medications for pain and inflammation, such as ibuprofen or naproxen Other medications that cause heart rhythm changes Pamidronate Zoledronic acid This list may not describe all possible interactions. Give your health care provider a list of all the medicines, herbs, non-prescription drugs, or dietary supplements you use. Also tell them if you smoke, drink alcohol, or use illegal drugs. Some items may interact with your medicine. What should I watch for while using this medication? Your condition will be monitored carefully while you are receiving this medication. You may need blood work  while taking this medication. This medication may make you feel generally unwell. This is not uncommon as chemotherapy can affect healthy cells as well  as cancer cells. Report any side effects. Continue your course of treatment even though you feel ill unless your care team tells you to stop. This medication may increase your risk of getting an infection. Call your care team for advice if you get a fever, chills, sore throat, or other symptoms of a cold or flu. Do not treat yourself. Try to avoid being around people who are sick. Avoid taking medications that contain aspirin, acetaminophen, ibuprofen, naproxen, or ketoprofen unless instructed by your care team. These medications may hide a fever. Be careful brushing or flossing your teeth or using a toothpick because you may get an infection or bleed more easily. If you have any dental work done, tell your dentist you are receiving this medication. This medication can make you more sensitive to cold. Do not drink cold drinks or use ice. Cover exposed skin before coming in contact with cold temperatures or cold objects. When out in cold weather wear warm clothing and cover your mouth and nose to warm the air that goes into your lungs. Tell your care team if you get sensitive to the cold. Talk to your care team if you or your partner are pregnant or think either of you might be pregnant. This medication can cause serious birth defects if taken during pregnancy and for 9 months after the last dose. A negative pregnancy test is required before starting this medication. A reliable form of contraception is recommended while taking this medication and for 9 months after the last dose. Talk to your care team about effective forms of contraception. Do not father a child while taking this medication and for 6 months after the last dose. Use a condom while having sex during this time period. Do not breastfeed while taking this medication and for 3 months after the last dose. This medication may cause infertility. Talk to your care team if you are concerned about your fertility. What side effects may I notice from  receiving this medication? Side effects that you should report to your care team as soon as possible: Allergic reactions--skin rash, itching, hives, swelling of the face, lips, tongue, or throat Bleeding--bloody or black, tar-like stools, vomiting blood or brown material that looks like coffee grounds, red or dark brown urine, small red or purple spots on skin, unusual bruising or bleeding Dry cough, shortness of breath or trouble breathing Heart rhythm changes--fast or irregular heartbeat, dizziness, feeling faint or lightheaded, chest pain, trouble breathing Infection--fever, chills, cough, sore throat, wounds that don't heal, pain or trouble when passing urine, general feeling of discomfort or being unwell Liver injury--right upper belly pain, loss of appetite, nausea, light-colored stool, dark yellow or brown urine, yellowing skin or eyes, unusual weakness or fatigue Low red blood cell level--unusual weakness or fatigue, dizziness, headache, trouble breathing Muscle injury--unusual weakness or fatigue, muscle pain, dark yellow or brown urine, decrease in amount of urine Pain, tingling, or numbness in the hands or feet Sudden and severe headache, confusion, change in vision, seizures, which may be signs of posterior reversible encephalopathy syndrome (PRES) Unusual bruising or bleeding Side effects that usually do not require medical attention (report to your care team if they continue or are bothersome): Diarrhea Nausea Pain, redness, or swelling with sores inside the mouth or throat Unusual weakness or fatigue Vomiting This list may not describe all possible side  effects. Call your doctor for medical advice about side effects. You may report side effects to FDA at 1-800-FDA-1088. Where should I keep my medication? This medication is given in a hospital or clinic. It will not be stored at home. NOTE: This sheet is a summary. It may not cover all possible information. If you have questions  about this medicine, talk to your doctor, pharmacist, or health care provider.  2023 Elsevier/Gold Standard (2007-06-18 00:00:00)  Leucovorin Injection What is this medication? LEUCOVORIN (loo koe VOR in) prevents side effects from certain medications, such as methotrexate. It works by increasing folate levels. This helps protect healthy cells in your body. It may also be used to treat anemia caused by low levels of folate. It can also be used with fluorouracil, a type of chemotherapy, to treat colorectal cancer. It works by increasing the effects of fluorouracil in the body. This medicine may be used for other purposes; ask your health care provider or pharmacist if you have questions. What should I tell my care team before I take this medication? They need to know if you have any of these conditions: Anemia from low levels of vitamin B12 in the blood An unusual or allergic reaction to leucovorin, folic acid, other medications, foods, dyes, or preservatives Pregnant or trying to get pregnant Breastfeeding How should I use this medication? This medication is injected into a vein or a muscle. It is given by your care team in a hospital or clinic setting. Talk to your care team about the use of this medication in children. Special care may be needed. Overdosage: If you think you have taken too much of this medicine contact a poison control center or emergency room at once. NOTE: This medicine is only for you. Do not share this medicine with others. What if I miss a dose? Keep appointments for follow-up doses. It is important not to miss your dose. Call your care team if you are unable to keep an appointment. What may interact with this medication? Capecitabine Fluorouracil Phenobarbital Phenytoin Primidone Trimethoprim;sulfamethoxazole This list may not describe all possible interactions. Give your health care provider a list of all the medicines, herbs, non-prescription drugs, or dietary  supplements you use. Also tell them if you smoke, drink alcohol, or use illegal drugs. Some items may interact with your medicine. What should I watch for while using this medication? Your condition will be monitored carefully while you are receiving this medication. This medication may increase the side effects of 5-fluorouracil. Tell your care team if you have diarrhea or mouth sores that do not get better or that get worse. What side effects may I notice from receiving this medication? Side effects that you should report to your care team as soon as possible: Allergic reactions--skin rash, itching, hives, swelling of the face, lips, tongue, or throat This list may not describe all possible side effects. Call your doctor for medical advice about side effects. You may report side effects to FDA at 1-800-FDA-1088. Where should I keep my medication? This medication is given in a hospital or clinic. It will not be stored at home. NOTE: This sheet is a summary. It may not cover all possible information. If you have questions about this medicine, talk to your doctor, pharmacist, or health care provider.  2023 Elsevier/Gold Standard (2021-09-30 00:00:00)  Fluorouracil Injection What is this medication? FLUOROURACIL (flure oh YOOR a sil) treats some types of cancer. It works by slowing down the growth of cancer cells. This medicine  may be used for other purposes; ask your health care provider or pharmacist if you have questions. COMMON BRAND NAME(S): Adrucil What should I tell my care team before I take this medication? They need to know if you have any of these conditions: Blood disorders Dihydropyrimidine dehydrogenase (DPD) deficiency Infection, such as chickenpox, cold sores, herpes Kidney disease Liver disease Poor nutrition Recent or ongoing radiation therapy An unusual or allergic reaction to fluorouracil, other medications, foods, dyes, or preservatives If you or your partner are  pregnant or trying to get pregnant Breast-feeding How should I use this medication? This medication is injected into a vein. It is administered by your care team in a hospital or clinic setting. Talk to your care team about the use of this medication in children. Special care may be needed. Overdosage: If you think you have taken too much of this medicine contact a poison control center or emergency room at once. NOTE: This medicine is only for you. Do not share this medicine with others. What if I miss a dose? Keep appointments for follow-up doses. It is important not to miss your dose. Call your care team if you are unable to keep an appointment. What may interact with this medication? Do not take this medication with any of the following: Live virus vaccines This medication may also interact with the following: Medications that treat or prevent blood clots, such as warfarin, enoxaparin, dalteparin This list may not describe all possible interactions. Give your health care provider a list of all the medicines, herbs, non-prescription drugs, or dietary supplements you use. Also tell them if you smoke, drink alcohol, or use illegal drugs. Some items may interact with your medicine. What should I watch for while using this medication? Your condition will be monitored carefully while you are receiving this medication. This medication may make you feel generally unwell. This is not uncommon as chemotherapy can affect healthy cells as well as cancer cells. Report any side effects. Continue your course of treatment even though you feel ill unless your care team tells you to stop. In some cases, you may be given additional medications to help with side effects. Follow all directions for their use. This medication may increase your risk of getting an infection. Call your care team for advice if you get a fever, chills, sore throat, or other symptoms of a cold or flu. Do not treat yourself. Try to avoid  being around people who are sick. This medication may increase your risk to bruise or bleed. Call your care team if you notice any unusual bleeding. Be careful brushing or flossing your teeth or using a toothpick because you may get an infection or bleed more easily. If you have any dental work done, tell your dentist you are receiving this medication. Avoid taking medications that contain aspirin, acetaminophen, ibuprofen, naproxen, or ketoprofen unless instructed by your care team. These medications may hide a fever. Do not treat diarrhea with over the counter products. Contact your care team if you have diarrhea that lasts more than 2 days or if it is severe and watery. This medication can make you more sensitive to the sun. Keep out of the sun. If you cannot avoid being in the sun, wear protective clothing and sunscreen. Do not use sun lamps, tanning beds, or tanning booths. Talk to your care team if you or your partner wish to become pregnant or think you might be pregnant. This medication can cause serious birth defects if taken during  pregnancy and for 3 months after the last dose. A reliable form of contraception is recommended while taking this medication and for 3 months after the last dose. Talk to your care team about effective forms of contraception. Do not father a child while taking this medication and for 3 months after the last dose. Use a condom while having sex during this time period. Do not breastfeed while taking this medication. This medication may cause infertility. Talk to your care team if you are concerned about your fertility. What side effects may I notice from receiving this medication? Side effects that you should report to your care team as soon as possible: Allergic reactions--skin rash, itching, hives, swelling of the face, lips, tongue, or throat Heart attack--pain or tightness in the chest, shoulders, arms, or jaw, nausea, shortness of breath, cold or clammy skin,  feeling faint or lightheaded Heart failure--shortness of breath, swelling of the ankles, feet, or hands, sudden weight gain, unusual weakness or fatigue Heart rhythm changes--fast or irregular heartbeat, dizziness, feeling faint or lightheaded, chest pain, trouble breathing High ammonia level--unusual weakness or fatigue, confusion, loss of appetite, nausea, vomiting, seizures Infection--fever, chills, cough, sore throat, wounds that don't heal, pain or trouble when passing urine, general feeling of discomfort or being unwell Low red blood cell level--unusual weakness or fatigue, dizziness, headache, trouble breathing Pain, tingling, or numbness in the hands or feet, muscle weakness, change in vision, confusion or trouble speaking, loss of balance or coordination, trouble walking, seizures Redness, swelling, and blistering of the skin over hands and feet Severe or prolonged diarrhea Unusual bruising or bleeding Side effects that usually do not require medical attention (report to your care team if they continue or are bothersome): Dry skin Headache Increased tears Nausea Pain, redness, or swelling with sores inside the mouth or throat Sensitivity to light Vomiting This list may not describe all possible side effects. Call your doctor for medical advice about side effects. You may report side effects to FDA at 1-800-FDA-1088. Where should I keep my medication? This medication is given in a hospital or clinic. It will not be stored at home. NOTE: This sheet is a summary. It may not cover all possible information. If you have questions about this medicine, talk to your doctor, pharmacist, or health care provider.  2023 Elsevier/Gold Standard (2021-08-26 00:00:00)      BELOW ARE SYMPTOMS THAT SHOULD BE REPORTED IMMEDIATELY: *FEVER GREATER THAN 100.4 F (38 C) OR HIGHER *CHILLS OR SWEATING *NAUSEA AND VOMITING THAT IS NOT CONTROLLED WITH YOUR NAUSEA MEDICATION *UNUSUAL SHORTNESS OF  BREATH *UNUSUAL BRUISING OR BLEEDING *URINARY PROBLEMS (pain or burning when urinating, or frequent urination) *BOWEL PROBLEMS (unusual diarrhea, constipation, pain near the anus) TENDERNESS IN MOUTH AND THROAT WITH OR WITHOUT PRESENCE OF ULCERS (sore throat, sores in mouth, or a toothache) UNUSUAL RASH, SWELLING OR PAIN  UNUSUAL VAGINAL DISCHARGE OR ITCHING   Items with * indicate a potential emergency and should be followed up as soon as possible or go to the Emergency Department if any problems should occur.  Please show the CHEMOTHERAPY ALERT CARD or IMMUNOTHERAPY ALERT CARD at check-in to the Emergency Department and triage nurse.  Should you have questions after your visit or need to cancel or reschedule your appointment, please contact [facility] [phone]  and follow the prompts.  Office hours are 8:00 a.m. to 4:30 p.m. Monday - Friday. Please note that voicemails left after 4:00 p.m. may not be returned until the following business  day.  We are closed weekends and major holidays. You have access to a nurse at all times for urgent questions. Please call the main number to the clinic [phone] and follow the prompts.  For any non-urgent questions, you may also contact your provider using MyChart. We now offer e-Visits for anyone 94 and older to request care online for non-urgent symptoms. For details visit [email or web address].   Also download the MyChart app! Go to the app store, search "MyChart", open the app, select [facility], and log in with your MyChart username and password.  Masks are optional in the cancer centers. If you would like for your care team to wear a mask while they are taking care of you, please let them know. You may have one support person who is at least 45 years old accompany you for your appointments.
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
## 2022-03-30 NOTE — Progress Notes (Signed)
Pt presents today for Folfox per provider's order. Vital signs and other labs WNL for treatment. Pt's potassium is 3.2 today. Pt will receive 40 mEq potassium chloride p.o x 1 dose. Okay to proceed with treatment today pe Dr.K.  Folfox and 40 mEq potassium chloride p.o x 1 dose given today per MD orders. Tolerated infusion without adverse affects. Vital signs stable. No complaints at this time. Discharged from clinic ambulatory in stable condition. Alert and oriented x 3. F/U with [facility] as scheduled.

## 2022-03-30 NOTE — Patient Instructions (Addendum)
[place] at [facility] Discharge Instructions   You were seen and examined today by Dr. Delton Coombes.  He reviewed the results of your lab work which are normal/stable. Your potassium is slightly low at 3.2. We will give you potassium pills in the clinic today to correct this.   We will proceed with your treatment today.   Return as scheduled.    Thank you for choosing [place] at [facility] to provide your oncology and hematology care.  To afford each patient quality time with our provider, please arrive at least 15 minutes before your scheduled appointment time.   If you have a lab appointment with the [facility] please come in thru the Main Entrance and check in at the main information desk.  You need to re-schedule your appointment should you arrive 10 or more minutes late.  We strive to give you quality time with our providers, and arriving late affects you and other patients whose appointments are after yours.  Also, if you no show three or more times for appointments you may be dismissed from the clinic at the providers discretion.     Again, thank you for choosing [facility].  Our hope is that these requests will decrease the amount of time that you wait before being seen by our physicians.       _____________________________________________________________  Should you have questions after your visit to [facility], please contact our office at [phone] and follow the prompts.  Our office hours are 8:00 a.m. and 4:30 p.m. Monday - Friday.  Please note that voicemails left after 4:00 p.m. may not be returned until the following business day.  We are closed weekends and major holidays.  You do have access to a nurse 24-7, just call the main number to the clinic [phone] and do not press any options, hold on the line and a nurse will answer the phone.    For prescription refill requests, have your  pharmacy contact our office and allow 72 hours.    Due to Covid, you will need to wear a mask upon entering the hospital. If you do not have a mask, a mask will be given to you at the Main Entrance upon arrival. For doctor visits, patients may have 1 support person age 57 or older with them. For treatment visits, patients can not have anyone with them due to social distancing guidelines and our immunocompromised population.

## 2022-03-30 NOTE — Progress Notes (Signed)
Patient has been examined by Dr. Katragadda, and vital signs and labs have been reviewed. ANC, Creatinine, LFTs, hemoglobin, and platelets are within treatment parameters per M.D. - pt may proceed with treatment.  Primary RN and pharmacy notified.  

## 2022-03-31 ENCOUNTER — Ambulatory Visit: Payer: Medicaid Other | Admitting: "Endocrinology

## 2022-03-31 ENCOUNTER — Other Ambulatory Visit: Payer: Self-pay

## 2022-03-31 LAB — CEA: CEA: 2.9 ng/mL (ref 0.0–4.7)

## 2022-04-01 ENCOUNTER — Inpatient Hospital Stay: Payer: Medicaid Other

## 2022-04-01 VITALS — BP 123/82 | HR 62 | Temp 98.7°F | Resp 16

## 2022-04-01 DIAGNOSIS — C20 Malignant neoplasm of rectum: Secondary | ICD-10-CM

## 2022-04-01 DIAGNOSIS — Z5111 Encounter for antineoplastic chemotherapy: Secondary | ICD-10-CM | POA: Diagnosis not present

## 2022-04-01 MED ORDER — NYSTATIN 100000 UNIT/ML MT SUSP: 5.0000 mL | Freq: Four times a day (QID) | OROMUCOSAL | 1 refills | Status: DC

## 2022-04-01 MED ORDER — SODIUM CHLORIDE 0.9% FLUSH
10.0000 mL | INTRAVENOUS | Status: DC | PRN
  Administered 2022-04-01: 10 mL

## 2022-04-01 MED ORDER — HEPARIN SOD (PORK) LOCK FLUSH 100 UNIT/ML IV SOLN
500.0000 [IU] | Freq: Once | INTRAVENOUS | Status: AC | PRN
  Administered 2022-04-01: 500 [IU]

## 2022-04-01 NOTE — Progress Notes (Signed)
Patient for pump d/c.  Stated his tongue felt "weird and numb".  Tongue appeared white in color but no white patches inside cheeks or back of throat.  Stated he could swallow liquids and foods fine and denied sore throat.  Patient stated this happens with each treatment and feels better a few days before his next treatment.  Reviewed with Dr. Delton Coombes with verbal order Nystatin swish and swallow.  Orders entered and sent to pharmacy.  Reviewed directions with the patient with understanding verbalized.    Patient for chemotherapy pump disconnect with no complaints voiced.  Patients port flushed without difficulty.  Good blood return noted with no bruising or swelling noted at site.  Band aid applied.  VSS with discharge and left ambulatory with no s/s of distress noted.

## 2022-04-01 NOTE — Patient Instructions (Signed)
[facility] [place]  Discharge Instructions: Thank you for choosing [facility] to provide your oncology and hematology care.  If you have a lab appointment with the Cancer Center, please come in thru the Main Entrance and check in at the main information desk.  Wear comfortable clothing and clothing appropriate for easy access to any Portacath or PICC line.   We strive to give you quality time with your provider. You may need to reschedule your appointment if you arrive late (15 or more minutes).  Arriving late affects you and other patients whose appointments are after yours.  Also, if you miss three or more appointments without notifying the office, you may be dismissed from the clinic at the provider's discretion.      For prescription refill requests, have your pharmacy contact our office and allow 72 hours for refills to be completed.     To help prevent nausea and vomiting after your treatment, we encourage you to take your nausea medication as directed.  BELOW ARE SYMPTOMS THAT SHOULD BE REPORTED IMMEDIATELY: *FEVER GREATER THAN 100.4 F (38 C) OR HIGHER *CHILLS OR SWEATING *NAUSEA AND VOMITING THAT IS NOT CONTROLLED WITH YOUR NAUSEA MEDICATION *UNUSUAL SHORTNESS OF BREATH *UNUSUAL BRUISING OR BLEEDING *URINARY PROBLEMS (pain or burning when urinating, or frequent urination) *BOWEL PROBLEMS (unusual diarrhea, constipation, pain near the anus) TENDERNESS IN MOUTH AND THROAT WITH OR WITHOUT PRESENCE OF ULCERS (sore throat, sores in mouth, or a toothache) UNUSUAL RASH, SWELLING OR PAIN  UNUSUAL VAGINAL DISCHARGE OR ITCHING   Items with * indicate a potential emergency and should be followed up as soon as possible or go to the Emergency Department if any problems should occur.  Please show the CHEMOTHERAPY ALERT CARD or IMMUNOTHERAPY ALERT CARD at check-in to the Emergency Department and triage nurse.  Should you have questions after your visit or need to  cancel or reschedule your appointment, please contact [facility] AT  [phone]  and follow the prompts.  Office hours are 8:00 a.m. to 4:30 p.m. Monday - Friday. Please note that voicemails left after 4:00 p.m. may not be returned until the following business day.  We are closed weekends and major holidays. You have access to a nurse at all times for urgent questions. Please call the main number to the clinic [phone] and follow the prompts.  For any non-urgent questions, you may also contact your provider using MyChart. We now offer e-Visits for anyone 18 and older to request care online for non-urgent symptoms. For details visit [email or web address].   Also download the MyChart app! Go to the app store, search "MyChart", open the app, select [place], and log in with your MyChart username and password.  Masks are optional in the cancer centers. If you would like for your care team to wear a mask while they are taking care of you, please let them know. You may have one support person who is at least 45 years old accompany you for your appointments.  

## 2022-04-08 ENCOUNTER — Ambulatory Visit: Payer: Medicaid Other | Admitting: Family Medicine

## 2022-04-08 ENCOUNTER — Encounter: Payer: Self-pay | Admitting: Family Medicine

## 2022-04-15 ENCOUNTER — Inpatient Hospital Stay: Payer: Medicaid Other

## 2022-04-15 ENCOUNTER — Inpatient Hospital Stay: Payer: Medicaid Other | Attending: Hematology

## 2022-04-15 ENCOUNTER — Inpatient Hospital Stay (HOSPITAL_BASED_OUTPATIENT_CLINIC_OR_DEPARTMENT_OTHER): Payer: Medicaid Other | Admitting: Hematology

## 2022-04-15 VITALS — BP 128/74 | HR 78 | Temp 98.2°F | Resp 18

## 2022-04-15 DIAGNOSIS — Z5111 Encounter for antineoplastic chemotherapy: Secondary | ICD-10-CM | POA: Insufficient documentation

## 2022-04-15 DIAGNOSIS — C20 Malignant neoplasm of rectum: Secondary | ICD-10-CM

## 2022-04-15 DIAGNOSIS — E876 Hypokalemia: Secondary | ICD-10-CM | POA: Diagnosis not present

## 2022-04-15 DIAGNOSIS — F17290 Nicotine dependence, other tobacco product, uncomplicated: Secondary | ICD-10-CM | POA: Diagnosis not present

## 2022-04-15 DIAGNOSIS — E1142 Type 2 diabetes mellitus with diabetic polyneuropathy: Secondary | ICD-10-CM | POA: Insufficient documentation

## 2022-04-15 DIAGNOSIS — I10 Essential (primary) hypertension: Secondary | ICD-10-CM | POA: Diagnosis not present

## 2022-04-15 LAB — CBC WITH DIFFERENTIAL/PLATELET
Abs Immature Granulocytes: 0.01 10*3/uL (ref 0.00–0.07)
Basophils Absolute: 0.1 10*3/uL (ref 0.0–0.1)
Basophils Relative: 2 %
Eosinophils Absolute: 0.2 10*3/uL (ref 0.0–0.5)
Eosinophils Relative: 6 %
HCT: 37.2 % — ABNORMAL LOW (ref 39.0–52.0)
Hemoglobin: 13.1 g/dL (ref 13.0–17.0)
Immature Granulocytes: 0 %
Lymphocytes Relative: 34 %
Lymphs Abs: 1.4 10*3/uL (ref 0.7–4.0)
MCH: 30 pg (ref 26.0–34.0)
MCHC: 35.2 g/dL (ref 30.0–36.0)
MCV: 85.1 fL (ref 80.0–100.0)
Monocytes Absolute: 0.8 10*3/uL (ref 0.1–1.0)
Monocytes Relative: 19 %
Neutro Abs: 1.7 10*3/uL (ref 1.7–7.7)
Neutrophils Relative %: 39 %
Platelets: 214 10*3/uL (ref 150–400)
RBC: 4.37 MIL/uL (ref 4.22–5.81)
RDW: 14.5 % (ref 11.5–15.5)
WBC: 4.2 10*3/uL (ref 4.0–10.5)
nRBC: 0 % (ref 0.0–0.2)

## 2022-04-15 LAB — MAGNESIUM: Magnesium: 2.3 mg/dL (ref 1.7–2.4)

## 2022-04-15 LAB — COMPREHENSIVE METABOLIC PANEL
ALT: 41 U/L (ref 0–44)
AST: 32 U/L (ref 15–41)
Albumin: 3.8 g/dL (ref 3.5–5.0)
Alkaline Phosphatase: 71 U/L (ref 38–126)
Anion gap: 8 (ref 5–15)
BUN: 11 mg/dL (ref 6–20)
CO2: 26 mmol/L (ref 22–32)
Calcium: 9 mg/dL (ref 8.9–10.3)
Chloride: 100 mmol/L (ref 98–111)
Creatinine, Ser: 0.8 mg/dL (ref 0.61–1.24)
GFR, Estimated: >60 mL/min (ref >60)
Glucose, Bld: 88 mg/dL (ref 70–99)
Potassium: 4.1 mmol/L (ref 3.5–5.1)
Sodium: 134 mmol/L — ABNORMAL LOW (ref 135–145)
Total Bilirubin: 1.1 mg/dL (ref 0.3–1.2)
Total Protein: 6.5 g/dL (ref 6.5–8.1)

## 2022-04-15 MED ORDER — DEXTROSE 5 % IV SOLN: Freq: Once | INTRAVENOUS | Status: AC

## 2022-04-15 MED ORDER — LEUCOVORIN CALCIUM INJECTION 350 MG
400.0000 mg/m2 | Freq: Once | INTRAVENOUS | Status: AC
  Administered 2022-04-15: 924 mg via INTRAVENOUS
  Filled 2022-04-15: qty 46.2

## 2022-04-15 MED ORDER — FLUOROURACIL CHEMO INJECTION 2.5 GM/50ML
400.0000 mg/m2 | Freq: Once | INTRAVENOUS | Status: AC
  Administered 2022-04-15: 900 mg via INTRAVENOUS
  Filled 2022-04-15: qty 18

## 2022-04-15 MED ORDER — SODIUM CHLORIDE 0.9 % IV SOLN
2175.0000 mg/m2 | INTRAVENOUS | Status: DC
  Administered 2022-04-15: 5000 mg via INTRAVENOUS
  Filled 2022-04-15: qty 100

## 2022-04-15 MED ORDER — SODIUM CHLORIDE 0.9% FLUSH
10.0000 mL | INTRAVENOUS | Status: DC | PRN
  Administered 2022-04-15: 10 mL via INTRAVENOUS

## 2022-04-15 MED ORDER — SODIUM CHLORIDE 0.9 % IV SOLN
10.0000 mg | Freq: Once | INTRAVENOUS | Status: AC
  Administered 2022-04-15: 10 mg via INTRAVENOUS
  Filled 2022-04-15: qty 10

## 2022-04-15 MED ORDER — PALONOSETRON HCL INJECTION 0.25 MG/5ML
0.2500 mg | Freq: Once | INTRAVENOUS | Status: AC
  Administered 2022-04-15: 0.25 mg via INTRAVENOUS
  Filled 2022-04-15: qty 5

## 2022-04-15 MED ORDER — OXALIPLATIN CHEMO INJECTION 100 MG/20ML
86.0000 mg/m2 | Freq: Once | INTRAVENOUS | Status: AC
  Administered 2022-04-15: 200 mg via INTRAVENOUS
  Filled 2022-04-15: qty 40

## 2022-04-15 NOTE — Progress Notes (Signed)
Patient has been examined by Dr. Katragadda, and vital signs and labs have been reviewed. ANC, Creatinine, LFTs, hemoglobin, and platelets are within treatment parameters per M.D. - pt may proceed with treatment.  Primary RN and pharmacy notified.  

## 2022-04-15 NOTE — Progress Notes (Signed)
Patients port flushed without difficulty.  Good blood return noted with no bruising or swelling noted at site.  Patient remains accessed for chemotherapy treatment.  

## 2022-04-15 NOTE — Patient Instructions (Addendum)
[facility] at [facility] Discharge Instructions   You were seen and examined today by Dr. Katragadda.  He reviewed the results of your lab work which are normal/stable.   We will proceed with your treatment today.  Return as scheduled.    Thank you for choosing [facility] at [facility] to provide your oncology and hematology care.  To afford each patient quality time with our provider, please arrive at least 15 minutes before your scheduled appointment time.   If you have a lab appointment with the Cancer Center please come in thru the Main Entrance and check in at the main information desk.  You need to re-schedule your appointment should you arrive 10 or more minutes late.  We strive to give you quality time with our providers, and arriving late affects you and other patients whose appointments are after yours.  Also, if you no show three or more times for appointments you may be dismissed from the clinic at the providers discretion.     Again, thank you for choosing [facility].  Our hope is that these requests will decrease the amount of time that you wait before being seen by our physicians.       _____________________________________________________________  Should you have questions after your visit to [facility], please contact our office at [phone] and follow the prompts.  Our office hours are 8:00 a.m. and 4:30 p.m. Monday - Friday.  Please note that voicemails left after 4:00 p.m. may not be returned until the following business day.  We are closed weekends and major holidays.  You do have access to a nurse 24-7, just call the main number to the clinic [phone] and do not press any options, hold on the line and a nurse will answer the phone.    For prescription refill requests, have your pharmacy contact our office and allow 72 hours.    Due to Covid, you will need to wear a mask upon entering  the hospital. If you do not have a mask, a mask will be given to you at the Main Entrance upon arrival. For doctor visits, patients may have 1 support person age 18 or older with them. For treatment visits, patients can not have anyone with them due to social distancing guidelines and our immunocompromised population.      
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
## 2022-04-15 NOTE — Patient Instructions (Signed)
[facility]  Discharge Instructions: Thank you for choosing [facility] to provide your oncology and hematology care.  If you have a lab appointment with the [facility], please come in thru the Main Entrance and check in at the main information desk.  Wear comfortable clothing and clothing appropriate for easy access to any Portacath or PICC line.   We strive to give you quality time with your provider. You may need to reschedule your appointment if you arrive late (15 or more minutes).  Arriving late affects you and other patients whose appointments are after yours.  Also, if you miss three or more appointments without notifying the office, you may be dismissed from the clinic at the provider's discretion.      For prescription refill requests, have your pharmacy contact our office and allow 72 hours for refills to be completed.    Today you received the following chemotherapy and/or immunotherapy agents FOLFOX, return as scheduled.   To help prevent nausea and vomiting after your treatment, we encourage you to take your nausea medication as directed.  BELOW ARE SYMPTOMS THAT SHOULD BE REPORTED IMMEDIATELY: *FEVER GREATER THAN 100.4 F (38 C) OR HIGHER *CHILLS OR SWEATING *NAUSEA AND VOMITING THAT IS NOT CONTROLLED WITH YOUR NAUSEA MEDICATION *UNUSUAL SHORTNESS OF BREATH *UNUSUAL BRUISING OR BLEEDING *URINARY PROBLEMS (pain or burning when urinating, or frequent urination) *BOWEL PROBLEMS (unusual diarrhea, constipation, pain near the anus) TENDERNESS IN MOUTH AND THROAT WITH OR WITHOUT PRESENCE OF ULCERS (sore throat, sores in mouth, or a toothache) UNUSUAL RASH, SWELLING OR PAIN  UNUSUAL VAGINAL DISCHARGE OR ITCHING   Items with * indicate a potential emergency and should be followed up as soon as possible or go to the Emergency Department if any problems should occur.  Please show the CHEMOTHERAPY ALERT CARD or IMMUNOTHERAPY ALERT CARD at  check-in to the Emergency Department and triage nurse.  Should you have questions after your visit or need to cancel or reschedule your appointment, please contact [facility] [phone]  and follow the prompts.  Office hours are 8:00 a.m. to 4:30 p.m. Monday - Friday. Please note that voicemails left after 4:00 p.m. may not be returned until the following business day.  We are closed weekends and major holidays. You have access to a nurse at all times for urgent questions. Please call the main number to the clinic [phone] and follow the prompts.  For any non-urgent questions, you may also contact your provider using MyChart. We now offer e-Visits for anyone 22 and older to request care online for non-urgent symptoms. For details visit [email or web address].   Also download the MyChart app! Go to the app store, search "MyChart", open the app, select [facility], and log in with your MyChart username and password.  Masks are optional in the cancer centers. If you would like for your care team to wear a mask while they are taking care of you, please let them know. You may have one support person who is at least 45 years old accompany you for your appointments.

## 2022-04-15 NOTE — Progress Notes (Signed)
Patient tolerated chemotherapy with no complaints voiced. Side effects with management reviewed understanding verbalized. Port site clean and dry with no bruising or swelling noted at site. Good blood return noted before and after administration of chemotherapy. Chemo pump connected. Patient left in satisfactory condition with VSS and no s/s of distress noted.

## 2022-04-17 ENCOUNTER — Inpatient Hospital Stay: Payer: Medicaid Other

## 2022-04-17 VITALS — BP 128/81 | HR 94 | Temp 97.0°F | Resp 18

## 2022-04-17 DIAGNOSIS — C20 Malignant neoplasm of rectum: Secondary | ICD-10-CM

## 2022-04-17 DIAGNOSIS — Z5111 Encounter for antineoplastic chemotherapy: Secondary | ICD-10-CM | POA: Diagnosis not present

## 2022-04-17 MED ORDER — HEPARIN SOD (PORK) LOCK FLUSH 100 UNIT/ML IV SOLN
500.0000 [IU] | Freq: Once | INTRAVENOUS | Status: AC | PRN
  Administered 2022-04-17: 500 [IU]

## 2022-04-17 MED ORDER — SODIUM CHLORIDE 0.9% FLUSH
10.0000 mL | INTRAVENOUS | Status: DC | PRN
  Administered 2022-04-17: 10 mL

## 2022-04-17 NOTE — Progress Notes (Signed)
Patient presents today for 5FU pump stop and disconnection after 46 hour continous infusion.   5FU pump deaccessed.  Patients port flushed without difficulty.  Good blood return noted with no bruising or swelling noted at site.  Needle removed intact.  Band aid applied.  VSS with discharge and left in satisfactory condition, ambulatory with no s/s of distress noted.

## 2022-04-17 NOTE — Patient Instructions (Signed)
[facility]  Discharge Instructions: Thank you for choosing [facility] to provide your oncology and hematology care.  If you have a lab appointment with the [facility], please come in thru the Main Entrance and check in at the main information desk.  Wear comfortable clothing and clothing appropriate for easy access to any Portacath or PICC line.   We strive to give you quality time with your provider. You may need to reschedule your appointment if you arrive late (15 or more minutes).  Arriving late affects you and other patients whose appointments are after yours.  Also, if you miss three or more appointments without notifying the office, you may be dismissed from the clinic at the provider's discretion.      For prescription refill requests, have your pharmacy contact our office and allow 72 hours for refills to be completed.    Today you received the following chemotherapy and/or immunotherapy agents pump stop      To help prevent nausea and vomiting after your treatment, we encourage you to take your nausea medication as directed.  BELOW ARE SYMPTOMS THAT SHOULD BE REPORTED IMMEDIATELY: *FEVER GREATER THAN 100.4 F (38 C) OR HIGHER *CHILLS OR SWEATING *NAUSEA AND VOMITING THAT IS NOT CONTROLLED WITH YOUR NAUSEA MEDICATION *UNUSUAL SHORTNESS OF BREATH *UNUSUAL BRUISING OR BLEEDING *URINARY PROBLEMS (pain or burning when urinating, or frequent urination) *BOWEL PROBLEMS (unusual diarrhea, constipation, pain near the anus) TENDERNESS IN MOUTH AND THROAT WITH OR WITHOUT PRESENCE OF ULCERS (sore throat, sores in mouth, or a toothache) UNUSUAL RASH, SWELLING OR PAIN  UNUSUAL VAGINAL DISCHARGE OR ITCHING   Items with * indicate a potential emergency and should be followed up as soon as possible or go to the Emergency Department if any problems should occur.  Please show the CHEMOTHERAPY ALERT CARD or IMMUNOTHERAPY ALERT CARD at check-in to the  Emergency Department and triage nurse.  Should you have questions after your visit or need to cancel or reschedule your appointment, please contact [facility] [phone]  and follow the prompts.  Office hours are 8:00 a.m. to 4:30 p.m. Monday - Friday. Please note that voicemails left after 4:00 p.m. may not be returned until the following business day.  We are closed weekends and major holidays. You have access to a nurse at all times for urgent questions. Please call the main number to the clinic [phone] and follow the prompts.  For any non-urgent questions, you may also contact your provider using MyChart. We now offer e-Visits for anyone 81 and older to request care online for non-urgent symptoms. For details visit [email or web address].   Also download the MyChart app! Go to the app store, search "MyChart", open the app, select [facility], and log in with your MyChart username and password.  Masks are optional in the cancer centers. If you would like for your care team to wear a mask while they are taking care of you, please let them know. You may have one support person who is at least 45 years old accompany you for your appointments.

## 2022-04-18 DIAGNOSIS — C20 Malignant neoplasm of rectum: Secondary | ICD-10-CM | POA: Diagnosis not present

## 2022-04-20 ENCOUNTER — Other Ambulatory Visit: Payer: Self-pay

## 2022-04-20 DIAGNOSIS — C20 Malignant neoplasm of rectum: Secondary | ICD-10-CM

## 2022-04-20 MED ORDER — NYSTATIN 100000 UNIT/ML MT SUSP: 5.0000 mL | Freq: Four times a day (QID) | OROMUCOSAL | 1 refills | Status: DC

## 2022-04-29 ENCOUNTER — Ambulatory Visit: Payer: Medicaid Other | Admitting: Family Medicine

## 2022-04-29 ENCOUNTER — Inpatient Hospital Stay: Payer: Medicaid Other

## 2022-04-29 ENCOUNTER — Inpatient Hospital Stay: Payer: Medicaid Other | Admitting: Hematology

## 2022-04-29 VITALS — BP 115/66 | HR 62 | Temp 97.8°F | Resp 18 | Wt 228.0 lb

## 2022-04-29 DIAGNOSIS — C20 Malignant neoplasm of rectum: Secondary | ICD-10-CM

## 2022-04-29 DIAGNOSIS — Z5111 Encounter for antineoplastic chemotherapy: Secondary | ICD-10-CM | POA: Diagnosis not present

## 2022-04-29 LAB — CBC WITH DIFFERENTIAL/PLATELET
Abs Immature Granulocytes: 0.01 10*3/uL (ref 0.00–0.07)
Basophils Absolute: 0.1 10*3/uL (ref 0.0–0.1)
Basophils Relative: 2 %
Eosinophils Absolute: 0.2 10*3/uL (ref 0.0–0.5)
Eosinophils Relative: 4 %
HCT: 37.2 % — ABNORMAL LOW (ref 39.0–52.0)
Hemoglobin: 13.3 g/dL (ref 13.0–17.0)
Immature Granulocytes: 0 %
Lymphocytes Relative: 30 %
Lymphs Abs: 1.3 10*3/uL (ref 0.7–4.0)
MCH: 30.5 pg (ref 26.0–34.0)
MCHC: 35.8 g/dL (ref 30.0–36.0)
MCV: 85.3 fL (ref 80.0–100.0)
Monocytes Absolute: 0.8 10*3/uL (ref 0.1–1.0)
Monocytes Relative: 18 %
Neutro Abs: 2 10*3/uL (ref 1.7–7.7)
Neutrophils Relative %: 46 %
Platelets: 229 10*3/uL (ref 150–400)
RBC: 4.36 MIL/uL (ref 4.22–5.81)
RDW: 14.4 % (ref 11.5–15.5)
WBC: 4.4 10*3/uL (ref 4.0–10.5)
nRBC: 0 % (ref 0.0–0.2)

## 2022-04-29 LAB — COMPREHENSIVE METABOLIC PANEL
ALT: 40 U/L (ref 0–44)
AST: 32 U/L (ref 15–41)
Albumin: 4 g/dL (ref 3.5–5.0)
Alkaline Phosphatase: 73 U/L (ref 38–126)
Anion gap: 9 (ref 5–15)
BUN: 16 mg/dL (ref 6–20)
CO2: 23 mmol/L (ref 22–32)
Calcium: 9.3 mg/dL (ref 8.9–10.3)
Chloride: 100 mmol/L (ref 98–111)
Creatinine, Ser: 0.88 mg/dL (ref 0.61–1.24)
GFR, Estimated: >60 mL/min (ref >60)
Glucose, Bld: 189 mg/dL — ABNORMAL HIGH (ref 70–99)
Potassium: 3.4 mmol/L — ABNORMAL LOW (ref 3.5–5.1)
Sodium: 132 mmol/L — ABNORMAL LOW (ref 135–145)
Total Bilirubin: 0.8 mg/dL (ref 0.3–1.2)
Total Protein: 7.1 g/dL (ref 6.5–8.1)

## 2022-04-29 LAB — MAGNESIUM: Magnesium: 2.2 mg/dL (ref 1.7–2.4)

## 2022-04-29 MED ORDER — FLUOROURACIL CHEMO INJECTION 2.5 GM/50ML
400.0000 mg/m2 | Freq: Once | INTRAVENOUS | Status: AC
  Administered 2022-04-29: 900 mg via INTRAVENOUS
  Filled 2022-04-29: qty 18

## 2022-04-29 MED ORDER — SODIUM CHLORIDE 0.9 % IV SOLN
5000.0000 mg | INTRAVENOUS | Status: DC
  Administered 2022-04-29: 5000 mg via INTRAVENOUS
  Filled 2022-04-29: qty 100

## 2022-04-29 MED ORDER — SODIUM CHLORIDE 0.9 % IV SOLN
10.0000 mg | Freq: Once | INTRAVENOUS | Status: AC
  Administered 2022-04-29: 10 mg via INTRAVENOUS
  Filled 2022-04-29: qty 10

## 2022-04-29 MED ORDER — DEXTROSE 5 % IV SOLN: Freq: Once | INTRAVENOUS | Status: AC

## 2022-04-29 MED ORDER — PALONOSETRON HCL INJECTION 0.25 MG/5ML
0.2500 mg | Freq: Once | INTRAVENOUS | Status: AC
  Administered 2022-04-29: 0.25 mg via INTRAVENOUS
  Filled 2022-04-29: qty 5

## 2022-04-29 MED ORDER — SODIUM CHLORIDE 0.9% FLUSH
10.0000 mL | Freq: Once | INTRAVENOUS | Status: AC
  Administered 2022-04-29: 10 mL via INTRAVENOUS

## 2022-04-29 MED ORDER — LEUCOVORIN CALCIUM INJECTION 350 MG
400.0000 mg/m2 | Freq: Once | INTRAVENOUS | Status: AC
  Administered 2022-04-29: 924 mg via INTRAVENOUS
  Filled 2022-04-29: qty 46.2

## 2022-04-29 MED ORDER — OXALIPLATIN CHEMO INJECTION 100 MG/20ML
85.0000 mg/m2 | Freq: Once | INTRAVENOUS | Status: AC
  Administered 2022-04-29: 195 mg via INTRAVENOUS
  Filled 2022-04-29: qty 39

## 2022-04-29 NOTE — Progress Notes (Signed)
Patient presents today for Folfox treatment. Vital signs within parameters for treatment. Labs pending. Patient has complaints of a rash, bilateral on both hips that is new since his last treatment. Patient states , " It just came up and it itches. " Upon assessment , rash noted bilateral on hips, raised, discolored. No drainage or pain noted. Reported new rash to Dr. Delton Coombes through secure chat.   Message received from A. Ouida Sills RN / Dr. Delton Coombes to proceed with treatment.   Treatment given today per MD orders. Tolerated infusion without adverse affects. 5FU pump infusing and RUN noted on the screen. Vital signs stable. No complaints at this time. Discharged from clinic ambulatory in stable condition. Alert and oriented x 3. F/U with [facility] as scheduled.

## 2022-04-29 NOTE — Patient Instructions (Signed)
[facility]  Discharge Instructions: Thank you for choosing [facility] to provide your oncology and hematology care.  If you have a lab appointment with the [facility], please come in thru the Main Entrance and check in at the main information desk.  Wear comfortable clothing and clothing appropriate for easy access to any Portacath or PICC line.   We strive to give you quality time with your provider. You may need to reschedule your appointment if you arrive late (15 or more minutes).  Arriving late affects you and other patients whose appointments are after yours.  Also, if you miss three or more appointments without notifying the office, you may be dismissed from the clinic at the provider's discretion.      For prescription refill requests, have your pharmacy contact our office and allow 72 hours for refills to be completed.    Today you received the following chemotherapy and/or immunotherapy agents Folfox, 5FU. Fluorouracil Injection What is this medication? FLUOROURACIL (flure oh YOOR a sil) treats some types of cancer. It works by slowing down the growth of cancer cells. This medicine may be used for other purposes; ask your health care provider or pharmacist if you have questions. COMMON BRAND NAME(S): Adrucil What should I tell my care team before I take this medication? They need to know if you have any of these conditions: Blood disorders Dihydropyrimidine dehydrogenase (DPD) deficiency Infection, such as chickenpox, cold sores, herpes Kidney disease Liver disease Poor nutrition Recent or ongoing radiation therapy An unusual or allergic reaction to fluorouracil, other medications, foods, dyes, or preservatives If you or your partner are pregnant or trying to get pregnant Breast-feeding How should I use this medication? This medication is injected into a vein. It is administered by your care team in a hospital or clinic setting. Talk to  your care team about the use of this medication in children. Special care may be needed. Overdosage: If you think you have taken too much of this medicine contact a poison control center or emergency room at once. NOTE: This medicine is only for you. Do not share this medicine with others. What if I miss a dose? Keep appointments for follow-up doses. It is important not to miss your dose. Call your care team if you are unable to keep an appointment. What may interact with this medication? Do not take this medication with any of the following: Live virus vaccines This medication may also interact with the following: Medications that treat or prevent blood clots, such as warfarin, enoxaparin, dalteparin This list may not describe all possible interactions. Give your health care provider a list of all the medicines, herbs, non-prescription drugs, or dietary supplements you use. Also tell them if you smoke, drink alcohol, or use illegal drugs. Some items may interact with your medicine. What should I watch for while using this medication? Your condition will be monitored carefully while you are receiving this medication. This medication may make you feel generally unwell. This is not uncommon as chemotherapy can affect healthy cells as well as cancer cells. Report any side effects. Continue your course of treatment even though you feel ill unless your care team tells you to stop. In some cases, you may be given additional medications to help with side effects. Follow all directions for their use. This medication may increase your risk of getting an infection. Call your care team for advice if you get a fever, chills, sore throat, or other symptoms  of a cold or flu. Do not treat yourself. Try to avoid being around people who are sick. This medication may increase your risk to bruise or bleed. Call your care team if you notice any unusual bleeding. Be careful brushing or flossing your teeth or using a  toothpick because you may get an infection or bleed more easily. If you have any dental work done, tell your dentist you are receiving this medication. Avoid taking medications that contain aspirin, acetaminophen, ibuprofen, naproxen, or ketoprofen unless instructed by your care team. These medications may hide a fever. Do not treat diarrhea with over the counter products. Contact your care team if you have diarrhea that lasts more than 2 days or if it is severe and watery. This medication can make you more sensitive to the sun. Keep out of the sun. If you cannot avoid being in the sun, wear protective clothing and sunscreen. Do not use sun lamps, tanning beds, or tanning booths. Talk to your care team if you or your partner wish to become pregnant or think you might be pregnant. This medication can cause serious birth defects if taken during pregnancy and for 3 months after the last dose. A reliable form of contraception is recommended while taking this medication and for 3 months after the last dose. Talk to your care team about effective forms of contraception. Do not father a child while taking this medication and for 3 months after the last dose. Use a condom while having sex during this time period. Do not breastfeed while taking this medication. This medication may cause infertility. Talk to your care team if you are concerned about your fertility. What side effects may I notice from receiving this medication? Side effects that you should report to your care team as soon as possible: Allergic reactions--skin rash, itching, hives, swelling of the face, lips, tongue, or throat Heart attack--pain or tightness in the chest, shoulders, arms, or jaw, nausea, shortness of breath, cold or clammy skin, feeling faint or lightheaded Heart failure--shortness of breath, swelling of the ankles, feet, or hands, sudden weight gain, unusual weakness or fatigue Heart rhythm changes--fast or irregular heartbeat,  dizziness, feeling faint or lightheaded, chest pain, trouble breathing High ammonia level--unusual weakness or fatigue, confusion, loss of appetite, nausea, vomiting, seizures Infection--fever, chills, cough, sore throat, wounds that don't heal, pain or trouble when passing urine, general feeling of discomfort or being unwell Low red blood cell level--unusual weakness or fatigue, dizziness, headache, trouble breathing Pain, tingling, or numbness in the hands or feet, muscle weakness, change in vision, confusion or trouble speaking, loss of balance or coordination, trouble walking, seizures Redness, swelling, and blistering of the skin over hands and feet Severe or prolonged diarrhea Unusual bruising or bleeding Side effects that usually do not require medical attention (report to your care team if they continue or are bothersome): Dry skin Headache Increased tears Nausea Pain, redness, or swelling with sores inside the mouth or throat Sensitivity to light Vomiting This list may not describe all possible side effects. Call your doctor for medical advice about side effects. You may report side effects to FDA at 1-800-FDA-1088. Where should I keep my medication? This medication is given in a hospital or clinic. It will not be stored at home. NOTE: This sheet is a summary. It may not cover all possible information. If you have questions about this medicine, talk to your doctor, pharmacist, or health care provider.  2023 Elsevier/Gold Standard (2021-08-26 00:00:00) Leucovorin Injection What is  this medication? LEUCOVORIN (loo koe VOR in) prevents side effects from certain medications, such as methotrexate. It works by increasing folate levels. This helps protect healthy cells in your body. It may also be used to treat anemia caused by low levels of folate. It can also be used with fluorouracil, a type of chemotherapy, to treat colorectal cancer. It works by increasing the effects of fluorouracil  in the body. This medicine may be used for other purposes; ask your health care provider or pharmacist if you have questions. What should I tell my care team before I take this medication? They need to know if you have any of these conditions: Anemia from low levels of vitamin B12 in the blood An unusual or allergic reaction to leucovorin, folic acid, other medications, foods, dyes, or preservatives Pregnant or trying to get pregnant Breastfeeding How should I use this medication? This medication is injected into a vein or a muscle. It is given by your care team in a hospital or clinic setting. Talk to your care team about the use of this medication in children. Special care may be needed. Overdosage: If you think you have taken too much of this medicine contact a poison control center or emergency room at once. NOTE: This medicine is only for you. Do not share this medicine with others. What if I miss a dose? Keep appointments for follow-up doses. It is important not to miss your dose. Call your care team if you are unable to keep an appointment. What may interact with this medication? Capecitabine Fluorouracil Phenobarbital Phenytoin Primidone Trimethoprim;sulfamethoxazole This list may not describe all possible interactions. Give your health care provider a list of all the medicines, herbs, non-prescription drugs, or dietary supplements you use. Also tell them if you smoke, drink alcohol, or use illegal drugs. Some items may interact with your medicine. What should I watch for while using this medication? Your condition will be monitored carefully while you are receiving this medication. This medication may increase the side effects of 5-fluorouracil. Tell your care team if you have diarrhea or mouth sores that do not get better or that get worse. What side effects may I notice from receiving this medication? Side effects that you should report to your care team as soon as  possible: Allergic reactions--skin rash, itching, hives, swelling of the face, lips, tongue, or throat This list may not describe all possible side effects. Call your doctor for medical advice about side effects. You may report side effects to FDA at 1-800-FDA-1088. Where should I keep my medication? This medication is given in a hospital or clinic. It will not be stored at home. NOTE: This sheet is a summary. It may not cover all possible information. If you have questions about this medicine, talk to your doctor, pharmacist, or health care provider.  2023 Elsevier/Gold Standard (2021-09-30 00:00:00) Oxaliplatin Injection What is this medication? OXALIPLATIN (ox AL i PLA tin) treats some types of cancer. It works by slowing down the growth of cancer cells. This medicine may be used for other purposes; ask your health care provider or pharmacist if you have questions. COMMON BRAND NAME(S): Eloxatin What should I tell my care team before I take this medication? They need to know if you have any of these conditions: Heart disease History of irregular heartbeat or rhythm Liver disease Low blood cell levels (white cells, red cells, and platelets) Lung or breathing disease, such as asthma Take medications that treat or prevent blood clots Tingling of the  fingers, toes, or other nerve disorder An unusual or allergic reaction to oxaliplatin, other medications, foods, dyes, or preservatives If you or your partner are pregnant or trying to get pregnant Breast-feeding How should I use this medication? This medication is injected into a vein. It is given by your care team in a hospital or clinic setting. Talk to your care team about the use of this medication in children. Special care may be needed. Overdosage: If you think you have taken too much of this medicine contact a poison control center or emergency room at once. NOTE: This medicine is only for you. Do not share this medicine with  others. What if I miss a dose? Keep appointments for follow-up doses. It is important not to miss a dose. Call your care team if you are unable to keep an appointment. What may interact with this medication? Do not take this medication with any of the following: Cisapride Dronedarone Pimozide Thioridazine This medication may also interact with the following: Aspirin and aspirin-like medications Certain medications that treat or prevent blood clots, such as warfarin, apixaban, dabigatran, and rivaroxaban Cisplatin Cyclosporine Diuretics Medications for infection, such as acyclovir, adefovir, amphotericin B, bacitracin, cidofovir, foscarnet, ganciclovir, gentamicin, pentamidine, vancomycin NSAIDs, medications for pain and inflammation, such as ibuprofen or naproxen Other medications that cause heart rhythm changes Pamidronate Zoledronic acid This list may not describe all possible interactions. Give your health care provider a list of all the medicines, herbs, non-prescription drugs, or dietary supplements you use. Also tell them if you smoke, drink alcohol, or use illegal drugs. Some items may interact with your medicine. What should I watch for while using this medication? Your condition will be monitored carefully while you are receiving this medication. You may need blood work while taking this medication. This medication may make you feel generally unwell. This is not uncommon as chemotherapy can affect healthy cells as well as cancer cells. Report any side effects. Continue your course of treatment even though you feel ill unless your care team tells you to stop. This medication may increase your risk of getting an infection. Call your care team for advice if you get a fever, chills, sore throat, or other symptoms of a cold or flu. Do not treat yourself. Try to avoid being around people who are sick. Avoid taking medications that contain aspirin, acetaminophen, ibuprofen, naproxen, or  ketoprofen unless instructed by your care team. These medications may hide a fever. Be careful brushing or flossing your teeth or using a toothpick because you may get an infection or bleed more easily. If you have any dental work done, tell your dentist you are receiving this medication. This medication can make you more sensitive to cold. Do not drink cold drinks or use ice. Cover exposed skin before coming in contact with cold temperatures or cold objects. When out in cold weather wear warm clothing and cover your mouth and nose to warm the air that goes into your lungs. Tell your care team if you get sensitive to the cold. Talk to your care team if you or your partner are pregnant or think either of you might be pregnant. This medication can cause serious birth defects if taken during pregnancy and for 9 months after the last dose. A negative pregnancy test is required before starting this medication. A reliable form of contraception is recommended while taking this medication and for 9 months after the last dose. Talk to your care team about effective forms of contraception. Do  not father a child while taking this medication and for 6 months after the last dose. Use a condom while having sex during this time period. Do not breastfeed while taking this medication and for 3 months after the last dose. This medication may cause infertility. Talk to your care team if you are concerned about your fertility. What side effects may I notice from receiving this medication? Side effects that you should report to your care team as soon as possible: Allergic reactions--skin rash, itching, hives, swelling of the face, lips, tongue, or throat Bleeding--bloody or black, tar-like stools, vomiting blood or brown material that looks like coffee grounds, red or dark brown urine, small red or purple spots on skin, unusual bruising or bleeding Dry cough, shortness of breath or trouble breathing Heart rhythm changes--fast  or irregular heartbeat, dizziness, feeling faint or lightheaded, chest pain, trouble breathing Infection--fever, chills, cough, sore throat, wounds that don't heal, pain or trouble when passing urine, general feeling of discomfort or being unwell Liver injury--right upper belly pain, loss of appetite, nausea, light-colored stool, dark yellow or brown urine, yellowing skin or eyes, unusual weakness or fatigue Low red blood cell level--unusual weakness or fatigue, dizziness, headache, trouble breathing Muscle injury--unusual weakness or fatigue, muscle pain, dark yellow or brown urine, decrease in amount of urine Pain, tingling, or numbness in the hands or feet Sudden and severe headache, confusion, change in vision, seizures, which may be signs of posterior reversible encephalopathy syndrome (PRES) Unusual bruising or bleeding Side effects that usually do not require medical attention (report to your care team if they continue or are bothersome): Diarrhea Nausea Pain, redness, or swelling with sores inside the mouth or throat Unusual weakness or fatigue Vomiting This list may not describe all possible side effects. Call your doctor for medical advice about side effects. You may report side effects to FDA at 1-800-FDA-1088. Where should I keep my medication? This medication is given in a hospital or clinic. It will not be stored at home. NOTE: This sheet is a summary. It may not cover all possible information. If you have questions about this medicine, talk to your doctor, pharmacist, or health care provider.  2023 Elsevier/Gold Standard (2007-06-18 00:00:00)       To help prevent nausea and vomiting after your treatment, we encourage you to take your nausea medication as directed.  BELOW ARE SYMPTOMS THAT SHOULD BE REPORTED IMMEDIATELY: *FEVER GREATER THAN 100.4 F (38 C) OR HIGHER *CHILLS OR SWEATING *NAUSEA AND VOMITING THAT IS NOT CONTROLLED WITH YOUR NAUSEA MEDICATION *UNUSUAL  SHORTNESS OF BREATH *UNUSUAL BRUISING OR BLEEDING *URINARY PROBLEMS (pain or burning when urinating, or frequent urination) *BOWEL PROBLEMS (unusual diarrhea, constipation, pain near the anus) TENDERNESS IN MOUTH AND THROAT WITH OR WITHOUT PRESENCE OF ULCERS (sore throat, sores in mouth, or a toothache) UNUSUAL RASH, SWELLING OR PAIN  UNUSUAL VAGINAL DISCHARGE OR ITCHING   Items with * indicate a potential emergency and should be followed up as soon as possible or go to the Emergency Department if any problems should occur.  Please show the CHEMOTHERAPY ALERT CARD or IMMUNOTHERAPY ALERT CARD at check-in to the Emergency Department and triage nurse.  Should you have questions after your visit or need to cancel or reschedule your appointment, please contact [facility] [phone]  and follow the prompts.  Office hours are 8:00 a.m. to 4:30 p.m. Monday - Friday. Please note that voicemails left after 4:00 p.m. may not be returned until the following business  day.  We are closed weekends and major holidays. You have access to a nurse at all times for urgent questions. Please call the main number to the clinic [phone] and follow the prompts.  For any non-urgent questions, you may also contact your provider using MyChart. We now offer e-Visits for anyone 40 and older to request care online for non-urgent symptoms. For details visit [email or web address].   Also download the MyChart app! Go to the app store, search "MyChart", open the app, select [facility], and log in with your MyChart username and password.  Masks are optional in the cancer centers. If you would like for your care team to wear a mask while they are taking care of you, please let them know. You may have one support person who is at least 45 years old accompany you for your appointments.

## 2022-04-29 NOTE — Progress Notes (Signed)
Patients port flushed without difficulty.  Good blood return noted with no bruising or swelling noted at site.  Stable during access and blood draw.  Patient to remain accessed for treatment. 

## 2022-05-01 ENCOUNTER — Inpatient Hospital Stay: Payer: Medicaid Other

## 2022-05-01 VITALS — BP 141/103 | HR 99 | Temp 98.6°F | Resp 20

## 2022-05-01 DIAGNOSIS — Z5111 Encounter for antineoplastic chemotherapy: Secondary | ICD-10-CM | POA: Diagnosis not present

## 2022-05-01 DIAGNOSIS — C20 Malignant neoplasm of rectum: Secondary | ICD-10-CM

## 2022-05-01 MED ORDER — SODIUM CHLORIDE 0.9% FLUSH
10.0000 mL | INTRAVENOUS | Status: DC | PRN
  Administered 2022-05-01: 10 mL

## 2022-05-01 MED ORDER — HEPARIN SOD (PORK) LOCK FLUSH 100 UNIT/ML IV SOLN
500.0000 [IU] | Freq: Once | INTRAVENOUS | Status: AC | PRN
  Administered 2022-05-01: 500 [IU]

## 2022-05-01 NOTE — Patient Instructions (Signed)
[facility]  Discharge Instructions: Thank you for choosing [facility] to provide your oncology and hematology care.  If you have a lab appointment with the [facility], please come in thru the Main Entrance and check in at the main information desk.  Wear comfortable clothing and clothing appropriate for easy access to any Portacath or PICC line.   We strive to give you quality time with your provider. You may need to reschedule your appointment if you arrive late (15 or more minutes).  Arriving late affects you and other patients whose appointments are after yours.  Also, if you miss three or more appointments without notifying the office, you may be dismissed from the clinic at the provider's discretion.      For prescription refill requests, have your pharmacy contact our office and allow 72 hours for refills to be completed.    Today you received the following chemotherapy and/or immunotherapy agents pump d/c Adrucil. Fluorouracil Injection .pump What is this medication? FLUOROURACIL (flure oh YOOR a sil) treats some types of cancer. It works by slowing down the growth of cancer cells. This medicine may be used for other purposes; ask your health care provider or pharmacist if you have questions. COMMON BRAND NAME(S): Adrucil What should I tell my care team before I take this medication? They need to know if you have any of these conditions: Blood disorders Dihydropyrimidine dehydrogenase (DPD) deficiency Infection, such as chickenpox, cold sores, herpes Kidney disease Liver disease Poor nutrition Recent or ongoing radiation therapy An unusual or allergic reaction to fluorouracil, other medications, foods, dyes, or preservatives If you or your partner are pregnant or trying to get pregnant Breast-feeding How should I use this medication? This medication is injected into a vein. It is administered by your care team in a hospital or clinic  setting. Talk to your care team about the use of this medication in children. Special care may be needed. Overdosage: If you think you have taken too much of this medicine contact a poison control center or emergency room at once. NOTE: This medicine is only for you. Do not share this medicine with others. What if I miss a dose? Keep appointments for follow-up doses. It is important not to miss your dose. Call your care team if you are unable to keep an appointment. What may interact with this medication? Do not take this medication with any of the following: Live virus vaccines This medication may also interact with the following: Medications that treat or prevent blood clots, such as warfarin, enoxaparin, dalteparin This list may not describe all possible interactions. Give your health care provider a list of all the medicines, herbs, non-prescription drugs, or dietary supplements you use. Also tell them if you smoke, drink alcohol, or use illegal drugs. Some items may interact with your medicine. What should I watch for while using this medication? Your condition will be monitored carefully while you are receiving this medication. This medication may make you feel generally unwell. This is not uncommon as chemotherapy can affect healthy cells as well as cancer cells. Report any side effects. Continue your course of treatment even though you feel ill unless your care team tells you to stop. In some cases, you may be given additional medications to help with side effects. Follow all directions for their use. This medication may increase your risk of getting an infection. Call your care team for advice if you get a fever, chills, sore throat, or  other symptoms of a cold or flu. Do not treat yourself. Try to avoid being around people who are sick. This medication may increase your risk to bruise or bleed. Call your care team if you notice any unusual bleeding. Be careful brushing or flossing your  teeth or using a toothpick because you may get an infection or bleed more easily. If you have any dental work done, tell your dentist you are receiving this medication. Avoid taking medications that contain aspirin, acetaminophen, ibuprofen, naproxen, or ketoprofen unless instructed by your care team. These medications may hide a fever. Do not treat diarrhea with over the counter products. Contact your care team if you have diarrhea that lasts more than 2 days or if it is severe and watery. This medication can make you more sensitive to the sun. Keep out of the sun. If you cannot avoid being in the sun, wear protective clothing and sunscreen. Do not use sun lamps, tanning beds, or tanning booths. Talk to your care team if you or your partner wish to become pregnant or think you might be pregnant. This medication can cause serious birth defects if taken during pregnancy and for 3 months after the last dose. A reliable form of contraception is recommended while taking this medication and for 3 months after the last dose. Talk to your care team about effective forms of contraception. Do not father a child while taking this medication and for 3 months after the last dose. Use a condom while having sex during this time period. Do not breastfeed while taking this medication. This medication may cause infertility. Talk to your care team if you are concerned about your fertility. What side effects may I notice from receiving this medication? Side effects that you should report to your care team as soon as possible: Allergic reactions--skin rash, itching, hives, swelling of the face, lips, tongue, or throat Heart attack--pain or tightness in the chest, shoulders, arms, or jaw, nausea, shortness of breath, cold or clammy skin, feeling faint or lightheaded Heart failure--shortness of breath, swelling of the ankles, feet, or hands, sudden weight gain, unusual weakness or fatigue Heart rhythm changes--fast or  irregular heartbeat, dizziness, feeling faint or lightheaded, chest pain, trouble breathing High ammonia level--unusual weakness or fatigue, confusion, loss of appetite, nausea, vomiting, seizures Infection--fever, chills, cough, sore throat, wounds that don't heal, pain or trouble when passing urine, general feeling of discomfort or being unwell Low red blood cell level--unusual weakness or fatigue, dizziness, headache, trouble breathing Pain, tingling, or numbness in the hands or feet, muscle weakness, change in vision, confusion or trouble speaking, loss of balance or coordination, trouble walking, seizures Redness, swelling, and blistering of the skin over hands and feet Severe or prolonged diarrhea Unusual bruising or bleeding Side effects that usually do not require medical attention (report to your care team if they continue or are bothersome): Dry skin Headache Increased tears Nausea Pain, redness, or swelling with sores inside the mouth or throat Sensitivity to light Vomiting This list may not describe all possible side effects. Call your doctor for medical advice about side effects. You may report side effects to FDA at 1-800-FDA-1088. Where should I keep my medication? This medication is given in a hospital or clinic. It will not be stored at home. NOTE: This sheet is a summary. It may not cover all possible information. If you have questions about this medicine, talk to your doctor, pharmacist, or health care provider.  2023 Elsevier/Gold Standard (2021-08-26 00:00:00)  To help prevent nausea and vomiting after your treatment, we encourage you to take your nausea medication as directed.  BELOW ARE SYMPTOMS THAT SHOULD BE REPORTED IMMEDIATELY: *FEVER GREATER THAN 100.4 F (38 C) OR HIGHER *CHILLS OR SWEATING *NAUSEA AND VOMITING THAT IS NOT CONTROLLED WITH YOUR NAUSEA MEDICATION *UNUSUAL SHORTNESS OF BREATH *UNUSUAL BRUISING OR BLEEDING *URINARY PROBLEMS (pain or  burning when urinating, or frequent urination) *BOWEL PROBLEMS (unusual diarrhea, constipation, pain near the anus) TENDERNESS IN MOUTH AND THROAT WITH OR WITHOUT PRESENCE OF ULCERS (sore throat, sores in mouth, or a toothache) UNUSUAL RASH, SWELLING OR PAIN  UNUSUAL VAGINAL DISCHARGE OR ITCHING   Items with * indicate a potential emergency and should be followed up as soon as possible or go to the Emergency Department if any problems should occur.  Please show the CHEMOTHERAPY ALERT CARD or IMMUNOTHERAPY ALERT CARD at check-in to the Emergency Department and triage nurse.  Should you have questions after your visit or need to cancel or reschedule your appointment, please contact [facility] [phone]  and follow the prompts.  Office hours are 8:00 a.m. to 4:30 p.m. Monday - Friday. Please note that voicemails left after 4:00 p.m. may not be returned until the following business day.  We are closed weekends and major holidays. You have access to a nurse at all times for urgent questions. Please call the main number to the clinic [phone] and follow the prompts.  For any non-urgent questions, you may also contact your provider using MyChart. We now offer e-Visits for anyone 29 and older to request care online for non-urgent symptoms. For details visit [email or web address].   Also download the MyChart app! Go to the app store, search "MyChart", open the app, select [facility], and log in with your MyChart username and password.  Masks are optional in the cancer centers. If you would like for your care team to wear a mask while they are taking care of you, please let them know. You may have one support person who is at least 45 years old accompany you for your appointments.

## 2022-05-01 NOTE — Progress Notes (Signed)
Patient presents today for pump d/c. Vital signs are stable. Port a cath site clean, dry, and intact. Port flushed with 10 mls of Normal Saline and 500 Units of Heparin. Needle removed intact. Band aid applied. Patient has no complaints at this time. Discharged from clinic ambulatory and in stable condition. Patient alert and oriented.  

## 2022-05-05 ENCOUNTER — Other Ambulatory Visit: Payer: Self-pay

## 2022-05-05 DIAGNOSIS — C20 Malignant neoplasm of rectum: Secondary | ICD-10-CM

## 2022-05-06 ENCOUNTER — Inpatient Hospital Stay: Payer: Medicaid Other

## 2022-05-06 ENCOUNTER — Inpatient Hospital Stay (HOSPITAL_BASED_OUTPATIENT_CLINIC_OR_DEPARTMENT_OTHER): Payer: Medicaid Other | Admitting: Hematology

## 2022-05-06 VITALS — BP 112/75 | HR 87 | Temp 98.2°F | Resp 18

## 2022-05-06 DIAGNOSIS — C20 Malignant neoplasm of rectum: Secondary | ICD-10-CM

## 2022-05-06 DIAGNOSIS — Z5111 Encounter for antineoplastic chemotherapy: Secondary | ICD-10-CM | POA: Diagnosis not present

## 2022-05-06 DIAGNOSIS — E876 Hypokalemia: Secondary | ICD-10-CM | POA: Insufficient documentation

## 2022-05-06 LAB — MAGNESIUM: Magnesium: 1.9 mg/dL (ref 1.7–2.4)

## 2022-05-06 LAB — CBC WITH DIFFERENTIAL/PLATELET
Abs Immature Granulocytes: 0.01 10*3/uL (ref 0.00–0.07)
Basophils Absolute: 0 10*3/uL (ref 0.0–0.1)
Basophils Relative: 1 %
Eosinophils Absolute: 0 10*3/uL (ref 0.0–0.5)
Eosinophils Relative: 1 %
HCT: 40.9 % (ref 39.0–52.0)
Hemoglobin: 15.1 g/dL (ref 13.0–17.0)
Immature Granulocytes: 0 %
Lymphocytes Relative: 38 %
Lymphs Abs: 1.5 10*3/uL (ref 0.7–4.0)
MCH: 29.7 pg (ref 26.0–34.0)
MCHC: 36.9 g/dL — ABNORMAL HIGH (ref 30.0–36.0)
MCV: 80.4 fL (ref 80.0–100.0)
Monocytes Absolute: 0.5 10*3/uL (ref 0.1–1.0)
Monocytes Relative: 11 %
Neutro Abs: 2 10*3/uL (ref 1.7–7.7)
Neutrophils Relative %: 49 %
Platelets: 256 10*3/uL (ref 150–400)
RBC: 5.09 MIL/uL (ref 4.22–5.81)
RDW: 13.5 % (ref 11.5–15.5)
WBC: 4 10*3/uL (ref 4.0–10.5)
nRBC: 0 % (ref 0.0–0.2)

## 2022-05-06 LAB — COMPREHENSIVE METABOLIC PANEL
ALT: 52 U/L — ABNORMAL HIGH (ref 0–44)
AST: 37 U/L (ref 15–41)
Albumin: 4.4 g/dL (ref 3.5–5.0)
Alkaline Phosphatase: 72 U/L (ref 38–126)
Anion gap: 11 (ref 5–15)
BUN: 14 mg/dL (ref 6–20)
CO2: 27 mmol/L (ref 22–32)
Calcium: 9.2 mg/dL (ref 8.9–10.3)
Chloride: 88 mmol/L — ABNORMAL LOW (ref 98–111)
Creatinine, Ser: 0.91 mg/dL (ref 0.61–1.24)
GFR, Estimated: >60 mL/min (ref >60)
Glucose, Bld: 130 mg/dL — ABNORMAL HIGH (ref 70–99)
Potassium: 2.6 mmol/L — CL (ref 3.5–5.1)
Sodium: 126 mmol/L — ABNORMAL LOW (ref 135–145)
Total Bilirubin: 2 mg/dL — ABNORMAL HIGH (ref 0.3–1.2)
Total Protein: 7.4 g/dL (ref 6.5–8.1)

## 2022-05-06 MED ORDER — SODIUM CHLORIDE 0.9% FLUSH
10.0000 mL | INTRAVENOUS | Status: AC
  Administered 2022-05-06: 10 mL

## 2022-05-06 MED ORDER — POTASSIUM CHLORIDE IN NACL 20-0.9 MEQ/L-% IV SOLN
Freq: Once | INTRAVENOUS | Status: AC
  Filled 2022-05-06: qty 1000

## 2022-05-06 MED ORDER — HEPARIN SOD (PORK) LOCK FLUSH 100 UNIT/ML IV SOLN
500.0000 [IU] | Freq: Once | INTRAVENOUS | Status: AC
  Administered 2022-05-06: 500 [IU] via INTRAVENOUS

## 2022-05-06 MED ORDER — POTASSIUM CHLORIDE CRYS ER 20 MEQ PO TBCR
40.0000 meq | EXTENDED_RELEASE_TABLET | ORAL | Status: AC
  Administered 2022-05-06 (×2): 40 meq via ORAL
  Filled 2022-05-06: qty 2

## 2022-05-06 NOTE — Progress Notes (Signed)
Patient's potassium 2.6 . Patient presents today for 40 mEq po potassium, 20 mEq IV potassium and 40 mEq po potassium standing orders. Patient called and spoke with front staff and reported symptoms of diarrhea, nausea, fatigue and dizziness. Vital signs stable and within normal limits. Patient eating and drinking soup at the bedside.   Hydration fluids given today per MD orders. Tolerated infusion without adverse affects. Vital signs stable. No complaints at this time. Discharged from clinic ambulatory in stable condition. Alert and oriented x 3. F/U with [facility] as scheduled.

## 2022-05-06 NOTE — Patient Instructions (Signed)
[facility]  Discharge Instructions: Thank you for choosing [facility] to provide your oncology and hematology care.  If you have a lab appointment with the [facility], please come in thru the Main Entrance and check in at the main information desk.  Wear comfortable clothing and clothing appropriate for easy access to any Portacath or PICC line.   We strive to give you quality time with your provider. You may need to reschedule your appointment if you arrive late (15 or more minutes).  Arriving late affects you and other patients whose appointments are after yours.  Also, if you miss three or more appointments without notifying the office, you may be dismissed from the clinic at the provider's discretion.      For prescription refill requests, have your pharmacy contact our office and allow 72 hours for refills to be completed.    Today you received the following chemotherapy and/or immunotherapy agents hydration fluids .       To help prevent nausea and vomiting after your treatment, we encourage you to take your nausea medication as directed.  BELOW ARE SYMPTOMS THAT SHOULD BE REPORTED IMMEDIATELY: *FEVER GREATER THAN 100.4 F (38 C) OR HIGHER *CHILLS OR SWEATING *NAUSEA AND VOMITING THAT IS NOT CONTROLLED WITH YOUR NAUSEA MEDICATION *UNUSUAL SHORTNESS OF BREATH *UNUSUAL BRUISING OR BLEEDING *URINARY PROBLEMS (pain or burning when urinating, or frequent urination) *BOWEL PROBLEMS (unusual diarrhea, constipation, pain near the anus) TENDERNESS IN MOUTH AND THROAT WITH OR WITHOUT PRESENCE OF ULCERS (sore throat, sores in mouth, or a toothache) UNUSUAL RASH, SWELLING OR PAIN  UNUSUAL VAGINAL DISCHARGE OR ITCHING   Items with * indicate a potential emergency and should be followed up as soon as possible or go to the Emergency Department if any problems should occur.  Please show the CHEMOTHERAPY ALERT CARD or IMMUNOTHERAPY ALERT CARD at check-in to  the Emergency Department and triage nurse.  Should you have questions after your visit or need to cancel or reschedule your appointment, please contact [facility] [phone]  and follow the prompts.  Office hours are 8:00 a.m. to 4:30 p.m. Monday - Friday. Please note that voicemails left after 4:00 p.m. may not be returned until the following business day.  We are closed weekends and major holidays. You have access to a nurse at all times for urgent questions. Please call the main number to the clinic [phone] and follow the prompts.  For any non-urgent questions, you may also contact your provider using MyChart. We now offer e-Visits for anyone 63 and older to request care online for non-urgent symptoms. For details visit [email or web address].   Also download the MyChart app! Go to the app store, search "MyChart", open the app, select [facility], and log in with your MyChart username and password.

## 2022-05-06 NOTE — Progress Notes (Signed)
CRITICAL VALUE ALERT Critical value received:  Potassium 2.6. Date of notification:  05-06-2022 Time of notification: 10:55 am.  Critical value read back:  Yes.   Nurse who received alert:  B. Tkai Large RN.  MD notified time and response:  Dr. Delton Coombes / A. Beckie Salts. Orders received to give 40 mEq PO potassium, 20 mEq IV potassium and post 40 mEq potassium today.

## 2022-05-13 ENCOUNTER — Other Ambulatory Visit: Payer: Medicaid Other

## 2022-05-13 ENCOUNTER — Other Ambulatory Visit: Payer: Self-pay

## 2022-05-13 ENCOUNTER — Ambulatory Visit: Payer: Medicaid Other | Admitting: Hematology

## 2022-05-13 ENCOUNTER — Ambulatory Visit: Payer: Medicaid Other

## 2022-05-18 ENCOUNTER — Encounter: Payer: Self-pay | Admitting: Hematology

## 2022-05-18 ENCOUNTER — Inpatient Hospital Stay: Payer: Medicaid Other | Attending: Hematology

## 2022-05-18 ENCOUNTER — Inpatient Hospital Stay: Payer: Medicaid Other

## 2022-05-18 ENCOUNTER — Inpatient Hospital Stay (HOSPITAL_BASED_OUTPATIENT_CLINIC_OR_DEPARTMENT_OTHER): Payer: Medicaid Other | Admitting: Hematology

## 2022-05-18 DIAGNOSIS — B370 Candidal stomatitis: Secondary | ICD-10-CM | POA: Insufficient documentation

## 2022-05-18 DIAGNOSIS — Z5111 Encounter for antineoplastic chemotherapy: Secondary | ICD-10-CM | POA: Insufficient documentation

## 2022-05-18 DIAGNOSIS — Z95828 Presence of other vascular implants and grafts: Secondary | ICD-10-CM

## 2022-05-18 DIAGNOSIS — R7401 Elevation of levels of liver transaminase levels: Secondary | ICD-10-CM | POA: Insufficient documentation

## 2022-05-18 DIAGNOSIS — E876 Hypokalemia: Secondary | ICD-10-CM | POA: Insufficient documentation

## 2022-05-18 DIAGNOSIS — C20 Malignant neoplasm of rectum: Secondary | ICD-10-CM

## 2022-05-18 DIAGNOSIS — E1142 Type 2 diabetes mellitus with diabetic polyneuropathy: Secondary | ICD-10-CM | POA: Diagnosis not present

## 2022-05-18 DIAGNOSIS — Z87891 Personal history of nicotine dependence: Secondary | ICD-10-CM | POA: Insufficient documentation

## 2022-05-18 DIAGNOSIS — I10 Essential (primary) hypertension: Secondary | ICD-10-CM | POA: Diagnosis not present

## 2022-05-18 DIAGNOSIS — R519 Headache, unspecified: Secondary | ICD-10-CM

## 2022-05-18 LAB — CBC WITH DIFFERENTIAL/PLATELET
Abs Immature Granulocytes: 0.04 10*3/uL (ref 0.00–0.07)
Basophils Absolute: 0 10*3/uL (ref 0.0–0.1)
Basophils Relative: 1 %
Eosinophils Absolute: 0.1 10*3/uL (ref 0.0–0.5)
Eosinophils Relative: 3 %
HCT: 32.7 % — ABNORMAL LOW (ref 39.0–52.0)
Hemoglobin: 11.3 g/dL — ABNORMAL LOW (ref 13.0–17.0)
Immature Granulocytes: 1 %
Lymphocytes Relative: 36 %
Lymphs Abs: 1.8 10*3/uL (ref 0.7–4.0)
MCH: 30.7 pg (ref 26.0–34.0)
MCHC: 34.6 g/dL (ref 30.0–36.0)
MCV: 88.9 fL (ref 80.0–100.0)
Monocytes Absolute: 1.3 10*3/uL — ABNORMAL HIGH (ref 0.1–1.0)
Monocytes Relative: 26 %
Neutro Abs: 1.6 10*3/uL — ABNORMAL LOW (ref 1.7–7.7)
Neutrophils Relative %: 33 %
Platelets: 181 10*3/uL (ref 150–400)
RBC: 3.68 MIL/uL — ABNORMAL LOW (ref 4.22–5.81)
RDW: 16.4 % — ABNORMAL HIGH (ref 11.5–15.5)
WBC: 4.9 10*3/uL (ref 4.0–10.5)
nRBC: 0.6 % — ABNORMAL HIGH (ref 0.0–0.2)

## 2022-05-18 LAB — COMPREHENSIVE METABOLIC PANEL
ALT: 67 U/L — ABNORMAL HIGH (ref 0–44)
AST: 54 U/L — ABNORMAL HIGH (ref 15–41)
Albumin: 3.2 g/dL — ABNORMAL LOW (ref 3.5–5.0)
Alkaline Phosphatase: 70 U/L (ref 38–126)
Anion gap: 8 (ref 5–15)
BUN: 10 mg/dL (ref 6–20)
CO2: 26 mmol/L (ref 22–32)
Calcium: 8.7 mg/dL — ABNORMAL LOW (ref 8.9–10.3)
Chloride: 103 mmol/L (ref 98–111)
Creatinine, Ser: 0.75 mg/dL (ref 0.61–1.24)
GFR, Estimated: >60 mL/min (ref >60)
Glucose, Bld: 84 mg/dL (ref 70–99)
Potassium: 3 mmol/L — ABNORMAL LOW (ref 3.5–5.1)
Sodium: 137 mmol/L (ref 135–145)
Total Bilirubin: 0.5 mg/dL (ref 0.3–1.2)
Total Protein: 5.8 g/dL — ABNORMAL LOW (ref 6.5–8.1)

## 2022-05-18 LAB — MAGNESIUM: Magnesium: 1.8 mg/dL (ref 1.7–2.4)

## 2022-05-18 MED ORDER — SODIUM CHLORIDE 0.9% FLUSH
10.0000 mL | Freq: Once | INTRAVENOUS | Status: AC
  Administered 2022-05-18: 10 mL via INTRAVENOUS

## 2022-05-18 MED ORDER — PALONOSETRON HCL INJECTION 0.25 MG/5ML
0.2500 mg | Freq: Once | INTRAVENOUS | Status: AC
  Administered 2022-05-18: 0.25 mg via INTRAVENOUS
  Filled 2022-05-18: qty 5

## 2022-05-18 MED ORDER — NYSTATIN 100000 UNIT/ML MT SUSP: 5.0000 mL | Freq: Four times a day (QID) | OROMUCOSAL | 2 refills | Status: DC

## 2022-05-18 MED ORDER — ACETAMINOPHEN 325 MG PO TABS
650.0000 mg | ORAL_TABLET | Freq: Once | ORAL | Status: AC
  Administered 2022-05-18: 650 mg via ORAL
  Filled 2022-05-18: qty 2

## 2022-05-18 MED ORDER — OXALIPLATIN CHEMO INJECTION 100 MG/20ML
66.0000 mg/m2 | Freq: Once | INTRAVENOUS | Status: AC
  Administered 2022-05-18: 150 mg via INTRAVENOUS
  Filled 2022-05-18: qty 30

## 2022-05-18 MED ORDER — DEXTROSE 5 % IV SOLN: Freq: Once | INTRAVENOUS | Status: AC

## 2022-05-18 MED ORDER — FLUOROURACIL CHEMO INJECTION 2.5 GM/50ML
320.0000 mg/m2 | Freq: Once | INTRAVENOUS | Status: AC
  Administered 2022-05-18: 750 mg via INTRAVENOUS
  Filled 2022-05-18: qty 15

## 2022-05-18 MED ORDER — POTASSIUM CHLORIDE CRYS ER 20 MEQ PO TBCR
40.0000 meq | EXTENDED_RELEASE_TABLET | ORAL | Status: DC
  Administered 2022-05-18: 40 meq via ORAL
  Filled 2022-05-18: qty 2

## 2022-05-18 MED ORDER — LEUCOVORIN CALCIUM INJECTION 350 MG
320.0000 mg/m2 | Freq: Once | INTRAVENOUS | Status: AC
  Administered 2022-05-18: 740 mg via INTRAVENOUS
  Filled 2022-05-18: qty 37

## 2022-05-18 MED ORDER — SODIUM CHLORIDE 0.9 % IV SOLN
10.0000 mg | Freq: Once | INTRAVENOUS | Status: AC
  Administered 2022-05-18: 10 mg via INTRAVENOUS
  Filled 2022-05-18: qty 10

## 2022-05-18 MED ORDER — POTASSIUM CHLORIDE CRYS ER 20 MEQ PO TBCR: 20.0000 meq | EXTENDED_RELEASE_TABLET | Freq: Two times a day (BID) | ORAL | 6 refills | Status: DC

## 2022-05-18 MED ORDER — SODIUM CHLORIDE 0.9 % IV SOLN
1920.0000 mg/m2 | INTRAVENOUS | Status: DC
  Administered 2022-05-18: 4450 mg via INTRAVENOUS
  Filled 2022-05-18: qty 89
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
## 2022-05-18 NOTE — Patient Instructions (Addendum)
[place] at [facility] Discharge Instructions   You were seen and examined today by Dr. Delton Coombes.  He reviewed the results of your lab work which are normal/stable. Your potassium is low at 3.0. We will give you potassium pills in the clinic today to help correct this.   We will proceed with your treatment today.   Return as scheduled.    Thank you for choosing [place] at [facility] to provide your oncology and hematology care.  To afford each patient quality time with our provider, please arrive at least 15 minutes before your scheduled appointment time.   If you have a lab appointment with the [facility] please come in thru the Main Entrance and check in at the main information desk.  You need to re-schedule your appointment should you arrive 10 or more minutes late.  We strive to give you quality time with our providers, and arriving late affects you and other patients whose appointments are after yours.  Also, if you no show three or more times for appointments you may be dismissed from the clinic at the providers discretion.     Again, thank you for choosing [facility].  Our hope is that these requests will decrease the amount of time that you wait before being seen by our physicians.       _____________________________________________________________  Should you have questions after your visit to [facility], please contact our office at [phone] and follow the prompts.  Our office hours are 8:00 a.m. and 4:30 p.m. Monday - Friday.  Please note that voicemails left after 4:00 p.m. may not be returned until the following business day.  We are closed weekends and major holidays.  You do have access to a nurse 24-7, just call the main number to the clinic [phone] and do not press any options, hold on the line and a nurse will answer the phone.    For prescription refill requests, have your  pharmacy contact our office and allow 72 hours.    Due to Covid, you will need to wear a mask upon entering the hospital. If you do not have a mask, a mask will be given to you at the Main Entrance upon arrival. For doctor visits, patients may have 1 support person age 6 or older with them. For treatment visits, patients can not have anyone with them due to social distancing guidelines and our immunocompromised population.

## 2022-05-18 NOTE — Progress Notes (Signed)
Patient has been examined by Dr. Delton Coombes, and vital signs and labs have been reviewed. ANC, Creatinine, LFTs, hemoglobin, and platelets are within treatment parameters per M.D. - pt may proceed with treatment.  Oxaliplatin and 5FU dose reduced per MD by 20%. Primary RN and pharmacy notified.

## 2022-05-19 DIAGNOSIS — C20 Malignant neoplasm of rectum: Secondary | ICD-10-CM | POA: Diagnosis not present

## 2022-05-19 LAB — CEA: CEA: 2.5 ng/mL (ref 0.0–4.7)

## 2022-05-20 ENCOUNTER — Inpatient Hospital Stay: Payer: Medicaid Other

## 2022-05-20 VITALS — BP 102/70 | HR 102 | Temp 98.0°F | Resp 20

## 2022-05-20 DIAGNOSIS — Z5111 Encounter for antineoplastic chemotherapy: Secondary | ICD-10-CM | POA: Diagnosis not present

## 2022-05-20 DIAGNOSIS — C20 Malignant neoplasm of rectum: Secondary | ICD-10-CM

## 2022-05-20 MED ORDER — SODIUM CHLORIDE 0.9% FLUSH
10.0000 mL | INTRAVENOUS | Status: DC | PRN
  Administered 2022-05-20: 10 mL

## 2022-05-20 MED ORDER — HEPARIN SOD (PORK) LOCK FLUSH 100 UNIT/ML IV SOLN
500.0000 [IU] | Freq: Once | INTRAVENOUS | Status: AC | PRN
  Administered 2022-05-20: 500 [IU]

## 2022-05-20 NOTE — Patient Instructions (Signed)
[facility]  Discharge Instructions: Thank you for choosing [facility] to provide your oncology and hematology care.  If you have a lab appointment with the Cancer Center, please come in thru the Main Entrance and check in at the main information desk.  Wear comfortable clothing and clothing appropriate for easy access to any Portacath or PICC line.   We strive to give you quality time with your provider. You may need to reschedule your appointment if you arrive late (15 or more minutes).  Arriving late affects you and other patients whose appointments are after yours.  Also, if you miss three or more appointments without notifying the office, you may be dismissed from the clinic at the provider's discretion.      For prescription refill requests, have your pharmacy contact our office and allow 72 hours for refills to be completed.    To help prevent nausea and vomiting after your treatment, we encourage you to take your nausea medication as directed.  BELOW ARE SYMPTOMS THAT SHOULD BE REPORTED IMMEDIATELY: *FEVER GREATER THAN 100.4 F (38 C) OR HIGHER *CHILLS OR SWEATING *NAUSEA AND VOMITING THAT IS NOT CONTROLLED WITH YOUR NAUSEA MEDICATION *UNUSUAL SHORTNESS OF BREATH *UNUSUAL BRUISING OR BLEEDING *URINARY PROBLEMS (pain or burning when urinating, or frequent urination) *BOWEL PROBLEMS (unusual diarrhea, constipation, pain near the anus) TENDERNESS IN MOUTH AND THROAT WITH OR WITHOUT PRESENCE OF ULCERS (sore throat, sores in mouth, or a toothache) UNUSUAL RASH, SWELLING OR PAIN  UNUSUAL VAGINAL DISCHARGE OR ITCHING   Items with * indicate a potential emergency and should be followed up as soon as possible or go to the Emergency Department if any problems should occur.  Please show the CHEMOTHERAPY ALERT CARD or IMMUNOTHERAPY ALERT CARD at check-in to the Emergency Department and triage nurse.  Should you have questions after your visit or need to  cancel or reschedule your appointment, please contact [facility] [phone]  and follow the prompts.  Office hours are 8:00 a.m. to 4:30 p.m. Monday - Friday. Please note that voicemails left after 4:00 p.m. may not be returned until the following business day.  We are closed weekends and major holidays. You have access to a nurse at all times for urgent questions. Please call the main number to the clinic [phone] and follow the prompts.  For any non-urgent questions, you may also contact your provider using MyChart. We now offer e-Visits for anyone 18 and older to request care online for non-urgent symptoms. For details visit [email or web address].   Also download the MyChart app! Go to the app store, search "MyChart", open the app, select , and log in with your MyChart username and password.   

## 2022-05-20 NOTE — Progress Notes (Signed)
Patients port flushed without difficulty.  Good blood return noted with no bruising or swelling noted at site.  5FU pump disconnected.  Band aid applied.  VSS with discharge and left in satisfactory condition with no s/s of distress noted.

## 2022-05-27 ENCOUNTER — Other Ambulatory Visit: Payer: Medicaid Other

## 2022-05-27 ENCOUNTER — Ambulatory Visit: Payer: Medicaid Other

## 2022-05-27 ENCOUNTER — Ambulatory Visit: Payer: Medicaid Other | Admitting: Hematology

## 2022-05-29 DIAGNOSIS — H66011 Acute suppurative otitis media with spontaneous rupture of ear drum, right ear: Secondary | ICD-10-CM | POA: Diagnosis not present

## 2022-05-29 DIAGNOSIS — H7203 Central perforation of tympanic membrane, bilateral: Secondary | ICD-10-CM | POA: Diagnosis not present

## 2022-05-29 DIAGNOSIS — H9011 Conductive hearing loss, unilateral, right ear, with unrestricted hearing on the contralateral side: Secondary | ICD-10-CM | POA: Diagnosis not present

## 2022-06-01 ENCOUNTER — Inpatient Hospital Stay: Payer: Medicaid Other

## 2022-06-01 ENCOUNTER — Ambulatory Visit: Payer: Medicaid Other

## 2022-06-01 ENCOUNTER — Inpatient Hospital Stay: Payer: Medicaid Other | Admitting: Hematology

## 2022-06-01 ENCOUNTER — Other Ambulatory Visit: Payer: Medicaid Other

## 2022-06-01 VITALS — BP 125/89 | HR 88 | Temp 97.5°F | Resp 18

## 2022-06-01 DIAGNOSIS — C20 Malignant neoplasm of rectum: Secondary | ICD-10-CM

## 2022-06-01 DIAGNOSIS — Z5111 Encounter for antineoplastic chemotherapy: Secondary | ICD-10-CM | POA: Diagnosis not present

## 2022-06-01 LAB — COMPREHENSIVE METABOLIC PANEL
ALT: 41 U/L (ref 0–44)
AST: 39 U/L (ref 15–41)
Albumin: 4.3 g/dL (ref 3.5–5.0)
Alkaline Phosphatase: 89 U/L (ref 38–126)
Anion gap: 9 (ref 5–15)
BUN: 12 mg/dL (ref 6–20)
CO2: 24 mmol/L (ref 22–32)
Calcium: 9.5 mg/dL (ref 8.9–10.3)
Chloride: 95 mmol/L — ABNORMAL LOW (ref 98–111)
Creatinine, Ser: 0.88 mg/dL (ref 0.61–1.24)
GFR, Estimated: >60 mL/min (ref >60)
Glucose, Bld: 128 mg/dL — ABNORMAL HIGH (ref 70–99)
Potassium: 4.3 mmol/L (ref 3.5–5.1)
Sodium: 128 mmol/L — ABNORMAL LOW (ref 135–145)
Total Bilirubin: 1 mg/dL (ref 0.3–1.2)
Total Protein: 8.2 g/dL — ABNORMAL HIGH (ref 6.5–8.1)

## 2022-06-01 LAB — CBC WITH DIFFERENTIAL/PLATELET
Abs Immature Granulocytes: 0.02 10*3/uL (ref 0.00–0.07)
Basophils Absolute: 0.1 10*3/uL (ref 0.0–0.1)
Basophils Relative: 1 %
Eosinophils Absolute: 0.1 10*3/uL (ref 0.0–0.5)
Eosinophils Relative: 2 %
HCT: 39.7 % (ref 39.0–52.0)
Hemoglobin: 13.9 g/dL (ref 13.0–17.0)
Immature Granulocytes: 0 %
Lymphocytes Relative: 30 %
Lymphs Abs: 1.7 10*3/uL (ref 0.7–4.0)
MCH: 31 pg (ref 26.0–34.0)
MCHC: 35 g/dL (ref 30.0–36.0)
MCV: 88.4 fL (ref 80.0–100.0)
Monocytes Absolute: 1.1 10*3/uL — ABNORMAL HIGH (ref 0.1–1.0)
Monocytes Relative: 20 %
Neutro Abs: 2.6 10*3/uL (ref 1.7–7.7)
Neutrophils Relative %: 47 %
Platelets: 301 10*3/uL (ref 150–400)
RBC: 4.49 MIL/uL (ref 4.22–5.81)
RDW: 14.6 % (ref 11.5–15.5)
WBC: 5.7 10*3/uL (ref 4.0–10.5)
nRBC: 0 % (ref 0.0–0.2)

## 2022-06-01 LAB — MAGNESIUM: Magnesium: 1.9 mg/dL (ref 1.7–2.4)

## 2022-06-01 MED ORDER — FLUOROURACIL CHEMO INJECTION 2.5 GM/50ML
320.0000 mg/m2 | Freq: Once | INTRAVENOUS | Status: AC
  Administered 2022-06-01: 750 mg via INTRAVENOUS
  Filled 2022-06-01: qty 15

## 2022-06-01 MED ORDER — PALONOSETRON HCL INJECTION 0.25 MG/5ML
0.2500 mg | Freq: Once | INTRAVENOUS | Status: AC
  Administered 2022-06-01: 0.25 mg via INTRAVENOUS
  Filled 2022-06-01: qty 5

## 2022-06-01 MED ORDER — LEUCOVORIN CALCIUM INJECTION 350 MG
320.0000 mg/m2 | Freq: Once | INTRAVENOUS | Status: AC
  Administered 2022-06-01: 740 mg via INTRAVENOUS
  Filled 2022-06-01: qty 37

## 2022-06-01 MED ORDER — SODIUM CHLORIDE 0.9 % IV SOLN
10.0000 mg | Freq: Once | INTRAVENOUS | Status: AC
  Administered 2022-06-01: 10 mg via INTRAVENOUS
  Filled 2022-06-01: qty 10

## 2022-06-01 MED ORDER — SODIUM CHLORIDE 0.9 % IV SOLN
1920.0000 mg/m2 | INTRAVENOUS | Status: AC
  Administered 2022-06-01: 4450 mg via INTRAVENOUS
  Filled 2022-06-01: qty 89

## 2022-06-01 MED ORDER — CAPECITABINE 500 MG PO TABS
825.0000 mg/m2 | ORAL_TABLET | Freq: Two times a day (BID) | ORAL | 0 refills | Status: DC
  Filled 2022-06-01 – 2022-06-02 (×2): qty 240, 30d supply, fill #0

## 2022-06-01 MED ORDER — OXALIPLATIN CHEMO INJECTION 100 MG/20ML
66.0000 mg/m2 | Freq: Once | INTRAVENOUS | Status: AC
  Administered 2022-06-01: 150 mg via INTRAVENOUS
  Filled 2022-06-01: qty 10

## 2022-06-01 MED ORDER — SODIUM CHLORIDE 0.9% FLUSH
10.0000 mL | Freq: Once | INTRAVENOUS | Status: AC
  Administered 2022-06-01: 10 mL via INTRAVENOUS

## 2022-06-01 MED ORDER — DEXTROSE 5 % IV SOLN: Freq: Once | INTRAVENOUS | Status: AC

## 2022-06-01 NOTE — Progress Notes (Signed)
Patients port flushed without difficulty.  Good blood return noted with no bruising or swelling noted at site.    
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
## 2022-06-01 NOTE — Progress Notes (Unsigned)
Labs reviewed with MD today at office visit. Ok to treat per MD.

## 2022-06-01 NOTE — Progress Notes (Signed)
Patient has been examined by Dr. Katragadda, and vital signs and labs have been reviewed. ANC, Creatinine, LFTs, hemoglobin, and platelets are within treatment parameters per M.D. - pt may proceed with treatment.  Primary RN and pharmacy notified.  

## 2022-06-01 NOTE — Patient Instructions (Addendum)
[place] at [facility] Discharge Instructions   You were seen and examined today by Dr. Delton Coombes.  He reviewed the results of your lab work. Your sodium is a little low. Otherwise the results are normal/stable.   We will proceed with your final treatment today. We will also send in a prescription to our specialty pharmacy for Xeloda. This is a chemo pill that you will take on the days you have radiation treatments.   We will repeat a scan prior to next visit. We will also make a referral to the radiation oncology doctor in [place].   Return as scheduled.    Thank you for choosing [place] at [facility] to provide your oncology and hematology care.  To afford each patient quality time with our provider, please arrive at least 15 minutes before your scheduled appointment time.   If you have a lab appointment with the [facility] please come in thru the Main Entrance and check in at the main information desk.  You need to re-schedule your appointment should you arrive 10 or more minutes late.  We strive to give you quality time with our providers, and arriving late affects you and other patients whose appointments are after yours.  Also, if you no show three or more times for appointments you may be dismissed from the clinic at the providers discretion.     Again, thank you for choosing [facility].  Our hope is that these requests will decrease the amount of time that you wait before being seen by our physicians.       _____________________________________________________________  Should you have questions after your visit to [facility], please contact our office at [phone] and follow the prompts.  Our office hours are 8:00 a.m. and 4:30 p.m. Monday - Friday.  Please note that voicemails left after 4:00 p.m. may not be returned until the following business day.  We are closed weekends and major holidays.  You  do have access to a nurse 24-7, just call the main number to the clinic [phone] and do not press any options, hold on the line and a nurse will answer the phone.    For prescription refill requests, have your pharmacy contact our office and allow 72 hours.    Due to Covid, you will need to wear a mask upon entering the hospital. If you do not have a mask, a mask will be given to you at the Main Entrance upon arrival. For doctor visits, patients may have 1 support person age 36 or older with them. For treatment visits, patients can not have anyone with them due to social distancing guidelines and our immunocompromised population.

## 2022-06-02 ENCOUNTER — Other Ambulatory Visit (HOSPITAL_COMMUNITY): Payer: Self-pay

## 2022-06-02 ENCOUNTER — Encounter: Payer: Self-pay | Admitting: Hematology

## 2022-06-02 ENCOUNTER — Telehealth: Payer: Self-pay

## 2022-06-02 ENCOUNTER — Other Ambulatory Visit: Payer: Self-pay

## 2022-06-02 NOTE — Telephone Encounter (Signed)
Oral Oncology Patient Advocate Encounter  After completing a benefits investigation, prior authorization for Capecitabine is not required at this time through [facility].  Patient's copay is $4.00.     Raymond Malone, [facility]  [phone] (phone) [phone] (fax) 06/02/2022 11:14 AM

## 2022-06-03 ENCOUNTER — Inpatient Hospital Stay: Payer: Medicaid Other

## 2022-06-03 VITALS — BP 107/81 | HR 112 | Temp 98.8°F | Resp 18

## 2022-06-03 DIAGNOSIS — Z5111 Encounter for antineoplastic chemotherapy: Secondary | ICD-10-CM | POA: Diagnosis not present

## 2022-06-03 DIAGNOSIS — C20 Malignant neoplasm of rectum: Secondary | ICD-10-CM

## 2022-06-03 MED ORDER — SODIUM CHLORIDE 0.9% FLUSH
10.0000 mL | INTRAVENOUS | Status: DC | PRN
  Administered 2022-06-03: 10 mL

## 2022-06-03 MED ORDER — HEPARIN SOD (PORK) LOCK FLUSH 100 UNIT/ML IV SOLN
500.0000 [IU] | Freq: Once | INTRAVENOUS | Status: AC | PRN
  Administered 2022-06-03: 500 [IU]

## 2022-06-03 NOTE — Progress Notes (Signed)
Reviewed vital signs with the oncologist and rechecked with BP 107/77 and HR 112.  Patient denied dizziness, weakness, and fatigue.   No problems with diarrhea but does have prn nausea but no vomiting.  Stated nausea medication helps.  Patient did not want to stay for hydration and verbalized understanding when to call.    Patient for chemotherapy pump disconnect with no complaints voiced.  Patients port flushed without difficulty.  Good blood return noted with no bruising or swelling noted at site.  Band aid applied.  VSS with discharge and left ambulatory with no s/s of distress noted.

## 2022-06-03 NOTE — Patient Instructions (Signed)
[facility]  Discharge Instructions: Thank you for choosing [facility] to provide your oncology and hematology care.  If you have a lab appointment with the Cancer Center, please come in thru the Main Entrance and check in at the main information desk.  Wear comfortable clothing and clothing appropriate for easy access to any Portacath or PICC line.   We strive to give you quality time with your provider. You may need to reschedule your appointment if you arrive late (15 or more minutes).  Arriving late affects you and other patients whose appointments are after yours.  Also, if you miss three or more appointments without notifying the office, you may be dismissed from the clinic at the provider's discretion.      For prescription refill requests, have your pharmacy contact our office and allow 72 hours for refills to be completed.    To help prevent nausea and vomiting after your treatment, we encourage you to take your nausea medication as directed.  BELOW ARE SYMPTOMS THAT SHOULD BE REPORTED IMMEDIATELY: *FEVER GREATER THAN 100.4 F (38 C) OR HIGHER *CHILLS OR SWEATING *NAUSEA AND VOMITING THAT IS NOT CONTROLLED WITH YOUR NAUSEA MEDICATION *UNUSUAL SHORTNESS OF BREATH *UNUSUAL BRUISING OR BLEEDING *URINARY PROBLEMS (pain or burning when urinating, or frequent urination) *BOWEL PROBLEMS (unusual diarrhea, constipation, pain near the anus) TENDERNESS IN MOUTH AND THROAT WITH OR WITHOUT PRESENCE OF ULCERS (sore throat, sores in mouth, or a toothache) UNUSUAL RASH, SWELLING OR PAIN  UNUSUAL VAGINAL DISCHARGE OR ITCHING   Items with * indicate a potential emergency and should be followed up as soon as possible or go to the Emergency Department if any problems should occur.  Please show the CHEMOTHERAPY ALERT CARD or IMMUNOTHERAPY ALERT CARD at check-in to the Emergency Department and triage nurse.  Should you have questions after your visit or need to  cancel or reschedule your appointment, please contact [facility] [phone]  and follow the prompts.  Office hours are 8:00 a.m. to 4:30 p.m. Monday - Friday. Please note that voicemails left after 4:00 p.m. may not be returned until the following business day.  We are closed weekends and major holidays. You have access to a nurse at all times for urgent questions. Please call the main number to the clinic [phone] and follow the prompts.  For any non-urgent questions, you may also contact your provider using MyChart. We now offer e-Visits for anyone 18 and older to request care online for non-urgent symptoms. For details visit [email or web address].   Also download the MyChart app! Go to the app store, search "MyChart", open the app, select [facility], and log in with your MyChart username and password.   

## 2022-06-04 ENCOUNTER — Telehealth: Payer: Self-pay | Admitting: Pharmacist

## 2022-06-04 ENCOUNTER — Other Ambulatory Visit: Payer: Self-pay

## 2022-06-04 ENCOUNTER — Other Ambulatory Visit (HOSPITAL_COMMUNITY): Payer: Self-pay

## 2022-06-04 ENCOUNTER — Encounter: Payer: Self-pay | Admitting: Hematology

## 2022-06-04 DIAGNOSIS — C20 Malignant neoplasm of rectum: Secondary | ICD-10-CM

## 2022-06-04 MED ORDER — CAPECITABINE 500 MG PO TABS
825.0000 mg/m2 | ORAL_TABLET | Freq: Two times a day (BID) | ORAL | 0 refills | Status: DC
  Filled 2022-06-04 – 2022-06-09 (×2): qty 240, 30d supply, fill #0

## 2022-06-04 NOTE — Telephone Encounter (Signed)
Per Care Everywhere notes, pt will complete radiation at [facility].

## 2022-06-04 NOTE — Telephone Encounter (Signed)
Oral Oncology Pharmacist Encounter  Received new prescription for Xeloda (capecitabine) for the treatment of rectal adenocarcinoma in conjunction with RT, planned duration for course of radiation.  Labs from 06/01/22 assessed, CBC and CMP grossly normal. Prescription dose and frequency assessed and appropriate.  Current medication list in Epic reviewed, 1 DDI with Xeloda identified: pantoprazole may diminish the therapeutic effect of Xeloda.   Evaluated chart and no patient barriers to medication adherence identified.   Prescription has been e-scribed to the [facility] for benefits analysis and approval.  Oral Oncology Clinic will continue to follow for insurance authorization, copayment issues, initial counseling and start date.  Patient agreed to treatment on 06/01/2022 per MD documentation.  Angus Seller, PharmD Candidate [place] [facility] [place] of [facility] [place]/[facility] Oral Chemotherapy [facility] [phone]  06/04/2022 2:49 PM

## 2022-06-08 ENCOUNTER — Telehealth: Payer: Self-pay | Admitting: *Deleted

## 2022-06-08 ENCOUNTER — Other Ambulatory Visit: Payer: Self-pay | Admitting: *Deleted

## 2022-06-08 NOTE — Telephone Encounter (Signed)
Patient called stating that he has developed white splotches on tongue again, which is making it difficult for him to swallow solid food and he is nauseous and fatigued.  He has a history of thrush, therefore will pick up nystatin this morning and take this.  In the meantime, he will get some Ensure to boost his caloric intake, hopefully helping him with the fatigue.  I will follow up with him on Wednesday to see if his symptoms have resolved.  If not will schedule for symptom management appt.  Verbalized understanding.

## 2022-06-09 ENCOUNTER — Other Ambulatory Visit: Payer: Self-pay | Admitting: *Deleted

## 2022-06-09 ENCOUNTER — Other Ambulatory Visit (HOSPITAL_COMMUNITY): Payer: Self-pay

## 2022-06-10 NOTE — Telephone Encounter (Signed)
Per patient and Adonis Huguenin, RN whether the patient will proceed with radiation therapy and Xeloda will be based on upcoming MRI results. Because of this, we have asked Lilia Pro to have to office resent the Xeloda rx once it is confirmed the patient needs the Xeloda.  No further actions will be taken at this time. We will await further notification from the office.

## 2022-06-15 ENCOUNTER — Ambulatory Visit: Payer: Medicaid Other

## 2022-06-15 ENCOUNTER — Other Ambulatory Visit: Payer: Medicaid Other

## 2022-06-15 ENCOUNTER — Ambulatory Visit: Payer: Medicaid Other | Admitting: Hematology

## 2022-06-22 ENCOUNTER — Ambulatory Visit (HOSPITAL_COMMUNITY): Admission: RE | Admit: 2022-06-22 | Payer: Medicaid Other | Source: Ambulatory Visit

## 2022-06-23 ENCOUNTER — Other Ambulatory Visit: Payer: Self-pay

## 2022-06-24 ENCOUNTER — Other Ambulatory Visit: Payer: Self-pay

## 2022-06-25 ENCOUNTER — Ambulatory Visit: Payer: Medicaid Other | Admitting: Hematology

## 2022-06-29 ENCOUNTER — Other Ambulatory Visit: Payer: Self-pay | Admitting: "Endocrinology

## 2022-06-29 ENCOUNTER — Other Ambulatory Visit: Payer: Self-pay | Admitting: *Deleted

## 2022-06-29 DIAGNOSIS — E119 Type 2 diabetes mellitus without complications: Secondary | ICD-10-CM

## 2022-07-01 ENCOUNTER — Ambulatory Visit (HOSPITAL_COMMUNITY)
Admission: RE | Admit: 2022-07-01 | Discharge: 2022-07-01 | Disposition: A | Discharge status: Home / Self Care | Payer: Medicaid Other | Source: Ambulatory Visit | Attending: Hematology | Admitting: Hematology

## 2022-07-01 DIAGNOSIS — C20 Malignant neoplasm of rectum: Secondary | ICD-10-CM

## 2022-07-09 ENCOUNTER — Inpatient Hospital Stay: Payer: Medicaid Other | Attending: Hematology | Admitting: Hematology

## 2022-07-09 ENCOUNTER — Encounter: Payer: Self-pay | Admitting: Radiology

## 2022-07-09 VITALS — BP 117/88 | HR 102 | Temp 98.5°F | Resp 17 | Ht 71.0 in | Wt 225.3 lb

## 2022-07-09 DIAGNOSIS — C20 Malignant neoplasm of rectum: Secondary | ICD-10-CM | POA: Diagnosis not present

## 2022-07-09 DIAGNOSIS — E1142 Type 2 diabetes mellitus with diabetic polyneuropathy: Secondary | ICD-10-CM | POA: Diagnosis not present

## 2022-07-09 DIAGNOSIS — I10 Essential (primary) hypertension: Secondary | ICD-10-CM | POA: Diagnosis not present

## 2022-07-09 DIAGNOSIS — E876 Hypokalemia: Secondary | ICD-10-CM | POA: Diagnosis not present

## 2022-07-09 DIAGNOSIS — Z87891 Personal history of nicotine dependence: Secondary | ICD-10-CM | POA: Diagnosis not present
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
## 2022-07-13 ENCOUNTER — Telehealth (INDEPENDENT_AMBULATORY_CARE_PROVIDER_SITE_OTHER): Payer: Self-pay | Admitting: *Deleted

## 2022-07-13 DIAGNOSIS — Z008 Encounter for other general examination: Secondary | ICD-10-CM

## 2022-07-13 NOTE — Telephone Encounter (Signed)
Pt scheduled for flex sig on Thursday 07/16/22. Labs ordered, instructions sent via my chart.

## 2022-07-13 NOTE — Telephone Encounter (Signed)
-----   Message from Harvel Quale, MD sent at 07/10/2022  9:06 PM EST ----- Mariah Milling, Yeah, absolutely, will work on getting this ASAP.  Tanya/Zakaree Mcclenahan, Can you please schedule a flexible sigmoidoscopy as soon as possible (can even be a 20 minute case)? Dx: rectal cancer. Room: any  Thanks,  Maylon Peppers, MD Gastroenterology and Hepatology [facility] Gastroenterology  ----- Message ----- From: Derek Jack, MD Sent: 07/10/2022   4:41 PM EST To: Harvel Quale, MD  Quillian Quince, This is our mutual patient with newly diagnosed high rectal cancer.  He received 8 cycles of chemotherapy.  He has gotten very good response and MRI of the pelvis. Based on new clinical trial (PROSPECT), patients with T2-3/N0-1 high rectal tumors without involvement of sphincter, may forego radiation therapy and proceed directly to surgery.  5-year local recurrence and OS have been similar between the patient's with and without radiation. I have suggested that he proceed with surgery to his surgeon Dr. Dema Severin. Dr. Dema Severin requests that he undergo sigmoidoscopy to make sure that he has excellent response that was reported on the MRI. Can you accommodate him quickly for sigmoidoscopy? Thank you for your help. Dirk Dress

## 2022-07-14 ENCOUNTER — Encounter (HOSPITAL_COMMUNITY)
Admission: RE | Admit: 2022-07-14 | Discharge: 2022-07-14 | Disposition: A | Discharge status: Home / Self Care | Payer: Medicaid Other | Source: Ambulatory Visit | Attending: Gastroenterology | Admitting: Gastroenterology

## 2022-07-15 ENCOUNTER — Other Ambulatory Visit: Payer: Self-pay

## 2022-07-15 ENCOUNTER — Other Ambulatory Visit (HOSPITAL_COMMUNITY)
Admission: RE | Admit: 2022-07-15 | Discharge: 2022-07-15 | Disposition: A | Discharge status: Home / Self Care | Payer: Medicaid Other | Source: Ambulatory Visit | Attending: Gastroenterology | Admitting: Gastroenterology

## 2022-07-15 ENCOUNTER — Encounter (HOSPITAL_COMMUNITY): Payer: Self-pay

## 2022-07-15 DIAGNOSIS — Z008 Encounter for other general examination: Secondary | ICD-10-CM | POA: Diagnosis not present

## 2022-07-15 LAB — BASIC METABOLIC PANEL
Anion gap: 8 (ref 5–15)
BUN: 10 mg/dL (ref 6–20)
CO2: 25 mmol/L (ref 22–32)
Calcium: 8.8 mg/dL — ABNORMAL LOW (ref 8.9–10.3)
Chloride: 103 mmol/L (ref 98–111)
Creatinine, Ser: 0.84 mg/dL (ref 0.61–1.24)
GFR, Estimated: >60 mL/min (ref >60)
Glucose, Bld: 70 mg/dL (ref 70–99)
Potassium: 3.4 mmol/L — ABNORMAL LOW (ref 3.5–5.1)
Sodium: 136 mmol/L (ref 135–145)

## 2022-07-16 ENCOUNTER — Ambulatory Visit (HOSPITAL_COMMUNITY): Payer: Medicaid Other | Admitting: Registered Nurse

## 2022-07-16 ENCOUNTER — Ambulatory Visit (HOSPITAL_BASED_OUTPATIENT_CLINIC_OR_DEPARTMENT_OTHER): Payer: Medicaid Other | Admitting: Registered Nurse

## 2022-07-16 ENCOUNTER — Encounter (HOSPITAL_COMMUNITY)
Admission: RE | Disposition: A | Discharge status: Home / Self Care | Payer: Self-pay | Source: Home / Self Care | Attending: Gastroenterology

## 2022-07-16 ENCOUNTER — Ambulatory Visit (HOSPITAL_COMMUNITY)
Admission: RE | Admit: 2022-07-16 | Discharge: 2022-07-16 | Disposition: A | Discharge status: Home / Self Care | Payer: Medicaid Other | Attending: Gastroenterology | Admitting: Gastroenterology

## 2022-07-16 ENCOUNTER — Encounter (HOSPITAL_COMMUNITY): Payer: Self-pay | Admitting: Gastroenterology

## 2022-07-16 DIAGNOSIS — K629 Disease of anus and rectum, unspecified: Secondary | ICD-10-CM | POA: Insufficient documentation

## 2022-07-16 DIAGNOSIS — Z7984 Long term (current) use of oral hypoglycemic drugs: Secondary | ICD-10-CM | POA: Insufficient documentation

## 2022-07-16 DIAGNOSIS — C20 Malignant neoplasm of rectum: Secondary | ICD-10-CM | POA: Diagnosis present

## 2022-07-16 DIAGNOSIS — Z8249 Family history of ischemic heart disease and other diseases of the circulatory system: Secondary | ICD-10-CM | POA: Diagnosis not present

## 2022-07-16 DIAGNOSIS — K649 Unspecified hemorrhoids: Secondary | ICD-10-CM

## 2022-07-16 DIAGNOSIS — Z85048 Personal history of other malignant neoplasm of rectum, rectosigmoid junction, and anus: Secondary | ICD-10-CM | POA: Insufficient documentation

## 2022-07-16 DIAGNOSIS — Z79899 Other long term (current) drug therapy: Secondary | ICD-10-CM | POA: Insufficient documentation

## 2022-07-16 DIAGNOSIS — Z87891 Personal history of nicotine dependence: Secondary | ICD-10-CM | POA: Diagnosis not present

## 2022-07-16 DIAGNOSIS — E785 Hyperlipidemia, unspecified: Secondary | ICD-10-CM | POA: Diagnosis not present

## 2022-07-16 DIAGNOSIS — I10 Essential (primary) hypertension: Secondary | ICD-10-CM | POA: Diagnosis not present

## 2022-07-16 DIAGNOSIS — E119 Type 2 diabetes mellitus without complications: Secondary | ICD-10-CM | POA: Diagnosis not present

## 2022-07-16 DIAGNOSIS — K219 Gastro-esophageal reflux disease without esophagitis: Secondary | ICD-10-CM | POA: Insufficient documentation

## 2022-07-16 HISTORY — PX: FLEXIBLE SIGMOIDOSCOPY: SHX5431

## 2022-07-16 HISTORY — PX: BIOPSY: SHX5522

## 2022-07-16 LAB — GLUCOSE, CAPILLARY: Glucose-Capillary: 99 mg/dL (ref 70–99)

## 2022-07-16 SURGERY — SIGMOIDOSCOPY, FLEXIBLE
Anesthesia: General

## 2022-07-16 MED ORDER — LACTATED RINGERS IV SOLN: INTRAVENOUS | Status: DC | PRN

## 2022-07-16 MED ORDER — LACTATED RINGERS IV SOLN
INTRAVENOUS | Status: DC
  Administered 2022-07-16: 1000 mL via INTRAVENOUS

## 2022-07-16 MED ORDER — PROPOFOL 10 MG/ML IV BOLUS
INTRAVENOUS | Status: DC | PRN
  Administered 2022-07-16 (×3): 50 mg via INTRAVENOUS

## 2022-07-16 NOTE — Transfer of Care (Signed)
Immediate Anesthesia Transfer of Care Note  Patient: Raymond Malone  Procedure(s) Performed: FLEXIBLE SIGMOIDOSCOPY BIOPSY  Patient Location: PACU  Anesthesia Type:MAC  Level of Consciousness: awake and alert   Airway & Oxygen Therapy: Patient Spontanous Breathing and Patient connected to nasal cannula oxygen  Post-op Assessment: Report given to RN  Post vital signs: Reviewed and stable  Last Vitals:  Vitals Value Taken Time  BP 108/56 07/16/22 1448  Temp 36.7 C 07/16/22 1448  Pulse 74 07/16/22 1448  Resp    SpO2 100 % 07/16/22 1448    Last Pain:  Vitals:   07/16/22 1448  TempSrc: Oral  PainSc: 0-No pain         Complications: No notable events documented.

## 2022-07-16 NOTE — Discharge Instructions (Signed)
You are being discharged to home.  Resume your previous diet.  We are waiting for your pathology results.  Follow up with Dr. Dema Severin and Dr. Delton Coombes. Repeat flexible sigmoidoscopy 6 months after surgery.

## 2022-07-16 NOTE — Anesthesia Preprocedure Evaluation (Signed)
Anesthesia Evaluation  Patient identified by MRN, date of birth, ID band Patient awake    Reviewed: Allergy & Precautions, H&P , NPO status , Patient's Chart, lab work & pertinent test results, reviewed documented beta blocker date and time   Airway Mallampati: II  TM Distance: >3 FB Neck ROM: full    Dental no notable dental hx.    Pulmonary neg pulmonary ROS, former smoker   Pulmonary exam normal breath sounds clear to auscultation       Cardiovascular Exercise Tolerance: Good hypertension, negative cardio ROS  Rhythm:regular Rate:Normal     Neuro/Psych negative neurological ROS  negative psych ROS   GI/Hepatic Neg liver ROS,GERD  Medicated,,  Endo/Other  negative endocrine ROSdiabetes, Type 2    Renal/GU negative Renal ROS  negative genitourinary   Musculoskeletal negative musculoskeletal ROS (+)    Abdominal   Peds negative pediatric ROS (+)  Hematology negative hematology ROS (+)   Anesthesia Other Findings   Reproductive/Obstetrics negative OB ROS                             Anesthesia Physical Anesthesia Plan  ASA: 2  Anesthesia Plan: General   Post-op Pain Management:    Induction:   PONV Risk Score and Plan: Propofol infusion  Airway Management Planned:   Additional Equipment:   Intra-op Plan:   Post-operative Plan:   Informed Consent: I have reviewed the patients History and Physical, chart, labs and discussed the procedure including the risks, benefits and alternatives for the proposed anesthesia with the patient or authorized representative who has indicated his/her understanding and acceptance.     Dental Advisory Given  Plan Discussed with: CRNA  Anesthesia Plan Comments:        Anesthesia Quick Evaluation

## 2022-07-16 NOTE — H&P (Signed)
Raymond Malone is an 47 y.o. male.   Chief Complaint: follow up rectal cancer, preoperative HPI: 47 y/o M with PMH RECTAL CANCER, GERD, diabetes, hyperlipidemia, hypertension, coming for follow-up of rectal cancer and preoperative evaluation.  The patient denies having any nausea, vomiting, fever, chills, hematochezia, melena, hematemesis, abdominal distention, abdominal pain, diarrhea, jaundice, pruritus or weight loss.   Past Medical History:  Diagnosis Date   Abscess, axilla 10/06/2016   Alcohol dependence ([place]) 10/06/2016   Current smoker 10/06/2016   Dyshidrotic eczema 10/27/2016   GERD (gastroesophageal reflux disease)    Hyperlipidemia    Hyperosmolar non-ketotic state in patient with type 2 diabetes mellitus ([place]) 10/06/2016   Hypertension    MRSA (methicillin resistant Staphylococcus aureus)    Skin abscess    Type 2 diabetes mellitus ([place])     Past Surgical History:  Procedure Laterality Date   ANKLE SURGERY     BIOPSY  01/06/2022   Procedure: BIOPSY;  Surgeon: Harvel Quale, MD;  Location: [facility];  Service: Gastroenterology;;   COLONOSCOPY WITH PROPOFOL N/A 01/06/2022   Procedure: COLONOSCOPY WITH PROPOFOL;  Surgeon: Harvel Quale, MD;  Location: [facility];  Service: Gastroenterology;  Laterality: N/A;  730   HAND SURGERY     boxer's fracture L hand   INCISION AND DRAINAGE PERIRECTAL ABSCESS     IR IMAGING GUIDED PORT INSERTION  02/10/2022   MYRINGOTOMY WITH TUBE PLACEMENT Bilateral 07/22/2020   Procedure: BILATERAL MYRINGOTOMY WITH TUBE PLACEMENT;  Surgeon: Leta Baptist, MD;  Location: [facility];  Service: ENT;  Laterality: Bilateral;   POLYPECTOMY  01/06/2022   Procedure: POLYPECTOMY INTESTINAL;  Surgeon: Harvel Quale, MD;  Location: [facility];  Service: Gastroenterology;;   SVT ABLATION N/A 04/16/2021   Procedure: SVT ABLATION;  Surgeon: Evans Lance, MD;  Location: [facility];  Service:  Cardiovascular;  Laterality: N/A;    Family History  Problem Relation Age of Onset   Hypertension Mother    Social History:  reports that he has quit smoking. His smoking use included cigarettes. He started smoking about 28 years ago. He smoked an average of 1 pack per day. He has never used smokeless tobacco. He reports that he does not currently use alcohol. He reports that he does not currently use drugs after having used the following drugs: Marijuana.  Allergies: No Known Allergies  Medications Prior to Admission  Medication Sig Dispense Refill   glipiZIDE (GLUCOTROL) 5 MG tablet Take 1 tablet (5 mg total) by mouth daily before breakfast. 90 tablet 1   lisinopril-hydrochlorothiazide (ZESTORETIC) 20-25 MG tablet Take 1 tablet by mouth daily. 90 tablet 1   metFORMIN (GLUCOPHAGE) 1000 MG tablet Take 1 tablet (1,000 mg total) by mouth 2 (two) times daily with a meal. 180 tablet 0   pantoprazole (PROTONIX) 40 MG tablet Take 1 tablet (40 mg total) by mouth daily. 30 tablet 3   simvastatin (ZOCOR) 40 MG tablet Take 1 tablet (40 mg total) by mouth every evening. 90 tablet 1   Accu-Chek Softclix Lancets lancets 1 each by Other route 2 (two) times daily. Use as instructed 100 each 2   blood glucose meter kit and supplies Dispense based on patient and insurance preference. Use up to four times daily as directed. (FOR ICD-10 E10.9, E11.9). 1 each 0   Blood Glucose Monitoring Suppl (ACCU-CHEK GUIDE ME) w/Device KIT 1 Piece by Does not apply route as directed. 1 kit 0   glucose  blood (ACCU-CHEK GUIDE) test strip Use to test glucose 2 times a day. 200 each 2   Insulin Pen Needle (PEN NEEDLES) 31G X 5 MM MISC 1 each by Does not apply route in the morning, at noon, in the evening, and at bedtime. 200 each 3   lidocaine-prilocaine (EMLA) cream Apply a small amount to port a cath site and cover with plastic wrap 1 hour prior to infusion appointments (Patient not taking: Reported on 07/15/2022) 30 g 3    meloxicam (MOBIC) 7.5 MG tablet Take 1 tablet (7.5 mg total) by mouth daily. (Patient not taking: Reported on 07/15/2022) 30 tablet 5   nystatin (MYCOSTATIN) 100000 UNIT/ML suspension Take 5 mLs (500,000 Units total) by mouth 4 (four) times daily. (Patient not taking: Reported on 07/15/2022) 473 mL 2   potassium chloride SA (KLOR-CON M) 20 MEQ tablet Take 1 tablet (20 mEq total) by mouth 2 (two) times daily. 30 tablet 6   prochlorperazine (COMPAZINE) 10 MG tablet Take 1 tablet (10 mg total) by mouth every 6 (six) hours as needed for nausea or vomiting. (Patient not taking: Reported on 07/15/2022) 60 tablet 3    Results for orders placed or performed during the hospital encounter of 07/16/22 (from the past 48 hour(s))  Glucose, capillary     Status: None   Collection Time: 07/16/22 12:59 PM  Result Value Ref Range   Glucose-Capillary 99 70 - 99 mg/dL    Comment: Glucose reference range applies only to samples taken after fasting for at least 8 hours.   No results found.  Review of Systems  All other systems reviewed and are negative.   Blood pressure 119/73, pulse 74, temperature 98.3 F (36.8 C), temperature source Oral, height '5\' 11"'$  (1.803 m), weight 102.1 kg. Physical Exam  GENERAL: The patient is AO x3, in no acute distress. HEENT: Head is normocephalic and atraumatic. EOMI are intact. Mouth is well hydrated and without lesions. NECK: Supple. No masses LUNGS: Clear to auscultation. No presence of rhonchi/wheezing/rales. Adequate chest expansion HEART: RRR, normal s1 and s2. ABDOMEN: Soft, nontender, no guarding, no peritoneal signs, and nondistended. BS +. No masses. EXTREMITIES: Without any cyanosis, clubbing, rash, lesions or edema. NEUROLOGIC: AOx3, no focal motor deficit. SKIN: no jaundice, no rashes  Assessment/Plan 47 y/o M with PMH RECTAL CANCER, GERD, diabetes, hyperlipidemia, hypertension, coming for follow-up of rectal cancer and preoperative evaluation.  Will proceed with  flexible sigmoidoscopy with  Harvel Quale, MD 07/16/2022, 2:28 PM

## 2022-07-16 NOTE — Op Note (Signed)
[facility] Patient Name: Raymond Malone Procedure Date: 07/16/2022 2:21 PM MRN: [id number] Date of Birth: Feb 15, 1976 Attending MD: Maylon Peppers , , [id number] CSN: [id number] Age: 47 Admit Type: Outpatient Procedure:                Flexible Sigmoidoscopy Indications:              Personal history of malignant rectal neoplasm Providers:                Maylon Peppers, Rosina Lowenstein, RN, Kristine L.                            Risa Grill, Technician Referring MD:             Derek Jack, MD Medicines:                Monitored Anesthesia Care Complications:            No immediate complications. Estimated Blood Loss:     Estimated blood loss: none. Procedure:                Pre-Anesthesia Assessment:                           - Prior to the procedure, a History and Physical                            was performed, and patient medications, allergies                            and sensitivities were reviewed. The patient's                            tolerance of previous anesthesia was reviewed.                           - The risks and benefits of the procedure and the                            sedation options and risks were discussed with the                            patient. All questions were answered and informed                            consent was obtained.                           - ASA Grade Assessment: II - A patient with mild                            systemic disease.                           After obtaining informed consent, the scope was                            passed under direct  vision. The [id number]-[id number]                            [id number]) scope was introduced through the anus and                            advanced to the the sigmoid colon. The flexible                            sigmoidoscopy was accomplished without difficulty.                            The patient tolerated the procedure well. The                            quality of the  bowel preparation was good. Scope In: 2:38:18 PM Scope Out: 2:44:34 PM Total Procedure Duration: 0 hours 6 minutes 16 seconds  Findings:      The perianal and digital rectal examinations were normal.      A single (solitary) fifteen mm depressed area was found in the mid       rectum, 8 cm from the anal verge, at the area of known malignancy. There       was a rim of protuberant tissue surrounding it. Biopsies were taken with       a cold forceps for histology.      A tattoo was seen in the rectum, 1 cm distal to the area of known       malignancy.      Non-bleeding hemorrhoids were found during retroflexion. The hemorrhoids       were small. Impression:               - A single area of depression with surounding                            nodularity in the mid rectum, 8 cm from the anal                            verge. Biopsied.                           - A tattoo was seen in the rectum.                           - Non-bleeding hemorrhoids. Moderate Sedation:      Per Anesthesia Care Recommendation:           - Discharge patient to home (ambulatory).                           - Resume previous diet.                           - Await pathology results.                           - Follow up with Dr. Dema Severin and Dr. Delton Coombes.                           -  Repeat flexible sigmoidoscopy 6 months after                            surgery. Procedure Code(s):        --- Professional ---                           [phone], Sigmoidoscopy, flexible; with biopsy, single                            or multiple Diagnosis Code(s):        --- Professional ---                           K64.9, Unspecified hemorrhoids                           Z85.048, Personal history of other malignant                            neoplasm of rectum, rectosigmoid junction, and anus CPT copyright 2022 [employer]. All rights reserved. The codes documented in this report are preliminary and upon coder  review may  be revised to meet current compliance requirements. Maylon Peppers, MD Maylon Peppers,  07/16/2022 2:54:01 PM This report has been signed electronically. Number of Addenda: 0

## 2022-07-17 NOTE — Anesthesia Postprocedure Evaluation (Signed)
Anesthesia Post Note  Patient: Raymond Malone  Procedure(s) Performed: FLEXIBLE SIGMOIDOSCOPY BIOPSY  Patient location during evaluation: Phase II Anesthesia Type: General Level of consciousness: awake Pain management: pain level controlled Vital Signs Assessment: post-procedure vital signs reviewed and stable Respiratory status: spontaneous breathing and respiratory function stable Cardiovascular status: blood pressure returned to baseline and stable Postop Assessment: no headache and no apparent nausea or vomiting Anesthetic complications: no Comments: Late entry   No notable events documented.   Last Vitals:  Vitals:   07/16/22 1303 07/16/22 1448  BP: 119/73 (!) 108/56  Pulse: 74 74  Temp: 36.8 C 36.7 C  SpO2:  100%    Last Pain:  Vitals:   07/16/22 1448  TempSrc: Oral  PainSc: 0-No pain                 [name]

## 2022-07-20 LAB — SURGICAL PATHOLOGY

## 2022-07-27 ENCOUNTER — Encounter (HOSPITAL_COMMUNITY): Payer: Self-pay | Admitting: Gastroenterology

## 2022-08-10 ENCOUNTER — Encounter: Payer: Self-pay | Admitting: Hematology

## 2022-08-11 ENCOUNTER — Other Ambulatory Visit: Payer: Self-pay

## 2022-08-12 ENCOUNTER — Encounter: Payer: Self-pay | Admitting: Hematology

## 2022-08-14 ENCOUNTER — Encounter: Payer: Self-pay | Admitting: Family Medicine

## 2022-08-14 ENCOUNTER — Ambulatory Visit: Payer: Medicaid Other | Admitting: Family Medicine

## 2022-08-24 DIAGNOSIS — C20 Malignant neoplasm of rectum: Secondary | ICD-10-CM | POA: Diagnosis not present

## 2022-08-25 ENCOUNTER — Ambulatory Visit: Payer: Medicaid Other | Admitting: Family Medicine

## 2022-09-02 ENCOUNTER — Other Ambulatory Visit: Payer: Self-pay

## 2022-09-02 DIAGNOSIS — C20 Malignant neoplasm of rectum: Secondary | ICD-10-CM

## 2022-09-02 NOTE — Progress Notes (Signed)
Lab orders placed per Dr. Katragadda 

## 2022-09-02 NOTE — Progress Notes (Signed)
The proposed treatment discussed in conference is for discussion purpose only and is not a binding recommendation.  The patients have not been physically examined, or presented with their treatment options.  Therefore, final treatment plans cannot be decided.  
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
## 2022-09-03 ENCOUNTER — Telehealth: Payer: Self-pay | Admitting: Pharmacist

## 2022-09-03 ENCOUNTER — Encounter: Payer: Self-pay | Admitting: Pharmacist

## 2022-09-03 ENCOUNTER — Telehealth: Payer: Self-pay

## 2022-09-03 ENCOUNTER — Other Ambulatory Visit (HOSPITAL_COMMUNITY): Payer: Self-pay

## 2022-09-03 ENCOUNTER — Other Ambulatory Visit: Payer: Self-pay

## 2022-09-03 ENCOUNTER — Inpatient Hospital Stay: Payer: 59

## 2022-09-03 ENCOUNTER — Inpatient Hospital Stay: Payer: 59 | Attending: Hematology | Admitting: Hematology

## 2022-09-03 ENCOUNTER — Encounter: Payer: Self-pay | Admitting: Hematology

## 2022-09-03 VITALS — BP 119/76 | HR 80 | Temp 98.1°F | Resp 16 | Wt 230.4 lb

## 2022-09-03 DIAGNOSIS — C20 Malignant neoplasm of rectum: Secondary | ICD-10-CM

## 2022-09-03 DIAGNOSIS — E876 Hypokalemia: Secondary | ICD-10-CM | POA: Diagnosis not present

## 2022-09-03 DIAGNOSIS — E1142 Type 2 diabetes mellitus with diabetic polyneuropathy: Secondary | ICD-10-CM | POA: Diagnosis not present

## 2022-09-03 DIAGNOSIS — Z87891 Personal history of nicotine dependence: Secondary | ICD-10-CM | POA: Insufficient documentation

## 2022-09-03 LAB — CBC WITH DIFFERENTIAL/PLATELET
Abs Immature Granulocytes: 0.02 10*3/uL (ref 0.00–0.07)
Basophils Absolute: 0.1 10*3/uL (ref 0.0–0.1)
Basophils Relative: 2 %
Eosinophils Absolute: 0.7 10*3/uL — ABNORMAL HIGH (ref 0.0–0.5)
Eosinophils Relative: 11 %
HCT: 41.8 % (ref 39.0–52.0)
Hemoglobin: 14.5 g/dL (ref 13.0–17.0)
Immature Granulocytes: 0 %
Lymphocytes Relative: 27 %
Lymphs Abs: 1.6 10*3/uL (ref 0.7–4.0)
MCH: 30.7 pg (ref 26.0–34.0)
MCHC: 34.7 g/dL (ref 30.0–36.0)
MCV: 88.6 fL (ref 80.0–100.0)
Monocytes Absolute: 0.7 10*3/uL (ref 0.1–1.0)
Monocytes Relative: 11 %
Neutro Abs: 2.9 10*3/uL (ref 1.7–7.7)
Neutrophils Relative %: 49 %
Platelets: 342 10*3/uL (ref 150–400)
RBC: 4.72 MIL/uL (ref 4.22–5.81)
RDW: 11.6 % (ref 11.5–15.5)
WBC: 5.9 10*3/uL (ref 4.0–10.5)
nRBC: 0 % (ref 0.0–0.2)

## 2022-09-03 LAB — COMPREHENSIVE METABOLIC PANEL
ALT: 26 U/L (ref 0–44)
AST: 21 U/L (ref 15–41)
Albumin: 4.3 g/dL (ref 3.5–5.0)
Alkaline Phosphatase: 64 U/L (ref 38–126)
Anion gap: 8 (ref 5–15)
BUN: 13 mg/dL (ref 6–20)
CO2: 25 mmol/L (ref 22–32)
Calcium: 9.3 mg/dL (ref 8.9–10.3)
Chloride: 100 mmol/L (ref 98–111)
Creatinine, Ser: 0.81 mg/dL (ref 0.61–1.24)
GFR, Estimated: >60 mL/min (ref >60)
Glucose, Bld: 89 mg/dL (ref 70–99)
Potassium: 3.7 mmol/L (ref 3.5–5.1)
Sodium: 133 mmol/L — ABNORMAL LOW (ref 135–145)
Total Bilirubin: 0.5 mg/dL (ref 0.3–1.2)
Total Protein: 7.2 g/dL (ref 6.5–8.1)

## 2022-09-03 LAB — MAGNESIUM: Magnesium: 2.2 mg/dL (ref 1.7–2.4)

## 2022-09-03 MED ORDER — GABAPENTIN 100 MG PO CAPS: 300.0000 mg | ORAL_CAPSULE | Freq: Two times a day (BID) | ORAL | 0 refills | Status: DC

## 2022-09-03 MED ORDER — CAPECITABINE 500 MG PO TABS
825.0000 mg/m2 | ORAL_TABLET | Freq: Two times a day (BID) | ORAL | 0 refills | Status: DC
  Filled 2022-09-03 (×2): qty 224, 28d supply, fill #0

## 2022-09-03 MED ORDER — PROCHLORPERAZINE MALEATE 10 MG PO TABS: 10.0000 mg | ORAL_TABLET | Freq: Four times a day (QID) | ORAL | 3 refills | Status: DC | PRN

## 2022-09-03 MED ORDER — CAPECITABINE 500 MG PO TABS
825.0000 mg/m2 | ORAL_TABLET | Freq: Two times a day (BID) | ORAL | 0 refills | Status: DC
Start: 2022-09-03 — End: 2022-12-21
  Filled 2022-09-03 (×2): qty 224, 28d supply, fill #0

## 2022-09-03 NOTE — Patient Instructions (Signed)
[facility] at [facility] Discharge Instructions   You were seen and examined today by Dr. Ellin Saba.  He discussed with you starting a pill called Xeloda. You will take this medication twice a day,  5 days a week, on the days you have radiation treatments only. This pill will be sent to a specialty pharmacy who will reach out to you to set up delivery to your home.   We will see you weekly while you are receiving radiation and taking the Xeloda pills. We will check your lab work with each visit.    Thank you for choosing [facility] at [facility] to provide your oncology and hematology care.  To afford each patient quality time with our provider, please arrive at least 15 minutes before your scheduled appointment time.   If you have a lab appointment with the Cancer Center please come in thru the Main Entrance and check in at the main information desk.  You need to re-schedule your appointment should you arrive 10 or more minutes late.  We strive to give you quality time with our providers, and arriving late affects you and other patients whose appointments are after yours.  Also, if you no show three or more times for appointments you may be dismissed from the clinic at the providers discretion.     Again, thank you for choosing [facility]  Our hope is that these requests will decrease the amount of time that you wait before being seen by our physicians.       _____________________________________________________________  Should you have questions after your visit to [facility], please contact our office at [phone] and follow the prompts.  Our office hours are 8:00 a.m. and 4:30 p.m. Monday - Friday.  Please note that voicemails left after 4:00 p.m. may not be returned until the following business day.  We are closed weekends and major holidays.  You do have access to a nurse 24-7, just call the main number to the  clinic [phone] and do not press any options, hold on the line and a nurse will answer the phone.    For prescription refill requests, have your pharmacy contact our office and allow 72 hours.    Due to Covid, you will need to wear a mask upon entering the hospital. If you do not have a mask, a mask will be given to you at the Main Entrance upon arrival. For doctor visits, patients may have 1 support person age 1 or older with them. For treatment visits, patients can not have anyone with them due to social distancing guidelines and our immunocompromised population.

## 2022-09-03 NOTE — Progress Notes (Signed)
Per [facility] note, patient to have radiation CT sim on 09/04/22. He will likely start radiation about a week later.

## 2022-09-03 NOTE — Telephone Encounter (Signed)
Patient successfully OnBoarded and drug education provided by pharmacist. Capecitabine scheduled to be shipped on 09/04/22 for delivery on 09/07/22 from [facility] to patient's address. Patient knows to call me at [phone] with any questions or concerns regarding receiving medication.    Ardeen Fillers, CPhT Oncology Pharmacy Patient Advocate  [facility]  [phone] (phone) [phone] (fax) 09/03/2022 4:07 PM

## 2022-09-03 NOTE — Telephone Encounter (Signed)
Oral Oncology Patient Advocate Encounter  After completing a benefits investigation, prior authorization for Capecitabine is not required at this time through [facility] ([facility]).  Patient's copay is $4.00.     Ardeen Fillers, CPhT Oncology Pharmacy Patient Advocate  [facility]  [phone] (phone) [phone] (fax) 09/03/2022 10:19 AM

## 2022-09-03 NOTE — Telephone Encounter (Signed)
Oral Chemotherapy Pharmacist Encounter  [facility] (Specialty) will deliver medication to patient on 09/07/22. He knows not to start until his first day of radiation.   Patient Education I spoke with patient for overview of new oral chemotherapy medication: Xeloda (capecitabine) for the treatment of rectal adenocarcinoma in conjunction with RT, planned duration for course of radiation.   Counseled patient on administration, dosing, side effects, monitoring, drug-food interactions, safe handling, storage, and disposal. Patient will take 4 tablets (2,000 mg total) by mouth 2 (two) times daily after a meal. Take Monday-Friday. Take only on days of radiation.  Side effects include but not limited to: diarrhea, hand-foot syndrome, mouth sores, edema, decreased wbc, fatigue, N/V Diarrhea: patient knows to use loperamide as needed and to call the office if he is having 4 or more loose stools Hand-foot syndrome: recommended the use of Udderly Smooth Extra Care 20 Mouth sores: patient knows to request magic mouthwash if needed     Reviewed with patient importance of keeping a medication schedule and plan for any missed doses.  After discussion with patient no patient barriers to medication adherence identified.   Mr. Hemmer voiced understanding and appreciation. All questions answered. Medication handout provided.  Provided patient with Oral Chemotherapy Navigation Clinic phone number. Patient knows to call the office with questions or concerns. Oral Chemotherapy Navigation Clinic will continue to follow.  Remi Haggard, PharmD, BCPS, BCOP, CPP Hematology/Oncology Clinical Pharmacist Practitioner [place]/[facility] Oral Chemotherapy Navigation Clinic [phone]  09/03/2022 4:03 PM

## 2022-09-10 ENCOUNTER — Encounter: Payer: Self-pay | Admitting: Hematology

## 2022-09-15 ENCOUNTER — Encounter: Payer: Self-pay | Admitting: Hematology

## 2022-09-17 ENCOUNTER — Encounter: Payer: Self-pay | Admitting: "Endocrinology

## 2022-09-17 ENCOUNTER — Encounter: Payer: Self-pay | Admitting: Hematology

## 2022-09-17 ENCOUNTER — Ambulatory Visit: Payer: 59 | Admitting: "Endocrinology

## 2022-09-17 VITALS — BP 110/64 | HR 104 | Ht 71.0 in | Wt 230.6 lb

## 2022-09-17 DIAGNOSIS — E119 Type 2 diabetes mellitus without complications: Secondary | ICD-10-CM

## 2022-09-17 DIAGNOSIS — Z794 Long term (current) use of insulin: Secondary | ICD-10-CM

## 2022-09-17 DIAGNOSIS — I10 Essential (primary) hypertension: Secondary | ICD-10-CM

## 2022-09-17 DIAGNOSIS — E1165 Type 2 diabetes mellitus with hyperglycemia: Secondary | ICD-10-CM

## 2022-09-17 DIAGNOSIS — E782 Mixed hyperlipidemia: Secondary | ICD-10-CM

## 2022-09-17 LAB — POCT GLYCOSYLATED HEMOGLOBIN (HGB A1C): HbA1c, POC (controlled diabetic range): 6.7 % (ref 0.0–7.0)

## 2022-09-17 MED ORDER — METFORMIN HCL 500 MG PO TABS: 500.0000 mg | ORAL_TABLET | Freq: Two times a day (BID) | ORAL | 1 refills | Status: DC

## 2022-09-17 MED ORDER — EMPAGLIFLOZIN 10 MG PO TABS
10.00 mg | ORAL_TABLET | Freq: Every day | ORAL | 1 refills | Status: DC
Start: 2022-09-17 — End: 2022-11-16

## 2022-09-17 NOTE — Patient Instructions (Signed)
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
## 2022-09-18 ENCOUNTER — Other Ambulatory Visit: Payer: Self-pay

## 2022-09-18 MED ORDER — GABAPENTIN 300 MG PO CAPS: 300.0000 mg | ORAL_CAPSULE | Freq: Three times a day (TID) | ORAL | 1 refills | Status: DC

## 2022-09-22 ENCOUNTER — Ambulatory Visit: Payer: Medicaid Other | Admitting: Family Medicine

## 2022-09-22 ENCOUNTER — Other Ambulatory Visit (HOSPITAL_COMMUNITY): Payer: Self-pay

## 2022-09-23 ENCOUNTER — Inpatient Hospital Stay: Payer: Medicaid Other

## 2022-09-23 ENCOUNTER — Inpatient Hospital Stay: Payer: Medicaid Other | Admitting: Hematology

## 2022-09-24 ENCOUNTER — Other Ambulatory Visit: Payer: Self-pay | Admitting: "Endocrinology

## 2022-09-24 DIAGNOSIS — E785 Hyperlipidemia, unspecified: Secondary | ICD-10-CM

## 2022-09-24 DIAGNOSIS — E119 Type 2 diabetes mellitus without complications: Secondary | ICD-10-CM

## 2022-09-30 ENCOUNTER — Inpatient Hospital Stay: Payer: Medicaid Other | Admitting: Hematology

## 2022-09-30 ENCOUNTER — Inpatient Hospital Stay: Payer: Medicaid Other

## 2022-10-01 ENCOUNTER — Telehealth: Payer: Self-pay

## 2022-10-01 ENCOUNTER — Other Ambulatory Visit (HOSPITAL_COMMUNITY): Payer: Self-pay

## 2022-10-01 NOTE — Telephone Encounter (Signed)
Pt called concerned with his BG. Pt BG readings.   Date Before breakfast Before lunch Before supper Bedtime  09/29/22 184     09/30/22 191     10/01/22 184             Pt taking: jardiance 10mg  qd, metformin 500mg  bid

## 2022-10-01 NOTE — Telephone Encounter (Signed)
Spoke with pt, advised him to increase his jardiance to 20mg  daily at breakfast per Dr.Nida's orders. Understanding voiced.

## 2022-10-06 ENCOUNTER — Other Ambulatory Visit (HOSPITAL_COMMUNITY): Payer: Self-pay

## 2022-10-07 ENCOUNTER — Ambulatory Visit: Payer: Medicaid Other

## 2022-10-07 LAB — HM DIABETES EYE EXAM
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
## 2022-10-08 ENCOUNTER — Inpatient Hospital Stay: Payer: Self-pay | Attending: Hematology

## 2022-10-08 ENCOUNTER — Inpatient Hospital Stay (HOSPITAL_BASED_OUTPATIENT_CLINIC_OR_DEPARTMENT_OTHER): Payer: 59 | Admitting: Hematology

## 2022-10-08 VITALS — BP 137/88 | HR 100 | Temp 97.7°F | Resp 20 | Wt 225.0 lb

## 2022-10-08 DIAGNOSIS — Z87891 Personal history of nicotine dependence: Secondary | ICD-10-CM | POA: Insufficient documentation

## 2022-10-08 DIAGNOSIS — C20 Malignant neoplasm of rectum: Secondary | ICD-10-CM | POA: Diagnosis not present

## 2022-10-08 DIAGNOSIS — E1142 Type 2 diabetes mellitus with diabetic polyneuropathy: Secondary | ICD-10-CM | POA: Insufficient documentation

## 2022-10-08 DIAGNOSIS — E871 Hypo-osmolality and hyponatremia: Secondary | ICD-10-CM | POA: Insufficient documentation

## 2022-10-08 DIAGNOSIS — Z95828 Presence of other vascular implants and grafts: Secondary | ICD-10-CM

## 2022-10-08 LAB — COMPREHENSIVE METABOLIC PANEL
ALT: 30 U/L (ref 0–44)
AST: 32 U/L (ref 15–41)
Albumin: 4.1 g/dL (ref 3.5–5.0)
Alkaline Phosphatase: 62 U/L (ref 38–126)
Anion gap: 12 (ref 5–15)
BUN: 12 mg/dL (ref 6–20)
CO2: 18 mmol/L — ABNORMAL LOW (ref 22–32)
Calcium: 8.7 mg/dL — ABNORMAL LOW (ref 8.9–10.3)
Chloride: 96 mmol/L — ABNORMAL LOW (ref 98–111)
Creatinine, Ser: 1 mg/dL (ref 0.61–1.24)
GFR, Estimated: >60 mL/min (ref >60)
Glucose, Bld: 277 mg/dL — ABNORMAL HIGH (ref 70–99)
Potassium: 3.4 mmol/L — ABNORMAL LOW (ref 3.5–5.1)
Sodium: 126 mmol/L — ABNORMAL LOW (ref 135–145)
Total Bilirubin: 1 mg/dL (ref 0.3–1.2)
Total Protein: 6.7 g/dL (ref 6.5–8.1)

## 2022-10-08 LAB — CBC WITH DIFFERENTIAL/PLATELET
Abs Immature Granulocytes: 0.01 10*3/uL (ref 0.00–0.07)
Basophils Absolute: 0 10*3/uL (ref 0.0–0.1)
Basophils Relative: 1 %
Eosinophils Absolute: 0.2 10*3/uL (ref 0.0–0.5)
Eosinophils Relative: 5 %
HCT: 36.5 % — ABNORMAL LOW (ref 39.0–52.0)
Hemoglobin: 12.9 g/dL — ABNORMAL LOW (ref 13.0–17.0)
Immature Granulocytes: 0 %
Lymphocytes Relative: 12 %
Lymphs Abs: 0.5 10*3/uL — ABNORMAL LOW (ref 0.7–4.0)
MCH: 30.4 pg (ref 26.0–34.0)
MCHC: 35.3 g/dL (ref 30.0–36.0)
MCV: 86.1 fL (ref 80.0–100.0)
Monocytes Absolute: 0.5 10*3/uL (ref 0.1–1.0)
Monocytes Relative: 13 %
Neutro Abs: 2.8 10*3/uL (ref 1.7–7.7)
Neutrophils Relative %: 69 %
Platelets: 225 10*3/uL (ref 150–400)
RBC: 4.24 MIL/uL (ref 4.22–5.81)
RDW: 11.9 % (ref 11.5–15.5)
WBC: 4.1 10*3/uL (ref 4.0–10.5)
nRBC: 0 % (ref 0.0–0.2)

## 2022-10-08 LAB — MAGNESIUM: Magnesium: 2.3 mg/dL (ref 1.7–2.4)

## 2022-10-08 MED ORDER — SODIUM CHLORIDE 0.9% FLUSH
10.0000 mL | Freq: Once | INTRAVENOUS | Status: AC
  Administered 2022-10-08: 10 mL via INTRAVENOUS

## 2022-10-08 MED ORDER — HEPARIN SOD (PORK) LOCK FLUSH 100 UNIT/ML IV SOLN
500.0000 [IU] | Freq: Once | INTRAVENOUS | Status: AC
  Administered 2022-10-08: 500 [IU] via INTRAVENOUS

## 2022-10-08 NOTE — Progress Notes (Signed)
Port flushed with good blood return noted. No bruising or swelling at site. Bandaid applied and patient discharged in satisfactory condition. VVS stable with no signs or symptoms of distressed noted. 

## 2022-10-08 NOTE — Patient Instructions (Signed)
[facility]  Discharge Instructions  You were seen and examined today by Dr. Ellin Saba.  Dr. Ellin Saba discussed your most recent lab work which revealed that your sodium is low and your glucose is a little high.  Continue taking the Xeloda as prescribed.  Follow-up as scheduled in 1 week.    Thank you for choosing [facility] to provide your oncology and hematology care.   To afford each patient quality time with our provider, please arrive at least 15 minutes before your scheduled appointment time. You may need to reschedule your appointment if you arrive late (10 or more minutes). Arriving late affects you and other patients whose appointments are after yours.  Also, if you miss three or more appointments without notifying the office, you may be dismissed from the clinic at the provider's discretion.    Again, thank you for choosing [facility].  Our hope is that these requests will decrease the amount of time that you wait before being seen by our physicians.   If you have a lab appointment with the Cancer Center - please note that after April 8th, all labs will be drawn in the cancer center.  You do not have to check in or register with the main entrance as you have in the past but will complete your check-in at the cancer center.            _____________________________________________________________  Should you have questions after your visit to [facility], please contact our office at [phone] and follow the prompts.  Our office hours are 8:00 a.m. to 4:30 p.m. Monday - Thursday and 8:00 a.m. to 2:30 p.m. Friday.  Please note that voicemails left after 4:00 p.m. may not be returned until the following business day.  We are closed weekends and all major holidays.  You do have access to a nurse 24-7, just call the main number to the clinic [phone] and do not press any options, hold on the line  and a nurse will answer the phone.    For prescription refill requests, have your pharmacy contact our office and allow 72 hours.    Masks are no longer required in the cancer centers. If you would like for your care team to wear a mask while they are taking care of you, please let them know. You may have one support person who is at least 46 years old accompany you for your appointments.

## 2022-10-12 ENCOUNTER — Encounter: Payer: Self-pay | Admitting: Hematology

## 2022-10-14 ENCOUNTER — Inpatient Hospital Stay: Payer: 59 | Admitting: Hematology

## 2022-10-14 ENCOUNTER — Inpatient Hospital Stay: Payer: 59

## 2022-10-17 ENCOUNTER — Emergency Department (HOSPITAL_COMMUNITY)
Admission: EM | Admit: 2022-10-17 | Discharge: 2022-10-17 | Disposition: A | Discharge status: Home / Self Care | Payer: 59 | Attending: Emergency Medicine | Admitting: Emergency Medicine

## 2022-10-17 ENCOUNTER — Emergency Department (HOSPITAL_COMMUNITY): Payer: 59

## 2022-10-17 ENCOUNTER — Encounter (HOSPITAL_COMMUNITY): Payer: Self-pay | Admitting: Emergency Medicine

## 2022-10-17 ENCOUNTER — Encounter: Payer: Self-pay | Admitting: Hematology

## 2022-10-17 ENCOUNTER — Other Ambulatory Visit: Payer: Self-pay

## 2022-10-17 DIAGNOSIS — Z79899 Other long term (current) drug therapy: Secondary | ICD-10-CM | POA: Diagnosis not present

## 2022-10-17 DIAGNOSIS — K6289 Other specified diseases of anus and rectum: Secondary | ICD-10-CM | POA: Diagnosis present

## 2022-10-17 DIAGNOSIS — R1032 Left lower quadrant pain: Secondary | ICD-10-CM | POA: Insufficient documentation

## 2022-10-17 DIAGNOSIS — Z794 Long term (current) use of insulin: Secondary | ICD-10-CM | POA: Insufficient documentation

## 2022-10-17 DIAGNOSIS — K219 Gastro-esophageal reflux disease without esophagitis: Secondary | ICD-10-CM | POA: Insufficient documentation

## 2022-10-17 DIAGNOSIS — Z7984 Long term (current) use of oral hypoglycemic drugs: Secondary | ICD-10-CM | POA: Insufficient documentation

## 2022-10-17 DIAGNOSIS — C20 Malignant neoplasm of rectum: Secondary | ICD-10-CM | POA: Insufficient documentation

## 2022-10-17 DIAGNOSIS — E119 Type 2 diabetes mellitus without complications: Secondary | ICD-10-CM | POA: Insufficient documentation

## 2022-10-17 DIAGNOSIS — I10 Essential (primary) hypertension: Secondary | ICD-10-CM | POA: Diagnosis not present

## 2022-10-17 DIAGNOSIS — K5900 Constipation, unspecified: Secondary | ICD-10-CM | POA: Insufficient documentation

## 2022-10-17 LAB — CBC
HCT: 40.8 % (ref 39.0–52.0)
Hemoglobin: 14.4 g/dL (ref 13.0–17.0)
MCH: 30.8 pg (ref 26.0–34.0)
MCHC: 35.3 g/dL (ref 30.0–36.0)
MCV: 87.2 fL (ref 80.0–100.0)
Platelets: 202 10*3/uL (ref 150–400)
RBC: 4.68 MIL/uL (ref 4.22–5.81)
RDW: 13.2 % (ref 11.5–15.5)
WBC: 5.5 10*3/uL (ref 4.0–10.5)
nRBC: 0 % (ref 0.0–0.2)

## 2022-10-17 LAB — URINALYSIS, ROUTINE W REFLEX MICROSCOPIC
Bacteria, UA: NONE SEEN
Bilirubin Urine: NEGATIVE
Glucose, UA: >=500 mg/dL — AB
Hgb urine dipstick: NEGATIVE
Ketones, ur: NEGATIVE mg/dL
Leukocytes,Ua: NEGATIVE
Nitrite: NEGATIVE
Protein, ur: NEGATIVE mg/dL
Specific Gravity, Urine: 1.033 — ABNORMAL HIGH (ref 1.005–1.030)
pH: 5 (ref 5.0–8.0)

## 2022-10-17 LAB — COMPREHENSIVE METABOLIC PANEL
ALT: 25 U/L (ref 0–44)
AST: 20 U/L (ref 15–41)
Albumin: 4.2 g/dL (ref 3.5–5.0)
Alkaline Phosphatase: 74 U/L (ref 38–126)
Anion gap: 10 (ref 5–15)
BUN: 11 mg/dL (ref 6–20)
CO2: 20 mmol/L — ABNORMAL LOW (ref 22–32)
Calcium: 9 mg/dL (ref 8.9–10.3)
Chloride: 105 mmol/L (ref 98–111)
Creatinine, Ser: 0.83 mg/dL (ref 0.61–1.24)
GFR, Estimated: >60 mL/min (ref >60)
Glucose, Bld: 192 mg/dL — ABNORMAL HIGH (ref 70–99)
Potassium: 3.6 mmol/L (ref 3.5–5.1)
Sodium: 135 mmol/L (ref 135–145)
Total Bilirubin: 0.7 mg/dL (ref 0.3–1.2)
Total Protein: 7.3 g/dL (ref 6.5–8.1)

## 2022-10-17 LAB — POC OCCULT BLOOD, ED: Fecal Occult Bld: NEGATIVE

## 2022-10-17 LAB — LIPASE, BLOOD: Lipase: 28 U/L (ref 11–51)

## 2022-10-17 MED ORDER — ONDANSETRON HCL 4 MG/2ML IJ SOLN
4.0000 mg | Freq: Once | INTRAMUSCULAR | Status: AC
  Administered 2022-10-17: 4 mg via INTRAVENOUS
  Filled 2022-10-17: qty 2

## 2022-10-17 MED ORDER — LIDOCAINE VISCOUS HCL 2 % MT SOLN: OROMUCOSAL | 0 refills | Status: DC

## 2022-10-17 MED ORDER — LIDOCAINE VISCOUS HCL 2 % MT SOLN
15.0000 mL | Freq: Once | OROMUCOSAL | Status: AC
  Administered 2022-10-17: 15 mL via OROMUCOSAL
  Filled 2022-10-17: qty 15

## 2022-10-17 MED ORDER — MORPHINE SULFATE (PF) 4 MG/ML IV SOLN
4.0000 mg | Freq: Once | INTRAVENOUS | Status: AC
  Administered 2022-10-17: 4 mg via INTRAVENOUS
  Filled 2022-10-17: qty 1

## 2022-10-17 MED ORDER — IOHEXOL 300 MG/ML  SOLN
100.0000 mL | Freq: Once | INTRAMUSCULAR | Status: AC | PRN
  Administered 2022-10-17: 100 mL via INTRAVENOUS

## 2022-10-17 NOTE — ED Triage Notes (Signed)
Pt with rectal Cancer. Had radiation treatment on yesterday. States that when he returned home, he started having pain in his rectum. States he feels constipated but has had diarrhea. States he still has that "constipated feeling"  which is causing him abdominal pain. Reports use of laxatives around 2-3pm today and an enema around 5-6pm tonight. Reports no change from taking either.

## 2022-10-17 NOTE — Discharge Instructions (Addendum)
Your lab tests and CT imaging are reassuring today.  Plan to keep your appointment on Monday with your radiation oncologist,

## 2022-10-17 NOTE — ED Notes (Signed)
Pt ambulated  to restroom at this time urine obtained at this time at the bedside.  Raymond Malone

## 2022-10-19 ENCOUNTER — Encounter (HOSPITAL_COMMUNITY): Payer: Self-pay | Admitting: *Deleted

## 2022-10-19 ENCOUNTER — Encounter: Payer: Self-pay | Admitting: Hematology

## 2022-10-19 ENCOUNTER — Emergency Department (HOSPITAL_COMMUNITY)
Admission: EM | Admit: 2022-10-19 | Discharge: 2022-10-19 | Disposition: A | Discharge status: Home / Self Care | Payer: 59 | Attending: Emergency Medicine | Admitting: Emergency Medicine

## 2022-10-19 ENCOUNTER — Other Ambulatory Visit: Payer: Self-pay

## 2022-10-19 ENCOUNTER — Emergency Department (HOSPITAL_COMMUNITY): Payer: 59

## 2022-10-19 ENCOUNTER — Telehealth: Payer: Self-pay

## 2022-10-19 DIAGNOSIS — Z794 Long term (current) use of insulin: Secondary | ICD-10-CM | POA: Diagnosis not present

## 2022-10-19 DIAGNOSIS — I10 Essential (primary) hypertension: Secondary | ICD-10-CM | POA: Insufficient documentation

## 2022-10-19 DIAGNOSIS — K6289 Other specified diseases of anus and rectum: Secondary | ICD-10-CM | POA: Insufficient documentation

## 2022-10-19 DIAGNOSIS — Z79899 Other long term (current) drug therapy: Secondary | ICD-10-CM | POA: Diagnosis not present

## 2022-10-19 DIAGNOSIS — Z7984 Long term (current) use of oral hypoglycemic drugs: Secondary | ICD-10-CM | POA: Insufficient documentation

## 2022-10-19 DIAGNOSIS — Z85048 Personal history of other malignant neoplasm of rectum, rectosigmoid junction, and anus: Secondary | ICD-10-CM | POA: Diagnosis not present

## 2022-10-19 DIAGNOSIS — E119 Type 2 diabetes mellitus without complications: Secondary | ICD-10-CM | POA: Insufficient documentation

## 2022-10-19 DIAGNOSIS — R1084 Generalized abdominal pain: Secondary | ICD-10-CM | POA: Diagnosis present

## 2022-10-19 DIAGNOSIS — K5901 Slow transit constipation: Secondary | ICD-10-CM | POA: Insufficient documentation

## 2022-10-19 LAB — CBC WITH DIFFERENTIAL/PLATELET
Abs Immature Granulocytes: 0.02 10*3/uL (ref 0.00–0.07)
Basophils Absolute: 0 10*3/uL (ref 0.0–0.1)
Basophils Relative: 0 %
Eosinophils Absolute: 0.1 10*3/uL (ref 0.0–0.5)
Eosinophils Relative: 2 %
HCT: 44.5 % (ref 39.0–52.0)
Hemoglobin: 15.6 g/dL (ref 13.0–17.0)
Immature Granulocytes: 0 %
Lymphocytes Relative: 11 %
Lymphs Abs: 0.6 10*3/uL — ABNORMAL LOW (ref 0.7–4.0)
MCH: 30.3 pg (ref 26.0–34.0)
MCHC: 35.1 g/dL (ref 30.0–36.0)
MCV: 86.4 fL (ref 80.0–100.0)
Monocytes Absolute: 0.6 10*3/uL (ref 0.1–1.0)
Monocytes Relative: 12 %
Neutro Abs: 3.9 10*3/uL (ref 1.7–7.7)
Neutrophils Relative %: 75 %
Platelets: 232 10*3/uL (ref 150–400)
RBC: 5.15 MIL/uL (ref 4.22–5.81)
RDW: 13.5 % (ref 11.5–15.5)
WBC: 5.3 10*3/uL (ref 4.0–10.5)
nRBC: 0 % (ref 0.0–0.2)

## 2022-10-19 LAB — URINALYSIS, ROUTINE W REFLEX MICROSCOPIC
Bacteria, UA: NONE SEEN
Bilirubin Urine: NEGATIVE
Glucose, UA: >=500 mg/dL — AB
Hgb urine dipstick: NEGATIVE
Ketones, ur: 20 mg/dL — AB
Leukocytes,Ua: NEGATIVE
Nitrite: NEGATIVE
Protein, ur: NEGATIVE mg/dL
Specific Gravity, Urine: 1.036 — ABNORMAL HIGH (ref 1.005–1.030)
pH: 5 (ref 5.0–8.0)

## 2022-10-19 LAB — COMPREHENSIVE METABOLIC PANEL
ALT: 29 U/L (ref 0–44)
AST: 25 U/L (ref 15–41)
Albumin: 4.6 g/dL (ref 3.5–5.0)
Alkaline Phosphatase: 79 U/L (ref 38–126)
Anion gap: 12 (ref 5–15)
BUN: 14 mg/dL (ref 6–20)
CO2: 25 mmol/L (ref 22–32)
Calcium: 9.5 mg/dL (ref 8.9–10.3)
Chloride: 94 mmol/L — ABNORMAL LOW (ref 98–111)
Creatinine, Ser: 1.13 mg/dL (ref 0.61–1.24)
GFR, Estimated: >60 mL/min (ref >60)
Glucose, Bld: 116 mg/dL — ABNORMAL HIGH (ref 70–99)
Potassium: 3.7 mmol/L (ref 3.5–5.1)
Sodium: 131 mmol/L — ABNORMAL LOW (ref 135–145)
Total Bilirubin: 1.3 mg/dL — ABNORMAL HIGH (ref 0.3–1.2)
Total Protein: 8.2 g/dL — ABNORMAL HIGH (ref 6.5–8.1)

## 2022-10-19 LAB — LIPASE, BLOOD: Lipase: 25 U/L (ref 11–51)

## 2022-10-19 MED ORDER — MORPHINE SULFATE (PF) 4 MG/ML IV SOLN
2.0000 mg | Freq: Once | INTRAVENOUS | Status: AC
  Administered 2022-10-19: 2 mg via INTRAVENOUS
  Filled 2022-10-19: qty 1

## 2022-10-19 MED ORDER — CIPROFLOXACIN HCL 250 MG PO TABS: 500.0000 mg | ORAL_TABLET | Freq: Once | ORAL | Status: DC

## 2022-10-19 MED ORDER — ONDANSETRON HCL 4 MG PO TABS: 4.0000 mg | ORAL_TABLET | Freq: Three times a day (TID) | ORAL | 0 refills | Status: AC | PRN

## 2022-10-19 MED ORDER — OXYCODONE HCL 5 MG PO TABS: 5.0000 mg | ORAL_TABLET | Freq: Four times a day (QID) | ORAL | 0 refills | Status: AC | PRN

## 2022-10-19 MED ORDER — IOHEXOL 300 MG/ML  SOLN
100.0000 mL | Freq: Once | INTRAMUSCULAR | Status: AC | PRN
  Administered 2022-10-19: 100 mL via INTRAVENOUS

## 2022-10-19 MED ORDER — AMOXICILLIN-POT CLAVULANATE 875-125 MG PO TABS
1.0000 | ORAL_TABLET | Freq: Once | ORAL | Status: AC
  Administered 2022-10-19: 1 via ORAL
  Filled 2022-10-19: qty 1

## 2022-10-19 MED ORDER — MORPHINE SULFATE (PF) 4 MG/ML IV SOLN
4.0000 mg | Freq: Once | INTRAVENOUS | Status: AC
  Administered 2022-10-19: 4 mg via INTRAVENOUS
  Filled 2022-10-19: qty 1

## 2022-10-19 MED ORDER — LACTATED RINGERS IV BOLUS
1000.0000 mL | Freq: Once | INTRAVENOUS | Status: AC
  Administered 2022-10-19: 1000 mL via INTRAVENOUS

## 2022-10-19 MED ORDER — ONDANSETRON HCL 4 MG/2ML IJ SOLN
4.0000 mg | Freq: Once | INTRAMUSCULAR | Status: AC
  Administered 2022-10-19: 4 mg via INTRAVENOUS
  Filled 2022-10-19: qty 2

## 2022-10-19 MED ORDER — AMOXICILLIN-POT CLAVULANATE 875-125 MG PO TABS: 1.0000 | ORAL_TABLET | Freq: Two times a day (BID) | ORAL | 0 refills | Status: AC

## 2022-10-19 MED ORDER — POLYETHYLENE GLYCOL 3350 17 G PO PACK: 17.0000 g | PACK | Freq: Every day | ORAL | 0 refills | Status: AC

## 2022-10-19 NOTE — ED Provider Notes (Signed)
EMERGENCY DEPARTMENT AT [facility] Provider Note   CSN: [id number] Arrival date & time: 10/19/22  1914     History  Chief Complaint  Patient presents with   Constipation    Raymond Malone is a 47 y.o. male with past medical history hypertension, hyperlipidemia, GERD, type 2 diabetes, stage II rectal adenocarcinoma currently undergoing radiation therapy (has completed chemo) who presents to ED c/o generalized abdominal pain and constipation for 3 days. Reports his last bowel movement was 3 days ago and was normal. No rectal bleeding, fever, chills, nausea, or vomiting. No history of bowel obstruction. No other concerns today.       Home Medications Prior to Admission medications   Medication Sig Start Date End Date Taking? Authorizing Provider  amoxicillin-clavulanate (AUGMENTIN) 875-125 MG tablet Take 1 tablet by mouth every 12 (twelve) hours for 7 days. 10/19/22 10/26/22 Yes Denham Mose L, PA-C  ondansetron (ZOFRAN) 4 MG tablet Take 1 tablet (4 mg total) by mouth every 8 (eight) hours as needed for up to 3 days for nausea or vomiting. 10/19/22 10/22/22 Yes Skylie Hiott L, PA-C  oxyCODONE (ROXICODONE) 5 MG immediate release tablet Take 1 tablet (5 mg total) by mouth every 6 (six) hours as needed for up to 3 days for severe pain or breakthrough pain. 10/19/22 10/22/22 Yes Cherree Conerly L, PA-C  polyethylene glycol (MIRALAX) 17 g packet Take 17 g by mouth daily for 5 days. 10/19/22 10/24/22 Yes Javid Kemler L, PA-C  Accu-Chek Softclix Lancets lancets 1 each by Other route 2 (two) times daily. Use as instructed 12/09/21   Roma Kayser, MD  blood glucose meter kit and supplies Dispense based on patient and insurance preference. Use up to four times daily as directed. (FOR ICD-10 E10.9, E11.9). 02/10/21   Heather Roberts, NP  Blood Glucose Monitoring Suppl (ACCU-CHEK GUIDE ME) w/Device KIT 1 Piece by Does not apply route as directed. 12/09/21   Roma Kayser,  MD  capecitabine (XELODA) 500 MG tablet Take 4 tablets (2,000 mg total) by mouth 2 (two) times daily after a meal. Take Monday-Friday. Take only on days of radiation. 09/03/22   Doreatha Massed, MD  empagliflozin (JARDIANCE) 10 MG TABS tablet Take 1 tablet (10 mg total) by mouth daily before breakfast. 09/17/22   Nida, Denman George, MD  gabapentin (NEURONTIN) 300 MG capsule Take 1 capsule (300 mg total) by mouth 3 (three) times daily. 09/18/22   Doreatha Massed, MD  glucose blood (ACCU-CHEK GUIDE) test strip Use to test glucose 2 times a day. 12/09/21   Roma Kayser, MD  Insulin Pen Needle (PEN NEEDLES) 31G X 5 MM MISC 1 each by Does not apply route in the morning, at noon, in the evening, and at bedtime. 09/22/21   Gilmore Laroche, FNP  lidocaine (XYLOCAINE) 2 % solution Apply 5 mL to rectal area using cotton ball every 3 hours prn rectal pain 10/17/22   Burgess Amor, PA-C  lidocaine-prilocaine (EMLA) cream Apply a small amount to port a cath site and cover with plastic wrap 1 hour prior to infusion appointments 02/10/22   Doreatha Massed, MD  lisinopril-hydrochlorothiazide (ZESTORETIC) 20-25 MG tablet Take 1 tablet by mouth daily. 03/27/22   Gilmore Laroche, FNP  meloxicam (MOBIC) 7.5 MG tablet Take 1 tablet (7.5 mg total) by mouth daily. 02/12/22   Vickki Hearing, MD  metFORMIN (GLUCOPHAGE) 500 MG tablet Take 1 tablet (500 mg total) by mouth 2 (two) times daily with a meal.  09/17/22   Roma Kayser, MD  nystatin (MYCOSTATIN) 100000 UNIT/ML suspension Take 5 mLs (500,000 Units total) by mouth 4 (four) times daily. 05/18/22   Doreatha Massed, MD  pantoprazole (PROTONIX) 40 MG tablet Take 1 tablet (40 mg total) by mouth daily. 03/27/22   Gilmore Laroche, FNP  potassium chloride SA (KLOR-CON M) 20 MEQ tablet Take 1 tablet (20 mEq total) by mouth 2 (two) times daily. 05/18/22   Doreatha Massed, MD  prochlorperazine (COMPAZINE) 10 MG tablet Take 1 tablet (10 mg total) by  mouth every 6 (six) hours as needed for nausea or vomiting. 09/03/22   Doreatha Massed, MD  simvastatin (ZOCOR) 40 MG tablet TAKE 1 TABLET BY MOUTH ONCE DAILY IN THE EVENING 09/24/22   Roma Kayser, MD      Allergies    Patient has no known allergies.    Review of Systems   Review of Systems  All other systems reviewed and are negative.   Physical Exam Updated Vital Signs BP 104/86 (BP Location: Right Arm)   Pulse (!) 115   Temp 99.1 F (37.3 C) (Oral)   Resp 18   Ht 5\' 11"  (1.803 m)   Wt 98 kg   SpO2 98%   BMI 30.13 kg/m  Physical Exam Vitals and nursing note reviewed.  Constitutional:      General: He is not in acute distress.    Appearance: Normal appearance.  HENT:     Head: Normocephalic and atraumatic.     Mouth/Throat:     Mouth: Mucous membranes are moist.  Eyes:     Conjunctiva/sclera: Conjunctivae normal.  Cardiovascular:     Rate and Rhythm: Normal rate and regular rhythm.     Heart sounds: No murmur heard. Pulmonary:     Effort: Pulmonary effort is normal.     Breath sounds: Normal breath sounds.  Abdominal:     General: Abdomen is flat. There is no distension.     Palpations: Abdomen is soft.     Tenderness: There is abdominal tenderness (generalized worst to LLQ). There is no right CVA tenderness, left CVA tenderness, guarding or rebound.  Musculoskeletal:        General: Normal range of motion.     Cervical back: Neck supple.     Right lower leg: No edema.     Left lower leg: No edema.  Skin:    General: Skin is warm and dry.     Capillary Refill: Capillary refill takes less than 2 seconds.     Coloration: Skin is not jaundiced or pale.  Neurological:     Mental Status: He is alert. Mental status is at baseline.  Psychiatric:        Behavior: Behavior normal.     ED Results / Procedures / Treatments   Labs (all labs ordered are listed, but only abnormal results are displayed) Labs Reviewed  CBC WITH DIFFERENTIAL/PLATELET -  Abnormal; Notable for the following components:      Result Value   Lymphs Abs 0.6 (*)    All other components within normal limits  COMPREHENSIVE METABOLIC PANEL - Abnormal; Notable for the following components:   Sodium 131 (*)    Chloride 94 (*)    Glucose, Bld 116 (*)    Total Protein 8.2 (*)    Total Bilirubin 1.3 (*)    All other components within normal limits  URINALYSIS, ROUTINE W REFLEX MICROSCOPIC - Abnormal; Notable for the following components:   Specific Gravity, Urine  1.036 (*)    Glucose, UA >=500 (*)    Ketones, ur 20 (*)    All other components within normal limits  LIPASE, BLOOD    EKG None  Radiology CT ABDOMEN PELVIS W CONTRAST  Result Date: 10/19/2022 CLINICAL DATA:  Bowel obstruction suspected. Rectal pain. History of rectal cancer undergoing radiation therapy. EXAM: CT ABDOMEN AND PELVIS WITH CONTRAST TECHNIQUE: Multidetector CT imaging of the abdomen and pelvis was performed using the standard protocol following bolus administration of intravenous contrast. RADIATION DOSE REDUCTION: This exam was performed according to the departmental dose-optimization program which includes automated exposure control, adjustment of the mA and/or kV according to patient size and/or use of iterative reconstruction technique. CONTRAST:  OMNIPAQUE IOHEXOL 300 MG/ML  SOLN COMPARISON:  CT abdomen and pelvis 10/17/2022 FINDINGS: Lower chest: No acute abnormality. Hepatobiliary: Hepatic steatosis. Unremarkable gallbladder. No biliary dilatation. Pancreas: Unremarkable. Spleen: Unremarkable. Adrenals/Urinary Tract: Unremarkable adrenal glands. No evidence of a renal mass, calculi, or hydronephrosis. Unremarkable bladder. Stomach/Bowel: The stomach is unremarkable. Mild rectal wall thickening is similar to the recent prior CT. There is also mild circumferential wall thickening involving a short segment of the distal ileum, more prominent than on the prior study although comparison is  limited by absent distension on the prior study. There is mild left-sided colonic diverticulosis without evidence of acute diverticulitis. The appendix is unremarkable. Vascular/Lymphatic: Mild abdominal aortic atherosclerosis without aneurysm. No enlarged lymph nodes. The previously noted left external iliac lymph node is subcentimeter in short axis. Reproductive: Mildly enlarged prostate. Other: No ascites or pneumoperitoneum. Musculoskeletal: No acute osseous abnormality or suspicious osseous lesion. IMPRESSION: 1. Unchanged mild rectal wall thickening which could reflect inflammatory or infectious proctitis. 2. Mild wall thickening of the distal ileum which could reflect enteritis. 3. Hepatic steatosis. 4. Mild colonic diverticulosis. 5.  Aortic Atherosclerosis (ICD10-I70.0). Electronically Signed   By: Sebastian Ache M.D.   On: 10/19/2022 13:35   CT ABDOMEN PELVIS W CONTRAST  Addendum Date: 10/17/2022   ADDENDUM REPORT: 10/17/2022 22:51 ADDENDUM: Correction to IMPRESSION: Minimal diverticulosis at the rectosigmoid colon without evidence of diverticulitis. Electronically Signed   By: Thornell Sartorius M.D.   On: 10/17/2022 22:51   Result Date: 10/17/2022 CLINICAL DATA:  Pain in rectum. History of rectal cancer with radiation treatment yesterday. EXAM: CT ABDOMEN AND PELVIS WITH CONTRAST TECHNIQUE: Multidetector CT imaging of the abdomen and pelvis was performed using the standard protocol following bolus administration of intravenous contrast. RADIATION DOSE REDUCTION: This exam was performed according to the departmental dose-optimization program which includes automated exposure control, adjustment of the mA and/or kV according to patient size and/or use of iterative reconstruction technique. CONTRAST:  OMNIPAQUE IOHEXOL 300 MG/ML  SOLN COMPARISON:  01/08/2022, 07/01/2022. FINDINGS: Lower chest: No acute abnormality. Hepatobiliary: No focal liver abnormality is seen. There is fatty infiltration of the  liver. No gallstones, gallbladder wall thickening, or biliary dilatation. Pancreas: Unremarkable. No pancreatic ductal dilatation or surrounding inflammatory changes. Spleen: Normal in size without focal abnormality. Adrenals/Urinary Tract: The adrenal glands are within normal limits. The kidneys enhance symmetrically. No renal calculus or hydronephrosis. The bladder is unremarkable. Stomach/Bowel: The bladder is within normal limits. No obstruction, free air, or pneumatosis. Appendix appears normal. Few scattered diverticula are present along the sigmoid colon without evidence of diverticulitis. There is mild bowel wall thickening at the rectosigmoid colon with minimal surrounding fat stranding. Vascular/Lymphatic: Aortic atherosclerosis. A 1.2 cm lymph node is noted along the external iliac chain on the left, decreased  in size from the prior exam. Reproductive: Prostate gland is mildly enlarged. Other: No abdominopelvic ascites. There is a small fat containing umbilical hernia. Musculoskeletal: Mild degenerative changes are present in the thoracolumbar spine. No acute or suspicious osseous abnormality. IMPRESSION: 1. Mild thickening of the walls of the rectosigmoid colon with minimal fat stranding, possible infectious or inflammatory colitis. The possibility of residual malignancy can not be assessed on this modality. 2. Prominent lymph node along the external iliac chain on the left, decreased in size from the prior exam. 3. Mild diverticulitis at the rectosigmoid colon without evidence of diverticulitis. 4. Hepatic steatosis. 5. Aortic atherosclerosis. Electronically Signed: By: Thornell Sartorius M.D. On: 10/17/2022 22:15    Procedures Procedures    Medications Ordered in ED Medications  amoxicillin-clavulanate (AUGMENTIN) 875-125 MG per tablet 1 tablet (has no administration in time range)  morphine (PF) 4 MG/ML injection 4 mg (4 mg Intravenous Given 10/19/22 1155)  ondansetron (ZOFRAN) injection 4 mg (4  mg Intravenous Given 10/19/22 1156)  lactated ringers bolus 1,000 mL (0 mLs Intravenous Stopped 10/19/22 1427)  iohexol (OMNIPAQUE) 300 MG/ML solution 100 mL (100 mLs Intravenous Contrast Given 10/19/22 1235)  morphine (PF) 4 MG/ML injection 2 mg (2 mg Intravenous Given 10/19/22 1427)    ED Course/ Medical Decision Making/ A&P                             Medical Decision Making Amount and/or Complexity of Data Reviewed Labs: ordered. Decision-making details documented in ED Course. Radiology: ordered. Decision-making details documented in ED Course.  Risk Prescription drug management.   Medical Decision Making:   ROMMEL PANDA is a 47 y.o. male who presented to the ED today with abdominal pain detailed above.    Patient's presentation is complicated by their history of rectal adenocarcinoma.  Complete initial physical exam performed, notably the patient  was in NAD.  He had no symptoms at all tenderness worse in the left lower quadrant.  No obvious distention.  No CVA tenderness.  Patient nontoxic-appearing.    Reviewed and confirmed nursing documentation for past medical history, family history, social history.    Initial Assessment:   With the patient's presentation of abdominal pain, differential diagnosis includes but is not limited to AAA, mesenteric ischemia, appendicitis, diverticulitis, DKA, gastritis, gastroenteritis, AMI, nephrolithiasis, pancreatitis, peritonitis, adrenal insufficiency, intestinal ischemia, constipation, UTI, SBO/LBO, splenic rupture, biliary disease, IBD, IBS, PUD, hepatitis, STD, ovarian/testicular torsion, electrolyte disturbance, DKA, dehydration, acute kidney injury, renal failure, cholecystitis, cholelithiasis, choledocholithiasis, abdominal pain of  unknown etiology.   Initial Plan:  Screening labs including CBC and Metabolic panel to evaluate for infectious or metabolic etiology of disease.  Lipase to evaluate for pancreatitis Urinalysis with reflex  culture ordered to evaluate for UTI or relevant urologic/nephrologic pathology.  CT abd/pelvis to evaluate for intra-abdominal pathology Symptomatic management Objective evaluation as reviewed   Initial Study Results:   Laboratory  All laboratory results reviewed without evidence of clinically relevant pathology.   Exceptions include: Sodium 131, chloride 94 Radiology:  All images reviewed independently. Agree with radiology report at this time.   CT ABDOMEN PELVIS W CONTRAST  Result Date: 10/19/2022 CLINICAL DATA:  Bowel obstruction suspected. Rectal pain. History of rectal cancer undergoing radiation therapy. EXAM: CT ABDOMEN AND PELVIS WITH CONTRAST TECHNIQUE: Multidetector CT imaging of the abdomen and pelvis was performed using the standard protocol following bolus administration of intravenous contrast. RADIATION DOSE REDUCTION: This exam was performed  according to the departmental dose-optimization program which includes automated exposure control, adjustment of the mA and/or kV according to patient size and/or use of iterative reconstruction technique. CONTRAST:  OMNIPAQUE IOHEXOL 300 MG/ML  SOLN COMPARISON:  CT abdomen and pelvis 10/17/2022 FINDINGS: Lower chest: No acute abnormality. Hepatobiliary: Hepatic steatosis. Unremarkable gallbladder. No biliary dilatation. Pancreas: Unremarkable. Spleen: Unremarkable. Adrenals/Urinary Tract: Unremarkable adrenal glands. No evidence of a renal mass, calculi, or hydronephrosis. Unremarkable bladder. Stomach/Bowel: The stomach is unremarkable. Mild rectal wall thickening is similar to the recent prior CT. There is also mild circumferential wall thickening involving a short segment of the distal ileum, more prominent than on the prior study although comparison is limited by absent distension on the prior study. There is mild left-sided colonic diverticulosis without evidence of acute diverticulitis. The appendix is unremarkable. Vascular/Lymphatic:  Mild abdominal aortic atherosclerosis without aneurysm. No enlarged lymph nodes. The previously noted left external iliac lymph node is subcentimeter in short axis. Reproductive: Mildly enlarged prostate. Other: No ascites or pneumoperitoneum. Musculoskeletal: No acute osseous abnormality or suspicious osseous lesion. IMPRESSION: 1. Unchanged mild rectal wall thickening which could reflect inflammatory or infectious proctitis. 2. Mild wall thickening of the distal ileum which could reflect enteritis. 3. Hepatic steatosis. 4. Mild colonic diverticulosis. 5.  Aortic Atherosclerosis (ICD10-I70.0). Electronically Signed   By: Sebastian Ache M.D.   On: 10/19/2022 13:35   CT ABDOMEN PELVIS W CONTRAST  Addendum Date: 10/17/2022   ADDENDUM REPORT: 10/17/2022 22:51 ADDENDUM: Correction to IMPRESSION: Minimal diverticulosis at the rectosigmoid colon without evidence of diverticulitis. Electronically Signed   By: Thornell Sartorius M.D.   On: 10/17/2022 22:51   Result Date: 10/17/2022 CLINICAL DATA:  Pain in rectum. History of rectal cancer with radiation treatment yesterday. EXAM: CT ABDOMEN AND PELVIS WITH CONTRAST TECHNIQUE: Multidetector CT imaging of the abdomen and pelvis was performed using the standard protocol following bolus administration of intravenous contrast. RADIATION DOSE REDUCTION: This exam was performed according to the departmental dose-optimization program which includes automated exposure control, adjustment of the mA and/or kV according to patient size and/or use of iterative reconstruction technique. CONTRAST:  OMNIPAQUE IOHEXOL 300 MG/ML  SOLN COMPARISON:  01/08/2022, 07/01/2022. FINDINGS: Lower chest: No acute abnormality. Hepatobiliary: No focal liver abnormality is seen. There is fatty infiltration of the liver. No gallstones, gallbladder wall thickening, or biliary dilatation. Pancreas: Unremarkable. No pancreatic ductal dilatation or surrounding inflammatory changes. Spleen: Normal in size  without focal abnormality. Adrenals/Urinary Tract: The adrenal glands are within normal limits. The kidneys enhance symmetrically. No renal calculus or hydronephrosis. The bladder is unremarkable. Stomach/Bowel: The bladder is within normal limits. No obstruction, free air, or pneumatosis. Appendix appears normal. Few scattered diverticula are present along the sigmoid colon without evidence of diverticulitis. There is mild bowel wall thickening at the rectosigmoid colon with minimal surrounding fat stranding. Vascular/Lymphatic: Aortic atherosclerosis. A 1.2 cm lymph node is noted along the external iliac chain on the left, decreased in size from the prior exam. Reproductive: Prostate gland is mildly enlarged. Other: No abdominopelvic ascites. There is a small fat containing umbilical hernia. Musculoskeletal: Mild degenerative changes are present in the thoracolumbar spine. No acute or suspicious osseous abnormality. IMPRESSION: 1. Mild thickening of the walls of the rectosigmoid colon with minimal fat stranding, possible infectious or inflammatory colitis. The possibility of residual malignancy can not be assessed on this modality. 2. Prominent lymph node along the external iliac chain on the left, decreased in size from the prior exam. 3. Mild diverticulitis  at the rectosigmoid colon without evidence of diverticulitis. 4. Hepatic steatosis. 5. Aortic atherosclerosis. Electronically Signed: By: Thornell Sartorius M.D. On: 10/17/2022 22:15      Final Assessment and Plan:   This is a 47 year old male who presents to the ED with abdominal cramping and constipation.  Patient currently undergoing radiation for rectal adenocarcinoma.  He has completed chemotherapy.  States that he is not taking any oral or chemotherapy at home.  Patient nontoxic-appearing on exam.  Tachycardic on arrival but this resolved following fluid bolus.  Pain well-controlled in the ED.  No leukocytosis, no significant electrolyte disturbance.  On  CT scan, patient does have findings consistent with infectious versus inflammatory proctitis.  These findings are similar to previous CT scan 2 days ago.  Patient's oncologist called to discuss the case with attending physician who cosigned this note and he recommended Augmentin twice daily for 7 days, MiraLAX, and follow-up later this week.  First dose of Augmentin given in the ED.  Patient does not appear severely constipated on imaging so we will give small amount of pain medication for home.  Patient instructed to use this judiciously as it can worsen constipation.  He was agreeable with this plan.  Strict ED return precautions given, all questions answered, and stable for discharge.   Clinical Impression:  1. Proctitis   2. Slow transit constipation      Discharge           Final Clinical Impression(s) / ED Diagnoses Final diagnoses:  Proctitis  Slow transit constipation    Rx / DC Orders ED Discharge Orders          Ordered    amoxicillin-clavulanate (AUGMENTIN) 875-125 MG tablet  Every 12 hours        10/19/22 1430    oxyCODONE (ROXICODONE) 5 MG immediate release tablet  Every 6 hours PRN        10/19/22 1430    ondansetron (ZOFRAN) 4 MG tablet  Every 8 hours PRN        10/19/22 1430    polyethylene glycol (MIRALAX) 17 g packet  Daily        10/19/22 1430              Tonette Lederer, PA-C 10/19/22 1435    Pricilla Loveless, MD 10/20/22 [phone]

## 2022-10-19 NOTE — ED Triage Notes (Signed)
Pt states, "I was getting a radiation treatment today and told them that I could not have a bowel movement. My last one was last Friday. They called my cancer doctor about it and he told me to come here to be admitted." Pt denies fever & chills, A&O x4

## 2022-10-19 NOTE — ED Provider Notes (Signed)
[place] EMERGENCY DEPARTMENT AT [facility] Provider Note   CSN: [id number] Arrival date & time: 10/17/22  2030     History  Chief Complaint  Patient presents with   Rectal Pain   Abdominal Pain    Raymond Malone is a 47 y.o. male with history including type 2 diabetes [place], hypertension, GERD, SVT who currently has active rectal cancer, undergoing radiation treatment with his most recent treatment yesterday, stating that after he returned home he started having pain in his rectum.  He states he feels constipated but is actually passing liquid stools.  His last normal bowel movement was 3 days ago, but now has continued pressure at his rectum and has been passing nonbloody but liquid stools.  He used laxatives around 3 PM today and then completed an enema around 6 PM without any improvement in his symptoms.  He denies nausea or vomiting, fevers or chills.  He does have some left lower abdominal cramping, minimally, reporting the majority of his pain is localized to his rectal area.  He has found no alleviators for symptoms.  The history is provided by the patient.       Home Medications Prior to Admission medications   Medication Sig Start Date End Date Taking? Authorizing Provider  lidocaine (XYLOCAINE) 2 % solution Apply 5 mL to rectal area using cotton ball every 3 hours prn rectal pain 10/17/22  Yes Adeel Guiffre, Raynelle Fanning, PA-C  Accu-Chek Softclix Lancets lancets 1 each by Other route 2 (two) times daily. Use as instructed 12/09/21   Roma Kayser, MD  amoxicillin-clavulanate (AUGMENTIN) 875-125 MG tablet Take 1 tablet by mouth every 12 (twelve) hours for 7 days. 10/19/22 10/26/22  Gowens, Mariah L, PA-C  blood glucose meter kit and supplies Dispense based on patient and insurance preference. Use up to four times daily as directed. (FOR ICD-10 E10.9, E11.9). 02/10/21   Heather Roberts, NP  Blood Glucose Monitoring Suppl (ACCU-CHEK GUIDE ME) w/Device KIT 1 Piece by Does not  apply route as directed. 12/09/21   Roma Kayser, MD  capecitabine (XELODA) 500 MG tablet Take 4 tablets (2,000 mg total) by mouth 2 (two) times daily after a meal. Take Monday-Friday. Take only on days of radiation. 09/03/22   Doreatha Massed, MD  empagliflozin (JARDIANCE) 10 MG TABS tablet Take 1 tablet (10 mg total) by mouth daily before breakfast. 09/17/22   Nida, Denman George, MD  gabapentin (NEURONTIN) 300 MG capsule Take 1 capsule (300 mg total) by mouth 3 (three) times daily. 09/18/22   Doreatha Massed, MD  glucose blood (ACCU-CHEK GUIDE) test strip Use to test glucose 2 times a day. 12/09/21   Roma Kayser, MD  Insulin Pen Needle (PEN NEEDLES) 31G X 5 MM MISC 1 each by Does not apply route in the morning, at noon, in the evening, and at bedtime. 09/22/21   Gilmore Laroche, FNP  lidocaine-prilocaine (EMLA) cream Apply a small amount to port a cath site and cover with plastic wrap 1 hour prior to infusion appointments 02/10/22   Doreatha Massed, MD  lisinopril-hydrochlorothiazide (ZESTORETIC) 20-25 MG tablet Take 1 tablet by mouth daily. 03/27/22   Gilmore Laroche, FNP  meloxicam (MOBIC) 7.5 MG tablet Take 1 tablet (7.5 mg total) by mouth daily. 02/12/22   Vickki Hearing, MD  metFORMIN (GLUCOPHAGE) 500 MG tablet Take 1 tablet (500 mg total) by mouth 2 (two) times daily with a meal. 09/17/22   Nida, Denman George, MD  nystatin (MYCOSTATIN) 100000 UNIT/ML  suspension Take 5 mLs (500,000 Units total) by mouth 4 (four) times daily. 05/18/22   Doreatha Massed, MD  ondansetron (ZOFRAN) 4 MG tablet Take 1 tablet (4 mg total) by mouth every 8 (eight) hours as needed for up to 3 days for nausea or vomiting. 10/19/22 10/22/22  Gowens, Mariah L, PA-C  oxyCODONE (ROXICODONE) 5 MG immediate release tablet Take 1 tablet (5 mg total) by mouth every 6 (six) hours as needed for up to 3 days for severe pain or breakthrough pain. 10/19/22 10/22/22  Gowens, Mariah L, PA-C  pantoprazole  (PROTONIX) 40 MG tablet Take 1 tablet (40 mg total) by mouth daily. 03/27/22   Gilmore Laroche, FNP  polyethylene glycol (MIRALAX) 17 g packet Take 17 g by mouth daily for 5 days. 10/19/22 10/24/22  Gowens, Mariah L, PA-C  potassium chloride SA (KLOR-CON M) 20 MEQ tablet Take 1 tablet (20 mEq total) by mouth 2 (two) times daily. 05/18/22   Doreatha Massed, MD  prochlorperazine (COMPAZINE) 10 MG tablet Take 1 tablet (10 mg total) by mouth every 6 (six) hours as needed for nausea or vomiting. 09/03/22   Doreatha Massed, MD  simvastatin (ZOCOR) 40 MG tablet TAKE 1 TABLET BY MOUTH ONCE DAILY IN THE EVENING 09/24/22   Roma Kayser, MD      Allergies    Patient has no known allergies.    Review of Systems   Review of Systems  Constitutional:  Negative for fever.  HENT:  Negative for congestion and sore throat.   Eyes: Negative.   Respiratory:  Negative for chest tightness and shortness of breath.   Cardiovascular:  Negative for chest pain.  Gastrointestinal:  Positive for abdominal pain, diarrhea and rectal pain. Negative for anal bleeding, blood in stool, nausea and vomiting.  Genitourinary: Negative.   Musculoskeletal:  Negative for arthralgias, joint swelling and neck pain.  Skin: Negative.  Negative for rash and wound.  Neurological:  Negative for dizziness, weakness, light-headedness, numbness and headaches.  Psychiatric/Behavioral: Negative.    All other systems reviewed and are negative.   Physical Exam Updated Vital Signs BP 123/77 (BP Location: Right Arm)   Pulse 90   Temp 98.7 F (37.1 C) (Oral)   Resp 15   Ht 5\' 11"  (1.803 m)   Wt 102.1 kg   SpO2 98%   BMI 31.38 kg/m  Physical Exam Vitals and nursing note reviewed. Exam conducted with a chaperone present.  Constitutional:      Appearance: He is well-developed.  HENT:     Head: Normocephalic and atraumatic.  Eyes:     Conjunctiva/sclera: Conjunctivae normal.  Cardiovascular:     Rate and Rhythm: Normal  rate and regular rhythm.     Heart sounds: Normal heart sounds.  Pulmonary:     Effort: Pulmonary effort is normal.     Breath sounds: Normal breath sounds. No wheezing.  Abdominal:     General: Bowel sounds are normal.     Palpations: Abdomen is soft.     Tenderness: There is abdominal tenderness in the left lower quadrant. There is no guarding or rebound.  Genitourinary:    Rectum: Guaiac result negative. Tenderness present. No mass, anal fissure, external hemorrhoid or internal hemorrhoid.  Musculoskeletal:        General: Normal range of motion.     Cervical back: Normal range of motion.  Skin:    General: Skin is warm and dry.  Neurological:     Mental Status: He is alert.  ED Results / Procedures / Treatments   Labs (all labs ordered are listed, but only abnormal results are displayed) Labs Reviewed  COMPREHENSIVE METABOLIC PANEL - Abnormal; Notable for the following components:      Result Value   CO2 20 (*)    Glucose, Bld 192 (*)    All other components within normal limits  URINALYSIS, ROUTINE W REFLEX MICROSCOPIC - Abnormal; Notable for the following components:   Color, Urine STRAW (*)    Specific Gravity, Urine 1.033 (*)    Glucose, UA >=500 (*)    All other components within normal limits  LIPASE, BLOOD  CBC  POC OCCULT BLOOD, ED    EKG None  Radiology CT ABDOMEN PELVIS W CONTRAST  Result Date: 10/19/2022 CLINICAL DATA:  Bowel obstruction suspected. Rectal pain. History of rectal cancer undergoing radiation therapy. EXAM: CT ABDOMEN AND PELVIS WITH CONTRAST TECHNIQUE: Multidetector CT imaging of the abdomen and pelvis was performed using the standard protocol following bolus administration of intravenous contrast. RADIATION DOSE REDUCTION: This exam was performed according to the departmental dose-optimization program which includes automated exposure control, adjustment of the mA and/or kV according to patient size and/or use of iterative reconstruction  technique. CONTRAST:  OMNIPAQUE IOHEXOL 300 MG/ML  SOLN COMPARISON:  CT abdomen and pelvis 10/17/2022 FINDINGS: Lower chest: No acute abnormality. Hepatobiliary: Hepatic steatosis. Unremarkable gallbladder. No biliary dilatation. Pancreas: Unremarkable. Spleen: Unremarkable. Adrenals/Urinary Tract: Unremarkable adrenal glands. No evidence of a renal mass, calculi, or hydronephrosis. Unremarkable bladder. Stomach/Bowel: The stomach is unremarkable. Mild rectal wall thickening is similar to the recent prior CT. There is also mild circumferential wall thickening involving a short segment of the distal ileum, more prominent than on the prior study although comparison is limited by absent distension on the prior study. There is mild left-sided colonic diverticulosis without evidence of acute diverticulitis. The appendix is unremarkable. Vascular/Lymphatic: Mild abdominal aortic atherosclerosis without aneurysm. No enlarged lymph nodes. The previously noted left external iliac lymph node is subcentimeter in short axis. Reproductive: Mildly enlarged prostate. Other: No ascites or pneumoperitoneum. Musculoskeletal: No acute osseous abnormality or suspicious osseous lesion. IMPRESSION: 1. Unchanged mild rectal wall thickening which could reflect inflammatory or infectious proctitis. 2. Mild wall thickening of the distal ileum which could reflect enteritis. 3. Hepatic steatosis. 4. Mild colonic diverticulosis. 5.  Aortic Atherosclerosis (ICD10-I70.0). Electronically Signed   By: Sebastian Ache M.D.   On: 10/19/2022 13:35    Procedures Procedures    Medications Ordered in ED Medications  iohexol (OMNIPAQUE) 300 MG/ML solution 100 mL (100 mLs Intravenous Contrast Given 10/17/22 2148)  morphine (PF) 4 MG/ML injection 4 mg (4 mg Intravenous Given 10/17/22 2237)  ondansetron (ZOFRAN) injection 4 mg (4 mg Intravenous Given 10/17/22 2237)  lidocaine (XYLOCAINE) 2 % viscous mouth solution 15 mL (15 mLs Mouth/Throat Given  10/17/22 2324)    ED Course/ Medical Decision Making/ A&P                             Medical Decision Making Patient presenting with localizing rectal pain along with some left lower pelvic pain, actively receiving radiation for rectal cancer, last treatment was yesterday.  Differential diagnosis including localized proctitis, constipation/fecal impaction, diverticulitis, large or small bowel obstruction, mesenteric ischemia, most likely a localizing proctitis given his currently active radiation therapy.  Amount and/or Complexity of Data Reviewed Labs: ordered.    Details: Significant labs, he is Hemoccult negative, urinalysis is normal, c-Met  is relatively normal, he does have a glucose of 192, no anion gap, normal lipase and normal WBC count at 5.5. Radiology: ordered.    Details: CT abdomen and pelvis revealed a mild rectal wall thickening which is unchanged from prior imaging, probably reflecting inflammatory proctitis.  He also has mild wall thickening in the distal ileum which could reflect enteritis.  This would explain diarrhea.  No significant constipation or impaction, no diverticulitis, however he does have some diverticulosis.  Risk Prescription drug management. Risk Details: Patient was given a treatment with viscous lidocaine at his perirectal area which completely eliminated his rectal pain.  He is given prescription for this topical medication for home use.  Plan follow-up with his oncologist which he sees in 2 days.           Final Clinical Impression(s) / ED Diagnoses Final diagnoses:  Rectal pain    Rx / DC Orders ED Discharge Orders          Ordered    lidocaine (XYLOCAINE) 2 % solution        10/17/22 2317              Burgess Amor, PA-C 10/19/22 2225    Bethann Berkshire, MD 10/20/22 1248

## 2022-10-19 NOTE — Discharge Instructions (Signed)
Thank you for letting us take care of you today.  We see findings on your CT scan that may be related to inflammation or infection, a condition called proctitis. We spoke with your oncologist and given this we will treat it with antibiotics. Please take the antibiotic as prescribed in addition to Miralax daily. We do not see large amounts of stool on your scan so we do think it is ok to take pain medication prudently but recommend only taking this for severe pain as it can worsen constipation. Follow up with your oncologist this week for re-evaluation. For any new or worsening symptoms, return to nearest ED for re-evaluation.

## 2022-10-19 NOTE — Transitions of Care (Post Inpatient/ED Visit) (Signed)
10/19/2022  Name: Raymond Malone MRN: [id number] DOB: Jul 31, 1975  Today's TOC FU Call Status: Today's TOC FU Call Status:: Successful TOC FU Call Competed TOC FU Call Complete Date: 10/19/22  Transition Care Management Follow-up Telephone Call Date of Discharge: 10/17/22 [facility] Type of Discharge: Emergency Department Reason for ED Visit: Other: (Proctitis) How have you been since you were released from the hospital?: Better Any questions or concerns?: No  Items Reviewed: Did you receive and understand the discharge instructions provided?: Yes Medications obtained,verified, and reconciled?: Yes (Medications Reviewed) Any new allergies since your discharge?: No Dietary orders reviewed?: NA Do you have support at home?: Yes  Medications Reviewed Today: Medications Reviewed Today     Reviewed by Merleen Nicely, LPN (Licensed Practical Nurse) on 10/19/22 at 1435  Med List Status: <None>   Medication Order Taking? Sig Documenting Provider Last Dose Status Informant  Accu-Chek Softclix Lancets lancets [id number] Yes 1 each by Other route 2 (two) times daily. Use as instructed Roma Kayser, MD Taking Active Self  amoxicillin-clavulanate (AUGMENTIN) 875-125 MG tablet [id number] Yes Take 1 tablet by mouth every 12 (twelve) hours for 7 days. Tonette Lederer, PA-C Taking Active   blood glucose meter kit and supplies [id number] Yes Dispense based on patient and insurance preference. Use up to four times daily as directed. (FOR ICD-10 E10.9, E11.9). Heather Roberts, NP Taking Active Self  Blood Glucose Monitoring Suppl (ACCU-CHEK GUIDE ME) w/Device KIT [id number] Yes 1 Piece by Does not apply route as directed. Roma Kayser, MD Taking Active Self  capecitabine (XELODA) 500 MG tablet [id number] Yes Take 4 tablets (2,000 mg total) by mouth 2 (two) times daily after a meal. Take Monday-Friday. Take only on days of radiation. Doreatha Massed, MD  Taking Active   empagliflozin (JARDIANCE) 10 MG TABS tablet [id number] Yes Take 1 tablet (10 mg total) by mouth daily before breakfast. Roma Kayser, MD Taking Active   gabapentin (NEURONTIN) 300 MG capsule [id number] Yes Take 1 capsule (300 mg total) by mouth 3 (three) times daily. Doreatha Massed, MD Taking Active   glucose blood (ACCU-CHEK GUIDE) test strip [id number] Yes Use to test glucose 2 times a day. Roma Kayser, MD Taking Active Self  Insulin Pen Needle (PEN NEEDLES) 31G X 5 MM MISC [id number] Yes 1 each by Does not apply route in the morning, at noon, in the evening, and at bedtime. Gilmore Laroche, FNP Taking Active Self  lidocaine (XYLOCAINE) 2 % solution [id number] Yes Apply 5 mL to rectal area using cotton ball every 3 hours prn rectal pain Idol, Raynelle Fanning, PA-C Taking Active   lidocaine-prilocaine (EMLA) cream [id number] Yes Apply a small amount to port a cath site and cover with plastic wrap 1 hour prior to infusion appointments Doreatha Massed, MD Taking Active Self  lisinopril-hydrochlorothiazide (ZESTORETIC) 20-25 MG tablet [id number] Yes Take 1 tablet by mouth daily. Gilmore Laroche, FNP Taking Active Self  meloxicam (MOBIC) 7.5 MG tablet [id number] Yes Take 1 tablet (7.5 mg total) by mouth daily. Vickki Hearing, MD Taking Active Self  metFORMIN (GLUCOPHAGE) 500 MG tablet [id number] Yes Take 1 tablet (500 mg total) by mouth 2 (two) times daily with a meal. Nida, Denman George, MD Taking Active   nystatin (MYCOSTATIN) 100000 UNIT/ML suspension [id number] Yes Take 5 mLs (500,000 Units total) by mouth 4 (four) times daily. Doreatha Massed, MD Taking Active Self  ondansetron [facility]) 4 MG tablet [id number] Yes Take 1 tablet (4 mg total) by  mouth every 8 (eight) hours as needed for up to 3 days for nausea or vomiting. Gowens, Mariah L, PA-C Taking Active   oxyCODONE (ROXICODONE) 5 MG immediate release tablet [id number] Yes Take 1 tablet (5 mg total) by mouth every  6 (six) hours as needed for up to 3 days for severe pain or breakthrough pain. Gowens, Mariah L, PA-C Taking Active   pantoprazole (PROTONIX) 40 MG tablet [id number] Yes Take 1 tablet (40 mg total) by mouth daily. Gilmore Laroche, FNP Taking Active Self  polyethylene glycol (MIRALAX) 17 g packet [id number] Yes Take 17 g by mouth daily for 5 days. Gowens, Lawrence Marse[place], PA-C Taking Active   potassium chloride SA (KLOR-CON M) 20 MEQ tablet [id number] Yes Take 1 tablet (20 mEq total) by mouth 2 (two) times daily. Doreatha Massed, MD Taking Active Self           Med Note Tiburcio Pea, Jackie Plum Jul 15, 2022  9:50 AM) On hold waiting for MD authorization on refill  prochlorperazine (COMPAZINE) 10 MG tablet [id number] Yes Take 1 tablet (10 mg total) by mouth every 6 (six) hours as needed for nausea or vomiting. Doreatha Massed, MD Taking Active   simvastatin (ZOCOR) 40 MG tablet [id number] Yes TAKE 1 TABLET BY MOUTH ONCE DAILY IN THE EVENING Nida, Denman George, MD Taking Active             Home Care and Equipment/Supplies: Were Home Health Services Ordered?: No Any new equipment or medical supplies ordered?: No  Functional Questionnaire: Do you need assistance with bathing/showering or dressing?: No Do you need assistance with meal preparation?: No Do you need assistance with eating?: No Do you have difficulty maintaining continence: No Do you need assistance with getting out of bed/getting out of a chair/moving?: No Do you have difficulty managing or taking your medications?: No  Follow up appointments reviewed: PCP Follow-up appointment confirmed?: No MD Provider Line Number:[phone] Given: Yes Specialist Hospital Follow-up appointment confirmed?: Yes Date of Specialist follow-up appointment?: 10/21/22 Follow-Up Specialty Provider:: oncology Do you need transportation to your follow-up appointment?: No Do you understand care options if your condition(s) worsen?: Yes-patient  verbalized understanding    SIGNATURE  Woodfin Ganja LPN [facility] Nurse Health Advisor Direct Dial [phone]

## 2022-10-20 ENCOUNTER — Telehealth: Payer: Self-pay

## 2022-10-21 ENCOUNTER — Inpatient Hospital Stay (HOSPITAL_BASED_OUTPATIENT_CLINIC_OR_DEPARTMENT_OTHER): Payer: 59 | Admitting: Hematology

## 2022-10-21 ENCOUNTER — Encounter: Payer: Self-pay | Admitting: Hematology

## 2022-10-21 ENCOUNTER — Inpatient Hospital Stay: Payer: 59 | Attending: Hematology

## 2022-10-21 DIAGNOSIS — C20 Malignant neoplasm of rectum: Secondary | ICD-10-CM | POA: Insufficient documentation

## 2022-10-21 LAB — CBC WITH DIFFERENTIAL/PLATELET
Abs Immature Granulocytes: 0.01 10*3/uL (ref 0.00–0.07)
Basophils Absolute: 0 10*3/uL (ref 0.0–0.1)
Basophils Relative: 1 %
Eosinophils Absolute: 0.2 10*3/uL (ref 0.0–0.5)
Eosinophils Relative: 3 %
HCT: 39.6 % (ref 39.0–52.0)
Hemoglobin: 14.3 g/dL (ref 13.0–17.0)
Immature Granulocytes: 0 %
Lymphocytes Relative: 11 %
Lymphs Abs: 0.5 10*3/uL — ABNORMAL LOW (ref 0.7–4.0)
MCH: 30.9 pg (ref 26.0–34.0)
MCHC: 36.1 g/dL — ABNORMAL HIGH (ref 30.0–36.0)
MCV: 85.5 fL (ref 80.0–100.0)
Monocytes Absolute: 0.7 10*3/uL (ref 0.1–1.0)
Monocytes Relative: 17 %
Neutro Abs: 3 10*3/uL (ref 1.7–7.7)
Neutrophils Relative %: 68 %
Platelets: 239 10*3/uL (ref 150–400)
RBC: 4.63 MIL/uL (ref 4.22–5.81)
RDW: 13.6 % (ref 11.5–15.5)
WBC: 4.4 10*3/uL (ref 4.0–10.5)
nRBC: 0 % (ref 0.0–0.2)

## 2022-10-21 LAB — COMPREHENSIVE METABOLIC PANEL
ALT: 25 U/L (ref 0–44)
AST: 20 U/L (ref 15–41)
Albumin: 4.3 g/dL (ref 3.5–5.0)
Alkaline Phosphatase: 65 U/L (ref 38–126)
Anion gap: 11 (ref 5–15)
BUN: 9 mg/dL (ref 6–20)
CO2: 23 mmol/L (ref 22–32)
Calcium: 8.8 mg/dL — ABNORMAL LOW (ref 8.9–10.3)
Chloride: 99 mmol/L (ref 98–111)
Creatinine, Ser: 0.86 mg/dL (ref 0.61–1.24)
GFR, Estimated: >60 mL/min (ref >60)
Glucose, Bld: 214 mg/dL — ABNORMAL HIGH (ref 70–99)
Potassium: 2.7 mmol/L — CL (ref 3.5–5.1)
Sodium: 133 mmol/L — ABNORMAL LOW (ref 135–145)
Total Bilirubin: 0.9 mg/dL (ref 0.3–1.2)
Total Protein: 7.3 g/dL (ref 6.5–8.1)

## 2022-10-21 LAB — MAGNESIUM: Magnesium: 2.4 mg/dL (ref 1.7–2.4)

## 2022-10-21 MED ORDER — SUCRALFATE 1 G PO TABS: ORAL_TABLET | ORAL | 0 refills | Status: DC

## 2022-10-21 MED ORDER — POTASSIUM CHLORIDE CRYS ER 20 MEQ PO TBCR: 20.0000 meq | EXTENDED_RELEASE_TABLET | Freq: Two times a day (BID) | ORAL | 6 refills | Status: DC

## 2022-10-21 NOTE — Progress Notes (Signed)
Patient request labs be drawn from arm. Labs drawn and sent to lab for office visit.

## 2022-10-21 NOTE — Patient Instructions (Addendum)
[facility] - [facility]  Discharge Instructions  You were seen and examined today by Dr. Ellin Saba.  Follow-up as scheduled.    Thank you for choosing [facility] to provide your oncology and hematology care.   To afford each patient quality time with our provider, please arrive at least 15 minutes before your scheduled appointment time. You may need to reschedule your appointment if you arrive late (10 or more minutes). Arriving late affects you and other patients whose appointments are after yours.  Also, if you miss three or more appointments without notifying the office, you may be dismissed from the clinic at the provider's discretion.    Again, thank you for choosing [facility].  Our hope is that these requests will decrease the amount of time that you wait before being seen by our physicians.   If you have a lab appointment with the Cancer Center - please note that after April 8th, all labs will be drawn in the cancer center.  You do not have to check in or register with the main entrance as you have in the past but will complete your check-in at the cancer center.            _____________________________________________________________  Should you have questions after your visit to [facility], please contact our office at [phone] and follow the prompts.  Our office hours are 8:00 a.m. to 4:30 p.m. Monday - Thursday and 8:00 a.m. to 2:30 p.m. Friday.  Please note that voicemails left after 4:00 p.m. may not be returned until the following business day.  We are closed weekends and all major holidays.  You do have access to a nurse 24-7, just call the main number to the clinic [phone] and do not press any options, hold on the line and a nurse will answer the phone.    For prescription refill requests, have your pharmacy contact our office and allow 72 hours.    Masks are no longer required in the cancer  centers. If you would like for your care team to wear a mask while they are taking care of you, please let them know. You may have one support person who is at least 46 years old accompany you for your appointments.
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
## 2022-10-21 NOTE — Progress Notes (Signed)
CRITICAL VALUE STICKER  CRITICAL VALUE: Potassium 2.7  DATE & TIME NOTIFIED: 10/21/22 @ 16:15  MESSENGER (representative from lab): Pincus Badder  MD NOTIFIED: Dr. Ellin Saba   TIME OF NOTIFICATION: 10/21/22 @ 6:15  RESPONSE: Dr. Ellin Saba wants the patient to take Potassium 20 meq take two tablets now, two tablets at bedtime and two tablets in the morning. Refill of potassium sent to the pharmacy. Patient aware  to take 2 pills when he picks it up, 2 pills before bed and 2 pills tomorrow morning. He is agreeable with plan but will call us back if the potassium is too expensive.

## 2022-10-22 ENCOUNTER — Other Ambulatory Visit: Payer: Self-pay | Admitting: *Deleted

## 2022-10-22 MED ORDER — HYDROCORT-PRAMOXINE (PERIANAL) 1-1 % EX FOAM: 1.0000 | Freq: Two times a day (BID) | CUTANEOUS | 3 refills | Status: DC

## 2022-10-22 NOTE — Transitions of Care (Post Inpatient/ED Visit) (Signed)
   10/22/2022  Name: Raymond Malone MRN: [id number] DOB: March 22, 1976  Today's TOC FU Call Status: Today's TOC FU Call Status:: Unsuccessful Call (2nd Attempt) Unsuccessful Call (1st Attempt) Date: 10/19/22 Unsuccessful Call (2nd Attempt) Date: 10/21/22 Unsuccessful Call (3rd Attempt) Date: 10/22/22  Attempted to reach the patient regarding the most recent Inpatient/ED visit.  Follow Up Plan: No further outreach attempts will be made at this time. We have been unable to contact the patient.  Signature  Fredirick Maudlin

## 2022-10-22 NOTE — Telephone Encounter (Signed)
Per Dr. Landry Corporal, patient instructed to use Carafate 2 g in 50-100 ML of warm water as a fleets enema. Also use Proctofoam alternating with Carafate. Patient made aware and verbalized understanding of instructions and is familiar with fleets enema use.  Stressed importance of being diligent with Glucerna intake.

## 2022-10-25 ENCOUNTER — Encounter: Payer: Self-pay | Admitting: Hematology

## 2022-10-28 ENCOUNTER — Encounter: Payer: Self-pay | Admitting: Hematology

## 2022-10-28 ENCOUNTER — Inpatient Hospital Stay: Payer: 59

## 2022-10-28 ENCOUNTER — Inpatient Hospital Stay: Payer: 59 | Admitting: Hematology

## 2022-11-16 ENCOUNTER — Telehealth: Payer: Self-pay | Admitting: "Endocrinology

## 2022-11-16 MED ORDER — EMPAGLIFLOZIN 10 MG PO TABS: 10.0000 mg | ORAL_TABLET | Freq: Every day | ORAL | 1 refills | Status: DC

## 2022-11-16 NOTE — Telephone Encounter (Signed)
Rx Sent  

## 2022-11-16 NOTE — Telephone Encounter (Signed)
Pt is asking for a refill on his Jardiance to [facility]

## 2022-11-18 ENCOUNTER — Encounter: Payer: Self-pay | Admitting: Hematology

## 2022-11-19 ENCOUNTER — Other Ambulatory Visit: Payer: Self-pay

## 2022-11-19 DIAGNOSIS — E1165 Type 2 diabetes mellitus with hyperglycemia: Secondary | ICD-10-CM

## 2022-11-19 MED ORDER — EMPAGLIFLOZIN 10 MG PO TABS
20.0000 mg | ORAL_TABLET | Freq: Every day | ORAL | 0 refills | Status: DC
Start: 2022-11-19 — End: 2023-07-01

## 2022-11-20 ENCOUNTER — Encounter: Payer: Self-pay | Admitting: Hematology

## 2022-11-23 ENCOUNTER — Telehealth (INDEPENDENT_AMBULATORY_CARE_PROVIDER_SITE_OTHER): Payer: Self-pay | Admitting: Gastroenterology

## 2022-11-23 ENCOUNTER — Other Ambulatory Visit (HOSPITAL_COMMUNITY): Payer: Self-pay | Admitting: Surgery

## 2022-11-23 DIAGNOSIS — C20 Malignant neoplasm of rectum: Secondary | ICD-10-CM

## 2022-11-23 NOTE — Telephone Encounter (Signed)
Dolores Frame, MD  Andria Meuse, MD; Marlowe Shores, LPN Cc: Doreatha Massed, MD Hyacinth Meeker, Sure thing.  Kenney Houseman, can you please schedule a flexible sigmoidoscopy in 6 weeks ? Dx: rectal cancer. Room: 1  Thanks,  Katrinka Blazing, MD Gastroenterology and Hepatology [facility]       Previous Messages    ----- Message ----- From: Andria Meuse, MD Sent: 11/23/2022  10:23 AM EDT To: Doreatha Massed, MD; * Subject: Follow-up rectal cancer                        Hey Reuel Boom -  Would you be able to schedule for repeat flex sig in next month or two to assess response? Previously had excellent clinical response to chemo. Now completed chemo/XRT 11/16/22. Remains highly motivated to forego any surgery if at all possible.  Thayer Ohm

## 2022-11-25 ENCOUNTER — Encounter: Payer: Self-pay | Admitting: Hematology

## 2022-11-30 ENCOUNTER — Telehealth (INDEPENDENT_AMBULATORY_CARE_PROVIDER_SITE_OTHER): Payer: Self-pay | Admitting: *Deleted

## 2022-11-30 ENCOUNTER — Encounter: Payer: Self-pay | Admitting: Hematology

## 2022-11-30 NOTE — Telephone Encounter (Signed)
He will need a repeat flexible sigmoidoscopy in 4-6 weeks. Any room. Dx: rectal cancer

## 2022-11-30 NOTE — Telephone Encounter (Signed)
Patent is on recall for 1 yr TCS - you did a flex sig 07/16/22 and had in op note to do flex sig 6 mths after surgery - I see office notes and radiation notes in care everywhere but I didn't see surgery note unless I over looked it - please advise

## 2022-12-01 ENCOUNTER — Encounter (INDEPENDENT_AMBULATORY_CARE_PROVIDER_SITE_OTHER): Payer: Self-pay | Admitting: *Deleted

## 2022-12-01 ENCOUNTER — Ambulatory Visit (HOSPITAL_COMMUNITY)
Admission: RE | Admit: 2022-12-01 | Discharge: 2022-12-01 | Disposition: A | Discharge status: Home / Self Care | Payer: 59 | Source: Ambulatory Visit | Attending: Surgery | Admitting: Surgery

## 2022-12-01 DIAGNOSIS — C20 Malignant neoplasm of rectum: Secondary | ICD-10-CM | POA: Diagnosis not present

## 2022-12-01 NOTE — Telephone Encounter (Signed)
Questionnaire mailed to patient

## 2022-12-03 ENCOUNTER — Other Ambulatory Visit: Payer: Self-pay | Admitting: *Deleted

## 2022-12-03 DIAGNOSIS — C20 Malignant neoplasm of rectum: Secondary | ICD-10-CM

## 2022-12-03 NOTE — Progress Notes (Signed)
Patient called to see what next steps are for him since finishing chemoradiation on 7/8.  Per Dr. Ellin Saba, will schedule for a CTAP, labs and follow up with him after.

## 2022-12-07 ENCOUNTER — Other Ambulatory Visit: Payer: Self-pay | Admitting: Hematology

## 2022-12-07 ENCOUNTER — Encounter: Payer: Self-pay | Admitting: Hematology

## 2022-12-11 ENCOUNTER — Ambulatory Visit (HOSPITAL_COMMUNITY)
Admission: RE | Admit: 2022-12-11 | Discharge: 2022-12-11 | Disposition: A | Discharge status: Home / Self Care | Payer: 59 | Source: Ambulatory Visit | Attending: Hematology | Admitting: Hematology

## 2022-12-11 ENCOUNTER — Ambulatory Visit: Admission: RE | Admit: 2022-12-11 | Payer: 59 | Source: Ambulatory Visit

## 2022-12-11 ENCOUNTER — Inpatient Hospital Stay: Payer: 59 | Attending: Hematology

## 2022-12-11 DIAGNOSIS — G629 Polyneuropathy, unspecified: Secondary | ICD-10-CM | POA: Insufficient documentation

## 2022-12-11 DIAGNOSIS — E871 Hypo-osmolality and hyponatremia: Secondary | ICD-10-CM | POA: Insufficient documentation

## 2022-12-11 DIAGNOSIS — Z923 Personal history of irradiation: Secondary | ICD-10-CM | POA: Diagnosis not present

## 2022-12-11 DIAGNOSIS — C20 Malignant neoplasm of rectum: Secondary | ICD-10-CM

## 2022-12-11 DIAGNOSIS — Z87891 Personal history of nicotine dependence: Secondary | ICD-10-CM | POA: Insufficient documentation

## 2022-12-11 DIAGNOSIS — K5900 Constipation, unspecified: Secondary | ICD-10-CM | POA: Insufficient documentation

## 2022-12-11 LAB — CBC WITH DIFFERENTIAL/PLATELET
Abs Immature Granulocytes: 0.02 10*3/uL (ref 0.00–0.07)
Basophils Absolute: 0 10*3/uL (ref 0.0–0.1)
Basophils Relative: 1 %
Eosinophils Absolute: 0.2 10*3/uL (ref 0.0–0.5)
Eosinophils Relative: 6 %
HCT: 39 % (ref 39.0–52.0)
Hemoglobin: 13.8 g/dL (ref 13.0–17.0)
Immature Granulocytes: 1 %
Lymphocytes Relative: 15 %
Lymphs Abs: 0.6 10*3/uL — ABNORMAL LOW (ref 0.7–4.0)
MCH: 31.6 pg (ref 26.0–34.0)
MCHC: 35.4 g/dL (ref 30.0–36.0)
MCV: 89.2 fL (ref 80.0–100.0)
Monocytes Absolute: 0.7 10*3/uL (ref 0.1–1.0)
Monocytes Relative: 18 %
Neutro Abs: 2.4 10*3/uL (ref 1.7–7.7)
Neutrophils Relative %: 59 %
Platelets: 310 10*3/uL (ref 150–400)
RBC: 4.37 MIL/uL (ref 4.22–5.81)
RDW: 14 % (ref 11.5–15.5)
WBC: 4 10*3/uL (ref 4.0–10.5)
nRBC: 0 % (ref 0.0–0.2)

## 2022-12-11 LAB — MAGNESIUM: Magnesium: 2.1 mg/dL (ref 1.7–2.4)

## 2022-12-11 LAB — COMPREHENSIVE METABOLIC PANEL
ALT: 37 U/L (ref 0–44)
AST: 26 U/L (ref 15–41)
Albumin: 4.2 g/dL (ref 3.5–5.0)
Alkaline Phosphatase: 74 U/L (ref 38–126)
Anion gap: 8 (ref 5–15)
BUN: 12 mg/dL (ref 6–20)
CO2: 25 mmol/L (ref 22–32)
Calcium: 8.9 mg/dL (ref 8.9–10.3)
Chloride: 98 mmol/L (ref 98–111)
Creatinine, Ser: 0.81 mg/dL (ref 0.61–1.24)
GFR, Estimated: >60 mL/min (ref >60)
Glucose, Bld: 156 mg/dL — ABNORMAL HIGH (ref 70–99)
Potassium: 4.2 mmol/L (ref 3.5–5.1)
Sodium: 131 mmol/L — ABNORMAL LOW (ref 135–145)
Total Bilirubin: 0.6 mg/dL (ref 0.3–1.2)
Total Protein: 7.1 g/dL (ref 6.5–8.1)

## 2022-12-12 ENCOUNTER — Other Ambulatory Visit: Payer: Self-pay

## 2022-12-13 ENCOUNTER — Other Ambulatory Visit: Payer: Self-pay | Admitting: "Endocrinology

## 2022-12-13 DIAGNOSIS — E119 Type 2 diabetes mellitus without complications: Secondary | ICD-10-CM

## 2022-12-13 DIAGNOSIS — E785 Hyperlipidemia, unspecified: Secondary | ICD-10-CM

## 2022-12-16 ENCOUNTER — Encounter: Payer: Self-pay | Admitting: Hematology

## 2022-12-16 NOTE — Telephone Encounter (Signed)
Pt contacted and scheduled for 01/07/23 at 10:15am with Dr.Castaneda. No PA required for [facility] Medicaid or [employer] (contacted [employer] to check for pre cert). Instructions mailed to patient and placed on my chart.

## 2022-12-18 ENCOUNTER — Ambulatory Visit (HOSPITAL_COMMUNITY)
Admission: RE | Admit: 2022-12-18 | Discharge: 2022-12-18 | Disposition: A | Discharge status: Home / Self Care | Payer: 59 | Source: Ambulatory Visit | Attending: Hematology | Admitting: Hematology

## 2022-12-18 ENCOUNTER — Other Ambulatory Visit: Payer: Self-pay

## 2022-12-18 DIAGNOSIS — C20 Malignant neoplasm of rectum: Secondary | ICD-10-CM | POA: Insufficient documentation

## 2022-12-18 MED ORDER — IOHEXOL 300 MG/ML  SOLN
100.0000 mL | Freq: Once | INTRAMUSCULAR | Status: AC | PRN
  Administered 2022-12-18: 100 mL via INTRAVENOUS

## 2022-12-19 ENCOUNTER — Other Ambulatory Visit: Payer: Self-pay | Admitting: "Endocrinology

## 2022-12-19 DIAGNOSIS — Z794 Long term (current) use of insulin: Secondary | ICD-10-CM
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
## 2022-12-21 ENCOUNTER — Inpatient Hospital Stay (HOSPITAL_BASED_OUTPATIENT_CLINIC_OR_DEPARTMENT_OTHER): Payer: 59 | Admitting: Hematology

## 2022-12-21 VITALS — BP 119/83 | HR 115 | Temp 98.6°F | Resp 20 | Wt 217.8 lb

## 2022-12-21 DIAGNOSIS — C20 Malignant neoplasm of rectum: Secondary | ICD-10-CM

## 2022-12-21 NOTE — Patient Instructions (Addendum)
[facility] at [facility] Discharge Instructions   You were seen and examined today by Dr. Ellin Saba.  He reviewed the results of your lab work which are normal/stable.   He reviewed the results of your CT scan which does not show any evidence of cancer recurrence.   We will arrange for you to see Dr. Levon Hedger for a sigmoidoscopy.   We will arrange for an MRI of the pelvis in 3 months.   Return as scheduled.    Thank you for choosing [facility] at [facility] to provide your oncology and hematology care.  To afford each patient quality time with our provider, please arrive at least 15 minutes before your scheduled appointment time.   If you have a lab appointment with the Cancer Center please come in thru the Main Entrance and check in at the main information desk.  You need to re-schedule your appointment should you arrive 10 or more minutes late.  We strive to give you quality time with our providers, and arriving late affects you and other patients whose appointments are after yours.  Also, if you no show three or more times for appointments you may be dismissed from the clinic at the providers discretion.     Again, thank you for choosing [facility].  Our hope is that these requests will decrease the amount of time that you wait before being seen by our physicians.       _____________________________________________________________  Should you have questions after your visit to [facility], please contact our office at [phone] and follow the prompts.  Our office hours are 8:00 a.m. and 4:30 p.m. Monday - Friday.  Please note that voicemails left after 4:00 p.m. may not be returned until the following business day.  We are closed weekends and major holidays.  You do have access to a nurse 24-7, just call the main number to the clinic [phone] and do not press any options, hold on the line and a nurse  will answer the phone.    For prescription refill requests, have your pharmacy contact our office and allow 72 hours.    Due to Covid, you will need to wear a mask upon entering the hospital. If you do not have a mask, a mask will be given to you at the Main Entrance upon arrival. For doctor visits, patients may have 1 support person age 90 or older with them. For treatment visits, patients can not have anyone with them due to social distancing guidelines and our immunocompromised population.

## 2022-12-22 ENCOUNTER — Other Ambulatory Visit: Payer: Self-pay

## 2022-12-23 ENCOUNTER — Ambulatory Visit: Payer: 59 | Admitting: "Endocrinology

## 2022-12-23 ENCOUNTER — Telehealth: Payer: Self-pay | Admitting: "Endocrinology

## 2022-12-23 NOTE — Telephone Encounter (Signed)
error 

## 2022-12-29 ENCOUNTER — Other Ambulatory Visit: Payer: Self-pay | Admitting: Family Medicine

## 2022-12-29 DIAGNOSIS — K219 Gastro-esophageal reflux disease without esophagitis: Secondary | ICD-10-CM

## 2023-01-04 ENCOUNTER — Ambulatory Visit: Payer: 59 | Admitting: Family Medicine

## 2023-01-05 ENCOUNTER — Other Ambulatory Visit: Payer: Self-pay | Admitting: Hematology

## 2023-01-05 ENCOUNTER — Encounter: Payer: Self-pay | Admitting: Family Medicine

## 2023-01-07 ENCOUNTER — Ambulatory Visit (HOSPITAL_COMMUNITY): Payer: Medicaid Other | Admitting: Certified Registered"

## 2023-01-07 ENCOUNTER — Encounter (HOSPITAL_COMMUNITY)
Admission: RE | Disposition: A | Discharge status: Home / Self Care | Payer: Self-pay | Source: Home / Self Care | Attending: Gastroenterology

## 2023-01-07 ENCOUNTER — Other Ambulatory Visit: Payer: Self-pay

## 2023-01-07 ENCOUNTER — Ambulatory Visit (HOSPITAL_COMMUNITY)
Admission: RE | Admit: 2023-01-07 | Discharge: 2023-01-07 | Disposition: A | Discharge status: Home / Self Care | Payer: Medicaid Other | Attending: Gastroenterology | Admitting: Gastroenterology

## 2023-01-07 ENCOUNTER — Encounter (HOSPITAL_COMMUNITY): Payer: Self-pay | Admitting: Gastroenterology

## 2023-01-07 DIAGNOSIS — K649 Unspecified hemorrhoids: Secondary | ICD-10-CM | POA: Diagnosis not present

## 2023-01-07 DIAGNOSIS — Z7984 Long term (current) use of oral hypoglycemic drugs: Secondary | ICD-10-CM | POA: Diagnosis not present

## 2023-01-07 DIAGNOSIS — Z85048 Personal history of other malignant neoplasm of rectum, rectosigmoid junction, and anus: Secondary | ICD-10-CM | POA: Insufficient documentation

## 2023-01-07 DIAGNOSIS — E119 Type 2 diabetes mellitus without complications: Secondary | ICD-10-CM | POA: Insufficient documentation

## 2023-01-07 DIAGNOSIS — Z8614 Personal history of Methicillin resistant Staphylococcus aureus infection: Secondary | ICD-10-CM | POA: Diagnosis not present

## 2023-01-07 DIAGNOSIS — C20 Malignant neoplasm of rectum: Secondary | ICD-10-CM

## 2023-01-07 DIAGNOSIS — Z87891 Personal history of nicotine dependence: Secondary | ICD-10-CM | POA: Diagnosis not present

## 2023-01-07 DIAGNOSIS — Z08 Encounter for follow-up examination after completed treatment for malignant neoplasm: Secondary | ICD-10-CM | POA: Insufficient documentation

## 2023-01-07 DIAGNOSIS — E785 Hyperlipidemia, unspecified: Secondary | ICD-10-CM | POA: Insufficient documentation

## 2023-01-07 DIAGNOSIS — I10 Essential (primary) hypertension: Secondary | ICD-10-CM | POA: Insufficient documentation

## 2023-01-07 DIAGNOSIS — K219 Gastro-esophageal reflux disease without esophagitis: Secondary | ICD-10-CM | POA: Insufficient documentation

## 2023-01-07 DIAGNOSIS — K648 Other hemorrhoids: Secondary | ICD-10-CM | POA: Diagnosis not present

## 2023-01-07 HISTORY — PX: FLEXIBLE SIGMOIDOSCOPY: SHX5431

## 2023-01-07 HISTORY — PX: BIOPSY: SHX5522

## 2023-01-07 LAB — GLUCOSE, CAPILLARY: Glucose-Capillary: 171 mg/dL — ABNORMAL HIGH (ref 70–99)

## 2023-01-07 SURGERY — SIGMOIDOSCOPY, FLEXIBLE
Anesthesia: General

## 2023-01-07 MED ORDER — LIDOCAINE HCL (CARDIAC) PF 100 MG/5ML IV SOSY
PREFILLED_SYRINGE | INTRAVENOUS | Status: DC | PRN
  Administered 2023-01-07: 80 mg via INTRAVENOUS

## 2023-01-07 MED ORDER — LACTATED RINGERS IV SOLN: INTRAVENOUS | Status: DC

## 2023-01-07 MED ORDER — PHENYLEPHRINE 80 MCG/ML (10ML) SYRINGE FOR IV PUSH (FOR BLOOD PRESSURE SUPPORT)
PREFILLED_SYRINGE | INTRAVENOUS | Status: AC
  Filled 2023-01-07: qty 10

## 2023-01-07 MED ORDER — PROPOFOL 10 MG/ML IV BOLUS
INTRAVENOUS | Status: DC | PRN
  Administered 2023-01-07: 100 mg via INTRAVENOUS

## 2023-01-07 MED ORDER — PROPOFOL 500 MG/50ML IV EMUL
INTRAVENOUS | Status: DC | PRN
  Administered 2023-01-07: 150 ug/kg/min via INTRAVENOUS

## 2023-01-07 MED ORDER — LACTATED RINGERS IV SOLN: INTRAVENOUS | Status: DC | PRN

## 2023-01-07 MED ORDER — PROPOFOL 1000 MG/100ML IV EMUL
INTRAVENOUS | Status: AC
  Filled 2023-01-07: qty 100

## 2023-01-07 MED ORDER — EPHEDRINE SULFATE-NACL 50-0.9 MG/10ML-% IV SOSY
PREFILLED_SYRINGE | INTRAVENOUS | Status: DC | PRN
Start: 2023-01-07 — End: 2023-01-07
  Administered 2023-01-07 (×2): 10 mg via INTRAVENOUS

## 2023-01-07 MED ORDER — PHENYLEPHRINE 80 MCG/ML (10ML) SYRINGE FOR IV PUSH (FOR BLOOD PRESSURE SUPPORT)
PREFILLED_SYRINGE | INTRAVENOUS | Status: DC | PRN
  Administered 2023-01-07: 240 ug via INTRAVENOUS
  Administered 2023-01-07 (×2): 160 ug via INTRAVENOUS

## 2023-01-07 NOTE — Discharge Instructions (Signed)
You are being discharged to home.  Resume your previous diet.  We are waiting for your pathology results.  Your physician has recommended a repeat flexible sigmoidoscopy in six months for surveillance.

## 2023-01-07 NOTE — Anesthesia Preprocedure Evaluation (Signed)
Anesthesia Evaluation  Patient identified by MRN, date of birth, ID band Patient awake    Reviewed: Allergy & Precautions, H&P , NPO status , Patient's Chart, lab work & pertinent test results, reviewed documented beta blocker date and time   Airway Mallampati: II  TM Distance: >3 FB Neck ROM: full    Dental no notable dental hx.    Pulmonary neg pulmonary ROS, former smoker   Pulmonary exam normal breath sounds clear to auscultation       Cardiovascular Exercise Tolerance: Good hypertension, negative cardio ROS  Rhythm:regular Rate:Normal     Neuro/Psych negative neurological ROS  negative psych ROS   GI/Hepatic Neg liver ROS,GERD  Medicated,,  Endo/Other  negative endocrine ROSdiabetes, Type 2    Renal/GU negative Renal ROS  negative genitourinary   Musculoskeletal negative musculoskeletal ROS (+)    Abdominal   Peds negative pediatric ROS (+)  Hematology negative hematology ROS (+)   Anesthesia Other Findings   Reproductive/Obstetrics negative OB ROS                             Anesthesia Physical Anesthesia Plan  ASA: 2  Anesthesia Plan: General   Post-op Pain Management:    Induction:   PONV Risk Score and Plan: Propofol infusion  Airway Management Planned:   Additional Equipment:   Intra-op Plan:   Post-operative Plan:   Informed Consent: I have reviewed the patients History and Physical, chart, labs and discussed the procedure including the risks, benefits and alternatives for the proposed anesthesia with the patient or authorized representative who has indicated his/her understanding and acceptance.     Dental Advisory Given  Plan Discussed with: CRNA  Anesthesia Plan Comments:        Anesthesia Quick Evaluation

## 2023-01-07 NOTE — H&P (Addendum)
Raymond Malone is an 47 y.o. male.   Chief Complaint: follow up rectal cancer HPI: 47 y/o M with PMH RECTAL CANCER, GERD, diabetes, hyperlipidemia, hypertension, coming for follow-up of rectal cancer.   The patient denies having any nausea, vomiting, fever, chills, hematochezia, melena, hematemesis, abdominal distention, abdominal pain, diarrhea, jaundice, pruritus or weight loss.  Past Medical History:  Diagnosis Date   Abscess, axilla 10/06/2016   Alcohol dependence (HCC) 10/06/2016   Current smoker 10/06/2016   Dyshidrotic eczema 10/27/2016   GERD (gastroesophageal reflux disease)    Hyperlipidemia    Hyperosmolar non-ketotic state in patient with type 2 diabetes mellitus (HCC) 10/06/2016   Hypertension    MRSA (methicillin resistant Staphylococcus aureus)    Skin abscess    Type 2 diabetes mellitus (HCC)     Past Surgical History:  Procedure Laterality Date   ANKLE SURGERY     BIOPSY  01/06/2022   Procedure: BIOPSY;  Surgeon: Dolores Frame, MD;  [facility];  Service: Gastroenterology;;   BIOPSY  07/16/2022   Procedure: BIOPSY;  Surgeon: Dolores Frame, MD;  [facility];  Service: Gastroenterology;;   COLONOSCOPY WITH PROPOFOL N/A 01/06/2022   Procedure: COLONOSCOPY WITH PROPOFOL;  Surgeon: Dolores Frame, MD;  [facility];  Service: Gastroenterology;  Laterality: N/A;  730   FLEXIBLE SIGMOIDOSCOPY N/A 07/16/2022   Procedure: FLEXIBLE SIGMOIDOSCOPY;  Surgeon: Dolores Frame, MD;  [facility];  Service: Gastroenterology;  Laterality: N/A;  215pm, asa 1-2   HAND SURGERY     boxer's fracture L hand   INCISION AND DRAINAGE PERIRECTAL ABSCESS     IR IMAGING GUIDED PORT INSERTION  02/10/2022   MYRINGOTOMY WITH TUBE PLACEMENT Bilateral 07/22/2020   Procedure: BILATERAL MYRINGOTOMY WITH TUBE PLACEMENT;  Surgeon: Newman Pies, MD;  Location: [facility];  Service: ENT;  Laterality: Bilateral;    POLYPECTOMY  01/06/2022   Procedure: POLYPECTOMY INTESTINAL;  Surgeon: Dolores Frame, MD;  [facility];  Service: Gastroenterology;;   SVT ABLATION N/A 04/16/2021   Procedure: SVT ABLATION;  Surgeon: Marinus Maw, MD;  [facility];  Service: Cardiovascular;  Laterality: N/A;    Family History  Problem Relation Age of Onset   Hypertension Mother    Social History:  reports that he has quit smoking. His smoking use included cigarettes. He started smoking about 28 years ago. He has a 28.7 pack-year smoking history. He has never used smokeless tobacco. He reports that he does not currently use alcohol. He reports that he does not currently use drugs after having used the following drugs: Marijuana.  Allergies: No Known Allergies  Medications Prior to Admission  Medication Sig Dispense Refill   gabapentin (NEURONTIN) 300 MG capsule TAKE 1 CAPSULE BY MOUTH THREE TIMES DAILY 90 capsule 0   lidocaine (XYLOCAINE) 2 % solution Apply 5 mL to rectal area using cotton ball every 3 hours prn rectal pain 100 mL 0   lisinopril-hydrochlorothiazide (ZESTORETIC) 20-25 MG tablet Take 1 tablet by mouth daily. 90 tablet 1   metFORMIN (GLUCOPHAGE) 500 MG tablet Take 1 tablet (500 mg total) by mouth 2 (two) times daily with a meal. 180 tablet 1   simvastatin (ZOCOR) 40 MG tablet TAKE 1 TABLET BY MOUTH ONCE DAILY IN THE EVENING 90 tablet 0   Accu-Chek Softclix Lancets lancets 1 each by Other route 2 (two) times daily. Use as instructed 100 each 2   blood glucose  meter kit and supplies Dispense based on patient and insurance preference. Use up to four times daily as directed. (FOR ICD-10 E10.9, E11.9). 1 each 0   Blood Glucose Monitoring Suppl (ACCU-CHEK GUIDE ME) w/Device KIT Use to test BG bid. E11.65 1 kit 0   empagliflozin (JARDIANCE) 10 MG TABS tablet Take 2 tablets (20 mg total) by mouth daily before breakfast. 180 tablet 0   glucose blood (ACCU-CHEK GUIDE) test strip  USE 1 STRIP TO TEST GLUCOSE TWICE DAILY E11.65 100 each 1   hydrocortisone-pramoxine (PROCTOFOAM-HC) rectal foam Place 1 applicator rectally 2 (two) times daily. 10 g 3   Insulin Pen Needle (PEN NEEDLES) 31G X 5 MM MISC 1 each by Does not apply route in the morning, at noon, in the evening, and at bedtime. 200 each 3   lidocaine-prilocaine (EMLA) cream Apply a small amount to port a cath site and cover with plastic wrap 1 hour prior to infusion appointments 30 g 3   meloxicam (MOBIC) 7.5 MG tablet Take 1 tablet (7.5 mg total) by mouth daily. 30 tablet 5   nystatin (MYCOSTATIN) 100000 UNIT/ML suspension Take 5 mLs (500,000 Units total) by mouth 4 (four) times daily. 473 mL 2   pantoprazole (PROTONIX) 40 MG tablet Take 1 tablet by mouth once daily 30 tablet 0   potassium chloride SA (KLOR-CON M) 20 MEQ tablet Take 1 tablet (20 mEq total) by mouth 2 (two) times daily. 30 tablet 6   prochlorperazine (COMPAZINE) 10 MG tablet Take 1 tablet (10 mg total) by mouth every 6 (six) hours as needed for nausea or vomiting. 60 tablet 3   sucralfate (CARAFATE) 1 g tablet Dissolve 2 tablets in 50-100 mL of warm tap water and use the suspension as enema 3 times daily 60 tablet 0    No results found for this or any previous visit (from the past 48 hour(s)). No results found.  Review of Systems  All other systems reviewed and are negative.   Blood pressure 118/78, pulse 99, temperature 98.5 F (36.9 C), temperature source Oral, resp. rate 19, height 5\' 11"  (1.803 m), weight 98 kg, SpO2 96%. Physical Exam  GENERAL: The patient is AO x3, in no acute distress. HEENT: Head is normocephalic and atraumatic. EOMI are intact. Mouth is well hydrated and without lesions. NECK: Supple. No masses LUNGS: Clear to auscultation. No presence of rhonchi/wheezing/rales. Adequate chest expansion HEART: RRR, normal s1 and s2. ABDOMEN: Soft, nontender, no guarding, no peritoneal signs, and nondistended. BS +. No  masses. EXTREMITIES: Without any cyanosis, clubbing, rash, lesions or edema. NEUROLOGIC: AOx3, no focal motor deficit. SKIN: no jaundice, no rashes  Assessment/Plan 47 y/o M with PMH RECTAL CANCER, GERD, diabetes, hyperlipidemia, hypertension, coming for follow-up of rectal cancer.Will proceed with flexible sigmoidoscopy  Dolores Frame, MD 01/07/2023, 9:41 AM

## 2023-01-07 NOTE — OR Nursing (Signed)
Raymond Malone had a procedure on 01/07/2023 at [facility]. He is unable to operate heavy machinery or drive for the next 24 hours. If you have any questions, feel free to contact us.

## 2023-01-07 NOTE — Transfer of Care (Addendum)
Immediate Anesthesia Transfer of Care Note  Patient: Raymond Malone  Procedure(s) Performed: FLEXIBLE SIGMOIDOSCOPY BIOPSY  Patient Location: Short Stay  Anesthesia Type:General  Level of Consciousness: awake, drowsy, and patient cooperative  Airway & Oxygen Therapy: Patient Spontanous Breathing and Patient connected to nasal cannula oxygen  Post-op Assessment: Report given to RN and Post -op Vital signs reviewed and stable  Post vital signs: Reviewed and stable  Last Vitals:  Vitals Value Taken Time  BP 113/66 01/07/23   1005  Temp 36.7 01/07/23   1005  Pulse 98 01/07/23   1005  Resp 20 01/07/23   1005  SpO2 97% 01/07/23   1005    Last Pain:  Vitals:   01/07/23 0944  TempSrc:   PainSc: 0-No pain      Patients Stated Pain Goal: 7 (01/07/23 0923)  Complications: No notable events documented.

## 2023-01-07 NOTE — Op Note (Signed)
[facility] Patient Name: Raymond Malone Procedure Date: 01/07/2023 9:13 AM MRN: [id number] Date of Birth: 1975/12/08 Attending MD: Katrinka Blazing , , [id number] CSN: [id number] Age: 47 Admit Type: Outpatient Procedure:                Flexible Sigmoidoscopy Indications:              Personal history of malignant rectal neoplasm Providers:                Katrinka Blazing, Buel Ream. Windell Hummingbird, RN, Lennice Sites Technician, Technician Referring MD:             Stephanie Coup. White MD, MD Medicines:                Monitored Anesthesia Care Complications:            No immediate complications. Estimated Blood Loss:     Estimated blood loss: none. Procedure:                Pre-Anesthesia Assessment:                           - Prior to the procedure, a History and Physical                            was performed, and patient medications, allergies                            and sensitivities were reviewed. The patient's                            tolerance of previous anesthesia was reviewed.                           - The risks and benefits of the procedure and the                            sedation options and risks were discussed with the                            patient. All questions were answered and informed                            consent was obtained.                           - ASA Grade Assessment: II - A patient with mild                            systemic disease.                           After obtaining informed consent, the scope was  passed under direct vision. The [id number]-HQ190L                            ([id number]) scope was introduced through the anus and                            advanced to the the sigmoid colon. The flexible                            sigmoidoscopy was accomplished without difficulty.                            The patient tolerated the procedure well. The                             quality of the bowel preparation was adequate to                            identify polyps greater than 5 mm in size. Scope In: 9:47:34 AM Scope Out: 9:53:47 AM Total Procedure Duration: 0 hours 6 minutes 13 seconds  Findings:      The perianal and digital rectal examinations were normal.      A 10 mm healthy appearing scar with reactive slightly nodular edges was       found at 8 cm proximal to the anus. Biopsies were taken with a cold       forceps for histology.      A tattoo was seen in the rectum, 1 cm distal to the scar site. The       tattoo site appeared normal.      Non-bleeding internal hemorrhoids were found during retroflexion. The       hemorrhoids were small. Impression:               - Healthy appearing scar at site of previous rectal                            cancer. Biospied                           - A tattoo was seen in the rectum. The tattoo site                            appeared normal.                           - Non-bleeding internal hemorrhoids. Moderate Sedation:      Per Anesthesia Care Recommendation:           - Discharge patient to home (ambulatory).                           - Resume previous diet.                           - Await pathology results.                           -  Repeat flexible sigmoidoscopy in 6 months for                            surveillance. Procedure Code(s):        --- Professional ---                           [phone], Sigmoidoscopy, flexible; with biopsy, single                            or multiple Diagnosis Code(s):        --- Professional ---                           K64.8, Other hemorrhoids                           Z85.048, Personal history of other malignant                            neoplasm of rectum, rectosigmoid junction, and anus CPT copyright 2022 [employer]. All rights reserved. The codes documented in this report are preliminary and upon coder review may  be revised to meet current  compliance requirements. Katrinka Blazing, MD Katrinka Blazing,  01/07/2023 10:01:51 AM This report has been signed electronically. Number of Addenda: 0

## 2023-01-08 NOTE — Anesthesia Postprocedure Evaluation (Signed)
Anesthesia Post Note  Patient: Raymond Malone  Procedure(s) Performed: FLEXIBLE SIGMOIDOSCOPY BIOPSY  Patient location during evaluation: Phase II Anesthesia Type: General Level of consciousness: awake Pain management: pain level controlled Vital Signs Assessment: post-procedure vital signs reviewed and stable Respiratory status: spontaneous breathing and respiratory function stable Cardiovascular status: blood pressure returned to baseline and stable Postop Assessment: no headache and no apparent nausea or vomiting Anesthetic complications: no Comments: Late entry   No notable events documented.   Last Vitals:  Vitals:   01/07/23 0923 01/07/23 1005  BP: 118/78 113/66  Pulse: 99 98  Resp: 19 20  Temp: 36.9 C 36.7 C  SpO2: 96% 97%    Last Pain:  Vitals:   01/07/23 1005  TempSrc: Oral  PainSc: 0-No pain                 Windell Norfolk

## 2023-01-13 ENCOUNTER — Encounter (HOSPITAL_COMMUNITY): Payer: Self-pay | Admitting: Gastroenterology

## 2023-01-14 LAB — SURGICAL PATHOLOGY

## 2023-01-15 ENCOUNTER — Encounter (INDEPENDENT_AMBULATORY_CARE_PROVIDER_SITE_OTHER): Payer: Self-pay | Admitting: *Deleted

## 2023-01-21 ENCOUNTER — Ambulatory Visit: Payer: 59 | Admitting: "Endocrinology

## 2023-02-10 ENCOUNTER — Other Ambulatory Visit: Payer: Self-pay | Admitting: Family Medicine

## 2023-02-10 ENCOUNTER — Other Ambulatory Visit: Payer: Self-pay | Admitting: Hematology

## 2023-02-10 ENCOUNTER — Other Ambulatory Visit: Payer: Self-pay | Admitting: "Endocrinology

## 2023-02-10 DIAGNOSIS — E785 Hyperlipidemia, unspecified: Secondary | ICD-10-CM

## 2023-02-10 DIAGNOSIS — K219 Gastro-esophageal reflux disease without esophagitis: Secondary | ICD-10-CM

## 2023-02-10 DIAGNOSIS — E119 Type 2 diabetes mellitus without complications: Secondary | ICD-10-CM

## 2023-02-11 ENCOUNTER — Encounter: Payer: Self-pay | Admitting: Hematology

## 2023-02-13 ENCOUNTER — Ambulatory Visit
Admission: EM | Admit: 2023-02-13 | Discharge: 2023-02-13 | Disposition: A | Discharge status: Home / Self Care | Payer: Medicaid Other | Attending: Family Medicine | Admitting: Family Medicine

## 2023-02-13 ENCOUNTER — Other Ambulatory Visit: Payer: Self-pay

## 2023-02-13 ENCOUNTER — Ambulatory Visit: Payer: 59

## 2023-02-13 ENCOUNTER — Encounter: Payer: Self-pay | Admitting: Emergency Medicine

## 2023-02-13 DIAGNOSIS — S9032XA Contusion of left foot, initial encounter: Secondary | ICD-10-CM | POA: Diagnosis not present

## 2023-02-13 NOTE — ED Triage Notes (Signed)
Pt reports accidentally stepped on daughter's foot last night and reports pt's foot turned and reports left lateral foot pain ever since. Pt reports ankle surgery on LLE in the past. No obvious deformity noted. Pt able to bear weight with increased pain and tenderness.

## 2023-02-13 NOTE — ED Provider Notes (Signed)
[facility]    CSN: [id number] Arrival date & time: 02/13/23  1319      History   Chief Complaint Chief Complaint  Patient presents with   Foot Pain    HPI Raymond Malone is a 47 y.o. male.   Presenting today with left lateral foot pain and swelling after he turned his foot wrong last night.  Able to bear weight but significant pain with doing so.  No decreased range of motion, numbness, tingling, weakness.  Taking over-the-counter pain relievers with no relief.    Past Medical History:  Diagnosis Date   Abscess, axilla 10/06/2016   Alcohol dependence (HCC) 10/06/2016   Current smoker 10/06/2016   Dyshidrotic eczema 10/27/2016   GERD (gastroesophageal reflux disease)    Hyperlipidemia    Hyperosmolar non-ketotic state in patient with type 2 diabetes mellitus (HCC) 10/06/2016   Hypertension    MRSA (methicillin resistant Staphylococcus aureus)    Skin abscess    Type 2 diabetes mellitus (HCC)     Patient Active Problem List   Diagnosis Date Noted   History of rectal cancer 01/07/2023   Hypokalemia 05/06/2022   Chronic pain of left ankle 02/04/2022   Rectal cancer (HCC) 01/07/2022   SVT (supraventricular tachycardia) (HCC) 04/16/2021   Class 1 obesity due to excess calories with serious comorbidity and body mass index (BMI) of 32.0 to 32.9 in adult 03/05/2021   GERD (gastroesophageal reflux disease) 07/03/2020   Erectile dysfunction 06/19/2020   Mixed hyperlipidemia 02/09/2020   Blurred vision 02/09/2020   Need for immunization against influenza 02/09/2020   Intermittent palpitations 02/09/2020   Obesity (BMI 30.0-34.9) 12/22/2016   Type 2 diabetes mellitus without complication, with long-term current use of insulin (HCC) 10/14/2016   Essential hypertension 10/14/2016   Skin abscess     Past Surgical History:  Procedure Laterality Date   ANKLE SURGERY     BIOPSY  01/06/2022   Procedure: BIOPSY;  Surgeon: Dolores Frame, MD;  [facility];  Service: Gastroenterology;;   BIOPSY  07/16/2022   Procedure: BIOPSY;  Surgeon: Dolores Frame, MD;  [facility];  Service: Gastroenterology;;   BIOPSY  01/07/2023   Procedure: BIOPSY;  Surgeon: Dolores Frame, MD;  [facility];  Service: Gastroenterology;;   COLONOSCOPY WITH PROPOFOL N/A 01/06/2022   Procedure: COLONOSCOPY WITH PROPOFOL;  Surgeon: Dolores Frame, MD;  [facility];  Service: Gastroenterology;  Laterality: N/A;  730   FLEXIBLE SIGMOIDOSCOPY N/A 07/16/2022   Procedure: FLEXIBLE SIGMOIDOSCOPY;  Surgeon: Dolores Frame, MD;  [facility];  Service: Gastroenterology;  Laterality: N/A;  215pm, asa 1-2   FLEXIBLE SIGMOIDOSCOPY N/A 01/07/2023   Procedure: FLEXIBLE SIGMOIDOSCOPY;  Surgeon: Dolores Frame, MD;  [facility];  Service: Gastroenterology;  Laterality: N/A;  10:15AM;ASA 1   HAND SURGERY     boxer's fracture L hand   INCISION AND DRAINAGE PERIRECTAL ABSCESS     IR IMAGING GUIDED PORT INSERTION  02/10/2022   MYRINGOTOMY WITH TUBE PLACEMENT Bilateral 07/22/2020   Procedure: BILATERAL MYRINGOTOMY WITH TUBE PLACEMENT;  Surgeon: Newman Pies, MD;  Location: [facility];  Service: ENT;  Laterality: Bilateral;   POLYPECTOMY  01/06/2022   Procedure: POLYPECTOMY INTESTINAL;  Surgeon: Dolores Frame, MD;  [facility];  Service: Gastroenterology;;   SVT ABLATION N/A 04/16/2021   Procedure: SVT ABLATION;  Surgeon: Marinus Maw, MD;  [facility];  Service: Cardiovascular;  Laterality: N/A;       Home Medications    Prior to Admission medications   Medication Sig Start Date End Date Taking? Authorizing Provider  Accu-Chek Softclix Lancets lancets 1 each by Other route 2 (two) times daily. Use as instructed 12/09/21   Roma Kayser, MD  blood glucose meter kit and supplies Dispense based on patient and  insurance preference. Use up to four times daily as directed. (FOR ICD-10 E10.9, E11.9). 02/10/21   Heather Roberts, NP  Blood Glucose Monitoring Suppl (ACCU-CHEK GUIDE ME) w/Device KIT Use to test BG bid. E11.65 12/22/22   Roma Kayser, MD  empagliflozin (JARDIANCE) 10 MG TABS tablet Take 2 tablets (20 mg total) by mouth daily before breakfast. 11/19/22   Nida, Denman George, MD  gabapentin (NEURONTIN) 300 MG capsule TAKE 1 CAPSULE BY MOUTH THREE TIMES DAILY 02/11/23   Doreatha Massed, MD  glucose blood (ACCU-CHEK GUIDE) test strip USE 1 STRIP TO TEST GLUCOSE TWICE DAILY E11.65 12/22/22   Roma Kayser, MD  hydrocortisone-pramoxine [facility]) rectal foam Place 1 applicator rectally 2 (two) times daily. 10/22/22   Doreatha Massed, MD  Insulin Pen Needle (PEN NEEDLES) 31G X 5 MM MISC 1 each by Does not apply route in the morning, at noon, in the evening, and at bedtime. 09/22/21   Gilmore Laroche, FNP  lidocaine (XYLOCAINE) 2 % solution Apply 5 mL to rectal area using cotton ball every 3 hours prn rectal pain 10/17/22   Burgess Amor, PA-C  lidocaine-prilocaine (EMLA) cream Apply a small amount to port a cath site and cover with plastic wrap 1 hour prior to infusion appointments 02/10/22   Doreatha Massed, MD  lisinopril-hydrochlorothiazide (ZESTORETIC) 20-25 MG tablet Take 1 tablet by mouth daily. 03/27/22   Gilmore Laroche, FNP  meloxicam (MOBIC) 7.5 MG tablet Take 1 tablet (7.5 mg total) by mouth daily. 02/12/22   Vickki Hearing, MD  metFORMIN (GLUCOPHAGE) 500 MG tablet TAKE 1 TABLET BY MOUTH TWICE DAILY WITH A MEAL 02/11/23   Nida, Denman George, MD  nystatin (MYCOSTATIN) 100000 UNIT/ML suspension Take 5 mLs (500,000 Units total) by mouth 4 (four) times daily. 05/18/22   Doreatha Massed, MD  pantoprazole (PROTONIX) 40 MG tablet Take 1 tablet by mouth once daily 02/11/23   Gilmore Laroche, FNP  potassium chloride SA (KLOR-CON M) 20 MEQ tablet Take 1 tablet (20 mEq  total) by mouth 2 (two) times daily. 10/21/22   Doreatha Massed, MD  prochlorperazine (COMPAZINE) 10 MG tablet Take 1 tablet (10 mg total) by mouth every 6 (six) hours as needed for nausea or vomiting. 09/03/22   Doreatha Massed, MD  simvastatin (ZOCOR) 40 MG tablet TAKE 1 TABLET BY MOUTH ONCE DAILY IN THE EVENING 02/11/23   Roma Kayser, MD  sucralfate (CARAFATE) 1 g tablet Dissolve 2 tablets in 50-100 mL of warm tap water and use the suspension as enema 3 times daily 10/21/22   Doreatha Massed, MD    Family History Family History  Problem Relation Age of Onset   Hypertension Mother     Social History Social History   Tobacco Use   Smoking status: Former    Current packs/day: 1.00    Average packs/day: 1 pack/day for 28.8 years (28.8 ttl pk-yrs)    Types: Cigarettes    Start date: 05/11/1994   Smokeless tobacco: Never  Vaping Use   Vaping status: Every Day   Substances: Nicotine, Flavoring  Substance Use Topics   Alcohol use:  Not Currently    Comment: Occasional   Drug use: Not Currently    Types: Marijuana     Allergies   Patient has no known allergies.   Review of Systems Review of Systems Per HPI  Physical Exam Triage Vital Signs ED Triage Vitals  Encounter Vitals Group     BP 02/13/23 1407 108/70     Systolic BP Percentile --      Diastolic BP Percentile --      Pulse Rate 02/13/23 1407 91     Resp 02/13/23 1407 20     Temp 02/13/23 1407 98.5 F (36.9 C)     Temp Source 02/13/23 1407 Oral     SpO2 02/13/23 1407 93 %     Weight --      Height --      Head Circumference --      Peak Flow --      Pain Score 02/13/23 1406 7     Pain Loc --      Pain Education --      Exclude from Growth Chart --    No data found.  Updated Vital Signs BP 108/70 (BP Location: Right Arm)   Pulse 91   Temp 98.5 F (36.9 C) (Oral)   Resp 20   SpO2 93%   Visual Acuity Right Eye Distance:   Left Eye Distance:   Bilateral Distance:    Right Eye  Near:   Left Eye Near:    Bilateral Near:     Physical Exam Vitals and nursing note reviewed.  Constitutional:      Appearance: Normal appearance.  HENT:     Head: Atraumatic.  Eyes:     Extraocular Movements: Extraocular movements intact.     Conjunctiva/sclera: Conjunctivae normal.  Cardiovascular:     Rate and Rhythm: Normal rate and regular rhythm.  Pulmonary:     Effort: Pulmonary effort is normal.     Breath sounds: Normal breath sounds.  Musculoskeletal:        General: Swelling, tenderness and signs of injury present. Normal range of motion.     Cervical back: Normal range of motion and neck supple.     Comments: Tender to palpation and edematous to left lateral foot, no bony deformity palpable and range of motion intact  Skin:    General: Skin is warm and dry.     Findings: No bruising or erythema.  Neurological:     General: No focal deficit present.     Mental Status: He is oriented to person, place, and time.     Motor: No weakness.     Gait: Gait normal.     Comments: Left lower extremity neurovascularly intact  Psychiatric:        Mood and Affect: Mood normal.        Thought Content: Thought content normal.        Judgment: Judgment normal.      UC Treatments / Results  Labs (all labs ordered are listed, but only abnormal results are displayed) Labs Reviewed - No data to display  EKG   Radiology DG Foot Complete Left  Result Date: 02/13/2023 CLINICAL DATA:  Left foot pain since yesterday. EXAM: LEFT FOOT - COMPLETE 3+ VIEW COMPARISON:  None Available. FINDINGS: There is no evidence of fracture or dislocation. Minor spurring of the first metatarsal phalangeal joint. Mild dorsal spurring in the midfoot. Postsurgical change of the distal tibia and fibula, intact were visualized. No erosion or periostitis.  Soft tissues are unremarkable. IMPRESSION: Mild degenerative change of the first metatarsophalangeal joint and midfoot. No acute findings. Electronically  Signed   By: Narda Rutherford M.D.   On: 02/13/2023 14:46    Procedures Procedures (including critical care time)  Medications Ordered in UC Medications - No data to display  Initial Impression / Assessment and Plan / UC Course  I have reviewed the triage vital signs and the nursing notes.  Pertinent labs & imaging results that were available during my care of the patient were reviewed by me and considered in my medical decision making (see chart for details).     X-ray of the left foot negative for acute bony abnormality.  Ace wrap applied, discussed RICE protocol, over-the-counter pain relievers.  Return for worsening symptoms.  Final Clinical Impressions(s) / UC Diagnoses   Final diagnoses:  Contusion of left foot, initial encounter   Discharge Instructions   None    ED Prescriptions   None    PDMP not reviewed this encounter.   Particia Nearing, [place] 02/13/23 1520

## 2023-02-15 ENCOUNTER — Encounter: Payer: Self-pay | Admitting: Hematology

## 2023-02-15 ENCOUNTER — Encounter: Payer: Self-pay | Admitting: Family Medicine

## 2023-02-15 ENCOUNTER — Ambulatory Visit (INDEPENDENT_AMBULATORY_CARE_PROVIDER_SITE_OTHER): Payer: 59 | Admitting: Family Medicine

## 2023-02-15 VITALS — BP 117/75 | HR 91 | Ht 71.0 in | Wt 219.1 lb

## 2023-02-15 DIAGNOSIS — N528 Other male erectile dysfunction: Secondary | ICD-10-CM | POA: Diagnosis not present

## 2023-02-15 DIAGNOSIS — Z794 Long term (current) use of insulin: Secondary | ICD-10-CM

## 2023-02-15 DIAGNOSIS — Z0001 Encounter for general adult medical examination with abnormal findings: Secondary | ICD-10-CM | POA: Diagnosis not present

## 2023-02-15 DIAGNOSIS — Z1159 Encounter for screening for other viral diseases: Secondary | ICD-10-CM

## 2023-02-15 DIAGNOSIS — E119 Type 2 diabetes mellitus without complications: Secondary | ICD-10-CM | POA: Diagnosis not present

## 2023-02-15 DIAGNOSIS — E559 Vitamin D deficiency, unspecified: Secondary | ICD-10-CM

## 2023-02-15 DIAGNOSIS — E038 Other specified hypothyroidism: Secondary | ICD-10-CM

## 2023-02-15 DIAGNOSIS — E7849 Other hyperlipidemia: Secondary | ICD-10-CM

## 2023-02-15 MED ORDER — SILDENAFIL CITRATE 100 MG PO TABS: 50.0000 mg | ORAL_TABLET | Freq: Every day | ORAL | 1 refills | Status: DC | PRN

## 2023-02-15 NOTE — Assessment & Plan Note (Signed)
Physical exam as documented Discussed heart-healthy diet  Encouraged to Exercise: If you are able: 30 -60 minutes a day ,4 days a week, or 150 minutes a week. The longer the better. Combine stretch, strength, and aerobic activities Encourage to eat whole Food, Plant Predominant Nutrition is highly recommended: Eat Plenty of vegetables, Mushrooms, fruits, Legumes, Whole Grains, Nuts, seeds in lieu of processed meats, processed snacks/pastries red meat, poultry, eggs.  Will f/u in 1 year for CPE

## 2023-02-15 NOTE — Patient Instructions (Addendum)
I appreciate the opportunity to provide care to you today!    Follow up:  4 months  Labs: please stop by the lab during the week to get your blood drawn (CBC, CMP, TSH, Lipid profile, HgA1c, Vit D)   Please schedule an appointment with [facility] in [place] by calling [phone]. If you need any assistance or further information, feel free to ask!   Please report to the emergency department immediately if you experience any of these symptoms.  Severe Pain: Intense, debilitating pain in or around the eye that is not relieved by over-the-counter pain medications. Vision Changes: Sudden loss of vision, blurred vision, double vision, or any significant change in visual acuity. Photophobia: Extreme sensitivity to light, which can accompany severe eye pain. Eye Redness: Persistent redness in the eye, especially if accompanied by pain, discharge, or swelling. Eye Injury: Recent trauma to the eye, such as a foreign object, chemical exposure, or a blow to the eye. Headache: Severe headache associated with eye pain, particularly if the headache is unilateral or accompanied by nausea or vomiting. Nausea and Vomiting: Associated with eye pain, particularly if the pain is severe and persistent. Visual Disturbances: Seeing halos, flashes of light, or floaters, especially if these symptoms are sudden or worsening. Swelling: Significant swelling around the eye, particularly if it is sudden and severe. Discharge: Unusual or thick discharge from the eye, which may be accompanied by redness or swelling. Changes in Pupils: Abnormal pupil size or shape, or differences in pupil reaction to light, which may indicate a serious underlying issue. Systemic Symptoms: Fever, rash, or other systemic symptoms that accompany eye pain, potentially indicating an infection or systemic illness. Persistent Symptoms: Eye pain that does not improve with self-care measures or persists over time.   Attached with your AVS, you  will find valuable resources for self-education. I highly recommend dedicating some time to thoroughly examine them.   Please continue to a heart-healthy diet and increase your physical activities. Try to exercise for at least five days a week.    It was a pleasure to see you and I look forward to continuing to work together on your health and well-being. Please do not hesitate to call the office if you need care or have questions about your care.  In case of emergency, please visit the Emergency Department for urgent care, or contact our clinic at [phone] to schedule an appointment. We're here to help you!   Have a wonderful day and week. With Gratitude, Gilmore Laroche MSN, FNP-BC

## 2023-02-15 NOTE — Progress Notes (Signed)
Complete physical exam  Patient: Raymond Malone   DOB: [birth year]   47 y.o. Male  MRN: [id number]  Subjective:    Chief Complaint  Patient presents with   Annual Exam    Cpe today, wants to stay on  top of all his screenings.     Raymond Malone is a 47 y.o. male who presents today for a complete physical exam. He reports consuming a general diet. Home exercise routine includes walking. He generally feels well. He reports sleeping well. He does not have additional problems to discuss today.    Most recent fall risk assessment:    02/15/2023    4:02 PM  Fall Risk   Falls in the past year? 0  Number falls in past yr: 0  Injury with Fall? 0  Risk for fall due to : No Fall Risks  Follow up Falls evaluation completed     Most recent depression screenings:    02/15/2023    4:02 PM 02/24/2022   10:18 AM  PHQ 2/9 Scores  PHQ - 2 Score 0 0  PHQ- 9 Score 0     Dental: No current dental problems and No regular dental care   Patient Active Problem List   Diagnosis Date Noted   Encounter for annual general medical examination with abnormal findings in adult 02/15/2023   History of rectal cancer 01/07/2023   Hypokalemia 05/06/2022   Chronic pain of left ankle 02/04/2022   Rectal cancer (HCC) 01/07/2022   SVT (supraventricular tachycardia) (HCC) 04/16/2021   Class 1 obesity due to excess calories with serious comorbidity and body mass index (BMI) of 32.0 to 32.9 in adult 03/05/2021   GERD (gastroesophageal reflux disease) 07/03/2020   Erectile dysfunction 06/19/2020   Mixed hyperlipidemia 02/09/2020   Blurred vision 02/09/2020   Need for immunization against influenza 02/09/2020   Intermittent palpitations 02/09/2020   Obesity (BMI 30.0-34.9) 12/22/2016   Type 2 diabetes mellitus without complication, with long-term current use of insulin (HCC) 10/14/2016   Essential hypertension 10/14/2016   Skin abscess    Past Medical History:  Diagnosis Date   Abscess, axilla  10/06/2016   Alcohol dependence (HCC) 10/06/2016   Current smoker 10/06/2016   Dyshidrotic eczema 10/27/2016   GERD (gastroesophageal reflux disease)    Hyperlipidemia    Hyperosmolar non-ketotic state in patient with type 2 diabetes mellitus (HCC) 10/06/2016   Hypertension    MRSA (methicillin resistant Staphylococcus aureus)    Skin abscess    Type 2 diabetes mellitus (HCC)    Past Surgical History:  Procedure Laterality Date   ANKLE SURGERY     BIOPSY  01/06/2022   Procedure: BIOPSY;  Surgeon: Dolores Frame, MD;  [facility]: [facility];  Service: Gastroenterology;;   BIOPSY  07/16/2022   Procedure: BIOPSY;  Surgeon: Dolores Frame, MD;  [facility]: [facility];  Service: Gastroenterology;;   BIOPSY  01/07/2023   Procedure: BIOPSY;  Surgeon: Dolores Frame, MD;  [facility]: [facility];  Service: Gastroenterology;;   COLONOSCOPY WITH PROPOFOL N/A 01/06/2022   Procedure: COLONOSCOPY WITH PROPOFOL;  Surgeon: Dolores Frame, MD;  Location: [facility];  Service: Gastroenterology;  Laterality: N/A;  730   FLEXIBLE SIGMOIDOSCOPY N/A 07/16/2022   Procedure: FLEXIBLE SIGMOIDOSCOPY;  Surgeon: Dolores Frame, MD;  Location: [facility];  Service: Gastroenterology;  Laterality: N/A;  215pm, asa 1-2   FLEXIBLE SIGMOIDOSCOPY N/A 01/07/2023   Procedure: FLEXIBLE SIGMOIDOSCOPY;  Surgeon: Dolores Frame, MD;  [facility]: [facility];  Service: Gastroenterology;  Laterality: N/A;  10:15AM;ASA 1   HAND SURGERY     boxer's fracture L hand   INCISION AND DRAINAGE PERIRECTAL ABSCESS     IR IMAGING GUIDED PORT INSERTION  02/10/2022   MYRINGOTOMY WITH TUBE PLACEMENT Bilateral 07/22/2020   Procedure: BILATERAL MYRINGOTOMY WITH TUBE PLACEMENT;  Surgeon: Newman Pies, MD;  Location: [facility];  Service: ENT;  Laterality: Bilateral;   POLYPECTOMY  01/06/2022   Procedure: POLYPECTOMY INTESTINAL;  Surgeon: Dolores Frame, MD;   Location: [facility];  Service: Gastroenterology;;   SVT ABLATION N/A 04/16/2021   Procedure: SVT ABLATION;  Surgeon: Marinus Maw, MD;  Location: [facility];  Service: Cardiovascular;  Laterality: N/A;   Social History   Tobacco Use   Smoking status: Former    Current packs/day: 1.00    Average packs/day: 1 pack/day for 28.8 years (28.8 ttl pk-yrs)    Types: Cigarettes    Start date: 05/11/1994   Smokeless tobacco: Never  Vaping Use   Vaping status: Every Day   Substances: Nicotine, Flavoring  Substance Use Topics   Alcohol use: Not Currently    Comment: Occasional   Drug use: Not Currently    Types: Marijuana   Social History   Socioeconomic History   Marital status: Significant Other    Spouse name: Shameka   Number of children: 7   Years of education: 14   Highest education level: Not on file  Occupational History   Occupation: factory work, sorting  Tobacco Use   Smoking status: Former    Current packs/day: 1.00    Average packs/day: 1 pack/day for 28.8 years (28.8 ttl pk-yrs)    Types: Cigarettes    Start date: 05/11/1994   Smokeless tobacco: Never  Vaping Use   Vaping status: Every Day   Substances: Nicotine, Flavoring  Substance and Sexual Activity   Alcohol use: Not Currently    Comment: Occasional   Drug use: Not Currently    Types: Marijuana   Sexual activity: Yes    Birth control/protection: Condom  Other Topics Concern   Not on file  Social History Narrative   Lives with fiancee, mother, and 7 children, 3 are biological his, 2 are hers and 1 nephew   No pets in the home      Enjoys: spending time with the kids      Diet: Eats all food groups, does not have a true focus for diabetes   Caffeine: Drinks about 1 or 2 sodas a week overall has reduced his caffeine intake sometimes some tea   Water: Reports taking 6-8 times a day for more      Wears a seatbelt, does not use his phone or driving   Smoke detectors at home   Does not have  fire extinguisher at this time   No weapons in the home   Social Determinants of Health   Financial Resource Strain: Low Risk  (02/09/2020)   Overall Financial Resource Strain (CARDIA)    Difficulty of Paying Living Expenses: Not hard at all  Food Insecurity: No Food Insecurity (02/09/2020)   Hunger Vital Sign    Worried About Running Out of Food in the Last Year: Never true    Ran Out of Food in the Last Year: Never true  Transportation Needs: No Transportation Needs (02/09/2020)   PRAPARE - Administrator, Civil Service (Medical): No    Lack of Transportation (  Non-Medical): No  Physical Activity: Inactive (02/09/2020)   Exercise Vital Sign    Days of Exercise per Week: 0 days    Minutes of Exercise per Session: 0 min  Stress: Stress Concern Present (02/09/2020)   [employer] of Occupational Health - Occupational Stress Questionnaire    Feeling of Stress : To some extent  Social Connections: Moderately Isolated (02/09/2020)   Social Connection and Isolation Panel [NHANES]    Frequency of Communication with Friends and Family: More than three times a week    Frequency of Social Gatherings with Friends and Family: More than three times a week    Attends Religious Services: Never    Database administrator or Organizations: No    Attends Banker Meetings: Never    Marital Status: Living with partner  Intimate Partner Violence: Not At Risk (02/09/2020)   Humiliation, Afraid, Rape, and Kick questionnaire    Fear of Current or Ex-Partner: No    Emotionally Abused: No    Physically Abused: No    Sexually Abused: No   Family Status  Relation Name Status   Mother  Alive   Father  Alive   Daughter  Alive   Son  Alive   Son  Alive   Daughter  Alive   Daughter  Alive   Daughter  Alive   Daughter  Alive  No partnership data on file   Family History  Problem Relation Age of Onset   Hypertension Mother    No Known Allergies    Patient Care  Team: Gilmore Laroche, FNP as PCP - General (Family Medicine) Jonelle Sidle, MD as PCP - Cardiology (Cardiology)   Outpatient Medications Prior to Visit  Medication Sig   Accu-Chek Softclix Lancets lancets 1 each by Other route 2 (two) times daily. Use as instructed   blood glucose meter kit and supplies Dispense based on patient and insurance preference. Use up to four times daily as directed. (FOR ICD-10 E10.9, E11.9).   Blood Glucose Monitoring Suppl (ACCU-CHEK GUIDE ME) w/Device KIT Use to test BG bid. E11.65   empagliflozin (JARDIANCE) 10 MG TABS tablet Take 2 tablets (20 mg total) by mouth daily before breakfast.   gabapentin (NEURONTIN) 300 MG capsule TAKE 1 CAPSULE BY MOUTH THREE TIMES DAILY   glucose blood (ACCU-CHEK GUIDE) test strip USE 1 STRIP TO TEST GLUCOSE TWICE DAILY E11.65   hydrocortisone-pramoxine (PROCTOFOAM-HC) rectal foam Place 1 applicator rectally 2 (two) times daily.   Insulin Pen Needle (PEN NEEDLES) 31G X 5 MM MISC 1 each by Does not apply route in the morning, at noon, in the evening, and at bedtime.   lisinopril-hydrochlorothiazide (ZESTORETIC) 20-25 MG tablet Take 1 tablet by mouth daily.   metFORMIN (GLUCOPHAGE) 500 MG tablet TAKE 1 TABLET BY MOUTH TWICE DAILY WITH A MEAL   pantoprazole (PROTONIX) 40 MG tablet Take 1 tablet by mouth once daily   potassium chloride SA (KLOR-CON M) 20 MEQ tablet Take 1 tablet (20 mEq total) by mouth 2 (two) times daily.   simvastatin (ZOCOR) 40 MG tablet TAKE 1 TABLET BY MOUTH ONCE DAILY IN THE EVENING   [DISCONTINUED] lidocaine (XYLOCAINE) 2 % solution Apply 5 mL to rectal area using cotton ball every 3 hours prn rectal pain   [DISCONTINUED] lidocaine-prilocaine (EMLA) cream Apply a small amount to port a cath site and cover with plastic wrap 1 hour prior to infusion appointments   [DISCONTINUED] meloxicam (MOBIC) 7.5 MG tablet Take 1 tablet (7.5 mg total)  by mouth daily.   [DISCONTINUED] nystatin (MYCOSTATIN) 100000 UNIT/ML  suspension Take 5 mLs (500,000 Units total) by mouth 4 (four) times daily.   [DISCONTINUED] prochlorperazine (COMPAZINE) 10 MG tablet Take 1 tablet (10 mg total) by mouth every 6 (six) hours as needed for nausea or vomiting.   [DISCONTINUED] sucralfate (CARAFATE) 1 g tablet Dissolve 2 tablets in 50-100 mL of warm tap water and use the suspension as enema 3 times daily   No facility-administered medications prior to visit.    Review of Systems  Constitutional:  Negative for chills, fever and malaise/fatigue.  HENT:  Negative for congestion and sinus pain.   Eyes:  Negative for double vision, photophobia, pain, discharge and redness.       Reports trouble seeing close up  Respiratory:  Negative for cough, sputum production and shortness of breath.   Cardiovascular:  Negative for chest pain, palpitations, claudication and leg swelling.  Gastrointestinal:  Negative for diarrhea, heartburn and nausea.  Genitourinary:  Negative for flank pain and frequency.  Musculoskeletal:  Negative for back pain and joint pain.  Skin:  Negative for itching.  Neurological:  Negative for dizziness, seizures and headaches.  Endo/Heme/Allergies:  Negative for environmental allergies.  Psychiatric/Behavioral:  Negative for memory loss. The patient does not have insomnia.        Objective:    BP 117/75   Pulse 91   Ht 5\' 11"  (1.803 m)   Wt 219 lb 1.3 oz (99.4 kg)   SpO2 94%   BMI 30.56 kg/m  BP Readings from Last 3 Encounters:  02/15/23 117/75  02/13/23 108/70  01/07/23 113/66   Wt Readings from Last 3 Encounters:  02/15/23 219 lb 1.3 oz (99.4 kg)  01/07/23 216 lb (98 kg)  12/21/22 217 lb 12.8 oz (98.8 kg)      Physical Exam HENT:     Head: Normocephalic.     Right Ear: External ear normal.     Left Ear: External ear normal.     Nose: No congestion.     Mouth/Throat:     Mouth: Mucous membranes are moist.  Eyes:     Extraocular Movements: Extraocular movements intact.     Pupils: Pupils  are equal, round, and reactive to light.  Cardiovascular:     Rate and Rhythm: Regular rhythm.     Heart sounds: No murmur heard. Pulmonary:     Effort: No respiratory distress.     Breath sounds: Normal breath sounds.  Abdominal:     Tenderness: There is no right CVA tenderness or left CVA tenderness.  Musculoskeletal:     Cervical back: No rigidity or tenderness.     Right lower leg: No edema.     Left lower leg: No edema.  Neurological:     Mental Status: He is alert and oriented to person, place, and time.     GCS: GCS eye subscore is 4. GCS verbal subscore is 5. GCS motor subscore is 6.     Cranial Nerves: No facial asymmetry.     Motor: No atrophy.     Coordination: Coordination normal. Finger-Nose-Finger Test normal.     Gait: Gait normal.  Psychiatric:        Judgment: Judgment normal.     No results found for any visits on 02/15/23. Last CBC Lab Results  Component Value Date   WBC 4.0 12/11/2022   HGB 13.8 12/11/2022   HCT 39.0 12/11/2022   MCV 89.2 12/11/2022   MCH 31.6 12/11/2022  RDW 14.0 12/11/2022   PLT 310 12/11/2022   Last metabolic panel Lab Results  Component Value Date   GLUCOSE 156 (H) 12/11/2022   NA 131 (L) 12/11/2022   K 4.2 12/11/2022   CL 98 12/11/2022   CO2 25 12/11/2022   BUN 12 12/11/2022   CREATININE 0.81 12/11/2022   GFRNONAA >60 12/11/2022   CALCIUM 8.9 12/11/2022   PHOS 3.4 02/08/2017   PROT 7.1 12/11/2022   ALBUMIN 4.2 12/11/2022   LABGLOB 2.4 11/21/2021   AGRATIO 2.0 11/21/2021   BILITOT 0.6 12/11/2022   ALKPHOS 74 12/11/2022   AST 26 12/11/2022   ALT 37 12/11/2022   ANIONGAP 8 12/11/2022   Last lipids Lab Results  Component Value Date   CHOL 126 11/21/2021   HDL 40 11/21/2021   LDLCALC 68 11/21/2021   TRIG 93 11/21/2021   CHOLHDL 3.2 11/21/2021   Last hemoglobin A1c Lab Results  Component Value Date   HGBA1C 6.7 09/17/2022   Last thyroid functions Lab Results  Component Value Date   TSH 1.130 11/21/2021    Last vitamin D Lab Results  Component Value Date   VD25OH 22 (L) 02/08/2017   Last vitamin B12 and Folate No results found for: "VITAMINB12", "FOLATE"      Assessment & Plan:    Routine Health Maintenance and Physical Exam  Immunization History  Administered Date(s) Administered   Influenza,inj,Quad PF,6+ Mos 02/09/2020, 02/10/2021, 02/03/2022   Pneumococcal Polysaccharide-23 10/07/2016   Tdap 10/19/2012    Health Maintenance  Topic Date Due   COVID-19 Vaccine (1) Never done   Hepatitis C Screening  Never done   Diabetic kidney evaluation - Urine ACR  02/08/2018   INFLUENZA VACCINE  08/09/2023 (Originally 12/10/2022)   DTaP/Tdap/Td (2 - Td or Tdap) 02/15/2024 (Originally 10/20/2022)   HEMOGLOBIN A1C  03/20/2023   OPHTHALMOLOGY EXAM  10/07/2023   Diabetic kidney evaluation - eGFR measurement  12/11/2023   FOOT EXAM  02/15/2024   Fecal DNA (Cologuard)  09/25/2024   HIV Screening  Completed   HPV VACCINES  Aged Out   Colonoscopy  Discontinued    Discussed health benefits of physical activity, and encouraged him to engage in regular exercise appropriate for his age and condition.  Encounter for annual general medical examination with abnormal findings in adult Assessment & Plan: Physical exam as documented Discussed heart-healthy diet  Encouraged to Exercise: If you are able: 30 -60 minutes a day ,4 days a week, or 150 minutes a week. The longer the better. Combine stretch, strength, and aerobic activities Encourage to eat whole Food, Plant Predominant Nutrition is highly recommended: Eat Plenty of vegetables, Mushrooms, fruits, Legumes, Whole Grains, Nuts, seeds in lieu of processed meats, processed snacks/pastries red meat, poultry, eggs.  Will f/u in 1 year for CPE      Other male erectile dysfunction Assessment & Plan: The patient reports an inability to sustain an erection and requests to start pharmacological therapy. We will initiate Viagra 50 mg to be taken an  hour before sexual activity. The patient was advised not to take more than one dose in 24 hours and verbalized understanding.  Orders: -     Sildenafil Citrate; Take 0.5 tablets (50 mg total) by mouth daily as needed for erectile dysfunction. Erectile dysfunction: recommended to take 1 hour prior to sexual activity; do not take >1 dose per day  Dispense: 20 tablet; Refill: 1  Type 2 diabetes mellitus without complication, with long-term current use of insulin (HCC) -  Microalbumin / creatinine urine ratio -     HM Diabetes Foot Exam -     Hemoglobin A1c  Vitamin D deficiency -     VITAMIN D 25 Hydroxy (Vit-D Deficiency, Fractures)  Need for hepatitis C screening test -     Hepatitis C antibody  Other specified hypothyroidism -     TSH + free T4  Other hyperlipidemia -     Lipid panel -     CMP14+EGFR -     CBC with Differential/Platelet    Return in about 1 year (around 02/15/2024) for CPE.    Note: This chart has been completed using Engineer, civil (consulting) software, and while attempts have been made to ensure accuracy, certain words and phrases may not be transcribed as intended.   Gilmore Laroche, FNP

## 2023-02-15 NOTE — Assessment & Plan Note (Signed)
The patient reports an inability to sustain an erection and requests to start pharmacological therapy. We will initiate Viagra 50 mg to be taken an hour before sexual activity. The patient was advised not to take more than one dose in 24 hours and verbalized understanding.

## 2023-02-16 ENCOUNTER — Other Ambulatory Visit: Payer: Self-pay

## 2023-03-02 DIAGNOSIS — Z794 Long term (current) use of insulin: Secondary | ICD-10-CM | POA: Diagnosis not present

## 2023-03-02 DIAGNOSIS — E038 Other specified hypothyroidism: Secondary | ICD-10-CM | POA: Diagnosis not present

## 2023-03-02 DIAGNOSIS — E559 Vitamin D deficiency, unspecified: Secondary | ICD-10-CM | POA: Diagnosis not present

## 2023-03-02 DIAGNOSIS — E119 Type 2 diabetes mellitus without complications: Secondary | ICD-10-CM | POA: Diagnosis not present

## 2023-03-02 DIAGNOSIS — Z1159 Encounter for screening for other viral diseases: Secondary | ICD-10-CM | POA: Diagnosis not present

## 2023-03-02 DIAGNOSIS — E7849 Other hyperlipidemia: Secondary | ICD-10-CM | POA: Diagnosis not present

## 2023-03-03 ENCOUNTER — Other Ambulatory Visit: Payer: Self-pay | Admitting: Family Medicine

## 2023-03-03 ENCOUNTER — Encounter: Payer: Self-pay | Admitting: Hematology

## 2023-03-03 DIAGNOSIS — E559 Vitamin D deficiency, unspecified: Secondary | ICD-10-CM

## 2023-03-03 MED ORDER — VITAMIN D (ERGOCALCIFEROL) 1.25 MG (50000 UNIT) PO CAPS
50000.0000 [IU] | ORAL_CAPSULE | ORAL | 1 refills | Status: DC
Start: 2023-03-03 — End: 2023-07-26

## 2023-03-03 NOTE — Progress Notes (Signed)
Your hemoglobin A1c is 7.1, showing great effort in lowering your A1c levels. I recommend that you continue your current treatment regimen, decrease your intake of high-sugar foods and beverages, and increase physical activity. Your vitamin D is low, and a prescription for a weekly vitamin D supplement has been sent to your pharmacy for you to begin taking.

## 2023-03-04 ENCOUNTER — Encounter: Payer: Self-pay | Admitting: Hematology

## 2023-03-04 LAB — CBC WITH DIFFERENTIAL/PLATELET
Basophils Absolute: 0.1 10*3/uL (ref 0.0–0.2)
Basos: 2 %
EOS (ABSOLUTE): 0.3 10*3/uL (ref 0.0–0.4)
Eos: 7 %
Hematocrit: 46.4 % (ref 37.5–51.0)
Hemoglobin: 15.7 g/dL (ref 13.0–17.7)
Immature Grans (Abs): 0 10*3/uL (ref 0.0–0.1)
Immature Granulocytes: 1 %
Lymphocytes Absolute: 0.5 10*3/uL — ABNORMAL LOW (ref 0.7–3.1)
Lymphs: 13 %
MCH: 30.7 pg (ref 26.6–33.0)
MCHC: 33.8 g/dL (ref 31.5–35.7)
MCV: 91 fL (ref 79–97)
Monocytes Absolute: 0.5 10*3/uL (ref 0.1–0.9)
Monocytes: 12 %
Neutrophils Absolute: 2.7 10*3/uL (ref 1.4–7.0)
Neutrophils: 65 %
Platelets: 344 10*3/uL (ref 150–450)
RBC: 5.11 x10E6/uL (ref 4.14–5.80)
RDW: 11.5 % — ABNORMAL LOW (ref 11.6–15.4)
WBC: 4.2 10*3/uL (ref 3.4–10.8)

## 2023-03-04 LAB — CMP14+EGFR
ALT: 23 [IU]/L (ref 0–44)
AST: 17 [IU]/L (ref 0–40)
Albumin: 4.6 g/dL (ref 4.1–5.1)
Alkaline Phosphatase: 95 [IU]/L (ref 44–121)
BUN/Creatinine Ratio: 14 (ref 9–20)
BUN: 12 mg/dL (ref 6–24)
Bilirubin Total: 0.6 mg/dL (ref 0.0–1.2)
CO2: 21 mmol/L (ref 20–29)
Calcium: 9.5 mg/dL (ref 8.7–10.2)
Chloride: 97 mmol/L (ref 96–106)
Creatinine, Ser: 0.85 mg/dL (ref 0.76–1.27)
Globulin, Total: 2.3 g/dL (ref 1.5–4.5)
Glucose: 132 mg/dL — ABNORMAL HIGH (ref 70–99)
Potassium: 4.3 mmol/L (ref 3.5–5.2)
Sodium: 134 mmol/L (ref 134–144)
Total Protein: 6.9 g/dL (ref 6.0–8.5)
eGFR: 109 mL/min/{1.73_m2} (ref >59)

## 2023-03-04 LAB — MICROALBUMIN / CREATININE URINE RATIO
Creatinine, Urine: 119.1 mg/dL
Microalb/Creat Ratio: 8 mg/g{creat} (ref 0–29)
Microalbumin, Urine: 9.3 ug/mL

## 2023-03-04 LAB — LIPID PANEL
Chol/HDL Ratio: 3.3 {ratio} (ref 0.0–5.0)
Cholesterol, Total: 141 mg/dL (ref 100–199)
HDL: 43 mg/dL (ref >39)
LDL Chol Calc (NIH): 84 mg/dL (ref 0–99)
Triglycerides: 72 mg/dL (ref 0–149)
VLDL Cholesterol Cal: 14 mg/dL (ref 5–40)

## 2023-03-04 LAB — HEMOGLOBIN A1C
Est. average glucose Bld gHb Est-mCnc: 157 mg/dL
Hgb A1c MFr Bld: 7.1 % — ABNORMAL HIGH (ref 4.8–5.6)

## 2023-03-04 LAB — VITAMIN D 25 HYDROXY (VIT D DEFICIENCY, FRACTURES): Vit D, 25-Hydroxy: 13.4 ng/mL — ABNORMAL LOW (ref 30.0–100.0)

## 2023-03-04 LAB — TSH+FREE T4
Free T4: 1.71 ng/dL (ref 0.82–1.77)
TSH: 1.28 u[IU]/mL (ref 0.450–4.500)

## 2023-03-04 LAB — HEPATITIS C ANTIBODY: Hep C Virus Ab: NONREACTIVE

## 2023-03-09 ENCOUNTER — Telehealth: Payer: Self-pay | Admitting: Family Medicine

## 2023-03-09 NOTE — Progress Notes (Signed)
Please inform the patient that there is no established link between sildenafil and rectal cancer. kindly inquire about the patient's specific concerns regarding taking sildenafil

## 2023-03-09 NOTE — Telephone Encounter (Signed)
Pt called in wants a call back

## 2023-03-09 NOTE — Telephone Encounter (Signed)
Spoke with pt

## 2023-03-10 NOTE — Telephone Encounter (Signed)
Pt called in requesting another call back

## 2023-03-10 NOTE — Telephone Encounter (Signed)
Message was sent to pcp yesterday in regards to lab, awaiting response from [place] to call him back with an answer to his question.

## 2023-03-13 ENCOUNTER — Other Ambulatory Visit: Payer: Self-pay | Admitting: Family Medicine

## 2023-03-13 DIAGNOSIS — K219 Gastro-esophageal reflux disease without esophagitis: Secondary | ICD-10-CM

## 2023-03-14 ENCOUNTER — Other Ambulatory Visit: Payer: Self-pay | Admitting: Hematology

## 2023-03-15 ENCOUNTER — Encounter: Payer: Self-pay | Admitting: Hematology

## 2023-03-16 ENCOUNTER — Encounter: Payer: Self-pay | Admitting: Hematology

## 2023-03-25 ENCOUNTER — Other Ambulatory Visit: Payer: Self-pay | Admitting: Family Medicine

## 2023-03-25 DIAGNOSIS — K219 Gastro-esophageal reflux disease without esophagitis: Secondary | ICD-10-CM

## 2023-03-26 ENCOUNTER — Inpatient Hospital Stay: Payer: 59

## 2023-03-27 ENCOUNTER — Ambulatory Visit (HOSPITAL_COMMUNITY): Payer: 59

## 2023-03-30 ENCOUNTER — Inpatient Hospital Stay: Payer: 59 | Attending: Hematology

## 2023-03-30 DIAGNOSIS — C20 Malignant neoplasm of rectum: Secondary | ICD-10-CM | POA: Diagnosis present

## 2023-03-30 LAB — CBC WITH DIFFERENTIAL/PLATELET
Abs Immature Granulocytes: 0.02 10*3/uL (ref 0.00–0.07)
Basophils Absolute: 0.1 10*3/uL (ref 0.0–0.1)
Basophils Relative: 1 %
Eosinophils Absolute: 0.2 10*3/uL (ref 0.0–0.5)
Eosinophils Relative: 4 %
HCT: 45.8 % (ref 39.0–52.0)
Hemoglobin: 15.7 g/dL (ref 13.0–17.0)
Immature Granulocytes: 0 %
Lymphocytes Relative: 11 %
Lymphs Abs: 0.6 10*3/uL — ABNORMAL LOW (ref 0.7–4.0)
MCH: 30 pg (ref 26.0–34.0)
MCHC: 34.3 g/dL (ref 30.0–36.0)
MCV: 87.4 fL (ref 80.0–100.0)
Monocytes Absolute: 0.7 10*3/uL (ref 0.1–1.0)
Monocytes Relative: 11 %
Neutro Abs: 4.1 10*3/uL (ref 1.7–7.7)
Neutrophils Relative %: 73 %
Platelets: 321 10*3/uL (ref 150–400)
RBC: 5.24 MIL/uL (ref 4.22–5.81)
RDW: 11.9 % (ref 11.5–15.5)
WBC: 5.7 10*3/uL (ref 4.0–10.5)
nRBC: 0 % (ref 0.0–0.2)

## 2023-03-30 LAB — COMPREHENSIVE METABOLIC PANEL
ALT: 25 U/L (ref 0–44)
AST: 19 U/L (ref 15–41)
Albumin: 4.5 g/dL (ref 3.5–5.0)
Alkaline Phosphatase: 72 U/L (ref 38–126)
Anion gap: 10 (ref 5–15)
BUN: 11 mg/dL (ref 6–20)
CO2: 24 mmol/L (ref 22–32)
Calcium: 9.1 mg/dL (ref 8.9–10.3)
Chloride: 97 mmol/L — ABNORMAL LOW (ref 98–111)
Creatinine, Ser: 0.78 mg/dL (ref 0.61–1.24)
GFR, Estimated: >60 mL/min (ref >60)
Glucose, Bld: 183 mg/dL — ABNORMAL HIGH (ref 70–99)
Potassium: 4 mmol/L (ref 3.5–5.1)
Sodium: 131 mmol/L — ABNORMAL LOW (ref 135–145)
Total Bilirubin: 0.6 mg/dL (ref <1.2)
Total Protein: 7.8 g/dL (ref 6.5–8.1)

## 2023-03-31 LAB — CEA: CEA: 3 ng/mL (ref 0.0–4.7)

## 2023-04-01 ENCOUNTER — Inpatient Hospital Stay: Payer: 59 | Admitting: Hematology

## 2023-04-03 ENCOUNTER — Ambulatory Visit (HOSPITAL_COMMUNITY)
Admission: RE | Admit: 2023-04-03 | Discharge: 2023-04-03 | Disposition: A | Discharge status: Home / Self Care | Payer: 59 | Source: Ambulatory Visit | Attending: Hematology | Admitting: Hematology

## 2023-04-03 ENCOUNTER — Encounter (HOSPITAL_COMMUNITY): Payer: Self-pay

## 2023-04-03 DIAGNOSIS — C20 Malignant neoplasm of rectum: Secondary | ICD-10-CM

## 2023-04-07 ENCOUNTER — Other Ambulatory Visit: Payer: Self-pay

## 2023-04-12 ENCOUNTER — Encounter: Payer: Self-pay | Admitting: Hematology

## 2023-04-12 ENCOUNTER — Ambulatory Visit: Payer: 59 | Admitting: Urology

## 2023-04-12 ENCOUNTER — Inpatient Hospital Stay: Payer: 59 | Admitting: Hematology

## 2023-04-13 ENCOUNTER — Inpatient Hospital Stay: Payer: 59 | Admitting: Hematology

## 2023-04-14 ENCOUNTER — Inpatient Hospital Stay: Payer: 59 | Admitting: Hematology

## 2023-04-17 ENCOUNTER — Ambulatory Visit (HOSPITAL_COMMUNITY)
Admission: RE | Admit: 2023-04-17 | Discharge: 2023-04-17 | Disposition: A | Discharge status: Home / Self Care | Payer: 59 | Source: Ambulatory Visit | Attending: Hematology | Admitting: Hematology

## 2023-04-17 DIAGNOSIS — C20 Malignant neoplasm of rectum: Secondary | ICD-10-CM | POA: Insufficient documentation

## 2023-04-26 ENCOUNTER — Other Ambulatory Visit: Payer: Self-pay | Admitting: Family Medicine

## 2023-04-26 ENCOUNTER — Other Ambulatory Visit: Payer: Self-pay | Admitting: Hematology

## 2023-04-26 DIAGNOSIS — K219 Gastro-esophageal reflux disease without esophagitis: Secondary | ICD-10-CM

## 2023-05-06 ENCOUNTER — Inpatient Hospital Stay: Payer: 59 | Attending: Hematology | Admitting: Hematology

## 2023-05-06 ENCOUNTER — Encounter: Payer: Self-pay | Admitting: Hematology

## 2023-05-06 VITALS — BP 107/74 | HR 90 | Temp 98.0°F | Resp 18 | Wt 224.0 lb

## 2023-05-06 DIAGNOSIS — Z9221 Personal history of antineoplastic chemotherapy: Secondary | ICD-10-CM | POA: Diagnosis not present

## 2023-05-06 DIAGNOSIS — Z87891 Personal history of nicotine dependence: Secondary | ICD-10-CM | POA: Diagnosis not present

## 2023-05-06 DIAGNOSIS — E871 Hypo-osmolality and hyponatremia: Secondary | ICD-10-CM | POA: Insufficient documentation

## 2023-05-06 DIAGNOSIS — C20 Malignant neoplasm of rectum: Secondary | ICD-10-CM

## 2023-05-06 DIAGNOSIS — G629 Polyneuropathy, unspecified: Secondary | ICD-10-CM | POA: Insufficient documentation

## 2023-05-06 DIAGNOSIS — Z923 Personal history of irradiation: Secondary | ICD-10-CM | POA: Insufficient documentation

## 2023-05-06 DIAGNOSIS — Z85048 Personal history of other malignant neoplasm of rectum, rectosigmoid junction, and anus: Secondary | ICD-10-CM | POA: Insufficient documentation

## 2023-05-06 MED ORDER — GABAPENTIN 300 MG PO CAPS: 300.0000 mg | ORAL_CAPSULE | Freq: Three times a day (TID) | ORAL | 3 refills | Status: DC
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
## 2023-05-07 ENCOUNTER — Other Ambulatory Visit: Payer: Self-pay

## 2023-05-24 ENCOUNTER — Ambulatory Visit (INDEPENDENT_AMBULATORY_CARE_PROVIDER_SITE_OTHER): Payer: 59 | Admitting: Urology

## 2023-05-24 VITALS — BP 114/75 | HR 94

## 2023-05-24 DIAGNOSIS — N529 Male erectile dysfunction, unspecified: Secondary | ICD-10-CM | POA: Diagnosis not present

## 2023-05-24 MED ORDER — TADALAFIL 20 MG PO TABS: 20.0000 mg | ORAL_TABLET | ORAL | 5 refills | Status: DC | PRN

## 2023-05-24 NOTE — Patient Instructions (Signed)

## 2023-05-24 NOTE — Progress Notes (Signed)
 05/24/2023 10:04 AM   Raymond Malone [birth year] [id number]  Referring provider: Zarwolo, Gloria, FNP [address] [place],  [place] [address]  Erectile dysfunction   HPI: Raymond Malone is a 47yo here for evaluation of erectile dysfunction. He has a hx of rectal cancer and finished chemotherapy in 2023 and radiation last year. He has issues both getting and then maintaining the erection. He has tried sildenafil  50mg  and 100mg  without success. He saw a marginal improvement with the sildenafil . He had DMII and last A1c was 7.1   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) 10/06/2016   Hypertension    MRSA (methicillin resistant Staphylococcus aureus)    Skin abscess    Type 2 diabetes mellitus (HCC)     Surgical History: Past Surgical History:  Procedure Laterality Date   ANKLE SURGERY     BIOPSY  01/06/2022   Procedure: BIOPSY;  Surgeon: Eartha Angelia Sieving, MD;  [facility];  Service: Gastroenterology;;   BIOPSY  07/16/2022   Procedure: BIOPSY;  Surgeon: Eartha Angelia Sieving, MD;  [facility];  Service: Gastroenterology;;   BIOPSY  01/07/2023   Procedure: BIOPSY;  Surgeon: Eartha Angelia Sieving, MD;  [facility];  Service: Gastroenterology;;   COLONOSCOPY WITH PROPOFOL  N/A 01/06/2022   Procedure: COLONOSCOPY WITH PROPOFOL ;  Surgeon: Eartha Angelia Sieving, MD;  [facility];  Service: Gastroenterology;  Laterality: N/A;  730   FLEXIBLE SIGMOIDOSCOPY N/A 07/16/2022   Procedure: FLEXIBLE SIGMOIDOSCOPY;  Surgeon: Eartha Angelia Sieving, MD;  [facility];  Service: Gastroenterology;  Laterality: N/A;  215pm, asa 1-2   FLEXIBLE SIGMOIDOSCOPY N/A  01/07/2023   Procedure: FLEXIBLE SIGMOIDOSCOPY;  Surgeon: Eartha Angelia Sieving, MD;  [facility];  Service: Gastroenterology;  Laterality: N/A;  10:15AM;ASA 1   HAND SURGERY     boxer's fracture L hand   INCISION AND DRAINAGE PERIRECTAL ABSCESS     IR IMAGING GUIDED PORT INSERTION  02/10/2022   MYRINGOTOMY WITH TUBE PLACEMENT Bilateral 07/22/2020   Procedure: BILATERAL MYRINGOTOMY WITH TUBE PLACEMENT;  Surgeon: Karis Clunes, MD;  Location: [facility];  Service: ENT;  Laterality: Bilateral;   POLYPECTOMY  01/06/2022   Procedure: POLYPECTOMY INTESTINAL;  Surgeon: Eartha Angelia Sieving, MD;  [facility];  Service: Gastroenterology;;   SVT ABLATION N/A 04/16/2021   Procedure: SVT ABLATION;  Surgeon: Waddell Danelle ORN, MD;  [facility];  Service: Cardiovascular;  Laterality: N/A;    Home Medications:  Allergies as of 05/24/2023   No Known Allergies      Medication List        Accurate as of May 24, 2023 10:04 AM. If you have any questions, ask your nurse or doctor.          STOP taking these medications    Pen Needles 31G X 5 MM Misc Stopped by: Belvie Clara   potassium chloride  SA 20 MEQ tablet Commonly known as: KLOR-CON  M Stopped by: Belvie Clara       TAKE these medications    Accu-Chek Guide Me w/Device Kit Use to test BG bid. E11.65   Accu-Chek Guide test strip Generic drug: glucose blood USE 1 STRIP  TO TEST GLUCOSE TWICE DAILY E11.65   Accu-Chek Softclix Lancets lancets 1 each by Other route 2 (two) times daily. Use as instructed   blood glucose meter kit and supplies Dispense based on patient and insurance preference. Use up to four times daily as directed. (FOR ICD-10 E10.9, E11.9).   empagliflozin  10 MG Tabs tablet Commonly known as: Jardiance  Take 2 tablets (20 mg total) by mouth daily before breakfast.   gabapentin  300 MG capsule Commonly known as: NEURONTIN  Take 1 capsule (300 mg  total) by mouth 3 (three) times daily.   hydrocortisone -pramoxine rectal foam Commonly known as: PROCTOFOAM-HC Place 1 applicator rectally 2 (two) times daily.   lisinopril -hydrochlorothiazide  20-25 MG tablet Commonly known as: ZESTORETIC  Take 1 tablet by mouth daily.   metFORMIN  500 MG tablet Commonly known as: GLUCOPHAGE  TAKE 1 TABLET BY MOUTH TWICE DAILY WITH A MEAL   pantoprazole  40 MG tablet Commonly known as: PROTONIX  Take 1 tablet by mouth once daily   sildenafil  100 MG tablet Commonly known as: Viagra  Take 0.5 tablets (50 mg total) by mouth daily as needed for erectile dysfunction. Erectile dysfunction: recommended to take 1 hour prior to sexual activity; do not take >1 dose per day   simvastatin  40 MG tablet Commonly known as: ZOCOR  TAKE 1 TABLET BY MOUTH ONCE DAILY IN THE EVENING   Vitamin D  (Ergocalciferol ) 1.25 MG (50000 UNIT) Caps capsule Commonly known as: DRISDOL  Take 1 capsule (50,000 Units total) by mouth every 7 (seven) days.        Allergies: No Known Allergies  Family History: Family History  Problem Relation Age of Onset   Hypertension Mother     Social History:  reports that he has quit smoking. His smoking use included cigarettes. He started smoking about 29 years ago. He has a 29 pack-year smoking history. He has never used smokeless tobacco. He reports that he does not currently use alcohol. He reports that he does not currently use drugs after having used the following drugs: Marijuana.  ROS: All other review of systems were reviewed and are negative except what is noted above in HPI  Physical Exam: BP 114/75   Pulse 94   Constitutional:  Alert and oriented, No acute distress. HEENT: [place] AT, moist mucus membranes.  Trachea midline, no masses. Cardiovascular: No clubbing, cyanosis, or edema. Respiratory: Normal respiratory effort, no increased work of breathing. GI: Abdomen is soft, nontender, nondistended, no abdominal masses GU: No CVA  tenderness.  Lymph: No cervical or inguinal lymphadenopathy. Skin: No rashes, bruises or suspicious lesions. Neurologic: Grossly intact, no focal deficits, moving all 4 extremities. Psychiatric: Normal mood and affect.  Laboratory Data: Lab Results  Component Value Date   WBC 5.7 03/30/2023   HGB 15.7 03/30/2023   HCT 45.8 03/30/2023   MCV 87.4 03/30/2023   PLT 321 03/30/2023    Lab Results  Component Value Date   CREATININE 0.78 03/30/2023    No results found for: PSA  No results found for: TESTOSTERONE   Lab Results  Component Value Date   HGBA1C 7.1 (H) 03/02/2023    Urinalysis    Component Value Date/Time   COLORURINE YELLOW 10/19/2022 1252   APPEARANCEUR CLEAR 10/19/2022 1252   LABSPEC 1.036 (H) 10/19/2022 1252   PHURINE 5.0 10/19/2022 1252   GLUCOSEU >=500 (A) 10/19/2022 1252   HGBUR NEGATIVE 10/19/2022 1252   BILIRUBINUR NEGATIVE 10/19/2022 1252   BILIRUBINUR negative 12/19/2021 1643   KETONESUR 20 (A) 10/19/2022 1252   PROTEINUR NEGATIVE 10/19/2022 1252  UROBILINOGEN 4.0 (A) 12/19/2021 1643   UROBILINOGEN 1.0 01/24/2015 1630   NITRITE NEGATIVE 10/19/2022 1252   LEUKOCYTESUR NEGATIVE 10/19/2022 1252    Lab Results  Component Value Date   LABMICR 9.3 03/02/2023   BACTERIA NONE SEEN 10/19/2022    Pertinent Imaging:  No results found for this or any previous visit.  No results found for this or any previous visit.  No results found for this or any previous visit.  No results found for this or any previous visit.  No results found for this or any previous visit.  No results found for this or any previous visit.  No results found for this or any previous visit.  No results found for this or any previous visit.   Assessment & Plan:    1. Erectile dysfunction, unspecified erectile dysfunction type (Primary) We will check testosterone  labs. We will also trial tadalafil  20mg  prn   No follow-ups on file.  Belvie Clara, MD  [facility] [place]

## 2023-05-27 ENCOUNTER — Encounter: Payer: Self-pay | Admitting: Urology

## 2023-05-28 LAB — TESTOSTERONE,FREE AND TOTAL
Testosterone, Free: 2.9 pg/mL — ABNORMAL LOW (ref 6.8–21.5)
Testosterone: 156 ng/dL — ABNORMAL LOW (ref 264–916)

## 2023-06-01 ENCOUNTER — Telehealth: Payer: Self-pay

## 2023-06-01 ENCOUNTER — Other Ambulatory Visit: Payer: 59

## 2023-06-01 ENCOUNTER — Other Ambulatory Visit: Payer: Self-pay | Admitting: Urology

## 2023-06-01 DIAGNOSIS — E291 Testicular hypofunction: Secondary | ICD-10-CM

## 2023-06-01 NOTE — Telephone Encounter (Signed)
-----   Message from Wilkie Aye sent at 06/01/2023  9:26 AM EST ----- Raymond Malone is low. I ordered more labs ----- Message ----- From: Nell Range Lab Results In Sent: 05/28/2023   5:38 AM EST To: Malen Gauze, MD

## 2023-06-01 NOTE — Telephone Encounter (Signed)
Patient called and made aware. Lab visit scheduled for this afternoon.

## 2023-06-02 LAB — ESTRADIOL: Estradiol: <5 pg/mL — ABNORMAL LOW (ref 7.6–42.6)

## 2023-06-02 LAB — FSH/LH
FSH: 17.9 m[IU]/mL — ABNORMAL HIGH (ref 1.5–12.4)
LH: 10.7 m[IU]/mL — ABNORMAL HIGH (ref 1.7–8.6)

## 2023-06-02 LAB — TSH: TSH: 0.882 u[IU]/mL (ref 0.450–4.500)

## 2023-06-02 LAB — PROLACTIN: Prolactin: 12.7 ng/mL (ref 3.9–22.7)

## 2023-06-03 ENCOUNTER — Other Ambulatory Visit: Payer: Self-pay | Admitting: Family Medicine

## 2023-06-03 ENCOUNTER — Other Ambulatory Visit: Payer: Self-pay | Admitting: "Endocrinology

## 2023-06-03 DIAGNOSIS — I10 Essential (primary) hypertension: Secondary | ICD-10-CM

## 2023-06-03 DIAGNOSIS — K219 Gastro-esophageal reflux disease without esophagitis: Secondary | ICD-10-CM

## 2023-06-04 ENCOUNTER — Other Ambulatory Visit: Payer: Self-pay | Admitting: Oncology

## 2023-06-04 ENCOUNTER — Other Ambulatory Visit: Payer: Self-pay

## 2023-06-04 DIAGNOSIS — I10 Essential (primary) hypertension: Secondary | ICD-10-CM

## 2023-06-04 MED ORDER — DOCUSATE SODIUM 100 MG PO CAPS: 100.0000 mg | ORAL_CAPSULE | Freq: Two times a day (BID) | ORAL | 0 refills | Status: DC

## 2023-06-04 MED ORDER — LISINOPRIL-HYDROCHLOROTHIAZIDE 20-25 MG PO TABS: 1.0000 | ORAL_TABLET | Freq: Every day | ORAL | 1 refills | Status: DC

## 2023-06-04 MED ORDER — SENNA 8.6 MG PO TABS: 1.0000 | ORAL_TABLET | Freq: Every day | ORAL | 0 refills | Status: AC

## 2023-06-09 ENCOUNTER — Other Ambulatory Visit: Payer: Self-pay

## 2023-06-09 DIAGNOSIS — K5900 Constipation, unspecified: Secondary | ICD-10-CM

## 2023-06-09 MED ORDER — LACTULOSE 10 GM/15ML PO SOLN: 10.0000 g | Freq: Every day | ORAL | 1 refills | Status: DC

## 2023-06-09 NOTE — Telephone Encounter (Signed)
Patient called stating he is having constipation not relieved by colace, Lactulose sent into patient's pharmacy per standing orders.

## 2023-06-11 ENCOUNTER — Encounter: Payer: Self-pay | Admitting: Hematology

## 2023-06-18 ENCOUNTER — Other Ambulatory Visit: Payer: Self-pay | Admitting: "Endocrinology

## 2023-06-18 DIAGNOSIS — Z794 Long term (current) use of insulin: Secondary | ICD-10-CM

## 2023-06-18 DIAGNOSIS — E785 Hyperlipidemia, unspecified: Secondary | ICD-10-CM

## 2023-06-21 ENCOUNTER — Ambulatory Visit (INDEPENDENT_AMBULATORY_CARE_PROVIDER_SITE_OTHER): Payer: 59 | Admitting: Family Medicine

## 2023-06-21 ENCOUNTER — Telehealth: Payer: Self-pay

## 2023-06-21 ENCOUNTER — Encounter: Payer: Self-pay | Admitting: Family Medicine

## 2023-06-21 VITALS — BP 126/88 | HR 89 | Ht 71.0 in | Wt 217.1 lb

## 2023-06-21 DIAGNOSIS — E7849 Other hyperlipidemia: Secondary | ICD-10-CM

## 2023-06-21 DIAGNOSIS — E1140 Type 2 diabetes mellitus with diabetic neuropathy, unspecified: Secondary | ICD-10-CM | POA: Diagnosis not present

## 2023-06-21 DIAGNOSIS — R7301 Impaired fasting glucose: Secondary | ICD-10-CM

## 2023-06-21 DIAGNOSIS — K5909 Other constipation: Secondary | ICD-10-CM | POA: Diagnosis not present

## 2023-06-21 DIAGNOSIS — H538 Other visual disturbances: Secondary | ICD-10-CM | POA: Diagnosis not present

## 2023-06-21 DIAGNOSIS — Z7984 Long term (current) use of oral hypoglycemic drugs: Secondary | ICD-10-CM | POA: Diagnosis not present

## 2023-06-21 DIAGNOSIS — E038 Other specified hypothyroidism: Secondary | ICD-10-CM

## 2023-06-21 DIAGNOSIS — K5900 Constipation, unspecified: Secondary | ICD-10-CM | POA: Insufficient documentation

## 2023-06-21 DIAGNOSIS — E559 Vitamin D deficiency, unspecified: Secondary | ICD-10-CM

## 2023-06-21 MED ORDER — VITAMIN B-6 50 MG PO TABS: 50.0000 mg | ORAL_TABLET | Freq: Every day | ORAL | 1 refills | Status: DC

## 2023-06-21 NOTE — Patient Instructions (Addendum)
 I appreciate the opportunity to provide care to you today!    Follow up:  5 months  Labs: please stop by the lab during the week to get your blood drawn (CBC, CMP, TSH, Lipid profile, HgA1c, Vit D)  Neuropathy Management Plan Continue taking Gabapentin  300 mg three times daily. Start taking Pyridoxine 50 mg daily. If minimal relief after 4 weeks, you may increase the dose to 100 mg daily.  Nonpharmacological Interventions for Constipation Constipation can often be managed with lifestyle and dietary modifications. Below are effective nonpharmacological interventions to promote regular bowel movements:  1. Increase Fiber Intake Aim for 25-35 grams of fiber daily from natural sources: Fruits: Apples, pears, berries, prunes Vegetables: Leafy greens, carrots, broccoli Whole Grains: Oatmeal, brown rice, whole wheat bread Legumes: Beans, lentils, chickpeas If needed, fiber supplements (e.g., psyllium, Metamucil, Benefiber) can be added. 2. Stay Hydrated Drink at least 8-10 glasses of water  daily to soften stools. Warm beverages (tea, lemon water , warm prune juice) can help stimulate bowel movements. 3. Increase Physical Activity Engage in moderate exercise for at least 150 minutes per week (e.g., walking, yoga, cycling). Core-strengthening exercises like abdominal workouts may help stimulate bowel function. 4. Establish a Bowel Routine Set aside time to use the bathroom daily, preferably after meals. Avoid delaying bowel movements, as this can lead to harder stools. 5. Use Natural Remedies Prunes or prune juice contain sorbitol, a natural laxative. Flaxseeds or chia seeds can help add bulk to stools. 6. Improve Toilet Posture Use a footstool (like a Squatty Potty) to achieve a more natural squatting position, which relaxes the colon. 7. Reduce Processed and Low-Fiber Foods Avoid processed foods, red meat, dairy, and fried foods, which can contribute to constipation. 8. Manage Stress and  Sleep Chronic stress can affect gut motility; try relaxation techniques like deep breathing, meditation, or yoga. Ensure adequate sleep, as circadian rhythms influence digestion.   Referrals today- GI, Ophthalmology  Attached with your AVS, you will find valuable resources for self-education. I highly recommend dedicating some time to thoroughly examine them.   Please continue to a heart-healthy diet and increase your physical activities. Try to exercise for at least five days a week.    It was a pleasure to see you and I look forward to continuing to work together on your health and well-being. Please do not hesitate to call the office if you need care or have questions about your care.  In case of emergency, please visit the Emergency Department for urgent care, or contact our clinic at [phone] to schedule an appointment. We're here to help you!   Have a wonderful day and week. With Gratitude, Maritsa Hunsucker MSN, FNP-BC

## 2023-06-21 NOTE — Progress Notes (Signed)
 Established Patient Office Visit  Subjective:  Patient ID: Raymond Malone, male    DOB: [birth year]  Age: 48 y.o. MRN: [id number]  CC:  Chief Complaint  Patient presents with   Follow-up    4 mo Ophthalmology referral Neuropathy still causing discomfort. Sharp pain / numbness/ tingling in hands and feet.  Constipation.     HPI Raymond Malone is a 49 y.o. male with past medical history of hyperlipidemia, rectal cancer and essential hypertension presents for f/u of  chronic medical conditions. For the details of today's visit, please refer to the assessment and plan.     Past Medical History:  Diagnosis Date   Abscess, axilla 10/06/2016   Alcohol dependence (HCC) 10/06/2016   Current smoker 10/06/2016   Dyshidrotic eczema 10/27/2016   GERD (gastroesophageal reflux disease)    Hyperlipidemia    Hyperosmolar non-ketotic state in patient with type 2 diabetes mellitus (HCC) 10/06/2016   Hypertension    MRSA (methicillin resistant Staphylococcus aureus)    Skin abscess    Type 2 diabetes mellitus (HCC)     Past Surgical History:  Procedure Laterality Date   ANKLE SURGERY     BIOPSY  01/06/2022   Procedure: BIOPSY;  Surgeon: Urban Garden, MD;  [facility];  Service: Gastroenterology;;   BIOPSY  07/16/2022   Procedure: BIOPSY;  Surgeon: Urban Garden, MD;  [facility];  Service: Gastroenterology;;   BIOPSY  01/07/2023   Procedure: BIOPSY;  Surgeon: Urban Garden, MD;  [facility];  Service: Gastroenterology;;   COLONOSCOPY WITH PROPOFOL  N/A 01/06/2022   Procedure: COLONOSCOPY WITH PROPOFOL ;  Surgeon: Urban Garden, MD;  [facility];  Service: Gastroenterology;  Laterality: N/A;  730   FLEXIBLE SIGMOIDOSCOPY N/A 07/16/2022   Procedure: FLEXIBLE SIGMOIDOSCOPY;  Surgeon: Urban Garden, MD;  [facility];  Service: Gastroenterology;  Laterality: N/A;  215pm, asa 1-2    FLEXIBLE SIGMOIDOSCOPY N/A 01/07/2023   Procedure: FLEXIBLE SIGMOIDOSCOPY;  Surgeon: Urban Garden, MD;  [facility];  Service: Gastroenterology;  Laterality: N/A;  10:15AM;ASA 1   HAND SURGERY     boxer's fracture L hand   INCISION AND DRAINAGE PERIRECTAL ABSCESS     IR IMAGING GUIDED PORT INSERTION  02/10/2022   MYRINGOTOMY WITH TUBE PLACEMENT Bilateral 07/22/2020   Procedure: BILATERAL MYRINGOTOMY WITH TUBE PLACEMENT;  Surgeon: Reynold Caves, MD;  Location: [facility];  Service: ENT;  Laterality: Bilateral;   POLYPECTOMY  01/06/2022   Procedure: POLYPECTOMY INTESTINAL;  Surgeon: Urban Garden, MD;  [facility];  Service: Gastroenterology;;   SVT ABLATION N/A 04/16/2021   Procedure: SVT ABLATION;  Surgeon: Tammie Fall, MD;  [facility];  Service: Cardiovascular;  Laterality: N/A;    Family History  Problem Relation Age of Onset   Hypertension Mother     Social History   Socioeconomic History   Marital status: Single    Spouse name: Shameka   Number of children: 7   Years of education: 14   Highest education level: Not on file  Occupational History   Occupation: factory work, sorting  Tobacco Use   Smoking status: Former    Current packs/day: 1.00    Average packs/day: 1 pack/day for 29.1 years (29.1 ttl pk-yrs)    Types: Cigarettes    Start date: 05/11/1994   Smokeless tobacco: Never  Vaping Use   Vaping status: Every Day  Substances: Nicotine , Flavoring  Substance and Sexual Activity   Alcohol use: Not Currently    Comment: Occasional   Drug use: Not Currently    Types: Marijuana   Sexual activity: Yes    Birth control/protection: Condom  Other Topics Concern   Not on file  Social History Narrative   Lives with fiancee, mother, and 7 children, 3 are biological his, 2 are hers and 1 nephew   No pets in the home      Enjoys: spending time with the kids      Diet: Eats all food groups, does  not have a true focus for diabetes   Caffeine: Drinks about 1 or 2 sodas a week overall has reduced his caffeine intake sometimes some tea   Water : Reports taking 6-8 times a day for more      Wears a seatbelt, does not use his phone or driving   Smoke detectors at home   Does not have fire extinguisher at this time   No weapons in the home   Social Drivers of Health   Financial Resource Strain: Low Risk  (02/09/2020)   Overall Financial Resource Strain (CARDIA)    Difficulty of Paying Living Expenses: Not hard at all  Food Insecurity: No Food Insecurity (02/09/2020)   Hunger Vital Sign    Worried About Running Out of Food in the Last Year: Never true    Ran Out of Food in the Last Year: Never true  Transportation Needs: No Transportation Needs (02/09/2020)   PRAPARE - Administrator, Civil Service (Medical): No    Lack of Transportation (Non-Medical): No  Physical Activity: Inactive (02/09/2020)   Exercise Vital Sign    Days of Exercise per Week: 0 days    Minutes of Exercise per Session: 0 min  Stress: Stress Concern Present (02/09/2020)   [employer] of Occupational Health - Occupational Stress Questionnaire    Feeling of Stress : To some extent  Social Connections: Moderately Isolated (02/09/2020)   Social Connection and Isolation Panel [NHANES]    Frequency of Communication with Friends and Family: More than three times a week    Frequency of Social Gatherings with Friends and Family: More than three times a week    Attends Religious Services: Never    Database administrator or Organizations: No    Attends Banker Meetings: Never    Marital Status: Living with partner  Intimate Partner Violence: Not At Risk (02/09/2020)   Humiliation, Afraid, Rape, and Kick questionnaire    Fear of Current or Ex-Partner: No    Emotionally Abused: No    Physically Abused: No    Sexually Abused: No    Outpatient Medications Prior to Visit  Medication Sig  Dispense Refill   Accu-Chek Softclix Lancets lancets 1 each by Other route 2 (two) times daily. Use as instructed 100 each 2   blood glucose meter kit and supplies Dispense based on patient and insurance preference. Use up to four times daily as directed. (FOR ICD-10 E10.9, E11.9). 1 each 0   Blood Glucose Monitoring Suppl (ACCU-CHEK GUIDE ME) w/Device KIT Use to test BG bid. E11.65 1 kit 0   docusate sodium  (COLACE) 100 MG capsule Take 1 capsule (100 mg total) by mouth 2 (two) times daily. 60 capsule 0   empagliflozin  (JARDIANCE ) 10 MG TABS tablet Take 2 tablets (20 mg total) by mouth daily before breakfast. 180 tablet 0   gabapentin  (NEURONTIN ) 300 MG capsule Take  1 capsule (300 mg total) by mouth 3 (three) times daily. 90 capsule 3   glucose blood (ACCU-CHEK GUIDE) test strip USE 1 STRIP TO TEST GLUCOSE TWICE DAILY E11.65 100 each 1   hydrocortisone -pramoxine (PROCTOFOAM-HC) rectal foam Place 1 applicator rectally 2 (two) times daily. 10 g 3   lactulose  (CHRONULAC ) 10 GM/15ML solution Take 15 mLs (10 g total) by mouth at bedtime. If Colace is not working or 3 days w/o BM Take 15mL at bedtime every night to assist with regular bowel movements. Titrate down if having multiple bowel movements. If a bowel movement has not occurred in 3-4 days or longer take 15mL every 3 hours until a bowel movement has occurred. 236 mL 1   lisinopril -hydrochlorothiazide  (ZESTORETIC ) 20-25 MG tablet Take 1 tablet by mouth daily. 90 tablet 1   metFORMIN  (GLUCOPHAGE ) 500 MG tablet TAKE 1 TABLET BY MOUTH TWICE DAILY WITH A MEAL 180 tablet 0   pantoprazole  (PROTONIX ) 40 MG tablet Take 1 tablet by mouth once daily 30 tablet 0   senna (SENOKOT) 8.6 MG TABS tablet Take 1 tablet (8.6 mg total) by mouth daily. 120 tablet 0   sildenafil  (VIAGRA ) 100 MG tablet Take 0.5 tablets (50 mg total) by mouth daily as needed for erectile dysfunction. Erectile dysfunction: recommended to take 1 hour prior to sexual activity; do not take >1  dose per day 20 tablet 1   simvastatin  (ZOCOR ) 40 MG tablet TAKE 1 TABLET BY MOUTH ONCE DAILY IN THE EVENING 90 tablet 0   tadalafil  (CIALIS ) 20 MG tablet Take 1 tablet (20 mg total) by mouth as needed. 30 tablet 5   Vitamin D , Ergocalciferol , (DRISDOL ) 1.25 MG (50000 UNIT) CAPS capsule Take 1 capsule (50,000 Units total) by mouth every 7 (seven) days. 20 capsule 1   No facility-administered medications prior to visit.    No Known Allergies  ROS Review of Systems  Constitutional:  Negative for fatigue and fever.  Eyes:  Positive for visual disturbance.  Respiratory:  Negative for chest tightness and shortness of breath.   Cardiovascular:  Negative for chest pain and palpitations.  Gastrointestinal:  Positive for constipation.  Neurological:  Positive for numbness. Negative for dizziness and headaches.      Objective:    Physical Exam HENT:     Head: Normocephalic.     Right Ear: External ear normal.     Left Ear: External ear normal.     Nose: No congestion or rhinorrhea.     Mouth/Throat:     Mouth: Mucous membranes are moist.  Cardiovascular:     Rate and Rhythm: Regular rhythm.     Heart sounds: No murmur heard. Pulmonary:     Effort: No respiratory distress.     Breath sounds: Normal breath sounds.  Neurological:     Mental Status: He is alert.     BP 126/88   Pulse 89   Ht 5\' 11"  (1.803 m)   Wt 217 lb 1.3 oz (98.5 kg)   SpO2 (!) 83%   BMI 30.28 kg/m  Wt Readings from Last 3 Encounters:  06/21/23 217 lb 1.3 oz (98.5 kg)  05/06/23 224 lb (101.6 kg)  02/15/23 219 lb 1.3 oz (99.4 kg)    Lab Results  Component Value Date   TSH 0.882 06/01/2023   Lab Results  Component Value Date   WBC 5.7 03/30/2023   HGB 15.7 03/30/2023   HCT 45.8 03/30/2023   MCV 87.4 03/30/2023   PLT 321 03/30/2023  Lab Results  Component Value Date   NA 131 (L) 03/30/2023   K 4.0 03/30/2023   CO2 24 03/30/2023   GLUCOSE 183 (H) 03/30/2023   BUN 11 03/30/2023   CREATININE  0.78 03/30/2023   BILITOT 0.6 03/30/2023   ALKPHOS 72 03/30/2023   AST 19 03/30/2023   ALT 25 03/30/2023   PROT 7.8 03/30/2023   ALBUMIN 4.5 03/30/2023   CALCIUM  9.1 03/30/2023   ANIONGAP 10 03/30/2023   EGFR 109 03/02/2023   Lab Results  Component Value Date   CHOL 141 03/02/2023   Lab Results  Component Value Date   HDL 43 03/02/2023   Lab Results  Component Value Date   LDLCALC 84 03/02/2023   Lab Results  Component Value Date   TRIG 72 03/02/2023   Lab Results  Component Value Date   CHOLHDL 3.3 03/02/2023   Lab Results  Component Value Date   HGBA1C 7.1 (H) 03/02/2023      Assessment & Plan:  Blurred vision Assessment & Plan: The patient complains of blurred vision and reports that he has not had his eyes examined for several years. He usually wears glasses but states that he needs a new prescription, as his old glasses worsen his vision. He reports difficulty seeing up close. No eye pain, redness, floaters, or double vision were noted.  Snellen test results in the clinic: Right eye: 20/30 Left eye: 20/30 Both eyes: 20/25 A referral to ophthalmology has been placed.    Orders: -     Ambulatory referral to Ophthalmology  Type 2 diabetes mellitus with diabetic neuropathy, without long-term current use of insulin  [facility]) Assessment & Plan: The patient is encouraged to continue taking Gabapentin  300 mg three times daily. The patient is also encouraged to start taking Pyridoxine 50 mg daily. If there is minimal relief after four weeks, the dose may be increased to 100 mg daily.   Orders: -     Vitamin B-6; Take 1 tablet (50 mg total) by mouth daily.  Dispense: 90 tablet; Refill: 1  Other constipation Assessment & Plan: The patient reports having a bowel movement every two days only when using a laxative (e.g., Ex-Lax). Symptoms have been ongoing for four weeks. He admits to minimal physical activity but reports increasing his fluid intake to at least  64 ounces daily.  It is recommended that he increase his fiber intake, stay hydrated, and increase his physical activity as tolerated. A referral to gastroenterology will be placed for collaborative care, given the patient's history of rectal adenocarcinoma.   Orders: -     Ambulatory referral to Gastroenterology  IFG (impaired fasting glucose) -     Hemoglobin A1c  Vitamin D  deficiency -     VITAMIN D  25 Hydroxy (Vit-D Deficiency, Fractures)  TSH (thyroid -stimulating hormone deficiency) -     TSH + free T4  Other hyperlipidemia -     Lipid panel -     CMP14+EGFR -     CBC with Differential/Platelet  Note: This chart has been completed using Engineer, civil (consulting) software, and while attempts have been made to ensure accuracy, certain words and phrases may not be transcribed as intended.    Follow-up: Return in about 5 months (around 11/18/2023).   Cathy Crounse, FNP

## 2023-06-21 NOTE — Telephone Encounter (Signed)
 Patient called office to confirm that he wanted to proceed with the gels. Provider made aware.

## 2023-06-21 NOTE — Assessment & Plan Note (Signed)
 The patient reports having a bowel movement every two days only when using a laxative (e.g., Ex-Lax). Symptoms have been ongoing for four weeks. He admits to minimal physical activity but reports increasing his fluid intake to at least 64 ounces daily.  It is recommended that he increase his fiber intake, stay hydrated, and increase his physical activity as tolerated. A referral to gastroenterology will be placed for collaborative care, given the patient's history of rectal adenocarcinoma.

## 2023-06-21 NOTE — Assessment & Plan Note (Signed)
 The patient complains of blurred vision and reports that he has not had his eyes examined for several years. He usually wears glasses but states that he needs a new prescription, as his old glasses worsen his vision. He reports difficulty seeing up close. No eye pain, redness, floaters, or double vision were noted.  Snellen test results in the clinic: Right eye: 20/30 Left eye: 20/30 Both eyes: 20/25 A referral to ophthalmology has been placed.

## 2023-06-21 NOTE — Assessment & Plan Note (Signed)
 The patient is encouraged to continue taking Gabapentin  300 mg three times daily. The patient is also encouraged to start taking Pyridoxine 50 mg daily. If there is minimal relief after four weeks, the dose may be increased to 100 mg daily.

## 2023-06-22 ENCOUNTER — Other Ambulatory Visit: Payer: Self-pay

## 2023-06-22 NOTE — Telephone Encounter (Signed)
Request sent to provider 2/10. Provider has to fill.

## 2023-06-22 NOTE — Telephone Encounter (Signed)
Patient left a voice message 06-22-2023.  [facility] has not received the order request to fill Rx.  Please advise.

## 2023-06-23 ENCOUNTER — Telehealth: Payer: Self-pay | Admitting: Urology

## 2023-06-23 DIAGNOSIS — E7849 Other hyperlipidemia: Secondary | ICD-10-CM | POA: Diagnosis not present

## 2023-06-23 DIAGNOSIS — E559 Vitamin D deficiency, unspecified: Secondary | ICD-10-CM | POA: Diagnosis not present

## 2023-06-23 DIAGNOSIS — E038 Other specified hypothyroidism: Secondary | ICD-10-CM | POA: Diagnosis not present

## 2023-06-23 DIAGNOSIS — R7301 Impaired fasting glucose: Secondary | ICD-10-CM | POA: Diagnosis not present

## 2023-06-23 NOTE — Telephone Encounter (Signed)
Said Dr Ronne Binning was supposed to send an RX for a gel and the pharmacy has not received it yet.

## 2023-06-23 NOTE — Telephone Encounter (Signed)
Please see prior telephone encounter.

## 2023-06-24 ENCOUNTER — Encounter (INDEPENDENT_AMBULATORY_CARE_PROVIDER_SITE_OTHER): Payer: Self-pay | Admitting: *Deleted

## 2023-06-24 ENCOUNTER — Encounter: Payer: Self-pay | Admitting: Hematology

## 2023-06-24 LAB — CBC WITH DIFFERENTIAL/PLATELET
Basophils Absolute: 0.1 10*3/uL (ref 0.0–0.2)
Basos: 2 %
EOS (ABSOLUTE): 0.3 10*3/uL (ref 0.0–0.4)
Eos: 8 %
Hematocrit: 46 % (ref 37.5–51.0)
Hemoglobin: 15.6 g/dL (ref 13.0–17.7)
Immature Grans (Abs): 0 10*3/uL (ref 0.0–0.1)
Immature Granulocytes: 0 %
Lymphocytes Absolute: 0.6 10*3/uL — ABNORMAL LOW (ref 0.7–3.1)
Lymphs: 13 %
MCH: 29.4 pg (ref 26.6–33.0)
MCHC: 33.9 g/dL (ref 31.5–35.7)
MCV: 87 fL (ref 79–97)
Monocytes Absolute: 0.5 10*3/uL (ref 0.1–0.9)
Monocytes: 13 %
Neutrophils Absolute: 2.7 10*3/uL (ref 1.4–7.0)
Neutrophils: 64 %
Platelets: 325 10*3/uL (ref 150–450)
RBC: 5.3 x10E6/uL (ref 4.14–5.80)
RDW: 12.6 % (ref 11.6–15.4)
WBC: 4.2 10*3/uL (ref 3.4–10.8)

## 2023-06-24 LAB — LIPID PANEL
Chol/HDL Ratio: 3.3 {ratio} (ref 0.0–5.0)
Cholesterol, Total: 153 mg/dL (ref 100–199)
HDL: 47 mg/dL (ref >39)
LDL Chol Calc (NIH): 92 mg/dL (ref 0–99)
Triglycerides: 72 mg/dL (ref 0–149)
VLDL Cholesterol Cal: 14 mg/dL (ref 5–40)

## 2023-06-24 LAB — CMP14+EGFR
ALT: 26 [IU]/L (ref 0–44)
AST: 18 [IU]/L (ref 0–40)
Albumin: 4.5 g/dL (ref 4.1–5.1)
Alkaline Phosphatase: 84 [IU]/L (ref 44–121)
BUN/Creatinine Ratio: 15 (ref 9–20)
BUN: 14 mg/dL (ref 6–24)
Bilirubin Total: 0.6 mg/dL (ref 0.0–1.2)
CO2: 24 mmol/L (ref 20–29)
Calcium: 9.9 mg/dL (ref 8.7–10.2)
Chloride: 98 mmol/L (ref 96–106)
Creatinine, Ser: 0.96 mg/dL (ref 0.76–1.27)
Globulin, Total: 2.3 g/dL (ref 1.5–4.5)
Glucose: 153 mg/dL — ABNORMAL HIGH (ref 70–99)
Potassium: 5 mmol/L (ref 3.5–5.2)
Sodium: 136 mmol/L (ref 134–144)
Total Protein: 6.8 g/dL (ref 6.0–8.5)
eGFR: 99 mL/min/{1.73_m2} (ref >59)

## 2023-06-24 LAB — HEMOGLOBIN A1C
Est. average glucose Bld gHb Est-mCnc: 154 mg/dL
Hgb A1c MFr Bld: 7 % — ABNORMAL HIGH (ref 4.8–5.6)

## 2023-06-24 LAB — VITAMIN D 25 HYDROXY (VIT D DEFICIENCY, FRACTURES): Vit D, 25-Hydroxy: 70.7 ng/mL (ref 30.0–100.0)

## 2023-06-24 LAB — TSH+FREE T4
Free T4: 1.65 ng/dL (ref 0.82–1.77)
TSH: 1.38 u[IU]/mL (ref 0.450–4.500)

## 2023-06-25 ENCOUNTER — Other Ambulatory Visit: Payer: Self-pay | Admitting: Family Medicine

## 2023-06-25 DIAGNOSIS — E1140 Type 2 diabetes mellitus with diabetic neuropathy, unspecified: Secondary | ICD-10-CM

## 2023-06-25 MED ORDER — VITAMIN B-6 50 MG PO TABS: 50.0000 mg | ORAL_TABLET | Freq: Every day | ORAL | 1 refills | Status: DC

## 2023-06-25 MED ORDER — GABAPENTIN 300 MG PO CAPS: 300.0000 mg | ORAL_CAPSULE | Freq: Three times a day (TID) | ORAL | 3 refills | Status: DC

## 2023-06-25 NOTE — Telephone Encounter (Signed)
Called back again for his RX

## 2023-06-25 NOTE — Telephone Encounter (Signed)
FYI and refill please

## 2023-06-25 NOTE — Telephone Encounter (Signed)
Copied from CRM [id number]. Topic: Clinical - Medication Refill >> Jun 25, 2023 12:43 PM Antony Haste wrote: Most Recent Primary Care Visit:  Provider: Gilmore Laroche  Department: [facility]  Visit Type: OFFICE VISIT  Date: 06/21/2023  Medication: gabapentin (NEURONTIN) 300 MG capsule pyridOXINE (VITAMIN B6) 50 MG tablet  Has the patient contacted their pharmacy? Yes (Agent: If no, request that the patient contact the pharmacy for the refill. If patient does not wish to contact the pharmacy document the reason why and proceed with request.) (Agent: If yes, when and what did the pharmacy advise?)  Is this the correct pharmacy for this prescription? Yes If no, delete pharmacy and type the correct one.  This is the patient's preferred pharmacy:  [facility] [address], [place] - [address] [place] [address] [place] [address] Phone: [phone] Fax: [phone]   Has the prescription been filled recently? No  Is the patient out of the medication? Yes  Has the patient been seen for an appointment in the last year OR does the patient have an upcoming appointment? Yes, requested for 02/10 visit with PCP.  Can we respond through MyChart? No, callback preferred.  Agent: Please be advised that Rx refills may take up to 3 business days. We ask that you follow-up with your pharmacy.

## 2023-06-25 NOTE — Telephone Encounter (Signed)
Called back, still does not have RX

## 2023-06-28 ENCOUNTER — Other Ambulatory Visit: Payer: Self-pay | Admitting: "Endocrinology

## 2023-06-28 ENCOUNTER — Encounter (INDEPENDENT_AMBULATORY_CARE_PROVIDER_SITE_OTHER): Payer: Self-pay | Admitting: *Deleted

## 2023-06-28 ENCOUNTER — Telehealth: Payer: Self-pay | Admitting: Urology

## 2023-06-28 ENCOUNTER — Other Ambulatory Visit: Payer: Self-pay | Admitting: Family Medicine

## 2023-06-28 DIAGNOSIS — Z794 Long term (current) use of insulin: Secondary | ICD-10-CM

## 2023-06-28 DIAGNOSIS — E1165 Type 2 diabetes mellitus with hyperglycemia: Secondary | ICD-10-CM

## 2023-06-28 DIAGNOSIS — E785 Hyperlipidemia, unspecified: Secondary | ICD-10-CM

## 2023-06-28 NOTE — Telephone Encounter (Signed)
Copied from CRM [id number]. Topic: Clinical - Medication Refill >> Jun 28, 2023  9:55 AM Gery Pray wrote: Most Recent Primary Care Visit:  Provider: Gilmore Laroche  Department: [facility]  Visit Type: OFFICE VISIT  Date: 06/21/2023  Medication: empagliflozin (JARDIANCE) 10 MG TABS tablet, simvastatin (ZOCOR) 40 MG tablet  Has the patient contacted their pharmacy? Yes (Agent: If no, request that the patient contact the pharmacy for the refill. If patient does not wish to contact the pharmacy document the reason why and proceed with request.) (Agent: If yes, when and what did the pharmacy advise?)  Is this the correct pharmacy for this prescription? Yes If no, delete pharmacy and type the correct one.  This is the patient's preferred pharmacy:  [facility] [address], [place] - [address] [place] [address] [place] [address] Phone: [phone] Fax: [phone]  Has the prescription been filled recently? No  Is the patient out of the medication? No  Has the patient been seen for an appointment in the last year OR does the patient have an upcoming appointment? Yes  Can we respond through MyChart? Yes  Agent: Please be advised that Rx refills may take up to 3 business days. We ask that you follow-up with your pharmacy.

## 2023-06-28 NOTE — Telephone Encounter (Signed)
Spoke with patient and made him aware that message had been sent regarding his request to start the testosterone gel. Patient informed that the medication can only be sent in by a provider and that I would send another message informing provider of patients request. Patient voiced understanding.

## 2023-06-28 NOTE — Telephone Encounter (Signed)
Patient called again he has called several times about getting a prescription for a gel?  He has not had a response back and would like to speak with someone.

## 2023-06-29 ENCOUNTER — Other Ambulatory Visit: Payer: Self-pay | Admitting: Urology

## 2023-06-29 MED ORDER — TESTOSTERONE 20.25 MG/1.25GM (1.62%) TD GEL: 2.0000 | Freq: Every day | TRANSDERMAL | 3 refills | Status: DC

## 2023-07-01 ENCOUNTER — Telehealth: Payer: Self-pay

## 2023-07-01 DIAGNOSIS — E1165 Type 2 diabetes mellitus with hyperglycemia: Secondary | ICD-10-CM

## 2023-07-01 MED ORDER — DAPAGLIFLOZIN PROPANEDIOL 10 MG PO TABS: 10.0000 mg | ORAL_TABLET | Freq: Every day | ORAL | 0 refills | Status: DC

## 2023-07-01 NOTE — Telephone Encounter (Signed)
Received notice from [facility] that pt's insurance does not cover Jardiance. Please advise.

## 2023-07-01 NOTE — Telephone Encounter (Signed)
Pt made aware. Rx for Farxiga 10mg  po daily 90 day supply sent to pharmacy per Dr.Nida's orders.

## 2023-07-05 ENCOUNTER — Encounter: Payer: Self-pay | Admitting: Hematology

## 2023-07-05 NOTE — Progress Notes (Signed)
 Please inform the patient that his Hemoglobin A1c is 7.0. I recommend that he continue his current treatment regimen, reduce his intake of high-sugar foods and beverages, and increase physical activity to help manage his blood sugar levels.  All other lab results are stable.

## 2023-07-06 ENCOUNTER — Other Ambulatory Visit: Payer: Self-pay

## 2023-07-06 ENCOUNTER — Telehealth: Payer: Self-pay | Admitting: Family Medicine

## 2023-07-06 DIAGNOSIS — K219 Gastro-esophageal reflux disease without esophagitis: Secondary | ICD-10-CM

## 2023-07-06 DIAGNOSIS — E119 Type 2 diabetes mellitus without complications: Secondary | ICD-10-CM

## 2023-07-06 DIAGNOSIS — E785 Hyperlipidemia, unspecified: Secondary | ICD-10-CM

## 2023-07-06 MED ORDER — PANTOPRAZOLE SODIUM 40 MG PO TBEC: 40.0000 mg | DELAYED_RELEASE_TABLET | Freq: Every day | ORAL | 0 refills | Status: DC

## 2023-07-06 MED ORDER — SIMVASTATIN 40 MG PO TABS: 40.0000 mg | ORAL_TABLET | Freq: Every evening | ORAL | 0 refills | Status: DC

## 2023-07-06 NOTE — Telephone Encounter (Signed)
 Patient asking what was the other medicine provider was suppose to take in place of his [place] insurance will not cover and    Still waiting gon other medicine to take with the Gabapentin to help with pain

## 2023-07-06 NOTE — Telephone Encounter (Signed)
 Prescription Request  07/06/2023  LOV: 06/21/2023  What is the name of the medication or equipment? simvastatin (ZOCOR) 40 MG tablet [[id number]]   pantoprazole (PROTONIX) 40 MG tablet [[id number]]   Patient asking what was the other medicine provider was suppose to take in place of his [place] insurance will not cover and   Still waiting gon other medicine to take with the Gabapentin to help with pain    Have you contacted your pharmacy to request a refill? No   Which pharmacy would you like this sent to?  [facility]  Patient notified that their request is being sent to the clinical staff for review and that they should receive a response within 2 business days.   Please advise at Mobile [phone] (mobile)

## 2023-07-07 ENCOUNTER — Ambulatory Visit: Payer: Medicaid Other | Admitting: Urology

## 2023-07-08 ENCOUNTER — Other Ambulatory Visit: Payer: Self-pay | Admitting: Family Medicine

## 2023-07-08 NOTE — Telephone Encounter (Signed)
 Pyridoxine 50 mg daily was the other medication he was supposed to take along with his gabapentin.

## 2023-07-14 ENCOUNTER — Other Ambulatory Visit: Payer: Self-pay | Admitting: Urology

## 2023-07-14 MED ORDER — TESTOSTERONE 1.62 % TD GEL: 2.0000 | Freq: Every day | TRANSDERMAL | 3 refills | Status: DC

## 2023-07-17 ENCOUNTER — Other Ambulatory Visit: Payer: Self-pay | Admitting: "Endocrinology

## 2023-07-17 DIAGNOSIS — Z794 Long term (current) use of insulin: Secondary | ICD-10-CM

## 2023-07-19 ENCOUNTER — Other Ambulatory Visit: Payer: Self-pay | Admitting: Family Medicine

## 2023-07-19 DIAGNOSIS — E119 Type 2 diabetes mellitus without complications: Secondary | ICD-10-CM

## 2023-07-19 MED ORDER — METFORMIN HCL 500 MG PO TABS: 500.0000 mg | ORAL_TABLET | Freq: Two times a day (BID) | ORAL | 0 refills | Status: DC

## 2023-07-19 NOTE — Telephone Encounter (Signed)
 Copied from CRM [id number]. Topic: Clinical - Medication Refill >> Jul 19, 2023 10:46 AM Shelah Lewandowsky wrote: Most Recent Primary Care Visit:  Provider: Gilmore Laroche  Department: [facility]  Visit Type: OFFICE VISIT  Date: 06/21/2023  Medication: metFORMIN (GLUCOPHAGE) 500 MG tablet  Has the patient contacted their pharmacy? Yes (Agent: If no, request that the patient contact the pharmacy for the refill. If patient does not wish to contact the pharmacy document the reason why and proceed with request.) (Agent: If yes, when and what did the pharmacy advise?)  Is this the correct pharmacy for this prescription? Yes If no, delete pharmacy and type the correct one.  This is the patient's preferred pharmacy:  [facility] [address], [place] - [address] [place] [address] [place] [address] Phone: [phone] Fax: [phone]    Has the prescription been filled recently? Yes  Is the patient out of the medication? Yes  Has the patient been seen for an appointment in the last year OR does the patient have an upcoming appointment? Yes  Can we respond through MyChart? Yes  Agent: Please be advised that Rx refills may take up to 3 business days. We ask that you follow-up with your pharmacy.

## 2023-07-21 ENCOUNTER — Ambulatory Visit: Payer: Self-pay | Admitting: Internal Medicine

## 2023-07-22 ENCOUNTER — Ambulatory Visit (INDEPENDENT_AMBULATORY_CARE_PROVIDER_SITE_OTHER): Payer: 59 | Admitting: Gastroenterology

## 2023-07-22 ENCOUNTER — Encounter (INDEPENDENT_AMBULATORY_CARE_PROVIDER_SITE_OTHER): Payer: Self-pay | Admitting: Gastroenterology

## 2023-07-22 VITALS — BP 121/82 | HR 108 | Temp 98.2°F | Ht 71.0 in | Wt 222.2 lb

## 2023-07-22 DIAGNOSIS — Z87891 Personal history of nicotine dependence: Secondary | ICD-10-CM | POA: Diagnosis not present

## 2023-07-22 DIAGNOSIS — K5900 Constipation, unspecified: Secondary | ICD-10-CM | POA: Diagnosis not present

## 2023-07-22 DIAGNOSIS — Z85048 Personal history of other malignant neoplasm of rectum, rectosigmoid junction, and anus: Secondary | ICD-10-CM | POA: Diagnosis not present

## 2023-07-22 DIAGNOSIS — K5904 Chronic idiopathic constipation: Secondary | ICD-10-CM

## 2023-07-22 DIAGNOSIS — C20 Malignant neoplasm of rectum: Secondary | ICD-10-CM

## 2023-07-22 DIAGNOSIS — F1091 Alcohol use, unspecified, in remission: Secondary | ICD-10-CM

## 2023-07-22 NOTE — Patient Instructions (Signed)
 Schedule colonoscopy Start Benefiber fiber supplements daily to increase stool bulk or can restart using Ex-Lax daily Stop Metamucil

## 2023-07-22 NOTE — H&P (View-Only) (Signed)
 Raymond Malone, M.D. Gastroenterology & Hepatology [facility] [address] [place], [place] [address]  Primary Care Physician: Gilmore Laroche, FNP [address] [place] [address]  I will communicate my assessment and recommendations to the referring MD via EMR.  Problems: Stage II (T3N0) rectal adenocarcinoma status post FOLFOX, Xeloda and radiation therapy Chronic idiopathic constipation  History of Present Illness: Raymond Malone is a 48 y/o M with PMH Stage II (T3N0) rectal adenocarcinoma status post FOLFOX, Xeloda and radiation therapy, GERD, diabetes, hyperlipidemia, hypertension, coming for follow-up of rectal cancer.   Patient was found to have rectal cancer during screening colonoscopy in 2023.  He was started on chemoradiation and after evaluation by colorectal surgery, decision to hold off on surgical management was held as he had great response to chemoradiation.  Last MRI of the pelvis showed focal thickening of the mid rectum which has been stable compared to prior.  He is due for repeat MRI and CT of the chest, abdomen and pelvis in April 2025.  Last CEA on 03/30/2023 was normal at 3.0.  Patient reports that he has constant constipation. He states that he feels that as long as he takes Metamucil, he is having bowel movements daily. However, sometimes he runs out of Metamucil an constipation recurs. He also reports that he feels he has not "emptied out completely despite having a BM daily". Sometimes may have some pain when he has not had a BM. Had good control of bowels on Ex-Lax in the past but was told by a friend he should not take this chronically.  The patient denies having any nausea, vomiting, fever, chills, hematochezia, melena, hematemesis, diarrhea, jaundice, pruritus or weight loss.  Last Colonoscopy: 01/06/2022 - Two 3 to 5 mm polyps in the descending colon and in the ascending colon, removed with a cold snare.  Resected and retrieved. - Diverticulosis in the descending colon and in the ascending colon. - Likely malignant tumor at 8 cm proximal to the anus. Biopsied. Injected. - The distal rectum and anal verge are normal on retroflexion view.  Last flexible sigmoidoscopy 01/07/2023 The perianal and digital rectal examinations were normal. A 10 mm healthy appearing scar with reactive slightly nodular edges was found at 8 cm proximal to the anus. Biopsies were taken with a cold forceps for histology. A tattoo was seen in the rectum, 1 cm distal to the scar site. The tattoo site appeared normal. Non- bleeding internal hemorrhoids were found during retroflexion. The hemorrhoids were small.  Path: A. RECTUM, BIOPSY:       Colonic mucosa with increased lamina propria inflammatory component  and non-specific crypt architectural disarray.       Negative for dysplasia or malignancy.   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 diabetes mellitus (HCC) 10/06/2016   Hypertension    MRSA (methicillin resistant Staphylococcus aureus)    Skin abscess    Type 2 diabetes mellitus (HCC)     Past Surgical History: Past Surgical History:  Procedure Laterality Date   ANKLE SURGERY     BIOPSY  01/06/2022   Procedure: BIOPSY;  Surgeon: Dolores Frame, MD;  [facility];  Service: Gastroenterology;;   BIOPSY  07/16/2022  Procedure: BIOPSY;  Surgeon: Dolores Frame, MD;  [facility];  Service: Gastroenterology;;   BIOPSY  01/07/2023   Procedure: BIOPSY;  Surgeon: Dolores Frame, MD;  [facility];  Service: Gastroenterology;;   COLONOSCOPY WITH PROPOFOL N/A 01/06/2022   Procedure: COLONOSCOPY  WITH PROPOFOL;  Surgeon: Dolores Frame, MD;  [facility]: [facility];  Service: Gastroenterology;  Laterality: N/A;  730   FLEXIBLE SIGMOIDOSCOPY N/A 07/16/2022   Procedure: FLEXIBLE SIGMOIDOSCOPY;  Surgeon: Dolores Frame, MD;  [facility];  Service: Gastroenterology;  Laterality: N/A;  215pm, asa 1-2   FLEXIBLE SIGMOIDOSCOPY N/A 01/07/2023   Procedure: FLEXIBLE SIGMOIDOSCOPY;  Surgeon: Dolores Frame, MD;  [facility]: [facility];  Service: Gastroenterology;  Laterality: N/A;  10:15AM;ASA 1   HAND SURGERY     boxer's fracture L hand   INCISION AND DRAINAGE PERIRECTAL ABSCESS     IR IMAGING GUIDED PORT INSERTION  02/10/2022   MYRINGOTOMY WITH TUBE PLACEMENT Bilateral 07/22/2020   Procedure: BILATERAL MYRINGOTOMY WITH TUBE PLACEMENT;  Surgeon: Newman Pies, MD;  Location: [facility];  Service: ENT;  Laterality: Bilateral;   POLYPECTOMY  01/06/2022   Procedure: POLYPECTOMY INTESTINAL;  Surgeon: Dolores Frame, MD;  [facility]: [facility];  Service: Gastroenterology;;   SVT ABLATION N/A 04/16/2021   Procedure: SVT ABLATION;  Surgeon: Marinus Maw, MD;  [facility];  Service: Cardiovascular;  Laterality: N/A;    Family History: Family History  Problem Relation Age of Onset   Hypertension Mother     Social History: Social History   Tobacco Use  Smoking Status Former   Current packs/day: 1.00   Average packs/day: 1 pack/day for 29.2 years (29.2 ttl pk-yrs)   Types: Cigarettes   Start date: 05/11/1994  Smokeless Tobacco Never   Social History   Substance and Sexual Activity  Alcohol Use Not Currently   Comment: Occasional   Social History   Substance and Sexual Activity  Drug Use Not Currently   Types: Marijuana    Allergies: No Known Allergies  Medications: Current Outpatient Medications  Medication Sig Dispense Refill   Accu-Chek Softclix Lancets lancets 1 each by Other route 2 (two) times  daily. Use as instructed 100 each 2   blood glucose meter kit and supplies Dispense based on patient and insurance preference. Use up to four times daily as directed. (FOR ICD-10 E10.9, E11.9). 1 each 0   Blood Glucose Monitoring Suppl (ACCU-CHEK GUIDE ME) w/Device KIT Use to test BG bid. E11.65 1 kit 0   gabapentin (NEURONTIN) 300 MG capsule Take 1 capsule (300 mg total) by mouth 3 (three) times daily. 90 capsule 3   glucose blood (ACCU-CHEK GUIDE) test strip USE 1 STRIP TO TEST GLUCOSE TWICE DAILY E11.65 100 each 1   lisinopril-hydrochlorothiazide (ZESTORETIC) 20-25 MG tablet Take 1 tablet by mouth daily. 90 tablet 1   metFORMIN (GLUCOPHAGE) 500 MG tablet Take 1 tablet (500 mg total) by mouth 2 (two) times daily with a meal. 180 tablet 0   pantoprazole (PROTONIX) 40 MG tablet Take 1 tablet (40 mg total) by mouth daily. 30 tablet 0   sildenafil (VIAGRA) 100 MG tablet Take 0.5 tablets (50 mg total) by mouth daily as needed for erectile dysfunction. Erectile dysfunction: recommended to take 1 hour prior to sexual activity; do not take >1 dose per day 20 tablet 1   simvastatin (ZOCOR) 40 MG tablet Take 1 tablet (40 mg total) by mouth  every evening. 90 tablet 0   Testosterone 1.62 % GEL Place 2 Pump onto the skin daily. 75 g 3   Vitamin D, Ergocalciferol, (DRISDOL) 1.25 MG (50000 UNIT) CAPS capsule Take 1 capsule (50,000 Units total) by mouth every 7 (seven) days. 20 capsule 1   dapagliflozin propanediol (FARXIGA) 10 MG TABS tablet Take 1 tablet (10 mg total) by mouth daily. (Patient not taking: Reported on 07/22/2023) 90 tablet 0   docusate sodium (COLACE) 100 MG capsule Take 1 capsule (100 mg total) by mouth 2 (two) times daily. (Patient not taking: Reported on 07/22/2023) 60 capsule 0   hydrocortisone-pramoxine (PROCTOFOAM-HC) rectal foam Place 1 applicator rectally 2 (two) times daily. (Patient not taking: Reported on 07/22/2023) 10 g 3   lactulose (CHRONULAC) 10 GM/15ML solution Take 15 mLs (10 g  total) by mouth at bedtime. If Colace is not working or 3 days w/o BM Take 15mL at bedtime every night to assist with regular bowel movements. Titrate down if having multiple bowel movements. If a bowel movement has not occurred in 3-4 days or longer take 15mL every 3 hours until a bowel movement has occurred. (Patient not taking: Reported on 07/22/2023) 236 mL 1   senna (SENOKOT) 8.6 MG TABS tablet Take 1 tablet (8.6 mg total) by mouth daily. (Patient not taking: Reported on 07/22/2023) 120 tablet 0   tadalafil (CIALIS) 20 MG tablet Take 1 tablet (20 mg total) by mouth as needed. (Patient not taking: Reported on 07/22/2023) 30 tablet 5   No current facility-administered medications for this visit.    Review of Systems: GENERAL: negative for malaise, night sweats HEENT: No changes in hearing or vision, no nose bleeds or other nasal problems. NECK: Negative for lumps, goiter, pain and significant neck swelling RESPIRATORY: Negative for cough, wheezing CARDIOVASCULAR: Negative for chest pain, leg swelling, palpitations, orthopnea GI: SEE HPI MUSCULOSKELETAL: Negative for joint pain or swelling, back pain, and muscle pain. SKIN: Negative for lesions, rash PSYCH: Negative for sleep disturbance, mood disorder and recent psychosocial stressors. HEMATOLOGY Negative for prolonged bleeding, bruising easily, and swollen nodes. ENDOCRINE: Negative for cold or heat intolerance, polyuria, polydipsia and goiter. NEURO: negative for tremor, gait imbalance, syncope and seizures. The remainder of the review of systems is noncontributory.   Physical Exam: BP 121/82   Pulse (!) 108   Temp 98.2 F (36.8 C)   Ht 5\' 11"  (1.803 m)   Wt 222 lb 3.2 oz (100.8 kg)   BMI 30.99 kg/m  GENERAL: The patient is AO x3, in no acute distress. HEENT: Head is normocephalic and atraumatic. EOMI are intact. Mouth is well hydrated and without lesions. NECK: Supple. No masses LUNGS: Clear to auscultation. No presence of  rhonchi/wheezing/rales. Adequate chest expansion HEART: RRR, normal s1 and s2. ABDOMEN: Soft, nontender, no guarding, no peritoneal signs, and nondistended. BS +. No masses. EXTREMITIES: Without any cyanosis, clubbing, rash, lesions or edema. NEUROLOGIC: AOx3, no focal motor deficit. SKIN: no jaundice, no rashes  Imaging/Labs: as above  I personally reviewed and interpreted the available labs, imaging and endoscopic files.  Impression and Plan: KINGSLEY FARACE is a 48 y/o M with PMH Stage II (T3N0) rectal adenocarcinoma status post FOLFOX, Xeloda and radiation therapy, GERD, diabetes, hyperlipidemia, hypertension, coming for follow-up of rectal cancer.   Patient was treated nonsurgically with chemoradiation and has had adequate response as most recent flexible sigmoidoscopy did not show recurrence of rectal cancer.  He is due for colorectal cancer surveillance with a colonoscopy, we will  schedule this today.  Patient has presented some issues with constipation intermittently which he has managed in the past with senna and fiber supplementation.  He stopped senna in the past but he actually was having best symptom improvement with this.  I encouraged him to restart taking senna daily or using Benefiber instead of Metamucil.  -Schedule colonoscopy -Start Benefiber fiber supplements daily to increase stool bulk or can restart using Ex-Lax daily -Stop Metamucil  All questions were answered.      Raymond Blazing, MD Gastroenterology and Hepatology [facility]

## 2023-07-22 NOTE — Progress Notes (Signed)
 Katrinka Blazing, M.D. Gastroenterology & Hepatology [facility] [address] [place], [place] [address]  Primary Care Physician: Gilmore Laroche, FNP [address] [place] [address]  I will communicate my assessment and recommendations to the referring MD via EMR.  Problems: Stage II (T3N0) rectal adenocarcinoma status post FOLFOX, Xeloda and radiation therapy Chronic idiopathic constipation  History of Present Illness: Raymond Malone is a 48 y/o M with PMH Stage II (T3N0) rectal adenocarcinoma status post FOLFOX, Xeloda and radiation therapy, GERD, diabetes, hyperlipidemia, hypertension, coming for follow-up of rectal cancer.   Patient was found to have rectal cancer during screening colonoscopy in 2023.  He was started on chemoradiation and after evaluation by colorectal surgery, decision to hold off on surgical management was held as he had great response to chemoradiation.  Last MRI of the pelvis showed focal thickening of the mid rectum which has been stable compared to prior.  He is due for repeat MRI and CT of the chest, abdomen and pelvis in April 2025.  Last CEA on 03/30/2023 was normal at 3.0.  Patient reports that he has constant constipation. He states that he feels that as long as he takes Metamucil, he is having bowel movements daily. However, sometimes he runs out of Metamucil an constipation recurs. He also reports that he feels he has not "emptied out completely despite having a BM daily". Sometimes may have some pain when he has not had a BM. Had good control of bowels on Ex-Lax in the past but was told by a friend he should not take this chronically.  The patient denies having any nausea, vomiting, fever, chills, hematochezia, melena, hematemesis, diarrhea, jaundice, pruritus or weight loss.  Last Colonoscopy: 01/06/2022 - Two 3 to 5 mm polyps in the descending colon and in the ascending colon, removed with a cold snare.  Resected and retrieved. - Diverticulosis in the descending colon and in the ascending colon. - Likely malignant tumor at 8 cm proximal to the anus. Biopsied. Injected. - The distal rectum and anal verge are normal on retroflexion view.  Last flexible sigmoidoscopy 01/07/2023 The perianal and digital rectal examinations were normal. A 10 mm healthy appearing scar with reactive slightly nodular edges was found at 8 cm proximal to the anus. Biopsies were taken with a cold forceps for histology. A tattoo was seen in the rectum, 1 cm distal to the scar site. The tattoo site appeared normal. Non- bleeding internal hemorrhoids were found during retroflexion. The hemorrhoids were small.  Path: A. RECTUM, BIOPSY:       Colonic mucosa with increased lamina propria inflammatory component  and non-specific crypt architectural disarray.       Negative for dysplasia or malignancy.   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 diabetes mellitus (HCC) 10/06/2016   Hypertension    MRSA (methicillin resistant Staphylococcus aureus)    Skin abscess    Type 2 diabetes mellitus (HCC)     Past Surgical History: Past Surgical History:  Procedure Laterality Date   ANKLE SURGERY     BIOPSY  01/06/2022   Procedure: BIOPSY;  Surgeon: Dolores Frame, MD;  [facility]: [facility];  Service: Gastroenterology;;   BIOPSY  07/16/2022  Procedure: BIOPSY;  Surgeon: Dolores Frame, MD;  [facility]: [facility];  Service: Gastroenterology;;   BIOPSY  01/07/2023   Procedure: BIOPSY;  Surgeon: Dolores Frame, MD;  [facility]: [facility];  Service: Gastroenterology;;   COLONOSCOPY WITH PROPOFOL N/A 01/06/2022   Procedure: COLONOSCOPY  WITH PROPOFOL;  Surgeon: Dolores Frame, MD;  Location: [facility];  Service: Gastroenterology;  Laterality: N/A;  730   FLEXIBLE SIGMOIDOSCOPY N/A 07/16/2022   Procedure: FLEXIBLE SIGMOIDOSCOPY;  Surgeon: Dolores Frame, MD;  Location: [facility];  Service: Gastroenterology;  Laterality: N/A;  215pm, asa 1-2   FLEXIBLE SIGMOIDOSCOPY N/A 01/07/2023   Procedure: FLEXIBLE SIGMOIDOSCOPY;  Surgeon: Dolores Frame, MD;  [facility]: [facility];  Service: Gastroenterology;  Laterality: N/A;  10:15AM;ASA 1   HAND SURGERY     boxer's fracture L hand   INCISION AND DRAINAGE PERIRECTAL ABSCESS     IR IMAGING GUIDED PORT INSERTION  02/10/2022   MYRINGOTOMY WITH TUBE PLACEMENT Bilateral 07/22/2020   Procedure: BILATERAL MYRINGOTOMY WITH TUBE PLACEMENT;  Surgeon: Newman Pies, MD;  Location: [facility];  Service: ENT;  Laterality: Bilateral;   POLYPECTOMY  01/06/2022   Procedure: POLYPECTOMY INTESTINAL;  Surgeon: Dolores Frame, MD;  [facility]: [facility];  Service: Gastroenterology;;   SVT ABLATION N/A 04/16/2021   Procedure: SVT ABLATION;  Surgeon: Marinus Maw, MD;  [facility];  Service: Cardiovascular;  Laterality: N/A;    Family History: Family History  Problem Relation Age of Onset   Hypertension Mother     Social History: Social History   Tobacco Use  Smoking Status Former   Current packs/day: 1.00   Average packs/day: 1 pack/day for 29.2 years (29.2 ttl pk-yrs)   Types: Cigarettes   Start date: 05/11/1994  Smokeless Tobacco Never   Social History   Substance and Sexual Activity  Alcohol Use Not Currently   Comment: Occasional   Social History   Substance and Sexual Activity  Drug Use Not Currently   Types: Marijuana    Allergies: No Known Allergies  Medications: Current Outpatient Medications  Medication Sig Dispense Refill   Accu-Chek Softclix Lancets lancets 1 each by Other route 2 (two) times  daily. Use as instructed 100 each 2   blood glucose meter kit and supplies Dispense based on patient and insurance preference. Use up to four times daily as directed. (FOR ICD-10 E10.9, E11.9). 1 each 0   Blood Glucose Monitoring Suppl (ACCU-CHEK GUIDE ME) w/Device KIT Use to test BG bid. E11.65 1 kit 0   gabapentin (NEURONTIN) 300 MG capsule Take 1 capsule (300 mg total) by mouth 3 (three) times daily. 90 capsule 3   glucose blood (ACCU-CHEK GUIDE) test strip USE 1 STRIP TO TEST GLUCOSE TWICE DAILY E11.65 100 each 1   lisinopril-hydrochlorothiazide (ZESTORETIC) 20-25 MG tablet Take 1 tablet by mouth daily. 90 tablet 1   metFORMIN (GLUCOPHAGE) 500 MG tablet Take 1 tablet (500 mg total) by mouth 2 (two) times daily with a meal. 180 tablet 0   pantoprazole (PROTONIX) 40 MG tablet Take 1 tablet (40 mg total) by mouth daily. 30 tablet 0   sildenafil (VIAGRA) 100 MG tablet Take 0.5 tablets (50 mg total) by mouth daily as needed for erectile dysfunction. Erectile dysfunction: recommended to take 1 hour prior to sexual activity; do not take >1 dose per day 20 tablet 1   simvastatin (ZOCOR) 40 MG tablet Take 1 tablet (40 mg total) by mouth  every evening. 90 tablet 0   Testosterone 1.62 % GEL Place 2 Pump onto the skin daily. 75 g 3   Vitamin D, Ergocalciferol, (DRISDOL) 1.25 MG (50000 UNIT) CAPS capsule Take 1 capsule (50,000 Units total) by mouth every 7 (seven) days. 20 capsule 1   dapagliflozin propanediol (FARXIGA) 10 MG TABS tablet Take 1 tablet (10 mg total) by mouth daily. (Patient not taking: Reported on 07/22/2023) 90 tablet 0   docusate sodium (COLACE) 100 MG capsule Take 1 capsule (100 mg total) by mouth 2 (two) times daily. (Patient not taking: Reported on 07/22/2023) 60 capsule 0   hydrocortisone-pramoxine (PROCTOFOAM-HC) rectal foam Place 1 applicator rectally 2 (two) times daily. (Patient not taking: Reported on 07/22/2023) 10 g 3   lactulose (CHRONULAC) 10 GM/15ML solution Take 15 mLs (10 g  total) by mouth at bedtime. If Colace is not working or 3 days w/o BM Take 15mL at bedtime every night to assist with regular bowel movements. Titrate down if having multiple bowel movements. If a bowel movement has not occurred in 3-4 days or longer take 15mL every 3 hours until a bowel movement has occurred. (Patient not taking: Reported on 07/22/2023) 236 mL 1   senna (SENOKOT) 8.6 MG TABS tablet Take 1 tablet (8.6 mg total) by mouth daily. (Patient not taking: Reported on 07/22/2023) 120 tablet 0   tadalafil (CIALIS) 20 MG tablet Take 1 tablet (20 mg total) by mouth as needed. (Patient not taking: Reported on 07/22/2023) 30 tablet 5   No current facility-administered medications for this visit.    Review of Systems: GENERAL: negative for malaise, night sweats HEENT: No changes in hearing or vision, no nose bleeds or other nasal problems. NECK: Negative for lumps, goiter, pain and significant neck swelling RESPIRATORY: Negative for cough, wheezing CARDIOVASCULAR: Negative for chest pain, leg swelling, palpitations, orthopnea GI: SEE HPI MUSCULOSKELETAL: Negative for joint pain or swelling, back pain, and muscle pain. SKIN: Negative for lesions, rash PSYCH: Negative for sleep disturbance, mood disorder and recent psychosocial stressors. HEMATOLOGY Negative for prolonged bleeding, bruising easily, and swollen nodes. ENDOCRINE: Negative for cold or heat intolerance, polyuria, polydipsia and goiter. NEURO: negative for tremor, gait imbalance, syncope and seizures. The remainder of the review of systems is noncontributory.   Physical Exam: BP 121/82   Pulse (!) 108   Temp 98.2 F (36.8 C)   Ht 5\' 11"  (1.803 m)   Wt 222 lb 3.2 oz (100.8 kg)   BMI 30.99 kg/m  GENERAL: The patient is AO x3, in no acute distress. HEENT: Head is normocephalic and atraumatic. EOMI are intact. Mouth is well hydrated and without lesions. NECK: Supple. No masses LUNGS: Clear to auscultation. No presence of  rhonchi/wheezing/rales. Adequate chest expansion HEART: RRR, normal s1 and s2. ABDOMEN: Soft, nontender, no guarding, no peritoneal signs, and nondistended. BS +. No masses. EXTREMITIES: Without any cyanosis, clubbing, rash, lesions or edema. NEUROLOGIC: AOx3, no focal motor deficit. SKIN: no jaundice, no rashes  Imaging/Labs: as above  I personally reviewed and interpreted the available labs, imaging and endoscopic files.  Impression and Plan: KINGSLEY FARACE is a 48 y/o M with PMH Stage II (T3N0) rectal adenocarcinoma status post FOLFOX, Xeloda and radiation therapy, GERD, diabetes, hyperlipidemia, hypertension, coming for follow-up of rectal cancer.   Patient was treated nonsurgically with chemoradiation and has had adequate response as most recent flexible sigmoidoscopy did not show recurrence of rectal cancer.  He is due for colorectal cancer surveillance with a colonoscopy, we will  schedule this today.  Patient has presented some issues with constipation intermittently which he has managed in the past with senna and fiber supplementation.  He stopped senna in the past but he actually was having best symptom improvement with this.  I encouraged him to restart taking senna daily or using Benefiber instead of Metamucil.  -Schedule colonoscopy -Start Benefiber fiber supplements daily to increase stool bulk or can restart using Ex-Lax daily -Stop Metamucil  All questions were answered.      Katrinka Blazing, MD Gastroenterology and Hepatology [facility] Gastroenterology

## 2023-07-24 ENCOUNTER — Other Ambulatory Visit: Payer: Self-pay | Admitting: Family Medicine

## 2023-07-24 DIAGNOSIS — E1140 Type 2 diabetes mellitus with diabetic neuropathy, unspecified: Secondary | ICD-10-CM

## 2023-07-24 MED ORDER — VITAMIN B-6 50 MG PO TABS
50.0000 mg | ORAL_TABLET | Freq: Every day | ORAL | 1 refills | Status: DC
Start: 2023-07-24 — End: 2023-07-26

## 2023-07-26 ENCOUNTER — Encounter: Payer: Self-pay | Admitting: Family Medicine

## 2023-07-26 ENCOUNTER — Telehealth (INDEPENDENT_AMBULATORY_CARE_PROVIDER_SITE_OTHER): Admitting: Family Medicine

## 2023-07-26 VITALS — Ht 71.0 in | Wt 222.0 lb

## 2023-07-26 DIAGNOSIS — H11153 Pinguecula, bilateral: Secondary | ICD-10-CM | POA: Diagnosis not present

## 2023-07-26 DIAGNOSIS — E1140 Type 2 diabetes mellitus with diabetic neuropathy, unspecified: Secondary | ICD-10-CM | POA: Diagnosis not present

## 2023-07-26 DIAGNOSIS — E559 Vitamin D deficiency, unspecified: Secondary | ICD-10-CM | POA: Diagnosis not present

## 2023-07-26 DIAGNOSIS — H40013 Open angle with borderline findings, low risk, bilateral: Secondary | ICD-10-CM | POA: Diagnosis not present

## 2023-07-26 DIAGNOSIS — E119 Type 2 diabetes mellitus without complications: Secondary | ICD-10-CM | POA: Diagnosis not present

## 2023-07-26 DIAGNOSIS — H5201 Hypermetropia, right eye: Secondary | ICD-10-CM | POA: Diagnosis not present

## 2023-07-26 DIAGNOSIS — E785 Hyperlipidemia, unspecified: Secondary | ICD-10-CM

## 2023-07-26 DIAGNOSIS — K219 Gastro-esophageal reflux disease without esophagitis: Secondary | ICD-10-CM

## 2023-07-26 DIAGNOSIS — Z7984 Long term (current) use of oral hypoglycemic drugs: Secondary | ICD-10-CM | POA: Diagnosis not present

## 2023-07-26 DIAGNOSIS — E1165 Type 2 diabetes mellitus with hyperglycemia: Secondary | ICD-10-CM | POA: Diagnosis not present

## 2023-07-26 DIAGNOSIS — Z794 Long term (current) use of insulin: Secondary | ICD-10-CM

## 2023-07-26 DIAGNOSIS — H524 Presbyopia: Secondary | ICD-10-CM | POA: Diagnosis not present

## 2023-07-26 MED ORDER — DAPAGLIFLOZIN PROPANEDIOL 10 MG PO TABS: 10.0000 mg | ORAL_TABLET | Freq: Every day | ORAL | 0 refills | Status: DC

## 2023-07-26 MED ORDER — PANTOPRAZOLE SODIUM 40 MG PO TBEC: 40.0000 mg | DELAYED_RELEASE_TABLET | Freq: Every day | ORAL | 0 refills | Status: DC

## 2023-07-26 MED ORDER — VITAMIN B-6 50 MG PO TABS: 50.0000 mg | ORAL_TABLET | Freq: Every day | ORAL | 1 refills | Status: DC

## 2023-07-26 MED ORDER — SIMVASTATIN 40 MG PO TABS: 40.0000 mg | ORAL_TABLET | Freq: Every evening | ORAL | 0 refills | Status: DC

## 2023-07-26 MED ORDER — METFORMIN HCL 500 MG PO TABS: 500.0000 mg | ORAL_TABLET | Freq: Two times a day (BID) | ORAL | 0 refills | Status: DC

## 2023-07-26 MED ORDER — VITAMIN D (ERGOCALCIFEROL) 1.25 MG (50000 UNIT) PO CAPS
50000.0000 [IU] | ORAL_CAPSULE | ORAL | 1 refills | Status: DC
Start: 2023-07-26 — End: 2023-07-26

## 2023-07-26 MED ORDER — VITAMIN D (ERGOCALCIFEROL) 1.25 MG (50000 UNIT) PO CAPS: 50000.0000 [IU] | ORAL_CAPSULE | ORAL | 1 refills | Status: DC

## 2023-07-26 MED ORDER — PEG 3350-KCL-NA BICARB-NACL 420 G PO SOLR: 4000.0000 mL | Freq: Once | ORAL | 0 refills | Status: AC

## 2023-07-26 NOTE — Progress Notes (Signed)
 Virtual Visit via Video Note  I connected with Raymond Malone on 07/26/23 at  1:40 PM EDT by a video enabled telemedicine application and verified that I am speaking with the correct person using two identifiers.  Patient Location: Home Provider Location: Office/Clinic  I discussed the limitations, risks, security, and privacy concerns of performing an evaluation and management service by video and the availability of in person appointments. I also discussed with the patient that there may be a patient responsible charge related to this service. The patient expressed understanding and agreed to proceed.  Subjective: PCP: Gilmore Laroche, FNP  Chief Complaint  Patient presents with   Medication Refill   HPI The patient is seen today requesting medications refills. No concerns voiced.  ROS: Per HPI  Current Outpatient Medications:    Accu-Chek Softclix Lancets lancets, 1 each by Other route 2 (two) times daily. Use as instructed, Disp: 100 each, Rfl: 2   blood glucose meter kit and supplies, Dispense based on patient and insurance preference. Use up to four times daily as directed. (FOR ICD-10 E10.9, E11.9)., Disp: 1 each, Rfl: 0   Blood Glucose Monitoring Suppl (ACCU-CHEK GUIDE ME) w/Device KIT, Use to test BG bid. E11.65, Disp: 1 kit, Rfl: 0   gabapentin (NEURONTIN) 300 MG capsule, Take 1 capsule (300 mg total) by mouth 3 (three) times daily., Disp: 90 capsule, Rfl: 3   glucose blood (ACCU-CHEK GUIDE) test strip, USE 1 STRIP TO TEST GLUCOSE TWICE DAILY E11.65, Disp: 100 each, Rfl: 1   lisinopril-hydrochlorothiazide (ZESTORETIC) 20-25 MG tablet, Take 1 tablet by mouth daily., Disp: 90 tablet, Rfl: 1   polyethylene glycol-electrolytes (NULYTELY) 420 g solution, Take 4,000 mLs by mouth once for 1 dose., Disp: 4000 mL, Rfl: 0   sildenafil (VIAGRA) 100 MG tablet, Take 0.5 tablets (50 mg total) by mouth daily as needed for erectile dysfunction. Erectile dysfunction: recommended to take 1  hour prior to sexual activity; do not take >1 dose per day, Disp: 20 tablet, Rfl: 1   Testosterone 1.62 % GEL, Place 2 Pump onto the skin daily., Disp: 75 g, Rfl: 3   dapagliflozin propanediol (FARXIGA) 10 MG TABS tablet, Take 1 tablet (10 mg total) by mouth daily., Disp: 90 tablet, Rfl: 0   docusate sodium (COLACE) 100 MG capsule, Take 1 capsule (100 mg total) by mouth 2 (two) times daily. (Patient not taking: Reported on 07/26/2023), Disp: 60 capsule, Rfl: 0   hydrocortisone-pramoxine (PROCTOFOAM-HC) rectal foam, Place 1 applicator rectally 2 (two) times daily. (Patient not taking: Reported on 07/26/2023), Disp: 10 g, Rfl: 3   lactulose (CHRONULAC) 10 GM/15ML solution, Take 15 mLs (10 g total) by mouth at bedtime. If Colace is not working or 3 days w/o BM Take 15mL at bedtime every night to assist with regular bowel movements. Titrate down if having multiple bowel movements. If a bowel movement has not occurred in 3-4 days or longer take 15mL every 3 hours until a bowel movement has occurred. (Patient not taking: Reported on 07/26/2023), Disp: 236 mL, Rfl: 1   metFORMIN (GLUCOPHAGE) 500 MG tablet, Take 1 tablet (500 mg total) by mouth 2 (two) times daily with a meal., Disp: 180 tablet, Rfl: 0   pantoprazole (PROTONIX) 40 MG tablet, Take 1 tablet (40 mg total) by mouth daily., Disp: 30 tablet, Rfl: 0   pyridOXINE (VITAMIN B6) 50 MG tablet, Take 1 tablet (50 mg total) by mouth daily., Disp: 30 tablet, Rfl: 1   senna (SENOKOT) 8.6 MG TABS  tablet, Take 1 tablet (8.6 mg total) by mouth daily. (Patient not taking: Reported on 07/26/2023), Disp: 120 tablet, Rfl: 0   simvastatin (ZOCOR) 40 MG tablet, Take 1 tablet (40 mg total) by mouth every evening., Disp: 90 tablet, Rfl: 0   tadalafil (CIALIS) 20 MG tablet, Take 1 tablet (20 mg total) by mouth as needed. (Patient not taking: Reported on 07/26/2023), Disp: 30 tablet, Rfl: 5   Vitamin D, Ergocalciferol, (DRISDOL) 1.25 MG (50000 UNIT) CAPS capsule, Take 1 capsule  (50,000 Units total) by mouth every 7 (seven) days., Disp: 20 capsule, Rfl: 1  Observations/Objective: Today's Vitals   07/26/23 1346  Weight: 222 lb (100.7 kg)  Height: 5\' 11"  (1.803 m)  PainSc: 0-No pain   Physical Exam Patient is well-developed, well-nourished in no acute distress.  Resting comfortably at home.  Head is normocephalic, atraumatic.  No labored breathing.  Speech is clear and coherent with logical content.  Patient is alert and oriented at baseline.   Assessment and Plan: Type 2 diabetes mellitus without complication, with long-term current use of insulin (HCC) -     metFORMIN HCl; Take 1 tablet (500 mg total) by mouth 2 (two) times daily with a meal.  Dispense: 180 tablet; Refill: 0 -     Simvastatin; Take 1 tablet (40 mg total) by mouth every evening.  Dispense: 90 tablet; Refill: 0  Gastroesophageal reflux disease without esophagitis -     Pantoprazole Sodium; Take 1 tablet (40 mg total) by mouth daily.  Dispense: 30 tablet; Refill: 0  Type 2 diabetes mellitus with diabetic neuropathy, without long-term current use of insulin (HCC) -     Vitamin B-6; Take 1 tablet (50 mg total) by mouth daily.  Dispense: 30 tablet; Refill: 1  Hyperlipidemia, unspecified hyperlipidemia type -     Simvastatin; Take 1 tablet (40 mg total) by mouth every evening.  Dispense: 90 tablet; Refill: 0  Vitamin D deficiency -     Vitamin D (Ergocalciferol); Take 1 capsule (50,000 Units total) by mouth every 7 (seven) days.  Dispense: 20 capsule; Refill: 1  Type 2 diabetes mellitus with hyperglycemia, without long-term current use of insulin (HCC) -     Dapagliflozin Propanediol; Take 1 tablet (10 mg total) by mouth daily.  Dispense: 90 tablet; Refill: 0   Note: This chart has been completed using Engineer, civil (consulting) software, and while attempts have been made to ensure accuracy, certain words and phrases may not be transcribed as intended.    Follow Up Instructions: No follow-ups  on file.   I discussed the assessment and treatment plan with the patient. The patient was provided an opportunity to ask questions, and all were answered. The patient agreed with the plan and demonstrated an understanding of the instructions.   The patient was advised to call back or seek an in-person evaluation if the symptoms worsen or if the condition fails to improve as anticipated.  The above assessment and management plan was discussed with the patient. The patient verbalized understanding of and has agreed to the management plan.   Gilmore Laroche, FNP

## 2023-07-26 NOTE — Addendum Note (Signed)
 Addended by: Marlowe Shores on: 07/26/2023 09:10 AM   Modules accepted: Orders

## 2023-07-27 ENCOUNTER — Telehealth: Payer: Self-pay | Admitting: Pharmacy Technician

## 2023-07-27 ENCOUNTER — Other Ambulatory Visit (HOSPITAL_COMMUNITY): Payer: Self-pay

## 2023-07-27 ENCOUNTER — Encounter: Payer: Self-pay | Admitting: Hematology

## 2023-07-27 NOTE — Telephone Encounter (Signed)
 Pharmacy Patient Advocate Encounter   Received notification from Patient Pharmacy that prior authorization for FARXIGA 10MG  TABLETS is required/requested.   Insurance verification completed.   The patient is insured through [facility] MEDICAID .   Per test claim: PA required; PA submitted to above mentioned insurance via CoverMyMeds Key/confirmation #/EOC [id number] Status is pending

## 2023-07-27 NOTE — Telephone Encounter (Signed)
 Pharmacy Patient Advocate Encounter  Received notification from [facility] MEDICAID that Prior Authorization for FARXIGA 10MG  TABLETS has been APPROVED from 07/27/2023 to 07/26/2024. Ran test claim, Copay is $4.00. This test claim was processed through [facility]- copay amounts may vary at other pharmacies due to pharmacy/plan contracts, or as the patient moves through the different stages of their insurance plan.   PA #/Case ID/Reference #: [id number]

## 2023-07-30 ENCOUNTER — Ambulatory Visit (INDEPENDENT_AMBULATORY_CARE_PROVIDER_SITE_OTHER): Payer: Medicaid Other | Admitting: Urology

## 2023-07-30 VITALS — BP 128/87 | HR 86

## 2023-07-30 DIAGNOSIS — E291 Testicular hypofunction: Secondary | ICD-10-CM | POA: Diagnosis not present

## 2023-07-30 DIAGNOSIS — N401 Enlarged prostate with lower urinary tract symptoms: Secondary | ICD-10-CM

## 2023-07-30 DIAGNOSIS — R3912 Poor urinary stream: Secondary | ICD-10-CM | POA: Diagnosis not present

## 2023-07-30 MED ORDER — ALFUZOSIN HCL ER 10 MG PO TB24: 10.0000 mg | ORAL_TABLET | Freq: Every day | ORAL | 11 refills | Status: AC

## 2023-07-30 NOTE — Progress Notes (Signed)
 07/30/2023 9:45 AM   Raymond Malone [birth year] [id number]  Referring provider: Gilmore Laroche, FNP [address] [place],  [place] [address]  Weak urinary stream   HPI: Raymond Malone is a 48yo here for followup for hypogonadism and for evaluation of nocturia. Testosterone 156. He has worsening fatigue and decreased libido. He has difficulty getting and maintaining an erection. He notes worsening nocturia and a weaker stream over the past year. Nocturia 3-4x depending on fluid consumption. IPSS 27 QOL 5 on no therapy. His urine stream is weak. He has intermittent straining to urinate.    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) 10/06/2016   Hypertension    MRSA (methicillin resistant Staphylococcus aureus)    Skin abscess    Type 2 diabetes mellitus (HCC)     Surgical History: Past Surgical History:  Procedure Laterality Date   ANKLE SURGERY     BIOPSY  01/06/2022   Procedure: BIOPSY;  Surgeon: Dolores Frame, MD;  [facility];  Service: Gastroenterology;;   BIOPSY  07/16/2022   Procedure: BIOPSY;  Surgeon: Dolores Frame, MD;  [facility];  Service: Gastroenterology;;   BIOPSY  01/07/2023   Procedure: BIOPSY;  Surgeon: Dolores Frame, MD;  [facility];  Service: Gastroenterology;;   COLONOSCOPY WITH PROPOFOL N/A 01/06/2022   Procedure: COLONOSCOPY WITH PROPOFOL;  Surgeon: Dolores Frame, MD;  [facility];  Service: Gastroenterology;  Laterality: N/A;  730   FLEXIBLE SIGMOIDOSCOPY N/A 07/16/2022   Procedure: FLEXIBLE SIGMOIDOSCOPY;  Surgeon: Dolores Frame, MD;  [facility];  Service: Gastroenterology;   Laterality: N/A;  215pm, asa 1-2   FLEXIBLE SIGMOIDOSCOPY N/A 01/07/2023   Procedure: FLEXIBLE SIGMOIDOSCOPY;  Surgeon: Dolores Frame, MD;  [facility];  Service: Gastroenterology;  Laterality: N/A;  10:15AM;ASA 1   HAND SURGERY     boxer's fracture L hand   INCISION AND DRAINAGE PERIRECTAL ABSCESS     IR IMAGING GUIDED PORT INSERTION  02/10/2022   MYRINGOTOMY WITH TUBE PLACEMENT Bilateral 07/22/2020   Procedure: BILATERAL MYRINGOTOMY WITH TUBE PLACEMENT;  Surgeon: Newman Pies, MD;  Location: [facility];  Service: ENT;  Laterality: Bilateral;   POLYPECTOMY  01/06/2022   Procedure: POLYPECTOMY INTESTINAL;  Surgeon: Dolores Frame, MD;  [facility];  Service: Gastroenterology;;   SVT ABLATION N/A 04/16/2021   Procedure: SVT ABLATION;  Surgeon: Marinus Maw, MD;  [facility];  Service: Cardiovascular;  Laterality: N/A;    Home Medications:  Allergies as of 07/30/2023   No Known Allergies      Medication List        Accurate as of July 30, 2023  9:45 AM. If you have any questions, ask your nurse or doctor.          Accu-Chek Guide Me w/Device Kit Use to test BG bid. E11.65   Accu-Chek Guide test strip Generic drug: glucose blood USE 1 STRIP TO TEST GLUCOSE TWICE DAILY E11.65   Accu-Chek Softclix Lancets lancets 1 each by Other route 2 (two) times daily. Use as instructed   blood glucose meter kit and supplies Dispense based on patient and insurance  preference. Use up to four times daily as directed. (FOR ICD-10 E10.9, E11.9).   dapagliflozin propanediol 10 MG Tabs tablet Commonly known as: FARXIGA Take 1 tablet (10 mg total) by mouth daily.   docusate sodium 100 MG capsule Commonly known as: Colace Take 1 capsule (100 mg total) by mouth 2 (two) times daily.   gabapentin 300 MG capsule Commonly known as: NEURONTIN Take 1 capsule (300 mg total) by mouth 3 (three) times daily.    hydrocortisone-pramoxine rectal foam Commonly known as: PROCTOFOAM-HC Place 1 applicator rectally 2 (two) times daily.   lactulose 10 GM/15ML solution Commonly known as: CHRONULAC Take 15 mLs (10 g total) by mouth at bedtime. If Colace is not working or 3 days w/o BM Take 15mL at bedtime every night to assist with regular bowel movements. Titrate down if having multiple bowel movements. If a bowel movement has not occurred in 3-4 days or longer take 15mL every 3 hours until a bowel movement has occurred.   lisinopril-hydrochlorothiazide 20-25 MG tablet Commonly known as: ZESTORETIC Take 1 tablet by mouth daily.   metFORMIN 500 MG tablet Commonly known as: GLUCOPHAGE Take 1 tablet (500 mg total) by mouth 2 (two) times daily with a meal.   pantoprazole 40 MG tablet Commonly known as: PROTONIX Take 1 tablet (40 mg total) by mouth daily.   pyridOXINE 50 MG tablet Commonly known as: VITAMIN B6 Take 1 tablet (50 mg total) by mouth daily.   senna 8.6 MG Tabs tablet Commonly known as: SENOKOT Take 1 tablet (8.6 mg total) by mouth daily.   sildenafil 100 MG tablet Commonly known as: Viagra Take 0.5 tablets (50 mg total) by mouth daily as needed for erectile dysfunction. Erectile dysfunction: recommended to take 1 hour prior to sexual activity; do not take >1 dose per day   simvastatin 40 MG tablet Commonly known as: ZOCOR Take 1 tablet (40 mg total) by mouth every evening.   tadalafil 20 MG tablet Commonly known as: CIALIS Take 1 tablet (20 mg total) by mouth as needed.   Testosterone 1.62 % Gel Place 2 Pump onto the skin daily.   Vitamin D (Ergocalciferol) 1.25 MG (50000 UNIT) Caps capsule Commonly known as: DRISDOL Take 1 capsule (50,000 Units total) by mouth every 7 (seven) days.        Allergies: No Known Allergies  Family History: Family History  Problem Relation Age of Onset   Hypertension Mother     Social History:  reports that he has quit smoking. His  smoking use included cigarettes. He started smoking about 29 years ago. He has a 29.2 pack-year smoking history. He has never used smokeless tobacco. He reports that he does not currently use alcohol. He reports that he does not currently use drugs after having used the following drugs: Marijuana.  ROS: All other review of systems were reviewed and are negative except what is noted above in HPI  Physical Exam: BP 128/87   Pulse 86   Constitutional:  Alert and oriented, No acute distress. HEENT: [place] AT, moist mucus membranes.  Trachea midline, no masses. Cardiovascular: No clubbing, cyanosis, or edema. Respiratory: Normal respiratory effort, no increased work of breathing. GI: Abdomen is soft, nontender, nondistended, no abdominal masses GU: No CVA tenderness.  Lymph: No cervical or inguinal lymphadenopathy. Skin: No rashes, bruises or suspicious lesions. Neurologic: Grossly intact, no focal deficits, moving all 4 extremities. Psychiatric: Normal mood and affect.  Laboratory Data: Lab Results  Component Value Date   WBC 4.2  06/23/2023   HGB 15.6 06/23/2023   HCT 46.0 06/23/2023   MCV 87 06/23/2023   PLT 325 06/23/2023    Lab Results  Component Value Date   CREATININE 0.96 06/23/2023    No results found for: "PSA"  Lab Results  Component Value Date   TESTOSTERONE 156 (L) 05/24/2023    Lab Results  Component Value Date   HGBA1C 7.0 (H) 06/23/2023    Urinalysis    Component Value Date/Time   COLORURINE YELLOW 10/19/2022 1252   APPEARANCEUR CLEAR 10/19/2022 1252   LABSPEC 1.036 (H) 10/19/2022 1252   PHURINE 5.0 10/19/2022 1252   GLUCOSEU >=500 (A) 10/19/2022 1252   HGBUR NEGATIVE 10/19/2022 1252   BILIRUBINUR NEGATIVE 10/19/2022 1252   BILIRUBINUR negative 12/19/2021 1643   KETONESUR 20 (A) 10/19/2022 1252   PROTEINUR NEGATIVE 10/19/2022 1252   UROBILINOGEN 4.0 (A) 12/19/2021 1643   UROBILINOGEN 1.0 01/24/2015 1630   NITRITE NEGATIVE 10/19/2022 1252    LEUKOCYTESUR NEGATIVE 10/19/2022 1252    Lab Results  Component Value Date   LABMICR 9.3 03/02/2023   BACTERIA NONE SEEN 10/19/2022    Pertinent Imaging:  No results found for this or any previous visit.  No results found for this or any previous visit.  No results found for this or any previous visit.  No results found for this or any previous visit.  No results found for this or any previous visit.  No results found for this or any previous visit.  No results found for this or any previous visit.  No results found for this or any previous visit.   Assessment & Plan:    1. Hypogonadism in male (Primary)  - Testosterone,Free and Total; Future - Testosterone,Free and Total; Future - Comprehensive metabolic panel; Future - CBC; Future - Estradiol; Future - Testosterone,Free and Total   2. BPH with weak stream -uroxatral 10mg    No follow-ups on file.  Wilkie Aye, MD  [facility] [place]

## 2023-08-05 ENCOUNTER — Encounter: Payer: Self-pay | Admitting: Urology

## 2023-08-05 LAB — TESTOSTERONE,FREE AND TOTAL
Testosterone, Free: 10 pg/mL (ref 6.8–21.5)
Testosterone: 399 ng/dL (ref 264–916)

## 2023-08-05 NOTE — Patient Instructions (Signed)

## 2023-08-11 ENCOUNTER — Telehealth: Payer: Self-pay

## 2023-08-11 NOTE — Telephone Encounter (Signed)
 Copied from CRM [id number]. Topic: Referral - Request for Referral >> Aug 11, 2023  9:33 AM [name] D wrote: Did the patient discuss referral with their provider in the last year? sent message on mychart but didn't get response (If No - schedule appointment) (If Yes - send message)  Appointment offered? Yes  Type of order/referral and detailed reason for visit: sleep study thinks he has sleep apnea really bad  Preference of office, provider, location: Where ever the pcp recommends  If referral order, have you been seen by this specialty before? No (If Yes, this issue or another issue? When? Where?  Can we respond through MyChart? Yes

## 2023-08-12 ENCOUNTER — Other Ambulatory Visit: Payer: Self-pay | Admitting: Family Medicine

## 2023-08-12 DIAGNOSIS — R0683 Snoring: Secondary | ICD-10-CM

## 2023-08-12 NOTE — Telephone Encounter (Signed)
 Pt. Notified that sleep study has been ordered and that scheduling should be calling soon.

## 2023-08-18 ENCOUNTER — Ambulatory Visit (HOSPITAL_COMMUNITY): Admitting: Anesthesiology

## 2023-08-18 ENCOUNTER — Encounter (HOSPITAL_COMMUNITY): Payer: Self-pay | Admitting: Gastroenterology

## 2023-08-18 ENCOUNTER — Ambulatory Visit (HOSPITAL_COMMUNITY)
Admission: RE | Admit: 2023-08-18 | Discharge: 2023-08-18 | Disposition: A | Discharge status: Home / Self Care | Attending: Gastroenterology | Admitting: Gastroenterology

## 2023-08-18 ENCOUNTER — Other Ambulatory Visit: Payer: Self-pay

## 2023-08-18 ENCOUNTER — Encounter (HOSPITAL_COMMUNITY)
Admission: RE | Disposition: A | Discharge status: Home / Self Care | Payer: Self-pay | Source: Home / Self Care | Attending: Gastroenterology

## 2023-08-18 ENCOUNTER — Encounter (INDEPENDENT_AMBULATORY_CARE_PROVIDER_SITE_OTHER): Payer: Self-pay | Admitting: *Deleted

## 2023-08-18 DIAGNOSIS — K635 Polyp of colon: Secondary | ICD-10-CM

## 2023-08-18 DIAGNOSIS — Z85048 Personal history of other malignant neoplasm of rectum, rectosigmoid junction, and anus: Secondary | ICD-10-CM | POA: Insufficient documentation

## 2023-08-18 DIAGNOSIS — Z08 Encounter for follow-up examination after completed treatment for malignant neoplasm: Secondary | ICD-10-CM | POA: Insufficient documentation

## 2023-08-18 DIAGNOSIS — E119 Type 2 diabetes mellitus without complications: Secondary | ICD-10-CM | POA: Diagnosis not present

## 2023-08-18 DIAGNOSIS — Z7984 Long term (current) use of oral hypoglycemic drugs: Secondary | ICD-10-CM | POA: Diagnosis not present

## 2023-08-18 DIAGNOSIS — K649 Unspecified hemorrhoids: Secondary | ICD-10-CM

## 2023-08-18 DIAGNOSIS — K5900 Constipation, unspecified: Secondary | ICD-10-CM | POA: Diagnosis not present

## 2023-08-18 DIAGNOSIS — K219 Gastro-esophageal reflux disease without esophagitis: Secondary | ICD-10-CM | POA: Insufficient documentation

## 2023-08-18 DIAGNOSIS — Z87891 Personal history of nicotine dependence: Secondary | ICD-10-CM | POA: Insufficient documentation

## 2023-08-18 DIAGNOSIS — Z9221 Personal history of antineoplastic chemotherapy: Secondary | ICD-10-CM | POA: Insufficient documentation

## 2023-08-18 DIAGNOSIS — K648 Other hemorrhoids: Secondary | ICD-10-CM | POA: Insufficient documentation

## 2023-08-18 DIAGNOSIS — Z923 Personal history of irradiation: Secondary | ICD-10-CM | POA: Insufficient documentation

## 2023-08-18 DIAGNOSIS — Z85038 Personal history of other malignant neoplasm of large intestine: Secondary | ICD-10-CM

## 2023-08-18 DIAGNOSIS — I10 Essential (primary) hypertension: Secondary | ICD-10-CM | POA: Insufficient documentation

## 2023-08-18 DIAGNOSIS — D122 Benign neoplasm of ascending colon: Secondary | ICD-10-CM

## 2023-08-18 DIAGNOSIS — Z1211 Encounter for screening for malignant neoplasm of colon: Secondary | ICD-10-CM | POA: Diagnosis not present

## 2023-08-18 DIAGNOSIS — K6289 Other specified diseases of anus and rectum: Secondary | ICD-10-CM | POA: Diagnosis not present

## 2023-08-18 LAB — HM COLONOSCOPY

## 2023-08-18 LAB — GLUCOSE, CAPILLARY: Glucose-Capillary: 111 mg/dL — ABNORMAL HIGH (ref 70–99)

## 2023-08-18 SURGERY — COLONOSCOPY
Anesthesia: General

## 2023-08-18 MED ORDER — LACTATED RINGERS IV SOLN: INTRAVENOUS | Status: DC

## 2023-08-18 MED ORDER — EPHEDRINE SULFATE (PRESSORS) 50 MG/ML IJ SOLN
INTRAMUSCULAR | Status: DC | PRN
  Administered 2023-08-18: 10 mg via INTRAVENOUS

## 2023-08-18 MED ORDER — SIMETHICONE 40 MG/0.6ML PO SUSP
ORAL | Status: DC | PRN
  Administered 2023-08-18: 120 mL

## 2023-08-18 MED ORDER — LACTATED RINGERS IV SOLN: INTRAVENOUS | Status: DC | PRN

## 2023-08-18 MED ORDER — PHENYLEPHRINE 80 MCG/ML (10ML) SYRINGE FOR IV PUSH (FOR BLOOD PRESSURE SUPPORT)
PREFILLED_SYRINGE | INTRAVENOUS | Status: DC | PRN
  Administered 2023-08-18 (×5): 80 ug via INTRAVENOUS

## 2023-08-18 MED ORDER — EPHEDRINE SULFATE-NACL 50-0.9 MG/10ML-% IV SOSY
PREFILLED_SYRINGE | INTRAVENOUS | Status: DC | PRN
  Administered 2023-08-18: 10 mg via INTRAVENOUS

## 2023-08-18 MED ORDER — PROPOFOL 500 MG/50ML IV EMUL
INTRAVENOUS | Status: DC | PRN
  Administered 2023-08-18: 150 ug/kg/min via INTRAVENOUS

## 2023-08-18 MED ORDER — LIDOCAINE HCL (CARDIAC) PF 100 MG/5ML IV SOSY
PREFILLED_SYRINGE | INTRAVENOUS | Status: DC | PRN
  Administered 2023-08-18: 80 mg via INTRATRACHEAL

## 2023-08-18 MED ORDER — PROPOFOL 10 MG/ML IV BOLUS
INTRAVENOUS | Status: DC | PRN
  Administered 2023-08-18 (×2): 50 mg via INTRAVENOUS

## 2023-08-18 NOTE — Progress Notes (Signed)
 Dr. Johnnette Litter reviewed EKG and will discuss with cardiologist before discharging.

## 2023-08-18 NOTE — Op Note (Signed)
 [facility] Patient Name: Raymond Malone Procedure Date: 08/18/2023 9:58 AM MRN: [id number] Date of Birth: 1975-07-08 Attending MD: Katrinka Blazing , , [id number] CSN: [id number] Age: 48 Admit Type: Outpatient Procedure:                Colonoscopy Indications:              High risk colon cancer surveillance: Personal                            history of colon cancer Providers:                Katrinka Blazing, Sheran Fava, [place] Wilson,                            Tonya Wilson Referring MD:              Medicines:                Monitored Anesthesia Care Complications:            No immediate complications. Estimated Blood Loss:     Estimated blood loss: none. Procedure:                Pre-Anesthesia Assessment:                           - Prior to the procedure, a History and Physical                            was performed, and patient medications, allergies                            and sensitivities were reviewed. The patient's                            tolerance of previous anesthesia was reviewed.                           - The risks and benefits of the procedure and the                            sedation options and risks were discussed with the                            patient. All questions were answered and informed                            consent was obtained.                           - ASA Grade Assessment: II - A patient with mild                            systemic disease.                           After obtaining informed consent, the colonoscope  was passed under direct vision. Throughout the                            procedure, the patient's blood pressure, pulse, and                            oxygen saturations were monitored continuously. The                            [id number] ([id number]) was introduced through the                            anus and advanced to the the cecum, identified by                             appendiceal orifice and ileocecal valve. The                            colonoscopy was performed without difficulty. The                            patient tolerated the procedure well. The quality                            of the bowel preparation was good. Scope In: 10:17:57 AM Scope Out: 10:45:41 AM Scope Withdrawal Time: 0 hours 21 minutes 45 seconds  Total Procedure Duration: 0 hours 27 minutes 44 seconds  Findings:      The perianal and digital rectal examinations were normal.      A 5 mm polyp was found in the distal ascending colon. The polyp was       sessile. The polyp was removed with a cold snare. Resection and       retrieval were complete.      A 10 mm scar was found in the mid rectum. The scar tissue was healthy in       appearance. This was the previous area of cancer location. Imaging was       performed using white light and narrow band imaging to visualize the       mucosa. No abnormal tissue was visualized. Biopsies were taken with a       cold forceps for histology.      A tattoo was seen in the rectum. The tattoo site appeared normal.      Non-bleeding internal hemorrhoids were found during retroflexion. The       hemorrhoids were small. Impression:               - One 5 mm polyp in the distal ascending colon,                            removed with a cold snare. Resected and retrieved.                           - Scar in the mid rectum. Biopsied.                           -  A tattoo was seen in the rectum. The tattoo site                            appeared normal.                           - Non-bleeding internal hemorrhoids. Moderate Sedation:      Per Anesthesia Care Recommendation:           - Discharge patient to home (ambulatory).                           - Resume previous diet.                           - Await pathology results.                           - Repeat colonoscopy in 3 years for surveillance.                           - Repeat flexible  sigmoidoscopy in 6 months for                            surveillance. Procedure Code(s):        --- Professional ---                           [phone], Colonoscopy, flexible; with removal of                            tumor(s), polyp(s), or other lesion(s) by snare                            technique                           45380, 59, Colonoscopy, flexible; with biopsy,                            single or multiple Diagnosis Code(s):        --- Professional ---                           W09.811, Personal history of other malignant                            neoplasm of large intestine                           D12.2, Benign neoplasm of ascending colon                           K62.89, Other specified diseases of anus and rectum                           K64.8, Other hemorrhoids CPT copyright 2022 American Medical Association. All rights reserved.  The codes documented in this report are preliminary and upon coder review may  be revised to meet current compliance requirements. Katrinka Blazing, MD Katrinka Blazing,  08/18/2023 10:56:51 AM This report has been signed electronically. Number of Addenda: 0

## 2023-08-18 NOTE — Progress Notes (Signed)
 ECG post op showing irregular rhythm, sinus brady with QRS dropping frequently.  Dr. Johnnette Litter informed and EKG obtained.

## 2023-08-18 NOTE — Interval H&P Note (Signed)
 History and Physical Interval Note:  08/18/2023 9:37 AM  Raymond Malone  has presented today for surgery, with the diagnosis of SURVEILLANCE RECTAL CANCER.  The various methods of treatment have been discussed with the patient and family. After consideration of risks, benefits and other options for treatment, the patient has consented to  Procedure(s) with comments: COLONOSCOPY (N/A) - 10:45AM;ASA 1 as a surgical intervention.  The patient's history has been reviewed, patient examined, no change in status, stable for surgery.  I have reviewed the patient's chart and labs.  Questions were answered to the patient's satisfaction.     Katrinka Blazing [place]

## 2023-08-18 NOTE — Transfer of Care (Signed)
 Immediate Anesthesia Transfer of Care Note  Patient: Raymond Malone  Procedure(s) Performed: COLONOSCOPY POLYPECTOMY, INTESTINE  Patient Location: PACU and Endoscopy Unit  Anesthesia Type:MAC  Level of Consciousness: awake and alert   Airway & Oxygen Therapy: Patient Spontanous Breathing and Patient connected to nasal cannula oxygen  Post-op Assessment: Report given to RN and Post -op Vital signs reviewed and stable  Post vital signs: Reviewed and stable  Last Vitals:  Vitals Value Taken Time  BP 105/69 08/18/23 1052  Temp 36.4 C 08/18/23 1052  Pulse 64 08/18/23 1052  Resp 20 08/18/23 1052  SpO2 96 % 08/18/23 1052    Last Pain:  Vitals:   08/18/23 1052  TempSrc: Oral  PainSc: 0-No pain      Patients Stated Pain Goal: 4 (08/18/23 0929)  Complications: No notable events documented.

## 2023-08-18 NOTE — Progress Notes (Signed)
 Asked Dr. Diona Browner from Cardiology to review EKG.  He notes "second-degree type I Wenckebach block" and recommends discharge unless the patient is symptomatic, which he is not.  Will follow-up with Dr. Lubertha Basque office.

## 2023-08-18 NOTE — Anesthesia Preprocedure Evaluation (Signed)
 Anesthesia Evaluation  Patient identified by MRN, date of birth, ID band Patient awake    Reviewed: Allergy & Precautions, H&P , NPO status , Patient's Chart, lab work & pertinent test results, reviewed documented beta blocker date and time   Airway Mallampati: II  TM Distance: >3 FB Neck ROM: full    Dental no notable dental hx.    Pulmonary neg pulmonary ROS, former smoker   Pulmonary exam normal breath sounds clear to auscultation       Cardiovascular Exercise Tolerance: Good hypertension,  Rhythm:regular Rate:Normal     Neuro/Psych  PSYCHIATRIC DISORDERS      negative neurological ROS     GI/Hepatic Neg liver ROS,GERD  ,,  Endo/Other  diabetes    Renal/GU negative Renal ROS  negative genitourinary   Musculoskeletal   Abdominal   Peds  Hematology negative hematology ROS (+)   Anesthesia Other Findings   Reproductive/Obstetrics negative OB ROS                             Anesthesia Physical Anesthesia Plan  ASA: 2  Anesthesia Plan: General   Post-op Pain Management:    Induction:   PONV Risk Score and Plan: Propofol infusion  Airway Management Planned:   Additional Equipment:   Intra-op Plan:   Post-operative Plan:   Informed Consent: I have reviewed the patients History and Physical, chart, labs and discussed the procedure including the risks, benefits and alternatives for the proposed anesthesia with the patient or authorized representative who has indicated his/her understanding and acceptance.     Dental Advisory Given  Plan Discussed with: CRNA  Anesthesia Plan Comments:        Anesthesia Quick Evaluation

## 2023-08-18 NOTE — Discharge Instructions (Signed)
 You are being discharged to home.  Resume your previous diet.  We are waiting for your pathology results.  Your physician has recommended a repeat colonoscopy in three years for surveillance.  Repeat flexible sigmoidoscopy in 6 months for surveillance.

## 2023-08-19 ENCOUNTER — Encounter (HOSPITAL_COMMUNITY): Payer: Self-pay | Admitting: Gastroenterology

## 2023-08-19 LAB — SURGICAL PATHOLOGY

## 2023-08-19 NOTE — Anesthesia Postprocedure Evaluation (Signed)
 Anesthesia Post Note  Patient: Raymond Malone  Procedure(s) Performed: COLONOSCOPY POLYPECTOMY, INTESTINE  Patient location during evaluation: Phase II Anesthesia Type: General Level of consciousness: awake Pain management: pain level controlled Vital Signs Assessment: post-procedure vital signs reviewed and stable Respiratory status: spontaneous breathing and respiratory function stable Cardiovascular status: blood pressure returned to baseline and stable Postop Assessment: no headache and no apparent nausea or vomiting Anesthetic complications: no Comments: Late entry   No notable events documented.   Last Vitals:  Vitals:   08/18/23 0929 08/18/23 1052  BP: 115/73 105/69  Pulse: 78 64  Resp: 19 20  Temp: 36.8 C (!) 36.4 C  SpO2: 98% 96%    Last Pain:  Vitals:   08/18/23 1052  TempSrc: Oral  PainSc: 0-No pain                 Windell Norfolk

## 2023-08-20 ENCOUNTER — Encounter (INDEPENDENT_AMBULATORY_CARE_PROVIDER_SITE_OTHER): Payer: Self-pay | Admitting: *Deleted

## 2023-08-23 ENCOUNTER — Telehealth: Payer: Medicaid Other | Admitting: Family Medicine

## 2023-08-29 ENCOUNTER — Other Ambulatory Visit: Payer: Self-pay | Admitting: Family Medicine

## 2023-08-29 DIAGNOSIS — K219 Gastro-esophageal reflux disease without esophagitis: Secondary | ICD-10-CM

## 2023-08-30 ENCOUNTER — Inpatient Hospital Stay: Payer: 59 | Attending: Hematology

## 2023-08-30 ENCOUNTER — Ambulatory Visit (HOSPITAL_COMMUNITY)
Admission: RE | Admit: 2023-08-30 | Discharge: 2023-08-30 | Disposition: A | Discharge status: Home / Self Care | Payer: 59 | Source: Ambulatory Visit | Attending: Hematology | Admitting: Hematology

## 2023-08-30 ENCOUNTER — Ambulatory Visit (HOSPITAL_COMMUNITY): Admission: RE | Admit: 2023-08-30 | Payer: 59 | Source: Ambulatory Visit | Admitting: Gastroenterology

## 2023-08-30 DIAGNOSIS — G629 Polyneuropathy, unspecified: Secondary | ICD-10-CM | POA: Diagnosis not present

## 2023-08-30 DIAGNOSIS — Z9221 Personal history of antineoplastic chemotherapy: Secondary | ICD-10-CM | POA: Diagnosis not present

## 2023-08-30 DIAGNOSIS — Z87891 Personal history of nicotine dependence: Secondary | ICD-10-CM | POA: Insufficient documentation

## 2023-08-30 DIAGNOSIS — C20 Malignant neoplasm of rectum: Secondary | ICD-10-CM

## 2023-08-30 DIAGNOSIS — K5900 Constipation, unspecified: Secondary | ICD-10-CM | POA: Diagnosis not present

## 2023-08-30 DIAGNOSIS — R911 Solitary pulmonary nodule: Secondary | ICD-10-CM | POA: Insufficient documentation

## 2023-08-30 DIAGNOSIS — Z79899 Other long term (current) drug therapy: Secondary | ICD-10-CM | POA: Insufficient documentation

## 2023-08-30 DIAGNOSIS — Z08 Encounter for follow-up examination after completed treatment for malignant neoplasm: Secondary | ICD-10-CM | POA: Diagnosis not present

## 2023-08-30 LAB — COMPREHENSIVE METABOLIC PANEL WITH GFR
ALT: 29 U/L (ref 0–44)
AST: 22 U/L (ref 15–41)
Albumin: 4.1 g/dL (ref 3.5–5.0)
Alkaline Phosphatase: 65 U/L (ref 38–126)
Anion gap: 10 (ref 5–15)
BUN: 12 mg/dL (ref 6–20)
CO2: 24 mmol/L (ref 22–32)
Calcium: 9.3 mg/dL (ref 8.9–10.3)
Chloride: 99 mmol/L (ref 98–111)
Creatinine, Ser: 0.87 mg/dL (ref 0.61–1.24)
GFR, Estimated: >60 mL/min (ref >60)
Glucose, Bld: 132 mg/dL — ABNORMAL HIGH (ref 70–99)
Potassium: 3.5 mmol/L (ref 3.5–5.1)
Sodium: 133 mmol/L — ABNORMAL LOW (ref 135–145)
Total Bilirubin: 0.7 mg/dL (ref 0.0–1.2)
Total Protein: 7.1 g/dL (ref 6.5–8.1)

## 2023-08-30 LAB — CBC WITH DIFFERENTIAL/PLATELET
Abs Immature Granulocytes: 0.01 10*3/uL (ref 0.00–0.07)
Basophils Absolute: 0.1 10*3/uL (ref 0.0–0.1)
Basophils Relative: 1 %
Eosinophils Absolute: 0.4 10*3/uL (ref 0.0–0.5)
Eosinophils Relative: 6 %
HCT: 41.4 % (ref 39.0–52.0)
Hemoglobin: 14.3 g/dL (ref 13.0–17.0)
Immature Granulocytes: 0 %
Lymphocytes Relative: 14 %
Lymphs Abs: 0.8 10*3/uL (ref 0.7–4.0)
MCH: 29.7 pg (ref 26.0–34.0)
MCHC: 34.5 g/dL (ref 30.0–36.0)
MCV: 85.9 fL (ref 80.0–100.0)
Monocytes Absolute: 0.9 10*3/uL (ref 0.1–1.0)
Monocytes Relative: 16 %
Neutro Abs: 3.5 10*3/uL (ref 1.7–7.7)
Neutrophils Relative %: 63 %
Platelets: 295 10*3/uL (ref 150–400)
RBC: 4.82 MIL/uL (ref 4.22–5.81)
RDW: 12.1 % (ref 11.5–15.5)
WBC: 5.6 10*3/uL (ref 4.0–10.5)
nRBC: 0 % (ref 0.0–0.2)

## 2023-08-30 MED ORDER — IOHEXOL 300 MG/ML  SOLN
100.0000 mL | Freq: Once | INTRAMUSCULAR | Status: AC | PRN
  Administered 2023-08-30: 100 mL via INTRAVENOUS

## 2023-08-31 LAB — CEA: CEA: 3 ng/mL (ref 0.0–4.7)

## 2023-09-02 ENCOUNTER — Ambulatory Visit (HOSPITAL_COMMUNITY)
Admission: RE | Admit: 2023-09-02 | Discharge: 2023-09-02 | Disposition: A | Discharge status: Home / Self Care | Source: Ambulatory Visit | Attending: Hematology | Admitting: Hematology

## 2023-09-02 DIAGNOSIS — C20 Malignant neoplasm of rectum: Secondary | ICD-10-CM | POA: Insufficient documentation

## 2023-09-06 ENCOUNTER — Inpatient Hospital Stay: Payer: 59 | Admitting: Hematology

## 2023-09-06 ENCOUNTER — Ambulatory Visit: Payer: Self-pay | Admitting: Family Medicine

## 2023-09-08 ENCOUNTER — Telehealth (INDEPENDENT_AMBULATORY_CARE_PROVIDER_SITE_OTHER): Payer: Self-pay | Admitting: Gastroenterology

## 2023-09-08 ENCOUNTER — Inpatient Hospital Stay (HOSPITAL_BASED_OUTPATIENT_CLINIC_OR_DEPARTMENT_OTHER): Admitting: Hematology

## 2023-09-08 ENCOUNTER — Encounter: Payer: Self-pay | Admitting: Hematology

## 2023-09-08 VITALS — BP 116/72 | HR 87 | Temp 98.2°F | Resp 16 | Wt 222.7 lb

## 2023-09-08 DIAGNOSIS — C20 Malignant neoplasm of rectum: Secondary | ICD-10-CM

## 2023-09-08 MED ORDER — GABAPENTIN 300 MG PO CAPS: 600.0000 mg | ORAL_CAPSULE | Freq: Three times a day (TID) | ORAL | 3 refills | Status: DC

## 2023-09-08 NOTE — Patient Instructions (Addendum)
 [facility] at [facility] Discharge Instructions   You were seen and examined today by Dr. Cheree Cords.  He reviewed the results of your lab work which are normal/stable.   He reviewed the results of your scan which did not show any evidence of cancer.   We will refer you back to Dr. Sammi Crick for a sigmoidoscopy.   We will see you back in 6 months. We will repeat scans and lab work prior to this visit.    Return as scheduled.    Thank you for choosing [facility] at [facility] to provide your oncology and hematology care.  To afford each patient quality time with our provider, please arrive at least 15 minutes before your scheduled appointment time.   If you have a lab appointment with the Cancer Center please come in thru the Main Entrance and check in at the main information desk.  You need to re-schedule your appointment should you arrive 10 or more minutes late.  We strive to give you quality time with our providers, and arriving late affects you and other patients whose appointments are after yours.  Also, if you no show three or more times for appointments you may be dismissed from the clinic at the providers discretion.     Again, thank you for choosing [facility].  Our hope is that these requests will decrease the amount of time that you wait before being seen by our physicians.       _____________________________________________________________  Should you have questions after your visit to [facility], please contact our office at [phone] and follow the prompts.  Our office hours are 8:00 a.m. and 4:30 p.m. Monday - Friday.  Please note that voicemails left after 4:00 p.m. may not be returned until the following business day.  We are closed weekends and major holidays.  You do have access to a nurse 24-7, just call the main number to the clinic [phone] and do not press any options, hold on the line  and a nurse will answer the phone.    For prescription refill requests, have your pharmacy contact our office and allow 72 hours.    Due to Covid, you will need to wear a mask upon entering the hospital. If you do not have a mask, a mask will be given to you at the Main Entrance upon arrival. For doctor visits, patients may have 1 support person age 42 or older with them. For treatment visits, patients can not have anyone with them due to social distancing guidelines and our immunocompromised population.

## 2023-09-08 NOTE — Telephone Encounter (Signed)
 Discussed case with Dr. Katragadda.  Per protocol, the patient will need repeat flexible sigmoidoscopy every 4 months for the first 2 years starting on July 2024.  He will be due for a repeat flexible sigmoidoscopy in September 2024.  After reaching the 2-year mark, he will need repeat flexible sigmoidoscopy every 6 months until July 2029.  Hi Ann, can you please update his recall flexible sigmoidoscopy for September 2025? Dx: rectal cancer  Thanks,   Samantha Cress, MD Gastroenterology and Hepatology [facility]

## 2023-09-08 NOTE — Progress Notes (Signed)
 [facility] [address], [place] [address]    Clinic Day:  09/08/23   Referring physician: Zarwolo, Gloria, FNP  Patient Care Team: Zarwolo, Gloria, FNP as PCP - General (Family Medicine) Gerard Knight, MD as PCP - Cardiology (Cardiology)   ASSESSMENT & PLAN:   Assessment: 1.  Stage II (T3N0) rectal adenocarcinoma: - Colonoscopy (01/06/2022): Ulcerated nonobstructing medium-sized mass found at 8 cm proximal to the anus extending to 11 cm.  Mass was not circumferential. - Pathology: Adenocarcinoma arising in adenoma with high-grade dysplasia.  Tubular adenomas in the ascending colon and hyperplastic polyp in the descending colon. - CT CAP (01/08/2022): Mildly enlarged left external iliac lymph node measures 17 mm in short axis.  No evidence of metastatic disease in the chest or abdomen.  Subcutaneous stranding with skin thickening along the left gluteal crease and a 2.2 cm fluid collection. - MRI pelvis (01/10/2022): T3N0.  Distance from tumor to the internal anal sphincter is 7 cm. - CEA (01/06/2022): 12.0 - His case was discussed at tumor board.  TNT was recommended as tumor was relatively low-lying. - Neoadjuvant FOLFOX from 02/16/2022 through 06/01/2022 - MRI pelvis (07/01/2022): At least more than 20% decrease in tumor volume. - Sigmoidoscopy with biopsy (07/16/2022): Negative for dysplasia or carcinoma. - He met with Dr. Camilo Cella: Who recommended proceeding with chemoradiation, with the possibility of skipping surgery altogether. - Xeloda  and XRT started on 09/29/2022 completed on 11/16/2022   2.  Social/family history: - He lives at home with his fiance and children.  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  Quit smoking cigarettes 5 to 6 months ago.  Smoked 1 pack/day for 28 years. - No family history of malignancies.    Plan: 1.  Stage II (T3N0) rectal adenocarcinoma: - He had colonoscopy by Dr. Sammi Crick on 08/18/2023. - I reviewed pathology report  which showed tubular adenoma in the ascending colon.  Rectal scar biopsy was benign. - We reviewed labs from 08/30/2023: Normal LFTs and CBC.  CEA is normal at 3.0. - CT chest showed stable 5 mm right lower lobe lung nodule.  CT abdomen was negative for metastatic disease. - MRI pelvis on 09/02/2023: Stable thickening of the mid rectum with no residual tumor. - I have recommended continued close surveillance.  He will need sigmoidoscopy every 4 months for the first 2 years followed by every 6 months for total of 5 years.  We will also schedule for CT of the chest and abdomen and MRI of the pelvis along with LFTs and CEA level in 6 months.   2.  Peripheral neuropathy: - He is taking gabapentin  300 mg 2-3 times daily.  He still has some pins and needle sensation in the fingertips, toes and bottom of the feet.  He does not report drowsiness. - Will increase gabapentin  to 600 mg 2-3 times daily.   3.  Constipation: - He has tried taking fiber supplements, stool softeners and MiraLAX  which did not help.  Ex-Lax helps.  Continue Lasix every day.    No orders of the defined types were placed in this encounter.     Nadeen Augusta Teague,acting as a Neurosurgeon for Paulett Boros, MD.,have documented all relevant documentation on the behalf of Paulett Boros, MD,as directed by  Paulett Boros, MD while in the presence of Paulett Boros, MD.  I, Paulett Boros MD, have reviewed the above documentation for accuracy and completeness, and I agree with the above.  Paulett Boros, MD   4/30/202510:43 AM  CHIEF COMPLAINT:   Diagnosis: T3N0 high rectal cancer    Cancer Staging  Rectal cancer [facility]) Staging form: Colon and Rectum, AJCC 8th Edition - Clinical stage from 02/02/2022: Stage IIA (cT3, cN0, cM0) - Signed by Paulett Boros, MD on 02/02/2022    Prior Therapy: Neoadjuvant FOLFOX   Current Therapy:  Capecitabine  and XRT    HISTORY OF PRESENT ILLNESS:    Oncology History  Rectal cancer (HCC)  01/07/2022 Initial Diagnosis   Rectal adenocarcinoma (HCC)   02/02/2022 Cancer Staging   Staging form: Colon and Rectum, AJCC 8th Edition - Clinical stage from 02/02/2022: Stage IIA (cT3, cN0, cM0) - Signed by Paulett Boros, MD on 02/02/2022 Histopathologic type: Adenocarcinoma, NOS Total positive nodes: 0   02/16/2022 -  Chemotherapy   Patient is on Treatment Plan : COLORECTAL FOLFOX q14d x 4 months        INTERVAL HISTORY:   Raymond Malone is a 48 y.o. male presenting to clinic today for follow up of T3N0 high rectal cancer. He was last seen by me on 05/06/23.   Since his last visit, he underwent MRI pelvis rectal staging on 09/02/23 that found: Stable slight thickening of the mid rectum anterior left. No restricted diffusion. No residual tumor identified at this time. No developing nodal enlargement in the visualized pelvis.   Raymond Malone had a colonoscopy on 08/18/23 with Dr. Sammi Crick. A polypectomy found in the ascending colon was shown to be tubular adenoma, negative for high-grade dysplasia or malignancy on pathology report. Rectal scar biopsy was found to be benign colorectal mucosa with mild hyperplastic change.   Today, he states that he is doing well overall. His appetite level is at 100%. His energy level is at 40%.  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) 10/06/2016   Hypertension    MRSA (methicillin resistant Staphylococcus aureus)    Skin abscess    Type 2 diabetes mellitus (HCC)     Surgical History: Past Surgical History:  Procedure Laterality Date   ANKLE SURGERY Left    BIOPSY  01/06/2022   Procedure: BIOPSY;   Surgeon: Urban Garden, MD;  [facility];  Service: Gastroenterology;;   BIOPSY  07/16/2022   Procedure: BIOPSY;  Surgeon: Urban Garden, MD;  [facility];  Service: Gastroenterology;;   BIOPSY  01/07/2023   Procedure: BIOPSY;  Surgeon: Urban Garden, MD;  [facility];  Service: Gastroenterology;;   COLONOSCOPY N/A 08/18/2023   Procedure: COLONOSCOPY;  Surgeon: Urban Garden, MD;  [facility];  Service: Gastroenterology;  Laterality: N/A;  10:45AM;ASA 1   COLONOSCOPY WITH PROPOFOL  N/A 01/06/2022   Procedure: COLONOSCOPY WITH PROPOFOL ;  Surgeon: Urban Garden, MD;  [facility];  Service: Gastroenterology;  Laterality: N/A;  730   FLEXIBLE SIGMOIDOSCOPY N/A 07/16/2022   Procedure: FLEXIBLE SIGMOIDOSCOPY;  Surgeon: Urban Garden, MD;  [facility];  Service: Gastroenterology;  Laterality: N/A;  215pm, asa 1-2   FLEXIBLE SIGMOIDOSCOPY N/A 01/07/2023   Procedure: FLEXIBLE SIGMOIDOSCOPY;  Surgeon: Urban Garden, MD;  [facility];  Service: Gastroenterology;  Laterality: N/A;  10:15AM;ASA  1   HAND SURGERY     boxer's fracture L hand   INCISION AND DRAINAGE PERIRECTAL ABSCESS     IR IMAGING GUIDED PORT INSERTION  02/10/2022   MYRINGOTOMY WITH TUBE PLACEMENT Bilateral 07/22/2020   Procedure: BILATERAL MYRINGOTOMY WITH TUBE PLACEMENT;  Surgeon: Reynold Caves, MD;  Location: [facility];  Service: ENT;  Laterality: Bilateral;   POLYPECTOMY  01/06/2022   Procedure: POLYPECTOMY INTESTINAL;  Surgeon: Urban Garden, MD;  [facility];  Service: Gastroenterology;;   POLYPECTOMY  08/18/2023   Procedure: POLYPECTOMY, INTESTINE;  Surgeon: Urban Garden, MD;  [facility];  Service: Gastroenterology;;   SVT ABLATION N/A 04/16/2021   Procedure: SVT ABLATION;  Surgeon: Tammie Fall, MD;  Location:  [facility];  Service: Cardiovascular;  Laterality: N/A;    Social History: Social History   Socioeconomic History   Marital status: Single    Spouse name: Shameka   Number of children: 7   Years of education: 14   Highest education level: Not on file  Occupational History   Occupation: factory work, sorting  Tobacco Use   Smoking status: Former    Current packs/day: 1.00    Average packs/day: 1 pack/day for 29.3 years (29.3 ttl pk-yrs)    Types: Cigarettes    Start date: 05/11/1994   Smokeless tobacco: Never  Vaping Use   Vaping status: Every Day   Substances: Nicotine , Flavoring  Substance and Sexual Activity   Alcohol use: Not Currently   Drug use: Not Currently    Types: Marijuana   Sexual activity: Yes    Birth control/protection: Condom  Other Topics Concern   Not on file  Social History Narrative   Lives with fiancee, mother, and 7 children, 3 are biological his, 2 are hers and 1 nephew   No pets in the home      Enjoys: spending time with the kids      Diet: Eats all food groups, does not have a true focus for diabetes   Caffeine: Drinks about 1 or 2 sodas a week overall has reduced his caffeine intake sometimes some tea   Water : Reports taking 6-8 times a day for more      Wears a seatbelt, does not use his phone or driving   Smoke detectors at home   Does not have fire extinguisher at this time   No weapons in the home   Social Drivers of Health   Financial Resource Strain: Low Risk  (02/09/2020)   Overall Financial Resource Strain (CARDIA)    Difficulty of Paying Living Expenses: Not hard at all  Food Insecurity: No Food Insecurity (02/09/2020)   Hunger Vital Sign    Worried About Running Out of Food in the Last Year: Never true    Ran Out of Food in the Last Year: Never true  Transportation Needs: No Transportation Needs (02/09/2020)   PRAPARE - Administrator, Civil Service (Medical): No    Lack of Transportation (Non-Medical): No   Physical Activity: Inactive (02/09/2020)   Exercise Vital Sign    Days of Exercise per Week: 0 days    Minutes of Exercise per Session: 0 min  Stress: Stress Concern Present (02/09/2020)   [employer] of Occupational Health - Occupational Stress Questionnaire    Feeling of Stress : To some extent  Social Connections: Moderately Isolated (02/09/2020)   Social Connection and Isolation Panel [NHANES]    Frequency of Communication  with Friends and Family: More than three times a week    Frequency of Social Gatherings with Friends and Family: More than three times a week    Attends Religious Services: Never    Database administrator or Organizations: No    Attends Banker Meetings: Never    Marital Status: Living with partner  Intimate Partner Violence: Not At Risk (02/09/2020)   Humiliation, Afraid, Rape, and Kick questionnaire    Fear of Current or Ex-Partner: No    Emotionally Abused: No    Physically Abused: No    Sexually Abused: No    Family History: Family History  Problem Relation Age of Onset   Hypertension Mother     Current Medications:  Current Outpatient Medications:    Accu-Chek Softclix Lancets lancets, 1 each by Other route 2 (two) times daily. Use as instructed, Disp: 100 each, Rfl: 2   alfuzosin  (UROXATRAL ) 10 MG 24 hr tablet, Take 1 tablet (10 mg total) by mouth at bedtime., Disp: 30 tablet, Rfl: 11   blood glucose meter kit and supplies, Dispense based on patient and insurance preference. Use up to four times daily as directed. (FOR ICD-10 E10.9, E11.9)., Disp: 1 each, Rfl: 0   Blood Glucose Monitoring Suppl (ACCU-CHEK GUIDE ME) w/Device KIT, Use to test BG bid. E11.65, Disp: 1 kit, Rfl: 0   dapagliflozin  propanediol (FARXIGA ) 10 MG TABS tablet, Take 1 tablet (10 mg total) by mouth daily., Disp: 90 tablet, Rfl: 0   docusate sodium  (COLACE) 100 MG capsule, Take 1 capsule (100 mg total) by mouth 2 (two) times daily., Disp: 60 capsule, Rfl: 0    gabapentin  (NEURONTIN ) 300 MG capsule, Take 1 capsule (300 mg total) by mouth 3 (three) times daily., Disp: 90 capsule, Rfl: 3   glucose blood (ACCU-CHEK GUIDE) test strip, USE 1 STRIP TO TEST GLUCOSE TWICE DAILY E11.65, Disp: 100 each, Rfl: 1   hydrocortisone -pramoxine (PROCTOFOAM-HC) rectal foam, Place 1 applicator rectally 2 (two) times daily., Disp: 10 g, Rfl: 3   lactulose  (CHRONULAC ) 10 GM/15ML solution, Take 15 mLs (10 g total) by mouth at bedtime. If Colace is not working or 3 days w/o BM Take 15mL at bedtime every night to assist with regular bowel movements. Titrate down if having multiple bowel movements. If a bowel movement has not occurred in 3-4 days or longer take 15mL every 3 hours until a bowel movement has occurred., Disp: 236 mL, Rfl: 1   lisinopril -hydrochlorothiazide  (ZESTORETIC ) 20-25 MG tablet, Take 1 tablet by mouth daily., Disp: 90 tablet, Rfl: 1   metFORMIN  (GLUCOPHAGE ) 500 MG tablet, Take 1 tablet (500 mg total) by mouth 2 (two) times daily with a meal., Disp: 180 tablet, Rfl: 0   pantoprazole  (PROTONIX ) 40 MG tablet, Take 1 tablet by mouth once daily, Disp: 30 tablet, Rfl: 0   pyridOXINE (VITAMIN B6) 50 MG tablet, Take 1 tablet (50 mg total) by mouth daily., Disp: 30 tablet, Rfl: 1   senna (SENOKOT) 8.6 MG TABS tablet, Take 1 tablet (8.6 mg total) by mouth daily., Disp: 120 tablet, Rfl: 0   sildenafil  (VIAGRA ) 100 MG tablet, Take 0.5 tablets (50 mg total) by mouth daily as needed for erectile dysfunction. Erectile dysfunction: recommended to take 1 hour prior to sexual activity; do not take >1 dose per day, Disp: 20 tablet, Rfl: 1   simvastatin  (ZOCOR ) 40 MG tablet, Take 1 tablet (40 mg total) by mouth every evening., Disp: 90 tablet, Rfl: 0   tadalafil  (CIALIS ) 20  MG tablet, Take 1 tablet (20 mg total) by mouth as needed., Disp: 30 tablet, Rfl: 5   Testosterone  1.62 % GEL, Place 2 Pump onto the skin daily., Disp: 75 g, Rfl: 3   Vitamin D , Ergocalciferol , (DRISDOL ) 1.25 MG  (50000 UNIT) CAPS capsule, Take 1 capsule (50,000 Units total) by mouth every 7 (seven) days., Disp: 20 capsule, Rfl: 1   Allergies: No Known Allergies  REVIEW OF SYSTEMS:   Review of Systems  Constitutional:  Negative for chills, fatigue and fever.  HENT:   Negative for lump/mass, mouth sores, nosebleeds, sore throat and trouble swallowing.   Eyes:  Negative for eye problems.  Respiratory:  Negative for cough and shortness of breath.   Cardiovascular:  Positive for palpitations. Negative for chest pain and leg swelling.  Gastrointestinal:  Positive for constipation. Negative for abdominal pain, diarrhea, nausea and vomiting.  Genitourinary:  Negative for bladder incontinence, difficulty urinating, dysuria, frequency, hematuria and nocturia.   Musculoskeletal:  Negative for arthralgias, back pain, flank pain, myalgias and neck pain.  Skin:  Negative for itching and rash.  Neurological:  Positive for dizziness and numbness. Negative for headaches.  Hematological:  Does not bruise/bleed easily.  Psychiatric/Behavioral:  Negative for depression, sleep disturbance and suicidal ideas. The patient is not nervous/anxious.   All other systems reviewed and are negative.    VITALS:   Blood pressure 116/72, pulse 87, temperature 98.2 F (36.8 C), temperature source Oral, resp. rate 16, weight 222 lb 10.6 oz (101 kg), SpO2 98%.  Wt Readings from Last 3 Encounters:  09/08/23 222 lb 10.6 oz (101 kg)  08/18/23 216 lb (98 kg)  07/26/23 222 lb (100.7 kg)    Body mass index is 31.06 kg/m.  Performance status (ECOG): 0 - Asymptomatic  PHYSICAL EXAM:   Physical Exam Vitals and nursing note reviewed. Exam conducted with a chaperone present.  Constitutional:      Appearance: Normal appearance.  Cardiovascular:     Rate and Rhythm: Normal rate and regular rhythm.     Pulses: Normal pulses.     Heart sounds: Normal heart sounds.  Pulmonary:     Effort: Pulmonary effort is normal.     Breath  sounds: Normal breath sounds.  Abdominal:     Palpations: Abdomen is soft. There is no hepatomegaly, splenomegaly or mass.     Tenderness: There is no abdominal tenderness.  Musculoskeletal:     Right lower leg: No edema.     Left lower leg: No edema.  Lymphadenopathy:     Cervical: No cervical adenopathy.     Right cervical: No superficial, deep or posterior cervical adenopathy.    Left cervical: No superficial, deep or posterior cervical adenopathy.     Upper Body:     Right upper body: No supraclavicular or axillary adenopathy.     Left upper body: No supraclavicular or axillary adenopathy.  Neurological:     General: No focal deficit present.     Mental Status: He is alert and oriented to person, place, and time.  Psychiatric:        Mood and Affect: Mood normal.        Behavior: Behavior normal.     LABS:      Latest Ref Rng & Units 08/30/2023    3:08 PM 06/23/2023    8:46 AM 03/30/2023   11:38 AM  CBC  WBC 4.0 - 10.5 K/uL 5.6  4.2  5.7   Hemoglobin 13.0 - 17.0 g/dL 14.3  15.6  15.7   Hematocrit 39.0 - 52.0 % 41.4  46.0  45.8   Platelets 150 - 400 K/uL 295  325  321       Latest Ref Rng & Units 08/30/2023    3:08 PM 06/23/2023    8:46 AM 03/30/2023   11:38 AM  CMP  Glucose 70 - 99 mg/dL 098  119  147   BUN 6 - 20 mg/dL 12  14  11    Creatinine 0.61 - 1.24 mg/dL 8.29  5.62  1.30   Sodium 135 - 145 mmol/L 133  136  131   Potassium 3.5 - 5.1 mmol/L 3.5  5.0  4.0   Chloride 98 - 111 mmol/L 99  98  97   CO2 22 - 32 mmol/L 24  24  24    Calcium  8.9 - 10.3 mg/dL 9.3  9.9  9.1   Total Protein 6.5 - 8.1 g/dL 7.1  6.8  7.8   Total Bilirubin 0.0 - 1.2 mg/dL 0.7  0.6  0.6   Alkaline Phos 38 - 126 U/L 65  84  72   AST 15 - 41 U/L 22  18  19    ALT 0 - 44 U/L 29  26  25       Lab Results  Component Value Date   CEA1 3.0 08/30/2023   /  CEA  Date Value Ref Range Status  08/30/2023 3.0 0.0 - 4.7 ng/mL Final    Comment:    (NOTE)                             Nonsmokers           <3.9                             Smokers             <5.6 [employer] Electrochemiluminescence Immunoassay (ECLIA) Values obtained with different assay methods or kits cannot be used interchangeably.  Results cannot be interpreted as absolute evidence of the presence or absence of malignant disease. Performed At: [facility] [address] [place], [place] [id number] Pearlean Botts MD EX:[id number]    No results found for: "PSA1" No results found for: [phone]" No results found for: "CAN125"  No results found for: "TOTALPROTELP", "ALBUMINELP", "A1GS", "A2GS", "BETS", "BETA2SER", "GAMS", "MSPIKE", "SPEI" No results found for: "TIBC", "FERRITIN", "IRONPCTSAT" No results found for: "LDH"   STUDIES:   MR PELVIS WO CM RECTAL CA STAGING Result Date: 09/03/2023 CLINICAL DATA:  Monitor rectal cancer. EXAM: MRI PELVIS WITHOUT CONTRAST TECHNIQUE: Multiplanar multisequence MR imaging of the pelvis was performed. No intravenous contrast was administered. Ultrasound gel was administered per rectum to optimize tumor evaluation. COMPARISON:  MRI 04/17/2023 and older. FINDINGS: TUMOR LOCATION Tumor distance from Anal Verge/Skin surface: 11 cm Tumor distance to Internal Anal sphincter: 7 cm TUMOR DESCRIPTION Small amount of residual thickening of bowel wall is seen along the mid rectum from proximally the 12 o'clock to 3 o'clock position. Previous measurement of approximately 6 mm in thickness, today 6 mm. The component along the mucosa left lateral is decreasing slightly. Slight surface irregularity but no clear disruption of the outer wall of the rectum. T - CATEGORY Extension through Muscularis [employer]pri[employer]: No Shortest Distance of any tumor/node from Mesorectal fascia: No Extramural Vascular Invasion/Tumor Thrombus: No Invasion of Anterior Peritoneal Reflection: No Involvement of Adjacent Organs  or Pelvic Sidewall: No Levator Ani Involvement: None N - CATEGORY Mesorectal Lymph Nodes >=49mm:  No Extra-mesorectal Lymphadenopathy: No Other: No free fluid in the pelvis. Preserved seminal vesicles and prostate. Mild BPH changes. Preserved contour to the underdistended urinary bladder. No marrow or soft tissue edema. IMPRESSION: Stable slight thickening of the mid rectum anterior left. No restricted diffusion. No residual tumor identified at this time. No developing nodal enlargement in the visualized pelvis. Electronically Signed   By: Adrianna Horde M.D.   On: 09/03/2023 10:54   CT Chest W Contrast Result Date: 08/31/2023 CLINICAL DATA:  Rectal cancer staging.  * Tracking Code: BO * EXAM: CT CHEST AND ABDOMEN WITH CONTRAST TECHNIQUE: Multidetector CT imaging of the chest and abdomen was performed following the standard protocol during bolus administration of intravenous contrast. RADIATION DOSE REDUCTION: This exam was performed according to the departmental dose-optimization program which includes automated exposure control, adjustment of the mA and/or kV according to patient size and/or use of iterative reconstruction technique. CONTRAST:  OMNIPAQUE  IOHEXOL  300 MG/ML  SOLN COMPARISON:  Abdomen pelvis CT 12/18/2022 and older. PET-CT scan 01/29/2022. Chest CT abdomen pelvis CT 01/08/2022. FINDINGS: CT CHEST FINDINGS Cardiovascular: Thoracic aorta is normal course and caliber. There is a bovine arch identified, normal variant. Heart is nonenlarged. No significant pericardial effusion. Right IJ chest port in place. Tip of the catheter extends to the SVC right atrial junction. Mediastinum/Nodes: Preserved thyroid  gland. Patulous esophagus. No specific abnormal lymph node enlargement identified in the axillary regions, hilum or mediastinum. Lungs/Pleura: No consolidation, pneumothorax or effusion. Breathing motion. There is a small juxtapleural nodule right lower lobe laterally measuring 4 mm on series 3, image 92. This was present on the prior but appears smaller. This could relate to slice  misregistration. No new dominant lung nodule. Musculoskeletal: Curvature of the spine with some mild degenerative changes. CT ABDOMEN FINDINGS Hepatobiliary: No focal liver abnormality is seen. No gallstones, gallbladder wall thickening, or biliary dilatation. Patent portal vein. Pancreas: Unremarkable. No pancreatic ductal dilatation or surrounding inflammatory changes. Spleen: Normal in size without focal abnormality. Adrenals/Urinary Tract: Adrenal glands are unremarkable. Kidneys are normal, without renal calculi, focal lesion, or hydronephrosis. Stomach/Bowel: Stomach is within normal limits. Appendix appears normal. No evidence of bowel wall thickening, distention, or inflammatory changes. Oral contrast was administered. Vascular/Lymphatic: Aortic atherosclerosis. No enlarged abdominal or pelvic lymph nodes. Other: No free air or free fluid. Musculoskeletal: Curvature of the spine.  Mild degenerative changes. IMPRESSION: No developing mass lesion, fluid collection or nodal enlargement. Specifically no liver mass identified at this time. 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 Centrally stable when adjusted for technique. 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 Electronically Signed   By: Adrianna Horde M.D.   On: 08/31/2023 13:07   CT ABDOMEN W CONTRAST Result Date: 08/31/2023 CLINICAL DATA:  Rectal cancer staging.  * Tracking Code: BO * EXAM: CT CHEST AND ABDOMEN WITH CONTRAST TECHNIQUE: Multidetector CT imaging of the chest and abdomen was performed following the standard protocol during bolus administration of intravenous contrast. RADIATION DOSE REDUCTION: This exam was performed according to the departmental dose-optimization program which includes automated exposure control, adjustment of the mA and/or kV according to patient size and/or use of iterative reconstruction technique. CONTRAST:  OMNIPAQUE  IOHEXOL  300 MG/ML  SOLN COMPARISON:  Abdomen pelvis CT 12/18/2022  and older. PET-CT scan 01/29/2022. Chest CT abdomen pelvis CT 01/08/2022. FINDINGS: CT CHEST FINDINGS Cardiovascular: Thoracic aorta is normal course and  caliber. There is a bovine arch identified, normal variant. Heart is nonenlarged. No significant pericardial effusion. Right IJ chest port in place. Tip of the catheter extends to the SVC right atrial junction. Mediastinum/Nodes: Preserved thyroid  gland. Patulous esophagus. No specific abnormal lymph node enlargement identified in the axillary regions, hilum or mediastinum. Lungs/Pleura: No consolidation, pneumothorax or effusion. Breathing motion. There is a small juxtapleural nodule right lower lobe laterally measuring 4 mm on series 3, image 92. This was present on the prior but appears smaller. This could relate to slice misregistration. No new dominant lung nodule. Musculoskeletal: Curvature of the spine with some mild degenerative changes. CT ABDOMEN FINDINGS Hepatobiliary: No focal liver abnormality is seen. No gallstones, gallbladder wall thickening, or biliary dilatation. Patent portal vein. Pancreas: Unremarkable. No pancreatic ductal dilatation or surrounding inflammatory changes. Spleen: Normal in size without focal abnormality. Adrenals/Urinary Tract: Adrenal glands are unremarkable. Kidneys are normal, without renal calculi, focal lesion, or hydronephrosis. Stomach/Bowel: Stomach is within normal limits. Appendix appears normal. No evidence of bowel wall thickening, distention, or inflammatory changes. Oral contrast was administered. Vascular/Lymphatic: Aortic atherosclerosis. No enlarged abdominal or pelvic lymph nodes. Other: No free air or free fluid. Musculoskeletal: Curvature of the spine.  Mild degenerative changes. IMPRESSION: No developing mass lesion, fluid collection or nodal enlargement. Specifically no liver mass identified at this time. 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 Centrally stable when adjusted for technique.  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 Electronically Signed   By: Adrianna Horde M.D.   On: 08/31/2023 13:07

## 2023-09-08 NOTE — Telephone Encounter (Signed)
 Thanks

## 2023-09-08 NOTE — Telephone Encounter (Signed)
 Flex sig was already noted in recall for 01/2024

## 2023-09-09 ENCOUNTER — Other Ambulatory Visit: Payer: Self-pay

## 2023-09-21 ENCOUNTER — Ambulatory Visit: Admitting: Student

## 2023-09-22 ENCOUNTER — Telehealth: Payer: Self-pay

## 2023-09-22 DIAGNOSIS — I471 Supraventricular tachycardia, unspecified: Secondary | ICD-10-CM

## 2023-09-22 NOTE — Telephone Encounter (Signed)
 Message received from patient to make a follow up appointment regarding heart racing and sweating.   Called and spoke with patient who stated that he has been having episodes of heart racing and sweating for the last 3-4 months accompanied by light headedness. Pt denies SOB or CP. Pt has a hx of SVT. Pt stated that he starts sweating when he eats certain things. Pt is diabetic and stated that he checks his blood sugars regularly. Pt stated that this week his blood sugar has remained under 150, however last week, his readings were high.   Patient has an appointment with Donivan Furry, NP in [place] on 09/30/23 at 4 pm.   Please advise.

## 2023-09-27 ENCOUNTER — Other Ambulatory Visit: Payer: Self-pay | Admitting: Family Medicine

## 2023-09-27 ENCOUNTER — Ambulatory Visit: Attending: Internal Medicine

## 2023-09-27 ENCOUNTER — Other Ambulatory Visit: Payer: Self-pay | Admitting: "Endocrinology

## 2023-09-27 DIAGNOSIS — I471 Supraventricular tachycardia, unspecified: Secondary | ICD-10-CM

## 2023-09-27 DIAGNOSIS — E119 Type 2 diabetes mellitus without complications: Secondary | ICD-10-CM

## 2023-09-27 NOTE — Telephone Encounter (Signed)
 Copied from CRM [id number]. Topic: Clinical - Medication Refill >> Sep 27, 2023  2:03 PM Bearl Botts E wrote: Medication:  metFORMIN  (GLUCOPHAGE ) 500 MG tablet   Has the patient contacted their pharmacy? Yes (Agent: If no, request that the patient contact the pharmacy for the refill. If patient does not wish to contact the pharmacy document the reason why and proceed with request.) (Agent: If yes, when and what did the pharmacy advise?)  This is the patient's preferred pharmacy:  [facility] [address], [place] - [address] [place] [address] Phone: [phone] Fax: [phone]  Is this the correct pharmacy for this prescription? Yes If no, delete pharmacy and type the correct one.   Has the prescription been filled recently? Yes  Is the patient out of the medication? Yes  Has the patient been seen for an appointment in the last year OR does the patient have an upcoming appointment? Yes  Can we respond through MyChart? Yes  Agent: Please be advised that Rx refills may take up to 3 business days. We ask that you follow-up with your pharmacy.

## 2023-09-27 NOTE — Addendum Note (Signed)
 Addended by: Geralyn Figiel A on: 09/27/2023 11:21 AM   Modules accepted: Orders

## 2023-09-27 NOTE — Telephone Encounter (Signed)
 Zio will be mailed to pt,patient aware

## 2023-09-30 ENCOUNTER — Ambulatory Visit: Admitting: Nurse Practitioner

## 2023-10-12 ENCOUNTER — Other Ambulatory Visit: Payer: Self-pay | Admitting: Family Medicine

## 2023-10-12 DIAGNOSIS — H6122 Impacted cerumen, left ear: Secondary | ICD-10-CM | POA: Diagnosis not present

## 2023-10-12 DIAGNOSIS — K219 Gastro-esophageal reflux disease without esophagitis: Secondary | ICD-10-CM

## 2023-10-12 DIAGNOSIS — H6501 Acute serous otitis media, right ear: Secondary | ICD-10-CM | POA: Diagnosis not present

## 2023-10-20 ENCOUNTER — Encounter (HOSPITAL_COMMUNITY): Payer: Self-pay

## 2023-10-20 ENCOUNTER — Emergency Department (HOSPITAL_COMMUNITY)
Admission: EM | Admit: 2023-10-20 | Discharge: 2023-10-20 | Disposition: A | Discharge status: Home / Self Care | Attending: Emergency Medicine | Admitting: Emergency Medicine

## 2023-10-20 ENCOUNTER — Other Ambulatory Visit: Payer: Self-pay

## 2023-10-20 DIAGNOSIS — H6690 Otitis media, unspecified, unspecified ear: Secondary | ICD-10-CM

## 2023-10-20 DIAGNOSIS — H6693 Otitis media, unspecified, bilateral: Secondary | ICD-10-CM | POA: Insufficient documentation

## 2023-10-20 DIAGNOSIS — H9203 Otalgia, bilateral: Secondary | ICD-10-CM | POA: Diagnosis present

## 2023-10-20 MED ORDER — IBUPROFEN 800 MG PO TABS
800.0000 mg | ORAL_TABLET | Freq: Once | ORAL | Status: AC
  Administered 2023-10-20: 800 mg via ORAL
  Filled 2023-10-20: qty 1

## 2023-10-20 MED ORDER — AMOXICILLIN-POT CLAVULANATE 875-125 MG PO TABS
1.0000 | ORAL_TABLET | Freq: Once | ORAL | Status: AC
  Administered 2023-10-20: 1 via ORAL
  Filled 2023-10-20: qty 1

## 2023-10-20 MED ORDER — IBUPROFEN 800 MG PO TABS: 800.0000 mg | ORAL_TABLET | Freq: Three times a day (TID) | ORAL | 0 refills | Status: AC

## 2023-10-20 MED ORDER — AMOXICILLIN-POT CLAVULANATE 875-125 MG PO TABS: 1.0000 | ORAL_TABLET | Freq: Two times a day (BID) | ORAL | 0 refills | Status: DC

## 2023-10-20 NOTE — Discharge Instructions (Signed)
 Take the antibiotic as directed until finished.  Follow-up with your primary care provider for recheck

## 2023-10-20 NOTE — ED Triage Notes (Signed)
 Pt to ED c/o bilateral ears stopped up, draining, and painful, sx started last week

## 2023-10-20 NOTE — Progress Notes (Signed)
 Discharge instructions explained. VSS.

## 2023-10-22 NOTE — ED Provider Notes (Signed)
 [facility] EMERGENCY DEPARTMENT AT [facility] Provider Note   CSN: [id number] Arrival date & time: 10/20/23  1942     Patient presents with: Raymond Malone is a 48 y.o. male.    Otalgia Associated symptoms: no abdominal pain, no cough, no diarrhea, no fever, no headaches, no neck pain, no rash and no vomiting         Pt here with complaint of bilateral ear pain.  Symptoms for few days.  Describes pain and pressure sensation to both ears.  Left greater than right.  His daughter is also here with similar symptoms.  Pt denies cough, chest pain dyspnea, fever, and headache.  No known Covid exposures recently.   Prior to Admission medications   Medication Sig Start Date End Date Taking? Authorizing Provider  amoxicillin -clavulanate (AUGMENTIN ) 875-125 MG tablet Take 1 tablet by mouth every 12 (twelve) hours. 10/20/23  Yes Davi Kroon, PA-C  ibuprofen  (ADVIL ) 800 MG tablet Take 1 tablet (800 mg total) by mouth 3 (three) times daily. Take with food 10/20/23  Yes Robertson Colclough, PA-C  Accu-Chek Softclix Lancets lancets 1 each by Other route 2 (two) times daily. Use as instructed 12/09/21   Nida, Gebreselassie W, MD  alfuzosin  (UROXATRAL ) 10 MG 24 hr tablet Take 1 tablet (10 mg total) by mouth at bedtime. 07/30/23   McKenzie, Arden Beck, MD  blood glucose meter kit and supplies Dispense based on patient and insurance preference. Use up to four times daily as directed. (FOR ICD-10 E10.9, E11.9). 02/10/21   Artemio Larry, NP  Blood Glucose Monitoring Suppl (ACCU-CHEK GUIDE ME) w/Device KIT Use to test BG bid. E11.65 12/22/22   Nida, Gebreselassie W, MD  dapagliflozin  propanediol (FARXIGA ) 10 MG TABS tablet Take 1 tablet (10 mg total) by mouth daily. 07/26/23   Zarwolo, Gloria, FNP  docusate sodium  (COLACE) 100 MG capsule Take 1 capsule (100 mg total) by mouth 2 (two) times daily. 06/04/23   Kandala, Hyndavi, MD  gabapentin  (NEURONTIN ) 300 MG capsule Take 2 capsules (600 mg  total) by mouth 3 (three) times daily. 09/08/23   Paulett Boros, MD  glucose blood (ACCU-CHEK GUIDE) test strip USE 1 STRIP TO TEST GLUCOSE TWICE DAILY E11.65 12/22/22   Nida, Gebreselassie W, MD  hydrocortisone -pramoxine (PROCTOFOAM-HC) rectal foam Place 1 applicator rectally 2 (two) times daily. 10/22/22   Paulett Boros, MD  lactulose  (CHRONULAC ) 10 GM/15ML solution Take 15 mLs (10 g total) by mouth at bedtime. If Colace is not working or 3 days w/o BM Take 15mL at bedtime every night to assist with regular bowel movements. Titrate down if having multiple bowel movements. If a bowel movement has not occurred in 3-4 days or longer take 15mL every 3 hours until a bowel movement has occurred. 06/09/23   Paulett Boros, MD  lisinopril -hydrochlorothiazide  (ZESTORETIC ) 20-25 MG tablet Take 1 tablet by mouth daily. 06/04/23   Aurther Blue, NP  metFORMIN  (GLUCOPHAGE ) 500 MG tablet TAKE 1 TABLET BY MOUTH TWICE DAILY WITH A MEAL 09/27/23   Nida, Gebreselassie W, MD  pantoprazole  (PROTONIX ) 40 MG tablet Take 1 tablet by mouth once daily 10/12/23   Zarwolo, Gloria, FNP  pyridOXINE (VITAMIN B6) 50 MG tablet Take 1 tablet (50 mg total) by mouth daily. 07/26/23   Zarwolo, Gloria, FNP  senna (SENOKOT) 8.6 MG TABS tablet Take 1 tablet (8.6 mg total) by mouth daily. 06/04/23   Kandala, Hyndavi, MD  sildenafil  (VIAGRA ) 100 MG tablet Take 0.5 tablets (50 mg total)  by mouth daily as needed for erectile dysfunction. Erectile dysfunction: recommended to take 1 hour prior to sexual activity; do not take >1 dose per day 02/15/23   Zarwolo, Gloria, FNP  simvastatin  (ZOCOR ) 40 MG tablet Take 1 tablet (40 mg total) by mouth every evening. 07/26/23   Zarwolo, Gloria, FNP  tadalafil  (CIALIS ) 20 MG tablet Take 1 tablet (20 mg total) by mouth as needed. 05/24/23   McKenzie, Arden Beck, MD  Testosterone  1.62 % GEL Place 2 Pump onto the skin daily. 07/14/23   McKenzie, Arden Beck, MD  Vitamin D , Ergocalciferol , (DRISDOL ) 1.25  MG (50000 UNIT) CAPS capsule Take 1 capsule (50,000 Units total) by mouth every 7 (seven) days. 07/26/23   Zarwolo, Gloria, FNP    Allergies: Patient has no known allergies.    Review of Systems  Constitutional:  Negative for appetite change, chills and fever.  HENT:  Positive for ear pain and sneezing. Negative for trouble swallowing.   Eyes:  Negative for visual disturbance.  Respiratory:  Negative for cough, shortness of breath and wheezing.   Cardiovascular:  Negative for chest pain.  Gastrointestinal:  Negative for abdominal pain, diarrhea, nausea and vomiting.  Musculoskeletal:  Negative for arthralgias, myalgias, neck pain and neck stiffness.  Skin:  Negative for rash.  Neurological:  Negative for dizziness, light-headedness and headaches.    Updated Vital Signs BP 121/76 (BP Location: Right Arm)   Pulse 78   Temp 97.6 F (36.4 C) (Oral)   Resp 20   Ht 5' 11 (1.803 m)   Wt 102.1 kg   SpO2 100%   BMI 31.38 kg/m   Physical Exam Vitals and nursing note reviewed.  Constitutional:      General: He is not in acute distress.    Appearance: Normal appearance. He is not ill-appearing or toxic-appearing.  HENT:     Right Ear: Ear canal normal. No decreased hearing noted. Tenderness present. No drainage or swelling. No mastoid tenderness. No hemotympanum. Tympanic membrane is not erythematous or bulging.     Left Ear: Ear canal normal. No decreased hearing noted. Tenderness present. No drainage or swelling. No mastoid tenderness. No hemotympanum. Tympanic membrane is erythematous. Tympanic membrane is not bulging.     Nose: No rhinorrhea.     Mouth/Throat:     Mouth: Mucous membranes are moist.     Pharynx: Oropharynx is clear. No posterior oropharyngeal erythema.   Eyes:     Conjunctiva/sclera: Conjunctivae normal.     Pupils: Pupils are equal, round, and reactive to light.    Cardiovascular:     Rate and Rhythm: Normal rate and regular rhythm.     Pulses: Normal pulses.   Pulmonary:     Effort: Pulmonary effort is normal. No respiratory distress.     Breath sounds: No wheezing.  Abdominal:     Palpations: Abdomen is soft.   Musculoskeletal:        General: Normal range of motion.   Skin:    General: Skin is warm.     Capillary Refill: Capillary refill takes less than 2 seconds.   Neurological:     Mental Status: He is alert.     (all labs ordered are listed, but only abnormal results are displayed) Labs Reviewed - No data to display  EKG: None  Radiology: No results found.   Procedures   Medications Ordered in the ED  amoxicillin -clavulanate (AUGMENTIN ) 875-125 MG per tablet 1 tablet (1 tablet Oral Given 10/20/23 2124)  ibuprofen  (ADVIL ) tablet  800 mg (800 mg Oral Given 10/20/23 2124)                                    Medical Decision Making Pt here for evalation of bilateral ear pain for few days.  No fever, neck pain, cough, CP or shortness of breath.  His daughter also her for eval.  No reported trauma to his ear or recent swimming.     Pt is playing video game on ipad during my exam  Some erythema to left TM no bulging or perforation,  no edema or drainage in the canal.      Amount and/or Complexity of Data Reviewed Discussion of management or test interpretation with external provider(s): No indication for labs,  doubt emergent process.  Will treat with abx, ibuprofen  for pain .  Pt to f/u with PCP if needed  Risk Prescription drug management.        Final diagnoses:  Acute otitis media, unspecified otitis media type    ED Discharge Orders          Ordered    amoxicillin -clavulanate (AUGMENTIN ) 875-125 MG tablet  Every 12 hours        10/20/23 2124    ibuprofen  (ADVIL ) 800 MG tablet  3 times daily        10/20/23 2124               Catherne Clubs, PA-C 10/22/23 1722    Merdis Stalling, MD 10/23/23 [phone]

## 2023-10-26 ENCOUNTER — Telehealth: Payer: Self-pay | Admitting: Family Medicine

## 2023-10-26 NOTE — Telephone Encounter (Signed)
 Patient had eye exam done in [place] does not remember where it was but will call back or send MyChart message if he remembers.

## 2023-11-02 ENCOUNTER — Encounter: Payer: Self-pay | Admitting: Hematology

## 2023-11-03 ENCOUNTER — Other Ambulatory Visit

## 2023-11-03 ENCOUNTER — Ambulatory Visit (INDEPENDENT_AMBULATORY_CARE_PROVIDER_SITE_OTHER): Admitting: "Endocrinology

## 2023-11-03 ENCOUNTER — Encounter: Payer: Self-pay | Admitting: Hematology

## 2023-11-03 ENCOUNTER — Encounter: Payer: Self-pay | Admitting: "Endocrinology

## 2023-11-03 VITALS — BP 118/64 | HR 84 | Ht 71.0 in | Wt 232.8 lb

## 2023-11-03 DIAGNOSIS — E6609 Other obesity due to excess calories: Secondary | ICD-10-CM | POA: Diagnosis not present

## 2023-11-03 DIAGNOSIS — E291 Testicular hypofunction: Secondary | ICD-10-CM

## 2023-11-03 DIAGNOSIS — E66811 Obesity, class 1: Secondary | ICD-10-CM

## 2023-11-03 DIAGNOSIS — Z6832 Body mass index (BMI) 32.0-32.9, adult: Secondary | ICD-10-CM | POA: Diagnosis not present

## 2023-11-03 DIAGNOSIS — E782 Mixed hyperlipidemia: Secondary | ICD-10-CM

## 2023-11-03 DIAGNOSIS — Z7984 Long term (current) use of oral hypoglycemic drugs: Secondary | ICD-10-CM

## 2023-11-03 DIAGNOSIS — E1165 Type 2 diabetes mellitus with hyperglycemia: Secondary | ICD-10-CM | POA: Insufficient documentation

## 2023-11-03 DIAGNOSIS — I10 Essential (primary) hypertension: Secondary | ICD-10-CM | POA: Diagnosis not present

## 2023-11-03 MED ORDER — GLIPIZIDE ER 5 MG PO TB24: 5.0000 mg | ORAL_TABLET | Freq: Every day | ORAL | 3 refills | Status: DC

## 2023-11-03 NOTE — Patient Instructions (Signed)

## 2023-11-03 NOTE — Progress Notes (Signed)
 11/03/2023, 5:11 PM   Endocrinology follow-up note  Subjective:    Patient ID: Raymond Malone, male    DOB: 1976-02-09.  Raymond Malone is being seen in follow-up after he was seen in consultation for management of currently uncontrolled symptomatic diabetes requested by  Zarwolo, Gloria, FNP.   Past Medical History:  Diagnosis Date   Abscess, axilla 10/06/2016   Alcohol dependence (HCC) 10/06/2016   Current smoker 10/06/2016   Dyshidrotic eczema 10/27/2016   GERD (gastroesophageal reflux disease)    Hyperlipidemia    Hyperosmolar non-ketotic state in patient with type 2 diabetes mellitus (HCC) 10/06/2016   Hypertension    MRSA (methicillin resistant Staphylococcus aureus)    Skin abscess    Type 2 diabetes mellitus (HCC)     Past Surgical History:  Procedure Laterality Date   ANKLE SURGERY Left    BIOPSY  01/06/2022   Procedure: BIOPSY;  Surgeon: Eartha Angelia Sieving, MD;  [facility];  Service: Gastroenterology;;   BIOPSY  07/16/2022   Procedure: BIOPSY;  Surgeon: Eartha Angelia Sieving, MD;  [facility];  Service: Gastroenterology;;   BIOPSY  01/07/2023   Procedure: BIOPSY;  Surgeon: Eartha Angelia Sieving, MD;  [facility];  Service: Gastroenterology;;   COLONOSCOPY N/A 08/18/2023   Procedure: COLONOSCOPY;  Surgeon: Eartha Angelia Sieving, MD;  [facility];  Service: Gastroenterology;  Laterality: N/A;  10:45AM;ASA 1   COLONOSCOPY WITH PROPOFOL  N/A 01/06/2022   Procedure: COLONOSCOPY WITH PROPOFOL ;  Surgeon: Eartha Angelia Sieving, MD;  [facility];  Service: Gastroenterology;  Laterality: N/A;  730   FLEXIBLE SIGMOIDOSCOPY N/A 07/16/2022   Procedure: FLEXIBLE SIGMOIDOSCOPY;  Surgeon: Eartha Angelia Sieving, MD;  [facility];  Service: Gastroenterology;  Laterality: N/A;  215pm, asa 1-2   FLEXIBLE SIGMOIDOSCOPY N/A 01/07/2023   Procedure: FLEXIBLE SIGMOIDOSCOPY;   Surgeon: Eartha Angelia Sieving, MD;  [facility];  Service: Gastroenterology;  Laterality: N/A;  10:15AM;ASA 1   HAND SURGERY     boxer's fracture L hand   INCISION AND DRAINAGE PERIRECTAL ABSCESS     IR IMAGING GUIDED PORT INSERTION  02/10/2022   MYRINGOTOMY WITH TUBE PLACEMENT Bilateral 07/22/2020   Procedure: BILATERAL MYRINGOTOMY WITH TUBE PLACEMENT;  Surgeon: Karis Clunes, MD;  Location: [facility];  Service: ENT;  Laterality: Bilateral;   POLYPECTOMY  01/06/2022   Procedure: POLYPECTOMY INTESTINAL;  Surgeon: Eartha Angelia Sieving, MD;  [facility];  Service: Gastroenterology;;   POLYPECTOMY  08/18/2023   Procedure: POLYPECTOMY, INTESTINE;  Surgeon: Eartha Angelia Sieving, MD;  [facility]: [facility];  Service: Gastroenterology;;   SVT ABLATION N/A 04/16/2021   Procedure: SVT ABLATION;  Surgeon: Waddell Danelle ORN, MD;  Location: [facility];  Service: Cardiovascular;  Laterality: N/A;    Social History   Socioeconomic History   Marital status: Single    Spouse name: Shameka   Number of children: 7   Years of education: 14   Highest education level: Not on file  Occupational History   Occupation: factory work, sorting  Tobacco Use   Smoking status: Former    Current packs/day: 1.00    Average packs/day: 1 pack/day for 29.5 years (29.5 ttl pk-yrs)    Types: Cigarettes    Start date: 05/11/1994   Smokeless tobacco: Never  Vaping Use   Vaping status: Every Day   Substances: Nicotine , Flavoring  Substance and Sexual Activity  Alcohol use: Not Currently   Drug use: Not Currently    Types: Marijuana   Sexual activity: Yes    Birth control/protection: Condom  Other Topics Concern   Not on file  Social History Narrative   Lives with fiancee, mother, and 7 children, 3 are biological his, 2 are hers and 1 nephew   No pets in the home      Enjoys: spending time with the kids      Diet: Eats all food groups, does not have a  true focus for diabetes   Caffeine: Drinks about 1 or 2 sodas a week overall has reduced his caffeine intake sometimes some tea   Water : Reports taking 6-8 times a day for more      Wears a seatbelt, does not use his phone or driving   Smoke detectors at home   Does not have fire extinguisher at this time   No weapons in the home   Social Drivers of Health   Financial Resource Strain: Low Risk  (02/09/2020)   Overall Financial Resource Strain (CARDIA)    Difficulty of Paying Living Expenses: Not hard at all  Food Insecurity: No Food Insecurity (02/09/2020)   Hunger Vital Sign    Worried About Running Out of Food in the Last Year: Never true    Ran Out of Food in the Last Year: Never true  Transportation Needs: No Transportation Needs (02/09/2020)   PRAPARE - Administrator, Civil Service (Medical): No    Lack of Transportation (Non-Medical): No  Physical Activity: Inactive (02/09/2020)   Exercise Vital Sign    Days of Exercise per Week: 0 days    Minutes of Exercise per Session: 0 min  Stress: Stress Concern Present (02/09/2020)   [employer] of Occupational Health - Occupational Stress Questionnaire    Feeling of Stress : To some extent  Social Connections: Moderately Isolated (02/09/2020)   Social Connection and Isolation Panel    Frequency of Communication with Friends and Family: More than three times a week    Frequency of Social Gatherings with Friends and Family: More than three times a week    Attends Religious Services: Never    Database administrator or Organizations: No    Attends Banker Meetings: Never    Marital Status: Living with partner    Family History  Problem Relation Age of Onset   Hypertension Mother     Outpatient Encounter Medications as of 11/03/2023  Medication Sig   glipiZIDE  (GLUCOTROL  XL) 5 MG 24 hr tablet Take 1 tablet (5 mg total) by mouth daily with breakfast.   Accu-Chek Softclix Lancets lancets 1 each by Other  route 2 (two) times daily. Use as instructed   alfuzosin  (UROXATRAL ) 10 MG 24 hr tablet Take 1 tablet (10 mg total) by mouth at bedtime.   amoxicillin -clavulanate (AUGMENTIN ) 875-125 MG tablet Take 1 tablet by mouth every 12 (twelve) hours. (Patient not taking: Reported on 11/03/2023)   blood glucose meter kit and supplies Dispense based on patient and insurance preference. Use up to four times daily as directed. (FOR ICD-10 E10.9, E11.9).   Blood Glucose Monitoring Suppl (ACCU-CHEK GUIDE ME) w/Device KIT Use to test BG bid. E11.65   docusate sodium  (COLACE) 100 MG capsule Take 1 capsule (100 mg total) by mouth 2 (two) times daily.   gabapentin  (NEURONTIN ) 300 MG capsule Take 2 capsules (600 mg total) by mouth 3 (three) times daily.   glucose blood (ACCU-CHEK  GUIDE) test strip USE 1 STRIP TO TEST GLUCOSE TWICE DAILY E11.65   hydrocortisone -pramoxine (PROCTOFOAM-HC) rectal foam Place 1 applicator rectally 2 (two) times daily.   ibuprofen  (ADVIL ) 800 MG tablet Take 1 tablet (800 mg total) by mouth 3 (three) times daily. Take with food   lactulose  (CHRONULAC ) 10 GM/15ML solution Take 15 mLs (10 g total) by mouth at bedtime. If Colace is not working or 3 days w/o BM Take 15mL at bedtime every night to assist with regular bowel movements. Titrate down if having multiple bowel movements. If a bowel movement has not occurred in 3-4 days or longer take 15mL every 3 hours until a bowel movement has occurred.   lisinopril -hydrochlorothiazide  (ZESTORETIC ) 20-25 MG tablet Take 1 tablet by mouth daily.   metFORMIN  (GLUCOPHAGE ) 500 MG tablet TAKE 1 TABLET BY MOUTH TWICE DAILY WITH A MEAL   pantoprazole  (PROTONIX ) 40 MG tablet Take 1 tablet by mouth once daily   pyridOXINE (VITAMIN B6) 50 MG tablet Take 1 tablet (50 mg total) by mouth daily.   senna (SENOKOT) 8.6 MG TABS tablet Take 1 tablet (8.6 mg total) by mouth daily.   sildenafil  (VIAGRA ) 100 MG tablet Take 0.5 tablets (50 mg total) by mouth daily as needed for  erectile dysfunction. Erectile dysfunction: recommended to take 1 hour prior to sexual activity; do not take >1 dose per day   simvastatin  (ZOCOR ) 40 MG tablet Take 1 tablet (40 mg total) by mouth every evening.   tadalafil  (CIALIS ) 20 MG tablet Take 1 tablet (20 mg total) by mouth as needed.   Testosterone  1.62 % GEL Place 2 Pump onto the skin daily.   Vitamin D , Ergocalciferol , (DRISDOL ) 1.25 MG (50000 UNIT) CAPS capsule Take 1 capsule (50,000 Units total) by mouth every 7 (seven) days.   [DISCONTINUED] dapagliflozin  propanediol (FARXIGA ) 10 MG TABS tablet Take 1 tablet (10 mg total) by mouth daily.   No facility-administered encounter medications on file as of 11/03/2023.    ALLERGIES: No Known Allergies  VACCINATION STATUS: Immunization History  Administered Date(s) Administered   Influenza,inj,Quad PF,6+ Mos 02/09/2020, 02/10/2021, 02/03/2022   Pneumococcal Polysaccharide-23 10/07/2016   Tdap 10/19/2012    Diabetes He presents for his follow-up diabetic visit. He has type 2 diabetes mellitus. Onset time: He was diagnosed at approximate age of 41 years. His disease course has been worsening (He was seen in 2018 immediately after his diagnosis, separate from care.). There are no hypoglycemic associated symptoms. Pertinent negatives for hypoglycemia include no confusion, headaches, pallor or seizures. Pertinent negatives for diabetes include no blurred vision, no chest pain, no fatigue, no polydipsia, no polyphagia, no polyuria and no weakness. There are no hypoglycemic complications. Symptoms are worsening. (Obesity, smoking.) Risk factors for coronary artery disease include dyslipidemia, hypertension and obesity. His weight is increasing steadily. He is following a generally unhealthy diet. When asked about meal planning, he reported none. He has not had a previous visit with a dietitian. He rarely participates in exercise. His home blood glucose trend is increasing steadily. His breakfast  blood glucose range is generally 180-200 mg/dl. His bedtime blood glucose range is generally 180-200 mg/dl. His overall blood glucose range is 180-200 mg/dl. (Raymond Malone presents with average blood glucose of 195.  His point-of-care A1c is 7.8% increasing from 6.7%.    He did not document hypoglycemia.  ) An ACE inhibitor/angiotensin II receptor blocker is being taken.  Hyperlipidemia This is a chronic problem. The problem is resistant. Exacerbating diseases include diabetes and obesity. Pertinent  negatives include no chest pain, myalgias or shortness of breath. Current antihyperlipidemic treatment includes statins. Risk factors for coronary artery disease include diabetes mellitus, dyslipidemia, family history, hypertension, male sex, obesity and a sedentary lifestyle.  Hypertension This is a chronic problem. The current episode started more than 1 year ago. Pertinent negatives include no blurred vision, chest pain, headaches, neck pain, palpitations or shortness of breath. Risk factors for coronary artery disease include dyslipidemia, diabetes mellitus, male gender, obesity, sedentary lifestyle, smoking/tobacco exposure and family history. Past treatments include ACE inhibitors.     Objective:       11/03/2023    3:29 PM 10/20/2023   10:24 PM 10/20/2023    7:48 PM  Vitals with BMI  Height 5' 11  5' 11  Weight 232 lbs 13 oz  225 lbs  BMI 32.48  31.39  Systolic 118 121   Diastolic 64 76   Pulse 84 78     BP 118/64   Pulse 84   Ht 5' 11 (1.803 m)   Wt 232 lb 12.8 oz (105.6 kg)   BMI 32.47 kg/m   Wt Readings from Last 3 Encounters:  11/03/23 232 lb 12.8 oz (105.6 kg)  10/20/23 225 lb (102.1 kg)  09/08/23 222 lb 10.6 oz (101 kg)        CMP ( most recent) CMP     Component Value Date/Time   NA 133 (L) 08/30/2023 1508   NA 136 06/23/2023 0846   K 3.5 08/30/2023 1508   CL 99 08/30/2023 1508   CO2 24 08/30/2023 1508   GLUCOSE 132 (H) 08/30/2023 1508   BUN 12 08/30/2023 1508    BUN 14 06/23/2023 0846   CREATININE 0.87 08/30/2023 1508   CREATININE 1.02 02/08/2017 0852   CALCIUM  9.3 08/30/2023 1508   PROT 7.1 08/30/2023 1508   PROT 6.8 06/23/2023 0846   ALBUMIN 4.1 08/30/2023 1508   ALBUMIN 4.5 06/23/2023 0846   AST 22 08/30/2023 1508   ALT 29 08/30/2023 1508   ALKPHOS 65 08/30/2023 1508   BILITOT 0.7 08/30/2023 1508   BILITOT 0.6 06/23/2023 0846   GFRNONAA >60 08/30/2023 1508   GFRAA 114 02/09/2020 1013     Diabetic Labs (most recent): Lab Results  Component Value Date   HGBA1C 7.0 (H) 06/23/2023   HGBA1C 7.1 (H) 03/02/2023   HGBA1C 6.7 09/17/2022   MICROALBUR 0.4 02/08/2017     Lipid Panel ( most recent) Lipid Panel     Component Value Date/Time   CHOL 153 06/23/2023 0846   TRIG 72 06/23/2023 0846   HDL 47 06/23/2023 0846   CHOLHDL 3.3 06/23/2023 0846   CHOLHDL 4.3 02/08/2017 0852   LDLCALC 92 06/23/2023 0846   LDLCALC 108 (H) 02/08/2017 0852   LABVLDL 14 06/23/2023 0846      Lab Results  Component Value Date   TSH 1.380 06/23/2023   TSH 0.882 06/01/2023   TSH 1.280 03/02/2023   TSH 1.130 11/21/2021   TSH 1.030 06/10/2021   TSH 1.412 11/28/2018   TSH 2.00 02/08/2017   FREET4 1.65 06/23/2023   FREET4 1.71 03/02/2023   FREET4 1.47 11/21/2021   FREET4 1.50 06/10/2021   FREET4 1.3 02/08/2017      Assessment & Plan:   1. Type 2 diabetes mellitus without complication, with long-term current use of insulin  (HCC)   - Raymond Malone has currently uncontrolled symptomatic type 2 DM since  48 years of age.  Raymond Malone presents with average blood glucose of  195.  His point-of-care A1c is 7.8% increasing from 6.7%.    He did not document hypoglycemia.    Recent labs reviewed. - I had a long discussion with him about the progressive nature of diabetes and the pathology behind its complications.  He was seen in this practice in 2018, disappeared from care. -his diabetes is complicated by obesity/sedentary life, smoking, hypertension,  hyperlipidemia, nonadherence and he remains at a high risk for more acute and chronic complications which include CAD, CVA, CKD, retinopathy, and neuropathy. These are all discussed in detail with him.  - I have counseled him on diet  and weight management  by adopting a carbohydrate restricted/protein rich diet. Patient is encouraged to switch to  unprocessed or minimally processed     complex starch and increased protein intake (animal or plant source), fruits, and vegetables. -  he is advised to stick to a routine mealtimes to eat 3 meals  a day and avoid unnecessary snacks ( to snack only to correct hypoglycemia).   He seems to have engaged and benefiting from lifestyle medicine.  - he reports reasonable engagement in lifestyle medicine, and patient promises to continue the course.   - he acknowledges that there is a room for improvement in his food and drink choices. - Suggestion is made for him to avoid simple carbohydrates  from his diet including Cakes, Sweet Desserts, Ice Cream, Soda (diet and regular), Sweet Tea, Candies, Chips, Cookies, Store Bought Juices, Alcohol , Artificial Sweeteners,  Coffee Creamer, and Sugar-free Products, Lemonade. This will help patient to have more stable blood glucose profile and potentially avoid unintended weight gain.  The following Lifestyle Medicine recommendations according to American College of Lifestyle Medicine  [facility]) were discussed and and offered to patient and he  agrees to start the journey:  A. Whole Foods, Plant-Based Nutrition comprising of fruits and vegetables, plant-based proteins, whole-grain carbohydrates was discussed in detail with the patient.   A list for source of those nutrients were also provided to the patient.  Patient will use only water  or unsweetened tea for hydration. B.  The need to stay away from risky substances including alcohol, smoking; obtaining 7 to 9 hours of restorative sleep, at least 150 minutes of moderate  intensity exercise weekly, the importance of healthy social connections,  and stress management techniques were discussed. C.  A full color page of  Calorie density of various food groups per pound showing examples of each food groups was provided to the patient.   - he will be scheduled with Penny Crumpton, RDN, CDE for diabetes education.  - I have approached him with the following individualized plan to manage  his diabetes and patient agrees:   -In light of his presentation with above target glycemic profile, he will need additional intervention.  His insurance did not provide coverage for SGLT2 inhibitors.    He is advised to continue metformin  500 mg p.o. twice daily with meals.  I discussed and added glipizide  5 mg XL p.o. daily at breakfast.   He is willing and advised to continue monitoring blood glucose twice a day-daily before breakfast and at bedtime.  - he will be considered for incretin therapy as appropriate next visit.  - Specific targets for  A1c;  LDL, HDL,  and Triglycerides were discussed with the patient.  2) Blood Pressure /Hypertension:  His blood pressure is controlled to target.  He is advised to discontinue metoprolol , continue lisinopril /HCTZ 20/25 mg p.o. daily at breakfast.  3) Lipids/Hyperlipidemia:   Review of his recent lipid panel showed improving LDL at 68, overall improving from 108.   He is advised to continue simvastatin  40 mg p.o. daily at bedtime.   Side effects and precautions discussed with him.  4)  Weight/Diet:  Body mass index is 32.47 kg/m.  -   clearly complicating his diabetes care.   he is  a candidate for weight loss. I discussed with him the fact that loss of 5 - 10% of his  current body weight will have the most impact on his diabetes management.  Exercise, and detailed carbohydrates information provided  -  detailed on discharge instructions.  5) Chronic Care/Health Maintenance:  -he  is on ACEI/ARB and Statin medications and  is  encouraged to initiate and continue to follow up with Ophthalmology, Dentist,  Podiatrist at least yearly or according to recommendations, and stay away from  smoking. I have recommended yearly flu vaccine and pneumonia vaccine at least every 5 years; moderate intensity exercise for up to 150 minutes weekly; and  sleep for at least 7 hours a day.  He has successfully quit smoking since last visit. - he is  advised to maintain close follow up with Zarwolo, Gloria, FNP for primary care needs, as well as his other providers for optimal and coordinated care.  I spent  26  minutes in the care of the patient today including review of labs from CMP, Lipids, Thyroid  Function, Hematology (current and previous including abstractions from other facilities); face-to-face time discussing  his blood glucose readings/logs, discussing hypoglycemia and hyperglycemia episodes and symptoms, medications doses, his options of short and long term treatment based on the latest standards of care / guidelines;  discussion about incorporating lifestyle medicine;  and documenting the encounter. Risk reduction counseling performed per USPSTF guidelines to reduce  obesity and cardiovascular risk factors.     Please refer to Patient Instructions for Blood Glucose Monitoring and Insulin /Medications Dosing Guide  in media tab for additional information. Please  also refer to  Patient Self Inventory in the Media  tab for reviewed elements of pertinent patient history.  Raymond Malone participated in the discussions, expressed understanding, and voiced agreement with the above plans.  All questions were answered to his satisfaction. he is encouraged to contact clinic should he have any questions or concerns prior to his return visit.   Follow up plan: - Return in about 6 months (around 05/04/2024) for Fasting Labs  in AM B4 8, A1c -NV, Urine MA - NV.  Ranny Earl, MD [facility] [address] [place], [place] [address] Phone: [phone]  Fax: [phone]    11/03/2023, 5:11 PM  This note was partially dictated with voice recognition software. Similar sounding words can be transcribed inadequately or may not  be corrected upon review.

## 2023-11-04 ENCOUNTER — Other Ambulatory Visit: Payer: Self-pay

## 2023-11-05 ENCOUNTER — Ambulatory Visit: Admitting: Urology

## 2023-11-05 ENCOUNTER — Encounter: Payer: Self-pay | Admitting: Hematology

## 2023-11-05 LAB — COMPREHENSIVE METABOLIC PANEL WITH GFR
ALT: 31 IU/L (ref 0–44)
AST: 23 IU/L (ref 0–40)
Albumin: 4.5 g/dL (ref 4.1–5.1)
Alkaline Phosphatase: 87 IU/L (ref 44–121)
BUN/Creatinine Ratio: 14 (ref 9–20)
BUN: 12 mg/dL (ref 6–24)
Bilirubin Total: 0.9 mg/dL (ref 0.0–1.2)
CO2: 21 mmol/L (ref 20–29)
Calcium: 9.9 mg/dL (ref 8.7–10.2)
Chloride: 96 mmol/L (ref 96–106)
Creatinine, Ser: 0.84 mg/dL (ref 0.76–1.27)
Globulin, Total: 2.2 g/dL (ref 1.5–4.5)
Glucose: 173 mg/dL — ABNORMAL HIGH (ref 70–99)
Potassium: 4.6 mmol/L (ref 3.5–5.2)
Sodium: 134 mmol/L (ref 134–144)
Total Protein: 6.7 g/dL (ref 6.0–8.5)
eGFR: 108 mL/min/{1.73_m2} (ref >59)

## 2023-11-05 LAB — CBC
Hematocrit: 44.4 % (ref 37.5–51.0)
Hemoglobin: 14.7 g/dL (ref 13.0–17.7)
MCH: 30.5 pg (ref 26.6–33.0)
MCHC: 33.1 g/dL (ref 31.5–35.7)
MCV: 92 fL (ref 79–97)
Platelets: 299 10*3/uL (ref 150–450)
RBC: 4.82 x10E6/uL (ref 4.14–5.80)
RDW: 12.6 % (ref 11.6–15.4)
WBC: 4.9 10*3/uL (ref 3.4–10.8)

## 2023-11-05 LAB — TESTOSTERONE,FREE AND TOTAL
Testosterone, Free: 5.3 pg/mL — ABNORMAL LOW (ref 6.8–21.5)
Testosterone: 227 ng/dL — ABNORMAL LOW (ref 264–916)

## 2023-11-05 LAB — ESTRADIOL: Estradiol: 16.1 pg/mL (ref 7.6–42.6)

## 2023-11-09 ENCOUNTER — Ambulatory Visit: Payer: Self-pay | Admitting: Urology

## 2023-11-15 ENCOUNTER — Ambulatory Visit (INDEPENDENT_AMBULATORY_CARE_PROVIDER_SITE_OTHER): Admitting: Family Medicine

## 2023-11-15 ENCOUNTER — Telehealth: Payer: Self-pay | Admitting: Family Medicine

## 2023-11-15 ENCOUNTER — Other Ambulatory Visit: Payer: Self-pay

## 2023-11-15 ENCOUNTER — Encounter: Payer: Self-pay | Admitting: Family Medicine

## 2023-11-15 ENCOUNTER — Telehealth: Payer: Self-pay | Admitting: Pharmacy Technician

## 2023-11-15 ENCOUNTER — Encounter: Payer: Self-pay | Admitting: Hematology

## 2023-11-15 ENCOUNTER — Other Ambulatory Visit (HOSPITAL_COMMUNITY): Payer: Self-pay

## 2023-11-15 VITALS — BP 110/72 | HR 88 | Resp 18 | Ht 71.0 in | Wt 234.0 lb

## 2023-11-15 DIAGNOSIS — E7849 Other hyperlipidemia: Secondary | ICD-10-CM

## 2023-11-15 DIAGNOSIS — E559 Vitamin D deficiency, unspecified: Secondary | ICD-10-CM | POA: Diagnosis not present

## 2023-11-15 DIAGNOSIS — E119 Type 2 diabetes mellitus without complications: Secondary | ICD-10-CM | POA: Diagnosis not present

## 2023-11-15 DIAGNOSIS — H6523 Chronic serous otitis media, bilateral: Secondary | ICD-10-CM | POA: Diagnosis not present

## 2023-11-15 DIAGNOSIS — E038 Other specified hypothyroidism: Secondary | ICD-10-CM

## 2023-11-15 DIAGNOSIS — G629 Polyneuropathy, unspecified: Secondary | ICD-10-CM | POA: Diagnosis not present

## 2023-11-15 DIAGNOSIS — R7301 Impaired fasting glucose: Secondary | ICD-10-CM

## 2023-11-15 DIAGNOSIS — Z125 Encounter for screening for malignant neoplasm of prostate: Secondary | ICD-10-CM | POA: Insufficient documentation

## 2023-11-15 DIAGNOSIS — Z720 Tobacco use: Secondary | ICD-10-CM

## 2023-11-15 DIAGNOSIS — Z122 Encounter for screening for malignant neoplasm of respiratory organs: Secondary | ICD-10-CM

## 2023-11-15 DIAGNOSIS — Z794 Long term (current) use of insulin: Secondary | ICD-10-CM

## 2023-11-15 MED ORDER — GABAPENTIN 300 MG PO CAPS
600.0000 mg | ORAL_CAPSULE | Freq: Three times a day (TID) | ORAL | 3 refills | Status: DC
Start: 2023-11-15 — End: 2024-03-20

## 2023-11-15 MED ORDER — NICOTINE 21 MG/24HR TD PT24: 21.0000 mg | MEDICATED_PATCH | Freq: Every day | TRANSDERMAL | 0 refills | Status: AC

## 2023-11-15 MED ORDER — OFLOXACIN 0.3 % OT SOLN: OTIC | 0 refills | Status: DC

## 2023-11-15 MED ORDER — OZEMPIC (0.25 OR 0.5 MG/DOSE) 2 MG/3ML ~~LOC~~ SOPN: 0.2500 mg | PEN_INJECTOR | SUBCUTANEOUS | 0 refills | Status: DC

## 2023-11-15 MED ORDER — FREESTYLE LIBRE 3 SENSOR MISC: 6 refills | Status: DC

## 2023-11-15 NOTE — Telephone Encounter (Signed)
 Pharmacy Patient Advocate Encounter   Received notification from CoverMyMeds that prior authorization for Ozempic  (0.25 or 0.5 MG/DOSE) 2MG /3ML pen-injectors is required/requested.   Insurance verification completed.   The patient is insured through [facility] MEDICAID .   Per test claim: PA required; PA submitted to above mentioned insurance via CoverMyMeds Key/confirmation #/EOC [id number] Status is pending

## 2023-11-15 NOTE — Patient Instructions (Addendum)
 I appreciate the opportunity to provide care to you today!    Follow up:  4 months  Labs: please stop by the lab today to get your blood drawn (CBC, CMP, TSH, Lipid profile, HgA1c, Vit D)  Screening: PSA for prostate cancer screening  Otitis media -Start Ofloxacin  0.3% otic solution - instill 5 drops into each ear once daily for 7 days. -A referral to ENT has been placed for further evaluation and management. -avoid inserting any objects into the ears and to keep the ears dry during treatment. -Follow up if symptoms persist, worsen, or new symptoms develop (e.g., fever, hearing loss, increased drainage).   Type II Diabetes Mellitus -In addition to your current treatment regimen, please start taking Ozempic  (semaglutide ) 0.25 mg subcutaneously once weekly. -Request medication refills monthly to ensure continuity of care. -Monitor for common side effects such as nausea, vomiting, decreased appetite, abdominal discomfort, and constipation. -Report any signs of more serious side effects, such as persistent abdominal pain, vision changes, or symptoms of hypoglycemia (shakiness, sweating, confusion). -Continue regular blood glucose monitoring and adhere to dietary and exercise recommendations.  Referrals today- ENT, pulmonary for lung cancer screening  Please continue to a heart-healthy diet and increase your physical activities. Try to exercise for at least five days a week.    It was a pleasure to see you and I look forward to continuing to work together on your health and well-being. Please do not hesitate to call the office if you need care or have questions about your care.  In case of emergency, please visit the Emergency Department for urgent care, or contact our clinic at [phone] to schedule an appointment. We're here to help you!   Have a wonderful day and week. With Gratitude, Lillymae Duet MSN, FNP-BC

## 2023-11-15 NOTE — Telephone Encounter (Signed)
 Copied from CRM [id number]. Topic: Clinical - Prescription Issue >> Nov 15, 2023 10:20 AM Raymond Malone wrote: Reason for CRM:  [facility] is calling to see if they can fill 2 bottles of ofloxacin  (FLOXIN ) 0.3 % OTIC solution. 1 bottle will not last for the 7 days he needs the medication. Please call at [phone] to approve extra bottle. Thanks

## 2023-11-15 NOTE — Progress Notes (Signed)
 Established Patient Office Visit  Subjective:  Patient ID: Raymond Malone, male    DOB: 06/20/1975  Age: 48 y.o. MRN: [id number]  CC:  Chief Complaint  Patient presents with   Medical Management of Chronic Issues    5 month follow up    Recurrent Otitis    Pt states he has been dealing with ongoing ear inf. Has been to UC and ED. Has finished course of abx but no relief.     HPI Raymond Malone is a 48 y.o. male with past medical history of  essential hypertension, Type 2 diabetes, Mixed hyperlipidemia presents for f/u of  chronic medical conditions.  Chronic otitis media: The patient presents with bilateral chronic otitis media. Symptoms initially began in the right ear and recently progressed to the left ear. He reports persistent yellowish drainage from both ears, associated ear pain, a clogged sensation upon waking, and frequent popping sounds. He was previously treated with Augmentin  on 10/20/2023 but states he has not fully recovered. The patient has a history of tympanostomy (ear) tubes in both ears due to recurrent ear infections.   Prostate cancer screening: The patient presents today requesting screening for prostate cancer, citing a family history--his uncle was diagnosed with prostate cancer. He is currently asymptomatic, with no urinary complaints or other genitourinary symptoms.  Tobacco use: The patient has a significant history of tobacco use. He began smoking approximately 17 years ago, previously averaging one pack per day. He quit cigarette smoking 2-3 years ago and currently uses a vape. He expresses a desire to quit vaping and requests assistance in the form of a prescription for nicotine  patches.     Past Medical History:  Diagnosis Date   Abscess, axilla 10/06/2016   Alcohol dependence (HCC) 10/06/2016   Current smoker 10/06/2016   Dyshidrotic eczema 10/27/2016   GERD (gastroesophageal reflux disease)    Hyperlipidemia    Hyperosmolar non-ketotic state in patient  with type 2 diabetes mellitus (HCC) 10/06/2016   Hypertension    MRSA (methicillin resistant Staphylococcus aureus)    Skin abscess    Type 2 diabetes mellitus (HCC)     Past Surgical History:  Procedure Laterality Date   ANKLE SURGERY Left    BIOPSY  01/06/2022   Procedure: BIOPSY;  Surgeon: Eartha Angelia Sieving, MD;  [facility];  Service: Gastroenterology;;   BIOPSY  07/16/2022   Procedure: BIOPSY;  Surgeon: Eartha Angelia Sieving, MD;  [facility];  Service: Gastroenterology;;   BIOPSY  01/07/2023   Procedure: BIOPSY;  Surgeon: Eartha Angelia Sieving, MD;  [facility];  Service: Gastroenterology;;   COLONOSCOPY N/A 08/18/2023   Procedure: COLONOSCOPY;  Surgeon: Eartha Angelia Sieving, MD;  [facility];  Service: Gastroenterology;  Laterality: N/A;  10:45AM;ASA 1   COLONOSCOPY WITH PROPOFOL  N/A 01/06/2022   Procedure: COLONOSCOPY WITH PROPOFOL ;  Surgeon: Eartha Angelia Sieving, MD;  [facility];  Service: Gastroenterology;  Laterality: N/A;  730   FLEXIBLE SIGMOIDOSCOPY N/A 07/16/2022   Procedure: FLEXIBLE SIGMOIDOSCOPY;  Surgeon: Eartha Angelia Sieving, MD;  [facility];  Service: Gastroenterology;  Laterality: N/A;  215pm, asa 1-2   FLEXIBLE SIGMOIDOSCOPY N/A 01/07/2023   Procedure: FLEXIBLE SIGMOIDOSCOPY;  Surgeon: Eartha Angelia Sieving, MD;  [facility];  Service: Gastroenterology;  Laterality: N/A;  10:15AM;ASA 1   HAND SURGERY     boxer's fracture L hand   INCISION AND DRAINAGE PERIRECTAL ABSCESS     IR IMAGING  GUIDED PORT INSERTION  02/10/2022   MYRINGOTOMY WITH TUBE PLACEMENT Bilateral 07/22/2020   Procedure: BILATERAL MYRINGOTOMY WITH TUBE PLACEMENT;  Surgeon: Karis Clunes, MD;  Location: [facility];  Service: ENT;  Laterality: Bilateral;   POLYPECTOMY  01/06/2022   Procedure: POLYPECTOMY INTESTINAL;  Surgeon: Eartha Angelia Sieving, MD;  [facility];  Service: Gastroenterology;;   POLYPECTOMY  08/18/2023   Procedure: POLYPECTOMY, INTESTINE;  Surgeon: Eartha Angelia Sieving, MD;  [facility];  Service: Gastroenterology;;   SVT ABLATION N/A 04/16/2021   Procedure: SVT ABLATION;  Surgeon: Waddell Danelle ORN, MD;  Location: [facility];  Service: Cardiovascular;  Laterality: N/A;    Family History  Problem Relation Age of Onset   Hypertension Mother     Social History   Socioeconomic History   Marital status: Single    Spouse name: Shameka   Number of children: 7   Years of education: 14   Highest education level: Not on file  Occupational History   Occupation: factory work, sorting  Tobacco Use   Smoking status: Former    Current packs/day: 1.00    Average packs/day: 1 pack/day for 29.5 years (29.5 ttl pk-yrs)    Types: Cigarettes    Start date: 05/11/1994   Smokeless tobacco: Never  Vaping Use   Vaping status: Every Day   Substances: Nicotine , Flavoring  Substance and Sexual Activity   Alcohol use: Not Currently   Drug use: Not Currently    Types: Marijuana   Sexual activity: Yes    Birth control/protection: Condom  Other Topics Concern   Not on file  Social History Narrative   Lives with fiancee, mother, and 7 children, 3 are biological his, 2 are hers and 1 nephew   No pets in the home      Enjoys: spending time with the kids      Diet: Eats all food groups, does not have a true focus for diabetes   Caffeine: Drinks about 1 or 2 sodas a week overall has reduced his caffeine intake sometimes some tea   Water : Reports taking 6-8 times a day for more      Wears a seatbelt, does not use his phone or driving   Smoke detectors at home   Does not have fire extinguisher at this time   No weapons in the home   Social Drivers of Health   Financial Resource Strain: Low Risk  (02/09/2020)   Overall Financial Resource Strain (CARDIA)    Difficulty of Paying Living Expenses: Not hard at all  Food  Insecurity: No Food Insecurity (02/09/2020)   Hunger Vital Sign    Worried About Running Out of Food in the Last Year: Never true    Ran Out of Food in the Last Year: Never true  Transportation Needs: No Transportation Needs (02/09/2020)   PRAPARE - Administrator, Civil Service (Medical): No    Lack of Transportation (Non-Medical): No  Physical Activity: Inactive (02/09/2020)   Exercise Vital Sign    Days of Exercise per Week: 0 days    Minutes of Exercise per Session: 0 min  Stress: Stress Concern Present (02/09/2020)   [employer] of Occupational Health - Occupational Stress Questionnaire    Feeling of Stress : To some extent  Social Connections: Moderately Isolated (02/09/2020)   Social Connection and Isolation Panel    Frequency of Communication with Friends and Family: More than three times a week  Frequency of Social Gatherings with Friends and Family: More than three times a week    Attends Religious Services: Never    Database administrator or Organizations: No    Attends Banker Meetings: Never    Marital Status: Living with partner  Intimate Partner Violence: Not At Risk (02/09/2020)   Humiliation, Afraid, Rape, and Kick questionnaire    Fear of Current or Ex-Partner: No    Emotionally Abused: No    Physically Abused: No    Sexually Abused: No    Outpatient Medications Prior to Visit  Medication Sig Dispense Refill   Accu-Chek Softclix Lancets lancets 1 each by Other route 2 (two) times daily. Use as instructed 100 each 2   alfuzosin  (UROXATRAL ) 10 MG 24 hr tablet Take 1 tablet (10 mg total) by mouth at bedtime. 30 tablet 11   blood glucose meter kit and supplies Dispense based on patient and insurance preference. Use up to four times daily as directed. (FOR ICD-10 E10.9, E11.9). 1 each 0   Blood Glucose Monitoring Suppl (ACCU-CHEK GUIDE ME) w/Device KIT Use to test BG bid. E11.65 1 kit 0   docusate sodium  (COLACE) 100 MG capsule Take 1  capsule (100 mg total) by mouth 2 (two) times daily. 60 capsule 0   glipiZIDE  (GLUCOTROL  XL) 5 MG 24 hr tablet Take 1 tablet (5 mg total) by mouth daily with breakfast. 30 tablet 3   glucose blood (ACCU-CHEK GUIDE) test strip USE 1 STRIP TO TEST GLUCOSE TWICE DAILY E11.65 100 each 1   hydrocortisone -pramoxine (PROCTOFOAM-HC) rectal foam Place 1 applicator rectally 2 (two) times daily. 10 g 3   ibuprofen  (ADVIL ) 800 MG tablet Take 1 tablet (800 mg total) by mouth 3 (three) times daily. Take with food 21 tablet 0   lactulose  (CHRONULAC ) 10 GM/15ML solution Take 15 mLs (10 g total) by mouth at bedtime. If Colace is not working or 3 days w/o BM Take 15mL at bedtime every night to assist with regular bowel movements. Titrate down if having multiple bowel movements. If a bowel movement has not occurred in 3-4 days or longer take 15mL every 3 hours until a bowel movement has occurred. 236 mL 1   lisinopril -hydrochlorothiazide  (ZESTORETIC ) 20-25 MG tablet Take 1 tablet by mouth daily. 90 tablet 1   metFORMIN  (GLUCOPHAGE ) 500 MG tablet TAKE 1 TABLET BY MOUTH TWICE DAILY WITH A MEAL 180 tablet 0   pantoprazole  (PROTONIX ) 40 MG tablet Take 1 tablet by mouth once daily 30 tablet 0   pyridOXINE (VITAMIN B6) 50 MG tablet Take 1 tablet (50 mg total) by mouth daily. 30 tablet 1   senna (SENOKOT) 8.6 MG TABS tablet Take 1 tablet (8.6 mg total) by mouth daily. 120 tablet 0   sildenafil  (VIAGRA ) 100 MG tablet Take 0.5 tablets (50 mg total) by mouth daily as needed for erectile dysfunction. Erectile dysfunction: recommended to take 1 hour prior to sexual activity; do not take >1 dose per day 20 tablet 1   simvastatin  (ZOCOR ) 40 MG tablet Take 1 tablet (40 mg total) by mouth every evening. 90 tablet 0   tadalafil  (CIALIS ) 20 MG tablet Take 1 tablet (20 mg total) by mouth as needed. 30 tablet 5   Testosterone  1.62 % GEL Place 2 Pump onto the skin daily. 75 g 3   Vitamin D , Ergocalciferol , (DRISDOL ) 1.25 MG (50000 UNIT)  CAPS capsule Take 1 capsule (50,000 Units total) by mouth every 7 (seven) days. 20 capsule 1  gabapentin  (NEURONTIN ) 300 MG capsule Take 2 capsules (600 mg total) by mouth 3 (three) times daily. 180 capsule 3   amoxicillin -clavulanate (AUGMENTIN ) 875-125 MG tablet Take 1 tablet by mouth every 12 (twelve) hours. (Patient not taking: Reported on 11/03/2023) 20 tablet 0   No facility-administered medications prior to visit.    No Known Allergies  ROS Review of Systems  Constitutional:  Negative for fatigue and fever.  HENT:  Positive for ear discharge and ear pain.   Eyes:  Negative for visual disturbance.  Respiratory:  Negative for chest tightness and shortness of breath.   Cardiovascular:  Negative for chest pain and palpitations.  Neurological:  Negative for dizziness and headaches.      Objective:    Physical Exam HENT:     Head: Normocephalic.     Right Ear: External ear normal. No decreased hearing noted. No drainage. A PE tube is present.     Left Ear: External ear normal. No decreased hearing noted. No drainage. No PE tube.     Nose: No congestion or rhinorrhea.     Mouth/Throat:     Mouth: Mucous membranes are moist.  Cardiovascular:     Rate and Rhythm: Regular rhythm.     Heart sounds: No murmur heard. Pulmonary:     Effort: No respiratory distress.     Breath sounds: Normal breath sounds.  Neurological:     Mental Status: He is alert.     BP 110/72   Pulse 88   Resp 18   Ht 5' 11 (1.803 m)   Wt 234 lb (106.1 kg)   SpO2 94%   BMI 32.64 kg/m  Wt Readings from Last 3 Encounters:  11/15/23 234 lb (106.1 kg)  11/03/23 232 lb 12.8 oz (105.6 kg)  10/20/23 225 lb (102.1 kg)    Lab Results  Component Value Date   TSH 1.380 06/23/2023   Lab Results  Component Value Date   WBC 4.9 11/03/2023   HGB 14.7 11/03/2023   HCT 44.4 11/03/2023   MCV 92 11/03/2023   PLT 299 11/03/2023   Lab Results  Component Value Date   NA 134 11/03/2023   K 4.6  11/03/2023   CO2 21 11/03/2023   GLUCOSE 173 (H) 11/03/2023   BUN 12 11/03/2023   CREATININE 0.84 11/03/2023   BILITOT 0.9 11/03/2023   ALKPHOS 87 11/03/2023   AST 23 11/03/2023   ALT 31 11/03/2023   PROT 6.7 11/03/2023   ALBUMIN 4.5 11/03/2023   CALCIUM  9.9 11/03/2023   ANIONGAP 10 08/30/2023   EGFR 108 11/03/2023   Lab Results  Component Value Date   CHOL 153 06/23/2023   Lab Results  Component Value Date   HDL 47 06/23/2023   Lab Results  Component Value Date   LDLCALC 92 06/23/2023   Lab Results  Component Value Date   TRIG 72 06/23/2023   Lab Results  Component Value Date   CHOLHDL 3.3 06/23/2023   Lab Results  Component Value Date   HGBA1C 7.0 (H) 06/23/2023      Assessment & Plan:  Bilateral chronic serous otitis media Assessment & Plan: Encouraged to start Ofloxacin  0.3% otic solution - instill 5 drops into each ear once daily for 7 days. A referral to ENT has been placed for further evaluation and management. Advised to avoid inserting any objects into the ears and to keep the ears dry during treatment. Encouraged to follow up if symptoms persist, worsen, or if new symptoms develop (  e.g., fever, hearing loss, or increased drainage).   Orders: -     Ambulatory referral to ENT  Type 2 diabetes mellitus without complication, with long-term current use of insulin  [facility]) Assessment & Plan: Advised the patient to begin Ozempic  (semaglutide ) 0.25 mg subcutaneously once weekly in addition to his current treatment regimen. He is encouraged to request medication refills monthly to ensure continuity of care. The patient was instructed to monitor for common side effects such as nausea, vomiting, decreased appetite, abdominal discomfort, and constipation. He should report any serious symptoms including persistent abdominal pain, vision changes, or signs of hypoglycemia (e.g., shakiness, sweating, confusion). The patient is advised to continue regular blood glucose  monitoring and adhere to dietary and exercise recommendations.   Orders: -     FreeStyle Libre 3 Sensor; Place 1 sensor on the skin every 14 days. Use to check glucose continuously  Dispense: 2 each; Refill: 6 -     Ozempic  (0.25 or 0.5 MG/DOSE); Inject 0.25 mg into the skin once a week.  Dispense: 3 mL; Refill: 0  Tobacco use Assessment & Plan: Tobacco Cessation: Will initiate therapy with Nicoderm CQ  21 mg patch daily. Instructed the patient to apply one patch to a clean, dry, hairless area of the upper body or upper outer arm each day and rotate sites to avoid skin irritation. Advised to avoid smoking or vaping while using the patch.   Orders: -     Nicotine ; Place 1 patch (21 mg total) onto the skin daily.  Dispense: 28 patch; Refill: 0  Neuropathy -     Gabapentin ; Take 2 capsules (600 mg total) by mouth 3 (three) times daily.  Dispense: 180 capsule; Refill: 3  Screening for lung cancer -     Ambulatory Referral for Lung Cancer Scre  Prostate cancer screening Assessment & Plan: Pending labs  Orders: -     PSA  IFG (impaired fasting glucose) -     Hemoglobin A1c  Vitamin D  deficiency -     VITAMIN D  25 Hydroxy (Vit-D Deficiency, Fractures)  TSH (thyroid -stimulating hormone deficiency) -     TSH + free T4  Other hyperlipidemia -     Lipid panel -     CMP14+EGFR -     CBC with Differential/Platelet  Note: This chart has been completed using Engineer, civil (consulting) software, and while attempts have been made to ensure accuracy, certain words and phrases may not be transcribed as intended.    Follow-up: Return in about 4 months (around 03/17/2024).   Leshae Mcclay, FNP

## 2023-11-15 NOTE — Assessment & Plan Note (Signed)
 Advised the patient to begin Ozempic  (semaglutide ) 0.25 mg subcutaneously once weekly in addition to his current treatment regimen. He is encouraged to request medication refills monthly to ensure continuity of care. The patient was instructed to monitor for common side effects such as nausea, vomiting, decreased appetite, abdominal discomfort, and constipation. He should report any serious symptoms including persistent abdominal pain, vision changes, or signs of hypoglycemia (e.g., shakiness, sweating, confusion). The patient is advised to continue regular blood glucose monitoring and adhere to dietary and exercise recommendations.

## 2023-11-15 NOTE — Assessment & Plan Note (Signed)
 Pending labs

## 2023-11-15 NOTE — Assessment & Plan Note (Signed)
 Tobacco Cessation: Will initiate therapy with Nicoderm CQ  21 mg patch daily. Instructed the patient to apply one patch to a clean, dry, hairless area of the upper body or upper outer arm each day and rotate sites to avoid skin irritation. Advised to avoid smoking or vaping while using the patch.

## 2023-11-15 NOTE — Telephone Encounter (Signed)
 Pharmacy Patient Advocate Encounter  Received notification from [facility] MEDICAID that Prior Authorization for Ozempic  (0.25 or 0.5 MG/DOSE) 2MG /3ML pen-injectors has been APPROVED from 11/15/2023 to 11/14/2024. Ran test claim, Copay is $4.00. This test claim was processed through [facility]- copay amounts may vary at other pharmacies due to pharmacy/plan contracts, or as the patient moves through the different stages of their insurance plan.   PA #/Case ID/Reference #:  [id number]

## 2023-11-15 NOTE — Telephone Encounter (Signed)
 Called [facility]- no answer. Re sent script in for 2 bottles with message that it was ok to dispense 10ml instead of 5ml

## 2023-11-15 NOTE — Assessment & Plan Note (Signed)
 Encouraged to start Ofloxacin  0.3% otic solution - instill 5 drops into each ear once daily for 7 days. A referral to ENT has been placed for further evaluation and management. Advised to avoid inserting any objects into the ears and to keep the ears dry during treatment. Encouraged to follow up if symptoms persist, worsen, or if new symptoms develop (e.g., fever, hearing loss, or increased drainage).

## 2023-11-16 ENCOUNTER — Encounter: Payer: Self-pay | Admitting: Hematology

## 2023-11-16 LAB — CBC WITH DIFFERENTIAL/PLATELET
Basophils Absolute: 0.1 x10E3/uL (ref 0.0–0.2)
Basos: 2 %
EOS (ABSOLUTE): 0.4 x10E3/uL (ref 0.0–0.4)
Eos: 9 %
Hematocrit: 43.3 % (ref 37.5–51.0)
Hemoglobin: 14.2 g/dL (ref 13.0–17.7)
Immature Grans (Abs): 0 x10E3/uL (ref 0.0–0.1)
Immature Granulocytes: 0 %
Lymphocytes Absolute: 0.7 x10E3/uL (ref 0.7–3.1)
Lymphs: 14 %
MCH: 30 pg (ref 26.6–33.0)
MCHC: 32.8 g/dL (ref 31.5–35.7)
MCV: 91 fL (ref 79–97)
Monocytes Absolute: 0.7 x10E3/uL (ref 0.1–0.9)
Monocytes: 15 %
Neutrophils Absolute: 2.7 x10E3/uL (ref 1.4–7.0)
Neutrophils: 60 %
Platelets: 309 x10E3/uL (ref 150–450)
RBC: 4.74 x10E6/uL (ref 4.14–5.80)
RDW: 11.9 % (ref 11.6–15.4)
WBC: 4.5 x10E3/uL (ref 3.4–10.8)

## 2023-11-16 LAB — TSH+FREE T4
Free T4: 1.41 ng/dL (ref 0.82–1.77)
TSH: 0.79 u[IU]/mL (ref 0.450–4.500)

## 2023-11-16 LAB — LIPID PANEL
Chol/HDL Ratio: 3.5 ratio (ref 0.0–5.0)
Cholesterol, Total: 156 mg/dL (ref 100–199)
HDL: 44 mg/dL (ref >39)
LDL Chol Calc (NIH): 100 mg/dL — ABNORMAL HIGH (ref 0–99)
Triglycerides: 60 mg/dL (ref 0–149)
VLDL Cholesterol Cal: 12 mg/dL (ref 5–40)

## 2023-11-16 LAB — CMP14+EGFR
ALT: 29 IU/L (ref 0–44)
AST: 19 IU/L (ref 0–40)
Albumin: 4.6 g/dL (ref 4.1–5.1)
Alkaline Phosphatase: 86 IU/L (ref 44–121)
BUN/Creatinine Ratio: 16 (ref 9–20)
BUN: 14 mg/dL (ref 6–24)
Bilirubin Total: 0.4 mg/dL (ref 0.0–1.2)
CO2: 21 mmol/L (ref 20–29)
Calcium: 9.8 mg/dL (ref 8.7–10.2)
Chloride: 97 mmol/L (ref 96–106)
Creatinine, Ser: 0.86 mg/dL (ref 0.76–1.27)
Globulin, Total: 2.1 g/dL (ref 1.5–4.5)
Glucose: 146 mg/dL — ABNORMAL HIGH (ref 70–99)
Potassium: 4.7 mmol/L (ref 3.5–5.2)
Sodium: 137 mmol/L (ref 134–144)
Total Protein: 6.7 g/dL (ref 6.0–8.5)
eGFR: 107 mL/min/1.73 (ref >59)

## 2023-11-16 LAB — HEMOGLOBIN A1C
Est. average glucose Bld gHb Est-mCnc: 189 mg/dL
Hgb A1c MFr Bld: 8.2 % — ABNORMAL HIGH (ref 4.8–5.6)

## 2023-11-16 LAB — VITAMIN D 25 HYDROXY (VIT D DEFICIENCY, FRACTURES): Vit D, 25-Hydroxy: 26.1 ng/mL — ABNORMAL LOW (ref 30.0–100.0)

## 2023-11-16 LAB — PSA: Prostate Specific Ag, Serum: 0.5 ng/mL (ref 0.0–4.0)

## 2023-11-17 ENCOUNTER — Telehealth: Payer: Self-pay | Admitting: Pharmacy Technician

## 2023-11-17 NOTE — Telephone Encounter (Signed)
 Pharmacy Patient Advocate Encounter   Received notification from [employer] that prior authorization for FreeStyle Libre 3 Sensor is required/requested.   Insurance verification completed.   The patient is insured through [facility] MEDICAID .   Patient must have a diagnosis of insulin -dependent diabetes in order to meet coverage criteria under [place] Medicaid. Chart does not reflect any current insulin  therapy.

## 2023-11-18 ENCOUNTER — Ambulatory Visit: Payer: Self-pay | Admitting: Family Medicine

## 2023-11-18 ENCOUNTER — Other Ambulatory Visit: Payer: Self-pay | Admitting: Family Medicine

## 2023-11-18 ENCOUNTER — Ambulatory Visit: Payer: 59 | Admitting: Family Medicine

## 2023-11-18 DIAGNOSIS — E559 Vitamin D deficiency, unspecified: Secondary | ICD-10-CM

## 2023-11-18 MED ORDER — VITAMIN D (ERGOCALCIFEROL) 1.25 MG (50000 UNIT) PO CAPS: 50000.0000 [IU] | ORAL_CAPSULE | ORAL | 1 refills | Status: DC

## 2023-11-18 NOTE — Progress Notes (Signed)
 Please inform the patient to continue his current treatment regimen as prescribed and to request monthly refills for his Ozempic . His HbA1c has increased to 8.2, indicating suboptimal blood sugar control. I recommend reducing his intake of high-sugar foods and beverages and increasing physical activity as tolerated. His insurance does not cover the [facility] because he is not on insulin  therapy. A prescription for a weekly vitamin D  supplement has been sent to his pharmacy due to a slightly low vitamin D  level. Please continue all other prescribed medications as directed

## 2023-11-29 ENCOUNTER — Ambulatory Visit: Admitting: Nurse Practitioner

## 2023-12-03 ENCOUNTER — Ambulatory Visit: Admitting: Student

## 2023-12-07 ENCOUNTER — Other Ambulatory Visit: Payer: Self-pay | Admitting: Oncology

## 2023-12-07 DIAGNOSIS — I10 Essential (primary) hypertension: Secondary | ICD-10-CM

## 2023-12-16 ENCOUNTER — Other Ambulatory Visit: Payer: Self-pay | Admitting: *Deleted

## 2023-12-16 DIAGNOSIS — I10 Essential (primary) hypertension: Secondary | ICD-10-CM

## 2023-12-16 MED ORDER — LISINOPRIL-HYDROCHLOROTHIAZIDE 20-25 MG PO TABS: 1.0000 | ORAL_TABLET | Freq: Every day | ORAL | 1 refills | Status: DC

## 2023-12-22 ENCOUNTER — Other Ambulatory Visit: Payer: Self-pay | Admitting: *Deleted

## 2023-12-22 ENCOUNTER — Encounter (INDEPENDENT_AMBULATORY_CARE_PROVIDER_SITE_OTHER): Payer: Self-pay | Admitting: *Deleted

## 2023-12-23 ENCOUNTER — Other Ambulatory Visit: Payer: Self-pay

## 2023-12-23 DIAGNOSIS — H5213 Myopia, bilateral: Secondary | ICD-10-CM | POA: Diagnosis not present

## 2023-12-24 ENCOUNTER — Encounter (INDEPENDENT_AMBULATORY_CARE_PROVIDER_SITE_OTHER): Payer: Self-pay

## 2023-12-28 ENCOUNTER — Other Ambulatory Visit: Payer: Self-pay | Admitting: Family Medicine

## 2023-12-28 ENCOUNTER — Encounter: Payer: Self-pay | Admitting: Nurse Practitioner

## 2023-12-28 ENCOUNTER — Ambulatory Visit: Payer: PRIVATE HEALTH INSURANCE | Attending: Nurse Practitioner | Admitting: Nurse Practitioner

## 2023-12-28 VITALS — BP 118/80 | HR 84 | Ht 71.0 in | Wt 248.8 lb

## 2023-12-28 DIAGNOSIS — I471 Supraventricular tachycardia, unspecified: Secondary | ICD-10-CM | POA: Diagnosis present

## 2023-12-28 DIAGNOSIS — K219 Gastro-esophageal reflux disease without esophagitis: Secondary | ICD-10-CM

## 2023-12-28 DIAGNOSIS — E785 Hyperlipidemia, unspecified: Secondary | ICD-10-CM | POA: Diagnosis present

## 2023-12-28 DIAGNOSIS — R002 Palpitations: Secondary | ICD-10-CM | POA: Diagnosis present

## 2023-12-28 DIAGNOSIS — I10 Essential (primary) hypertension: Secondary | ICD-10-CM

## 2023-12-28 DIAGNOSIS — E119 Type 2 diabetes mellitus without complications: Secondary | ICD-10-CM

## 2023-12-28 NOTE — Progress Notes (Signed)
  Cardiology Office Note   Date: 12/28/2023 ID:  Raymond Malone, DOB 09/08/1975, MRN [id number] PCP: Zarwolo, Gloria, FNP  [facility] Providers Cardiologist:  Jayson Sierras, MD     History of Present Illness Raymond Malone is a 48 y.o. male with a PMH of palpitations, SVT, HLD, T2DM, GERD, who presents today for evaluation about palpitaitons, sweating, and lightheadedness. Followed by EP.   Last seen by Dr. Sierras on August 29, 2020. Was noted to have hx of longstanding, intermittent palpitations and 7 day Zio monitor was arranged for further evaluation - results are not available.   He presents today for further follow-up regarding palpations, sweating, and lightheadedness. He states this was a one time occurrence, denies any recurrence since. Appears he was dehydrated surrounding episode. Denies any chest pain, shortness of breath, palpitations, syncope, presyncope, dizziness, orthopnea, PND, swelling or significant weight changes, acute bleeding, or claudication.  ROS: Negative. See HPI.   Studies Reviewed  EKG: EKG Interpretation Date/Time:  Tuesday December 28 2023 16:17:28 EDT Ventricular Rate:  87 PR Interval:  154 QRS Duration:  76 QT Interval:  340 QTC Calculation: 409 R Axis:   48  Text Interpretation: Normal sinus rhythm Cannot rule out Anterior infarct , age undetermined When compared with ECG of 18-Aug-2023 11:17, Premature atrial complexes are no longer Present Vent. rate has increased BY  34 BPM Confirmed by Miriam Norris [phone]) on 12/28/2023 4:19:36 PM   Physical Exam VS:  BP 118/80 (BP Location: Right Arm, Cuff Size: Normal)   Pulse 84   Ht 5' 11 (1.803 m)   Wt 248 lb 12.8 oz (112.9 kg)   SpO2 96%   BMI 34.70 kg/m        Wt Readings from Last 3 Encounters:  12/28/23 248 lb 12.8 oz (112.9 kg)  11/15/23 234 lb (106.1 kg)  11/03/23 232 lb 12.8 oz (105.6 kg)    GEN: Well nourished, well developed in no acute distress NECK: No JVD; No carotid  bruits CARDIAC: S1/S2, RRR, no murmurs, rubs, gallops RESPIRATORY:  Clear to auscultation without rales, wheezing or rhonchi  ABDOMEN: Soft, non-tender, non-distended EXTREMITIES:  No edema; No deformity   ASSESSMENT AND PLAN  Palpitations Denies any recurrent palpitations or tachycardia since episode. Appears it was in setting of dehydration. No more recurrences since.  No medication changes at this time. Most recent labs WNL. Heart healthy diet and regular cardiovascular exercise encouraged.   SVT EKG confirms NSR. HR is well controlled. No recurrences to palpitations since episode. Heart healthy diet and regular cardiovascular exercise encouraged.  No medication changes at this time.  Most recent labs unremarkable.   HLD   Most recent LDL 11/2023.  Being managed by PCP.  Continue simvastatin . Heart healthy diet and regular cardiovascular exercise encouraged.   I spent a total duration of 20 minutes reviewing prior notes, reviewing outside records including  labs, EKG today, face-to-face counseling of medical condition, pathophysiology, evaluation, management, and documenting the findings in the note.    Dispo: Follow-up with MD/APP in 1 year or sooner if anything changes.  Signed, Norris Miriam, NP

## 2023-12-28 NOTE — Patient Instructions (Signed)
 Medication Instructions:  Your physician recommends that you continue on your current medications as directed. Please refer to the Current Medication list given to you today.  *If you need a refill on your cardiac medications before your next appointment, please call your pharmacy*  Lab Work: NONE   If you have labs (blood work) drawn today and your tests are completely normal, you will receive your results only by: MyChart Message (if you have MyChart) OR A paper copy in the mail If you have any lab test that is abnormal or we need to change your treatment, we will call you to review the results.  Testing/Procedures: NONE   Follow-Up: At [facility], you and your health needs are our priority.  As part of our continuing mission to provide you with exceptional heart care, our providers are all part of one team.  This team includes your primary Cardiologist (physician) and Advanced Practice Providers or APPs (Physician Assistants and Nurse Practitioners) who all work together to provide you with the care you need, when you need it.  Your next appointment:   1 year(s)  Provider:   You may see Teddie Favre, MD or the following Advanced Practice Provider on your designated Care Team:   Lasalle Pointer, NP    We recommend signing up for the patient portal called "MyChart".  Sign up information is provided on this After Visit Summary.  MyChart is used to connect with patients for Virtual Visits (Telemedicine).  Patients are able to view lab/test results, encounter notes, upcoming appointments, etc.  Non-urgent messages can be sent to your provider as well.   To learn more about what you can do with MyChart, go to [email or web address].   Other Instructions Thank you for choosing [facility]!

## 2024-01-01 ENCOUNTER — Other Ambulatory Visit: Payer: Self-pay | Admitting: Family Medicine

## 2024-01-01 DIAGNOSIS — E119 Type 2 diabetes mellitus without complications: Secondary | ICD-10-CM

## 2024-01-03 ENCOUNTER — Other Ambulatory Visit: Payer: Self-pay | Admitting: Family Medicine

## 2024-01-03 DIAGNOSIS — E119 Type 2 diabetes mellitus without complications: Secondary | ICD-10-CM

## 2024-01-03 MED ORDER — OZEMPIC (0.25 OR 0.5 MG/DOSE) 2 MG/3ML ~~LOC~~ SOPN: 0.5000 mg | PEN_INJECTOR | SUBCUTANEOUS | 0 refills | Status: DC

## 2024-01-07 ENCOUNTER — Telehealth: Payer: Self-pay

## 2024-01-07 NOTE — Telephone Encounter (Signed)
 Tried to return pt's call, left a message requesting pt return call to the office.

## 2024-01-08 ENCOUNTER — Other Ambulatory Visit: Payer: Self-pay | Admitting: Family Medicine

## 2024-01-08 DIAGNOSIS — K219 Gastro-esophageal reflux disease without esophagitis: Secondary | ICD-10-CM

## 2024-01-11 ENCOUNTER — Other Ambulatory Visit: Payer: Self-pay

## 2024-01-11 DIAGNOSIS — E1165 Type 2 diabetes mellitus with hyperglycemia: Secondary | ICD-10-CM

## 2024-01-11 MED ORDER — GLIPIZIDE ER 5 MG PO TB24: 5.0000 mg | ORAL_TABLET | Freq: Every day | ORAL | 2 refills | Status: DC

## 2024-01-11 MED ORDER — OZEMPIC (1 MG/DOSE) 4 MG/3ML ~~LOC~~ SOPN: 1.0000 mg | PEN_INJECTOR | SUBCUTANEOUS | 2 refills | Status: DC

## 2024-01-11 NOTE — Telephone Encounter (Signed)
 Pt called stating his BG has been over 200 for the past 2 weeks. Pt brought his glucometer to the office. Pt taking Glipizide  5mg  daily, Metformin  500mg  bid and Ozempic  0.5mg  weekly. Dr.Nida evaluated BG logs. Advised pt to increase Glipizide  to 5mg  BID, continue Metformin  500mg  BID and increase Ozempic  to 1mg  weekly per Dr.Nida's orders. Pt voiced understanding. Rx's for Glipizide  5mg  BID and Ozempic  1mg  weekly sent to [facility] in [place].

## 2024-01-12 ENCOUNTER — Encounter (HOSPITAL_COMMUNITY): Payer: Self-pay | Admitting: Emergency Medicine

## 2024-01-12 ENCOUNTER — Emergency Department (HOSPITAL_COMMUNITY)
Admission: EM | Admit: 2024-01-12 | Discharge: 2024-01-12 | Disposition: A | Discharge status: Home / Self Care | Payer: PRIVATE HEALTH INSURANCE | Attending: Emergency Medicine | Admitting: Emergency Medicine

## 2024-01-12 ENCOUNTER — Other Ambulatory Visit: Payer: Self-pay

## 2024-01-12 DIAGNOSIS — B353 Tinea pedis: Secondary | ICD-10-CM | POA: Insufficient documentation

## 2024-01-12 DIAGNOSIS — D72829 Elevated white blood cell count, unspecified: Secondary | ICD-10-CM | POA: Diagnosis not present

## 2024-01-12 DIAGNOSIS — Z7984 Long term (current) use of oral hypoglycemic drugs: Secondary | ICD-10-CM | POA: Insufficient documentation

## 2024-01-12 DIAGNOSIS — E1165 Type 2 diabetes mellitus with hyperglycemia: Secondary | ICD-10-CM | POA: Diagnosis not present

## 2024-01-12 DIAGNOSIS — Z85048 Personal history of other malignant neoplasm of rectum, rectosigmoid junction, and anus: Secondary | ICD-10-CM | POA: Insufficient documentation

## 2024-01-12 DIAGNOSIS — Z79899 Other long term (current) drug therapy: Secondary | ICD-10-CM | POA: Diagnosis not present

## 2024-01-12 DIAGNOSIS — I10 Essential (primary) hypertension: Secondary | ICD-10-CM | POA: Diagnosis not present

## 2024-01-12 DIAGNOSIS — M79672 Pain in left foot: Secondary | ICD-10-CM | POA: Diagnosis present

## 2024-01-12 LAB — CBC WITH DIFFERENTIAL/PLATELET
Abs Immature Granulocytes: 0.03 K/uL (ref 0.00–0.07)
Basophils Absolute: 0.1 K/uL (ref 0.0–0.1)
Basophils Relative: 1 %
Eosinophils Absolute: 0.3 K/uL (ref 0.0–0.5)
Eosinophils Relative: 6 %
HCT: 40.7 % (ref 39.0–52.0)
Hemoglobin: 14.4 g/dL (ref 13.0–17.0)
Immature Granulocytes: 1 %
Lymphocytes Relative: 13 %
Lymphs Abs: 0.7 K/uL (ref 0.7–4.0)
MCH: 29.9 pg (ref 26.0–34.0)
MCHC: 35.4 g/dL (ref 30.0–36.0)
MCV: 84.6 fL (ref 80.0–100.0)
Monocytes Absolute: 0.8 K/uL (ref 0.1–1.0)
Monocytes Relative: 15 %
Neutro Abs: 3.7 K/uL (ref 1.7–7.7)
Neutrophils Relative %: 64 %
Platelets: 291 K/uL (ref 150–400)
RBC: 4.81 MIL/uL (ref 4.22–5.81)
RDW: 11.6 % (ref 11.5–15.5)
WBC: 5.6 K/uL (ref 4.0–10.5)
nRBC: 0 % (ref 0.0–0.2)

## 2024-01-12 LAB — CBG MONITORING, ED: Glucose-Capillary: 309 mg/dL — ABNORMAL HIGH (ref 70–99)

## 2024-01-12 MED ORDER — CEPHALEXIN 500 MG PO CAPS: 500.0000 mg | ORAL_CAPSULE | Freq: Three times a day (TID) | ORAL | 0 refills | Status: DC

## 2024-01-12 MED ORDER — TERBINAFINE HCL 1 % EX CREA: 1.0000 | TOPICAL_CREAM | Freq: Two times a day (BID) | CUTANEOUS | 0 refills | Status: AC

## 2024-01-12 NOTE — ED Provider Notes (Signed)
 [place] EMERGENCY DEPARTMENT AT [facility] Provider Note   CSN: [id number] Arrival date & time: 01/12/24  1430     Patient presents with: Foot Pain   Raymond Malone is a 48 y.o. male.    Foot Pain       Raymond Malone is a 48 y.o. male with past medical history of type 2 diabetes, hypertension, SVT, rectal cancer, who presents to the Emergency Department complaining of left foot pain x 2 weeks.  Describes pain along his 4th and 5th toes.  Pain worsens with movement of his toes and with weightbearing.  He describes having a aching pain of his foot at night.  He states his blood sugars have been elevated recently, but he was just seen by his endocrinologist and his diabetes medications have been adjusted.  He denies any redness of his foot, known injury, open wound numbness or tingling.  He is concerned about a diabetic foot wound.   Prior to Admission medications   Medication Sig Start Date End Date Taking? Authorizing Provider  cephALEXin  (KEFLEX ) 500 MG capsule Take 1 capsule (500 mg total) by mouth 3 (three) times daily. 01/12/24  Yes Lavana Huckeba, PA-C  terbinafine  (LAMISIL ) 1 % cream Apply 1 Application topically 2 (two) times daily. Apply to clean dry skin between your toes 01/12/24  Yes Maela Takeda, PA-C  Accu-Chek Softclix Lancets lancets 1 each by Other route 2 (two) times daily. Use as instructed 12/09/21   Nida, Gebreselassie W, MD  alfuzosin  (UROXATRAL ) 10 MG 24 hr tablet Take 1 tablet (10 mg total) by mouth at bedtime. 07/30/23   McKenzie, Belvie CROME, MD  blood glucose meter kit and supplies Dispense based on patient and insurance preference. Use up to four times daily as directed. (FOR ICD-10 E10.9, E11.9). 02/10/21   Elnor Fairy HERO, NP  Blood Glucose Monitoring Suppl (ACCU-CHEK GUIDE ME) w/Device KIT Use to test BG bid. E11.65 12/22/22   Lenis Ethelle ORN, MD  Continuous Glucose Sensor (FREESTYLE LIBRE 3 SENSOR) MISC Place 1 sensor on the skin every 14  days. Use to check glucose continuously Patient not taking: Reported on 12/28/2023 11/15/23   Zarwolo, Gloria, FNP  docusate sodium  (COLACE) 100 MG capsule Take 1 capsule (100 mg total) by mouth 2 (two) times daily. Patient not taking: Reported on 12/28/2023 06/04/23   Davonna Siad, MD  gabapentin  (NEURONTIN ) 300 MG capsule Take 2 capsules (600 mg total) by mouth 3 (three) times daily. 11/15/23   Zarwolo, Gloria, FNP  glipiZIDE  (GLUCOTROL  XL) 5 MG 24 hr tablet Take 1 tablet (5 mg total) by mouth daily with breakfast. 01/11/24   Nida, Gebreselassie W, MD  glucose blood (ACCU-CHEK GUIDE) test strip USE 1 STRIP TO TEST GLUCOSE TWICE DAILY E11.65 12/22/22   Nida, Gebreselassie W, MD  hydrocortisone -pramoxine (PROCTOFOAM-HC) rectal foam Place 1 applicator rectally 2 (two) times daily. Patient not taking: Reported on 12/28/2023 10/22/22   Rogers Hai, MD  ibuprofen  (ADVIL ) 800 MG tablet Take 1 tablet (800 mg total) by mouth 3 (three) times daily. Take with food 10/20/23   Allah Reason, PA-C  lactulose  (CHRONULAC ) 10 GM/15ML solution Take 15 mLs (10 g total) by mouth at bedtime. If Colace is not working or 3 days w/o BM Take 15mL at bedtime every night to assist with regular bowel movements. Titrate down if having multiple bowel movements. If a bowel movement has not occurred in 3-4 days or longer take 15mL every 3 hours until a bowel movement has  occurred. Patient not taking: Reported on 12/28/2023 06/09/23   Katragadda, Sreedhar, MD  lisinopril -hydrochlorothiazide  (ZESTORETIC ) 20-25 MG tablet Take 1 tablet by mouth daily. 12/16/23   Geofm Delon BRAVO, NP  metFORMIN  (GLUCOPHAGE ) 500 MG tablet TAKE 1 TABLET BY MOUTH TWICE DAILY WITH A MEAL 09/27/23   Nida, Gebreselassie W, MD  nicotine  (NICODERM CQ  - DOSED IN MG/24 HOURS) 21 mg/24hr patch Place 1 patch (21 mg total) onto the skin daily. 11/15/23   Zarwolo, Gloria, FNP  ofloxacin  (FLOXIN ) 0.3 % OTIC solution Place 5 drops into the both ear twice daily for 7 days  11/15/23   Zarwolo, Gloria, FNP  pantoprazole  (PROTONIX ) 40 MG tablet Take 1 tablet by mouth once daily 01/11/24   Zarwolo, Gloria, FNP  pyridOXINE (VITAMIN B6) 50 MG tablet Take 1 tablet (50 mg total) by mouth daily. Patient not taking: Reported on 12/28/2023 07/26/23   Zarwolo, Gloria, FNP  Semaglutide , 1 MG/DOSE, (OZEMPIC , 1 MG/DOSE,) 4 MG/3ML SOPN Inject 1 mg into the skin once a week. 01/11/24   Nida, Gebreselassie W, MD  senna (SENOKOT) 8.6 MG TABS tablet Take 1 tablet (8.6 mg total) by mouth daily. 06/04/23   Kandala, Hyndavi, MD  sildenafil  (VIAGRA ) 100 MG tablet Take 0.5 tablets (50 mg total) by mouth daily as needed for erectile dysfunction. Erectile dysfunction: recommended to take 1 hour prior to sexual activity; do not take >1 dose per day Patient not taking: Reported on 12/28/2023 02/15/23   Zarwolo, Gloria, FNP  simvastatin  (ZOCOR ) 40 MG tablet TAKE 1 TABLET BY MOUTH ONCE DAILY IN THE EVENING 12/28/23   Zarwolo, Gloria, FNP  tadalafil  (CIALIS ) 20 MG tablet Take 1 tablet (20 mg total) by mouth as needed. Patient not taking: Reported on 12/28/2023 05/24/23   Sherrilee Belvie CROME, MD  Testosterone  1.62 % GEL Place 2 Pump onto the skin daily. Patient not taking: Reported on 12/28/2023 07/14/23   Sherrilee Belvie CROME, MD  Vitamin D , Ergocalciferol , (DRISDOL ) 1.25 MG (50000 UNIT) CAPS capsule Take 1 capsule (50,000 Units total) by mouth every 7 (seven) days. Patient not taking: Reported on 12/28/2023 11/18/23   Zarwolo, Gloria, FNP    Allergies: Patient has no known allergies.    Review of Systems  Constitutional:  Negative for chills and fever.  Musculoskeletal:  Positive for arthralgias (pain 4th and 5th toes left foot). Negative for joint swelling.  Skin:  Positive for wound. Negative for color change.  Neurological:  Negative for weakness and numbness.    Updated Vital Signs BP (!) 148/90   Pulse 95   Temp (!) 97.5 F (36.4 C) (Oral)   Resp 18   Ht 5' 11 (1.803 m)   Wt 112.5 kg   SpO2 100%    BMI 34.59 kg/m   Physical Exam Vitals and nursing note reviewed.  Constitutional:      Appearance: Normal appearance. He is not ill-appearing.  Cardiovascular:     Rate and Rhythm: Normal rate and regular rhythm.     Pulses: Normal pulses.  Pulmonary:     Effort: Pulmonary effort is normal.  Musculoskeletal:        General: Tenderness present.     Comments: Serous weeping and whitish drainage of the webspaces to the 4th and 5th toes.  No edema, open wound or erythema of the foot.  No lymphangitis or excessive warmth.  Skin:    General: Skin is warm.     Capillary Refill: Capillary refill takes less than 2 seconds.     Findings:  No erythema.  Neurological:     General: No focal deficit present.     Mental Status: He is alert.     Sensory: No sensory deficit.     Motor: No weakness.     (all labs ordered are listed, but only abnormal results are displayed) Labs Reviewed  CBC WITH DIFFERENTIAL/PLATELET  BASIC METABOLIC PANEL WITH GFR  CBG MONITORING, ED    EKG: None  Radiology: No results found.   Procedures   Medications Ordered in the ED - No data to display                                  Medical Decision Making Patient here with complaint of pain of his left foot between his 4th and 5th toes.  Patient is a diabetic and concerned about a diabetic wound.  Denies any numbness swelling or redness of his foot.  He is a [employer] but denies any known injury.  States his blood sugars have been elevated recently, he is aware and states that he saw his endocrinologist this week and his diabetic medications were adjusted.  Exam of the left foot appears consistent with tinea pedis, No open wound, bleeding edema or erythema.  There is no lymphangitis.  Cellulitis, osteomyelitis also considered but felt less likely given reassuring exam findings.  No discoloration of the toes to suggest necrosis.  Amount and/or Complexity of Data Reviewed Labs: ordered.    Details:  CBC without evidence of leukocytosis, CBG 309.  Chemistries were also ordered, blood was collected but results delayed.  I called lab 2 times requesting results and told on the second call that the sample was missing. Discussion of management or test interpretation with external provider(s): Patient admits to elevated blood sugars recently.  He was seen by his endocrinologist yesterday and his antidiabetic medications were adjusted.  No clinical findings suggestive of DKA or HHS.  He is very well-appearing  Patient upset due to prolonged wait time for lab results.  He is requesting to be discharged, I think this is reasonable as I have low clinical suspicion for diabetic emergency.  His exam findings are consistent with tinea pedis.  There is no open wound of the foot or toes at this time and the extremity is warm   He will follow-up closely outpatient with PCP and he was given return precautions.  Risk OTC drugs. Prescription drug management.        Final diagnoses:  Tinea pedis of left foot    ED Discharge Orders          Ordered    terbinafine  (LAMISIL ) 1 % cream  2 times daily        01/12/24 1753    cephALEXin  (KEFLEX ) 500 MG capsule  3 times daily        01/12/24 1753               Herlinda Milling, PA-C 01/13/24 1548    Cottie Donnice PARAS, MD 01/16/24 [phone]

## 2024-01-12 NOTE — Discharge Instructions (Addendum)
 Clean the area between your toes with antibacterial soap and water , dry between your toes really well you may use a hair dryer on a cool setting to dry the skin between your toes then apply the cream as directed try to keep your feet as dry as possible.  Follow-up with your primary care provider for recheck, return to the emergency department for any new or worsening symptoms

## 2024-01-12 NOTE — ED Triage Notes (Signed)
 Pt c/o of left foot pain x 2 weeks. Denies any injury but states  a wound is coming, I know it   Pt states pain worsens at night. Just had his diabetic meds increased

## 2024-01-21 ENCOUNTER — Other Ambulatory Visit: Payer: Self-pay

## 2024-01-21 DIAGNOSIS — E1165 Type 2 diabetes mellitus with hyperglycemia: Secondary | ICD-10-CM

## 2024-01-21 MED ORDER — GLIPIZIDE ER 5 MG PO TB24: 5.0000 mg | ORAL_TABLET | Freq: Two times a day (BID) | ORAL | 0 refills | Status: DC

## 2024-02-04 ENCOUNTER — Other Ambulatory Visit: Payer: Self-pay | Admitting: "Endocrinology

## 2024-02-04 DIAGNOSIS — E119 Type 2 diabetes mellitus without complications: Secondary | ICD-10-CM

## 2024-02-08 ENCOUNTER — Telehealth: Payer: Self-pay | Admitting: "Endocrinology

## 2024-02-08 NOTE — Telephone Encounter (Signed)
 Pt states he has been taking Metformin  1000mg  bid, Glipizide  5mg  bid and Ozempic  1mg  weekly for the past 3wks. States his BG had been elevated until he increased his Metformin  to 1000mg  bid. Pt did not have his glucometer with him at the time of call but stated he would bring his meter to the office tomorrow to be evaluated.

## 2024-02-08 NOTE — Telephone Encounter (Signed)
 Pt said he has been taking 4 total of Metformin  each day due to his high sugars. He knows that his instructions state that take 2x a day. I asked him to provide me with some readings but he did not have them available. I advised him that you would probably be calling for those

## 2024-02-12 ENCOUNTER — Other Ambulatory Visit: Payer: Self-pay | Admitting: Urology

## 2024-02-22 ENCOUNTER — Ambulatory Visit (HOSPITAL_COMMUNITY)
Admission: RE | Admit: 2024-02-22 | Discharge: 2024-02-22 | Disposition: A | Discharge status: Home / Self Care | Payer: PRIVATE HEALTH INSURANCE | Source: Ambulatory Visit | Attending: Hematology | Admitting: Hematology

## 2024-02-22 DIAGNOSIS — C20 Malignant neoplasm of rectum: Secondary | ICD-10-CM | POA: Diagnosis present

## 2024-02-23 ENCOUNTER — Encounter (INDEPENDENT_AMBULATORY_CARE_PROVIDER_SITE_OTHER): Payer: Self-pay | Admitting: Gastroenterology

## 2024-02-25 ENCOUNTER — Other Ambulatory Visit: Payer: Self-pay

## 2024-02-25 ENCOUNTER — Other Ambulatory Visit

## 2024-02-25 DIAGNOSIS — E291 Testicular hypofunction: Secondary | ICD-10-CM

## 2024-02-29 ENCOUNTER — Ambulatory Visit (HOSPITAL_COMMUNITY)
Admission: RE | Admit: 2024-02-29 | Discharge: 2024-02-29 | Disposition: A | Discharge status: Home / Self Care | Payer: PRIVATE HEALTH INSURANCE | Source: Ambulatory Visit | Attending: Hematology | Admitting: Hematology

## 2024-02-29 DIAGNOSIS — C20 Malignant neoplasm of rectum: Secondary | ICD-10-CM | POA: Diagnosis present

## 2024-02-29 LAB — COMPREHENSIVE METABOLIC PANEL WITH GFR
ALT: 32 IU/L (ref 0–44)
AST: 19 IU/L (ref 0–40)
Albumin: 4.5 g/dL (ref 4.1–5.1)
Alkaline Phosphatase: 102 IU/L (ref 47–123)
BUN/Creatinine Ratio: 13 (ref 9–20)
BUN: 12 mg/dL (ref 6–24)
Bilirubin Total: 0.4 mg/dL (ref 0.0–1.2)
CO2: 24 mmol/L (ref 20–29)
Calcium: 9.6 mg/dL (ref 8.7–10.2)
Chloride: 95 mmol/L — ABNORMAL LOW (ref 96–106)
Creatinine, Ser: 0.92 mg/dL (ref 0.76–1.27)
Globulin, Total: 2.4 g/dL (ref 1.5–4.5)
Glucose: 186 mg/dL — ABNORMAL HIGH (ref 70–99)
Potassium: 4.5 mmol/L (ref 3.5–5.2)
Sodium: 133 mmol/L — ABNORMAL LOW (ref 134–144)
Total Protein: 6.9 g/dL (ref 6.0–8.5)
eGFR: 103 mL/min/1.73 (ref >59)

## 2024-02-29 LAB — CBC
Hematocrit: 49 % (ref 37.5–51.0)
Hemoglobin: 15.9 g/dL (ref 13.0–17.7)
MCH: 29 pg (ref 26.6–33.0)
MCHC: 32.4 g/dL (ref 31.5–35.7)
MCV: 89 fL (ref 79–97)
Platelets: 311 x10E3/uL (ref 150–450)
RBC: 5.48 x10E6/uL (ref 4.14–5.80)
RDW: 12.3 % (ref 11.6–15.4)
WBC: 4.2 x10E3/uL (ref 3.4–10.8)

## 2024-02-29 LAB — POCT I-STAT CREATININE: Creatinine, Ser: 0.9 mg/dL (ref 0.61–1.24)

## 2024-02-29 LAB — TESTOSTERONE,FREE AND TOTAL
Testosterone, Free: 3.4 pg/mL — ABNORMAL LOW (ref 6.8–21.5)
Testosterone: 217 ng/dL — ABNORMAL LOW (ref 264–916)

## 2024-02-29 LAB — ESTRADIOL: Estradiol: 14.9 pg/mL (ref 7.6–42.6)

## 2024-02-29 MED ORDER — IOHEXOL 9 MG/ML PO SOLN
ORAL | Status: AC
  Filled 2024-02-29: qty 1000

## 2024-02-29 MED ORDER — IOHEXOL 9 MG/ML PO SOLN
500.0000 mL | ORAL | Status: AC
  Administered 2024-02-29: 500 mL via ORAL

## 2024-02-29 MED ORDER — IOHEXOL 300 MG/ML  SOLN
100.0000 mL | Freq: Once | INTRAMUSCULAR | Status: AC | PRN
  Administered 2024-02-29: 100 mL via INTRAVENOUS

## 2024-03-03 ENCOUNTER — Telehealth: Payer: Self-pay

## 2024-03-03 NOTE — Telephone Encounter (Signed)
 Patient is working out of town.  He cannot make his October 27 appointment. Had labs done.   Asking where he could be worked into Dr. Little schedule.  Please advise.  Call:  [phone]

## 2024-03-06 ENCOUNTER — Ambulatory Visit: Admitting: Urology

## 2024-03-06 DIAGNOSIS — E291 Testicular hypofunction: Secondary | ICD-10-CM

## 2024-03-09 ENCOUNTER — Ambulatory Visit: Admitting: Hematology

## 2024-03-09 ENCOUNTER — Inpatient Hospital Stay: Admitting: Oncology

## 2024-03-20 ENCOUNTER — Ambulatory Visit: Admitting: Family Medicine

## 2024-03-20 ENCOUNTER — Encounter: Payer: Self-pay | Admitting: Family Medicine

## 2024-03-20 VITALS — BP 132/81 | HR 87 | Ht 71.0 in | Wt 245.0 lb

## 2024-03-20 DIAGNOSIS — R0683 Snoring: Secondary | ICD-10-CM | POA: Diagnosis not present

## 2024-03-20 DIAGNOSIS — E1140 Type 2 diabetes mellitus with diabetic neuropathy, unspecified: Secondary | ICD-10-CM | POA: Diagnosis not present

## 2024-03-20 DIAGNOSIS — E1165 Type 2 diabetes mellitus with hyperglycemia: Secondary | ICD-10-CM | POA: Diagnosis not present

## 2024-03-20 DIAGNOSIS — E559 Vitamin D deficiency, unspecified: Secondary | ICD-10-CM

## 2024-03-20 DIAGNOSIS — E038 Other specified hypothyroidism: Secondary | ICD-10-CM

## 2024-03-20 DIAGNOSIS — E782 Mixed hyperlipidemia: Secondary | ICD-10-CM | POA: Diagnosis not present

## 2024-03-20 DIAGNOSIS — I10 Essential (primary) hypertension: Secondary | ICD-10-CM

## 2024-03-20 DIAGNOSIS — Z7984 Long term (current) use of oral hypoglycemic drugs: Secondary | ICD-10-CM | POA: Diagnosis not present

## 2024-03-20 DIAGNOSIS — H6522 Chronic serous otitis media, left ear: Secondary | ICD-10-CM | POA: Diagnosis not present

## 2024-03-20 MED ORDER — GABAPENTIN 800 MG PO TABS: 800.0000 mg | ORAL_TABLET | Freq: Three times a day (TID) | ORAL | 1 refills | Status: AC

## 2024-03-20 MED ORDER — METFORMIN HCL 500 MG PO TABS: 500.0000 mg | ORAL_TABLET | Freq: Two times a day (BID) | ORAL | 0 refills | Status: DC

## 2024-03-20 MED ORDER — AMOXICILLIN-POT CLAVULANATE 875-125 MG PO TABS: 1.0000 | ORAL_TABLET | Freq: Two times a day (BID) | ORAL | 0 refills | Status: AC

## 2024-03-20 MED ORDER — SEMAGLUTIDE (2 MG/DOSE) 8 MG/3ML ~~LOC~~ SOPN: 2.0000 mg | PEN_INJECTOR | SUBCUTANEOUS | 1 refills | Status: AC

## 2024-03-20 NOTE — Patient Instructions (Addendum)
 I appreciate the opportunity to provide care to you today!    Follow up:  4 months  Labs: please stop by the lab today to get your blood drawn (CBC, CMP, TSH, Lipid profile, HgA1c, Vit D)  Neuropathy -Start gabapentin  800 mg PO TID for neuropathic pain management. -Recommended adherence to medication as prescribed and monitoring for side effects such as drowsiness or dizziness. -Encourage maintaining good glycemic control (if diabetic) and engaging in regular physical activity as tolerated.  Otitis Media -Start Augmentin  875-125 mg PO BID for 7-10 days (adjust per protocol). -Recommended completing the full course of antibiotics even if symptoms improve. -Encourage increased hydration and use of warm compresses for ear discomfort. -Follow up if symptoms persist, worsen, or if fever, ear drainage, or hearing changes develop.    Please follow up if your symptoms worsen or fail to improve.     Please continue to a heart-healthy diet and increase your physical activities. Try to exercise for at least five days a week.    It was a pleasure to see you and I look forward to continuing to work together on your health and well-being. Please do not hesitate to call the office if you need care or have questions about your care.  In case of emergency, please visit the Emergency Department for urgent care, or contact our clinic at [phone] to schedule an appointment. We're here to help you!   Have a wonderful day and week. With Gratitude, Meade JENEANE Gerlach MSN, FNP-BC, PMHNP-BC

## 2024-03-20 NOTE — Assessment & Plan Note (Signed)
 Encouraged to start taking  Augmentin  875-125 mg PO BID for 7-10 days. -Recommended completing the full course of antibiotics even if symptoms improve. -Encourage increased hydration and use of warm compresses for ear discomfort. -Follow up if symptoms persist, worsen, or if fever, ear drainage, or hearing changes develop.

## 2024-03-20 NOTE — Progress Notes (Signed)
 Established Patient Office Visit  Subjective:  Patient ID: Raymond Malone, male    DOB: 03-10-1976  Age: 48 y.o. MRN: [id number]  CC:  Chief Complaint  Patient presents with   Medical Management of Chronic Issues    Four month follow up    Ear Pain    Left ear pain    Peripheral Neuropathy    Pain     HPI Raymond Malone is a 48 y.o. male with past medical history of HTN, T2DM, GERD presents for f/u of  chronic medical conditions.  Neuropathy:The patient reports using an over-the-counter spray, which provides some relief. He experienced a severe charley horse in his leg recently. Currently takes gabapentin  600 mg three times daily with minimal relief.  Left Ear Pain:The patient reports left ear pain, ear fullness, and discomfort in both ears that began last week. She also reports fever, drainage, and decreased hearing in the left ear. History of frequent ear infections noted.   Past Medical History:  Diagnosis Date   Abscess, axilla 10/06/2016   Alcohol dependence (HCC) 10/06/2016   Current smoker 10/06/2016   Dyshidrotic eczema 10/27/2016   GERD (gastroesophageal reflux disease)    Hyperlipidemia    Hyperosmolar non-ketotic state in patient with type 2 diabetes mellitus (HCC) 10/06/2016   Hypertension    MRSA (methicillin resistant Staphylococcus aureus)    Skin abscess    Type 2 diabetes mellitus (HCC)     Past Surgical History:  Procedure Laterality Date   ANKLE SURGERY Left    BIOPSY  01/06/2022   Procedure: BIOPSY;  Surgeon: Eartha Angelia Sieving, MD;  Location: [facility];  Service: Gastroenterology;;   BIOPSY  07/16/2022   Procedure: BIOPSY;  Surgeon: Eartha Angelia Sieving, MD;  [facility]: [facility];  Service: Gastroenterology;;   BIOPSY  01/07/2023   Procedure: BIOPSY;  Surgeon: Eartha Angelia Sieving, MD;  [facility]: [facility];  Service: Gastroenterology;;   COLONOSCOPY N/A 08/18/2023   Procedure: COLONOSCOPY;  Surgeon: Eartha Angelia Sieving,  MD;  [facility]: [facility];  Service: Gastroenterology;  Laterality: N/A;  10:45AM;ASA 1   COLONOSCOPY WITH PROPOFOL  N/A 01/06/2022   Procedure: COLONOSCOPY WITH PROPOFOL ;  Surgeon: Eartha Angelia Sieving, MD;  Location: [facility];  Service: Gastroenterology;  Laterality: N/A;  730   FLEXIBLE SIGMOIDOSCOPY N/A 07/16/2022   Procedure: FLEXIBLE SIGMOIDOSCOPY;  Surgeon: Eartha Angelia Sieving, MD;  Location: [facility];  Service: Gastroenterology;  Laterality: N/A;  215pm, asa 1-2   FLEXIBLE SIGMOIDOSCOPY N/A 01/07/2023   Procedure: FLEXIBLE SIGMOIDOSCOPY;  Surgeon: Eartha Angelia Sieving, MD;  Location: [facility];  Service: Gastroenterology;  Laterality: N/A;  10:15AM;ASA 1   HAND SURGERY     boxer's fracture L hand   INCISION AND DRAINAGE PERIRECTAL ABSCESS     IR IMAGING GUIDED PORT INSERTION  02/10/2022   MYRINGOTOMY WITH TUBE PLACEMENT Bilateral 07/22/2020   Procedure: BILATERAL MYRINGOTOMY WITH TUBE PLACEMENT;  Surgeon: Karis Clunes, MD;  Location: [facility];  Service: ENT;  Laterality: Bilateral;   POLYPECTOMY  01/06/2022   Procedure: POLYPECTOMY INTESTINAL;  Surgeon: Eartha Angelia Sieving, MD;  Location: [facility];  Service: Gastroenterology;;   POLYPECTOMY  08/18/2023   Procedure: POLYPECTOMY, INTESTINE;  Surgeon: Eartha Angelia Sieving, MD;  Location: [facility];  Service: Gastroenterology;;   SVT ABLATION N/A 04/16/2021   Procedure: SVT ABLATION;  Surgeon: Waddell Danelle ORN, MD;  Location: [facility];  Service: Cardiovascular;  Laterality: N/A;  Family History  Problem Relation Age of Onset   Hypertension Mother     Social History   Socioeconomic History   Marital status: Single    Spouse name: Shameka   Number of children: 7   Years of education: 14   Highest education level: Not on file  Occupational History   Occupation: factory work, sorting  Tobacco Use   Smoking status: Former    Current packs/day: 1.00     Average packs/day: 1 pack/day for 29.9 years (29.9 ttl pk-yrs)    Types: Cigarettes    Start date: 05/11/1994   Smokeless tobacco: Never  Vaping Use   Vaping status: Every Day   Substances: Nicotine , Flavoring  Substance and Sexual Activity   Alcohol use: Not Currently   Drug use: Not Currently    Types: Marijuana   Sexual activity: Yes    Birth control/protection: Condom  Other Topics Concern   Not on file  Social History Narrative   Lives with fiancee, mother, and 7 children, 3 are biological his, 2 are hers and 1 nephew   No pets in the home      Enjoys: spending time with the kids      Diet: Eats all food groups, does not have a true focus for diabetes   Caffeine: Drinks about 1 or 2 sodas a week overall has reduced his caffeine intake sometimes some tea   Water : Reports taking 6-8 times a day for more      Wears a seatbelt, does not use his phone or driving   Smoke detectors at home   Does not have fire extinguisher at this time   No weapons in the home   Social Drivers of Health   Financial Resource Strain: Low Risk  (02/09/2020)   Overall Financial Resource Strain (CARDIA)    Difficulty of Paying Living Expenses: Not hard at all  Food Insecurity: No Food Insecurity (02/09/2020)   Hunger Vital Sign    Worried About Running Out of Food in the Last Year: Never true    Ran Out of Food in the Last Year: Never true  Transportation Needs: No Transportation Needs (02/09/2020)   PRAPARE - Administrator, Civil Service (Medical): No    Lack of Transportation (Non-Medical): No  Physical Activity: Inactive (02/09/2020)   Exercise Vital Sign    Days of Exercise per Week: 0 days    Minutes of Exercise per Session: 0 min  Stress: Stress Concern Present (02/09/2020)   [employer] of Occupational Health - Occupational Stress Questionnaire    Feeling of Stress : To some extent  Social Connections: Moderately Isolated (02/09/2020)   Social Connection and Isolation  Panel    Frequency of Communication with Friends and Family: More than three times a week    Frequency of Social Gatherings with Friends and Family: More than three times a week    Attends Religious Services: Never    Database Administrator or Organizations: No    Attends Banker Meetings: Never    Marital Status: Living with partner  Intimate Partner Violence: Not At Risk (02/09/2020)   Humiliation, Afraid, Rape, and Kick questionnaire    Fear of Current or Ex-Partner: No    Emotionally Abused: No    Physically Abused: No    Sexually Abused: No    Outpatient Medications Prior to Visit  Medication Sig Dispense Refill   Accu-Chek Softclix Lancets lancets 1 each by Other route 2 (two) times  daily. Use as instructed 100 each 2   alfuzosin  (UROXATRAL ) 10 MG 24 hr tablet Take 1 tablet (10 mg total) by mouth at bedtime. 30 tablet 11   blood glucose meter kit and supplies Dispense based on patient and insurance preference. Use up to four times daily as directed. (FOR ICD-10 E10.9, E11.9). 1 each 0   Blood Glucose Monitoring Suppl (ACCU-CHEK GUIDE ME) w/Device KIT Use to test BG bid. E11.65 1 kit 0   cephALEXin  (KEFLEX ) 500 MG capsule Take 1 capsule (500 mg total) by mouth 3 (three) times daily. 28 capsule 0   Continuous Glucose Sensor (FREESTYLE LIBRE 3 SENSOR) MISC Place 1 sensor on the skin every 14 days. Use to check glucose continuously (Patient not taking: Reported on 12/28/2023) 2 each 6   docusate sodium  (COLACE) 100 MG capsule Take 1 capsule (100 mg total) by mouth 2 (two) times daily. (Patient not taking: Reported on 12/28/2023) 60 capsule 0   glipiZIDE  (GLUCOTROL  XL) 5 MG 24 hr tablet Take 1 tablet (5 mg total) by mouth 2 (two) times daily. 180 tablet 0   glucose blood (ACCU-CHEK GUIDE) test strip USE 1 STRIP TO TEST GLUCOSE TWICE DAILY E11.65 100 each 1   hydrocortisone -pramoxine (PROCTOFOAM-HC) rectal foam Place 1 applicator rectally 2 (two) times daily. (Patient not taking:  Reported on 12/28/2023) 10 g 3   ibuprofen  (ADVIL ) 800 MG tablet Take 1 tablet (800 mg total) by mouth 3 (three) times daily. Take with food 21 tablet 0   lactulose  (CHRONULAC ) 10 GM/15ML solution Take 15 mLs (10 g total) by mouth at bedtime. If Colace is not working or 3 days w/o BM Take 15mL at bedtime every night to assist with regular bowel movements. Titrate down if having multiple bowel movements. If a bowel movement has not occurred in 3-4 days or longer take 15mL every 3 hours until a bowel movement has occurred. (Patient not taking: Reported on 12/28/2023) 236 mL 1   lisinopril -hydrochlorothiazide  (ZESTORETIC ) 20-25 MG tablet Take 1 tablet by mouth daily. 90 tablet 1   nicotine  (NICODERM CQ  - DOSED IN MG/24 HOURS) 21 mg/24hr patch Place 1 patch (21 mg total) onto the skin daily. 28 patch 0   ofloxacin  (FLOXIN ) 0.3 % OTIC solution Place 5 drops into the both ear twice daily for 7 days 10 mL 0   pantoprazole  (PROTONIX ) 40 MG tablet Take 1 tablet by mouth once daily 30 tablet 0   pyridOXINE (VITAMIN B6) 50 MG tablet Take 1 tablet (50 mg total) by mouth daily. (Patient not taking: Reported on 12/28/2023) 30 tablet 1   senna (SENOKOT) 8.6 MG TABS tablet Take 1 tablet (8.6 mg total) by mouth daily. 120 tablet 0   sildenafil  (VIAGRA ) 100 MG tablet Take 0.5 tablets (50 mg total) by mouth daily as needed for erectile dysfunction. Erectile dysfunction: recommended to take 1 hour prior to sexual activity; do not take >1 dose per day (Patient not taking: Reported on 12/28/2023) 20 tablet 1   simvastatin  (ZOCOR ) 40 MG tablet TAKE 1 TABLET BY MOUTH ONCE DAILY IN THE EVENING 90 tablet 0   tadalafil  (CIALIS ) 20 MG tablet Take 1 tablet (20 mg total) by mouth as needed. (Patient not taking: Reported on 12/28/2023) 30 tablet 5   terbinafine  (LAMISIL ) 1 % cream Apply 1 Application topically 2 (two) times daily. Apply to clean dry skin between your toes 30 g 0   Testosterone  1.62 % GEL APPLY 2 PUMPS TOPICALLY  AS  DIRECTED ONCE DAILY 75  g 0   Vitamin D , Ergocalciferol , (DRISDOL ) 1.25 MG (50000 UNIT) CAPS capsule Take 1 capsule (50,000 Units total) by mouth every 7 (seven) days. (Patient not taking: Reported on 12/28/2023) 20 capsule 1   gabapentin  (NEURONTIN ) 300 MG capsule Take 2 capsules (600 mg total) by mouth 3 (three) times daily. 180 capsule 3   metFORMIN  (GLUCOPHAGE ) 500 MG tablet TAKE 1 TABLET BY MOUTH TWICE DAILY WITH A MEAL 180 tablet 0   Semaglutide , 1 MG/DOSE, (OZEMPIC , 1 MG/DOSE,) 4 MG/3ML SOPN Inject 1 mg into the skin once a week. 3 mL 2   No facility-administered medications prior to visit.    No Known Allergies  ROS Review of Systems  Constitutional:  Negative for fatigue and fever.  HENT:  Positive for ear pain.   Eyes:  Negative for visual disturbance.  Respiratory:  Negative for chest tightness and shortness of breath.   Cardiovascular:  Negative for chest pain and palpitations.  Neurological:  Positive for numbness. Negative for dizziness and headaches.      Objective:    Physical Exam HENT:     Head: Normocephalic.     Right Ear: External ear normal.     Left Ear: External ear normal. Tenderness present. Tympanic membrane is erythematous.     Nose: No congestion or rhinorrhea.     Mouth/Throat:     Mouth: Mucous membranes are moist.  Cardiovascular:     Rate and Rhythm: Regular rhythm.     Heart sounds: No murmur heard. Pulmonary:     Effort: No respiratory distress.     Breath sounds: Normal breath sounds.  Neurological:     Mental Status: He is alert.     BP 132/81   Pulse 87   Ht 5' 11 (1.803 m)   Wt 245 lb (111.1 kg)   SpO2 97%   BMI 34.17 kg/m  Wt Readings from Last 3 Encounters:  03/20/24 245 lb (111.1 kg)  01/12/24 248 lb (112.5 kg)  12/28/23 248 lb 12.8 oz (112.9 kg)    Lab Results  Component Value Date   TSH 0.790 11/15/2023   Lab Results  Component Value Date   WBC 4.2 02/28/2024   HGB 15.9 02/28/2024   HCT 49.0 02/28/2024   MCV  89 02/28/2024   PLT 311 02/28/2024   Lab Results  Component Value Date   NA 133 (L) 02/28/2024   K 4.5 02/28/2024   CO2 24 02/28/2024   GLUCOSE 186 (H) 02/28/2024   BUN 12 02/28/2024   CREATININE 0.90 02/29/2024   BILITOT 0.4 02/28/2024   ALKPHOS 102 02/28/2024   AST 19 02/28/2024   ALT 32 02/28/2024   PROT 6.9 02/28/2024   ALBUMIN 4.5 02/28/2024   CALCIUM  9.6 02/28/2024   ANIONGAP 10 08/30/2023   EGFR 103 02/28/2024   Lab Results  Component Value Date   CHOL 156 11/15/2023   Lab Results  Component Value Date   HDL 44 11/15/2023   Lab Results  Component Value Date   LDLCALC 100 (H) 11/15/2023   Lab Results  Component Value Date   TRIG 60 11/15/2023   Lab Results  Component Value Date   CHOLHDL 3.5 11/15/2023   Lab Results  Component Value Date   HGBA1C 8.2 (H) 11/15/2023      Assessment & Plan:  Essential hypertension Assessment & Plan: Controlled today Encouraged to continue treatment regimen as is  BP Readings from Last 3 Encounters:  03/20/24 132/81  01/12/24 (!) 148/90  12/28/23  118/80      Type 2 diabetes mellitus with diabetic neuropathy, without long-term current use of insulin  (HCC) Assessment & Plan: Encouraged to start taking gabapentin  800 mg TID Recommended adherence to medication as prescribed and monitoring for side effects such as drowsiness or dizziness. -Encourage maintaining good glycemic control (if diabetic) and engaging in regular physical activity as tolerated.   Orders: -     Gabapentin ; Take 1 tablet (800 mg total) by mouth 3 (three) times daily.  Dispense: 140 tablet; Refill: 1  Loud snoring Assessment & Plan: He reports snoring loudly, feeling tired even after a full night's sleep, and never feeling rested during the day. Referral placed    Orders: -     Ambulatory referral to Sleep Studies  Left chronic serous otitis media Assessment & Plan: Encouraged to start taking  Augmentin  875-125 mg PO BID for 7-10  days. -Recommended completing the full course of antibiotics even if symptoms improve. -Encourage increased hydration and use of warm compresses for ear discomfort. -Follow up if symptoms persist, worsen, or if fever, ear drainage, or hearing changes develop.  Orders: -     Amoxicillin -Pot Clavulanate; Take 1 tablet by mouth 2 (two) times daily for 7 days.  Dispense: 14 tablet; Refill: 0  Type 2 diabetes mellitus with hyperglycemia, without long-term current use of insulin  (HCC) -     metFORMIN  HCl; Take 1 tablet (500 mg total) by mouth 2 (two) times daily with a meal.  Dispense: 180 tablet; Refill: 0 -     Semaglutide  (2 MG/DOSE); Inject 2 mg as directed once a week.  Dispense: 8 mL; Refill: 1 -     Hemoglobin A1c -     Microalbumin / creatinine urine ratio  Vitamin D  deficiency -     VITAMIN D  25 Hydroxy (Vit-D Deficiency, Fractures)  TSH (thyroid -stimulating hormone deficiency) -     TSH + free T4  Mixed hyperlipidemia -     Lipid panel -     CMP14+EGFR -     CBC with Differential/Platelet  Note: This chart has been completed using Engineer, Civil (consulting) software, and while attempts have been made to ensure accuracy, certain words and phrases may not be transcribed as intended.    Follow-up: Return in about 4 months (around 07/18/2024).   [place]  Z Bacchus, FNP

## 2024-03-20 NOTE — Assessment & Plan Note (Signed)
 He reports snoring loudly, feeling tired even after a full night's sleep, and never feeling rested during the day. Referral placed

## 2024-03-20 NOTE — Assessment & Plan Note (Signed)
 Controlled today Encouraged to continue treatment regimen as is  BP Readings from Last 3 Encounters:  03/20/24 132/81  01/12/24 (!) 148/90  12/28/23 118/80

## 2024-03-20 NOTE — Assessment & Plan Note (Addendum)
 Encouraged to start taking gabapentin  800 mg TID Recommended adherence to medication as prescribed and monitoring for side effects such as drowsiness or dizziness. -Encourage maintaining good glycemic control (if diabetic) and engaging in regular physical activity as tolerated.

## 2024-03-21 ENCOUNTER — Telehealth: Payer: Self-pay | Admitting: Pharmacy Technician

## 2024-03-21 ENCOUNTER — Other Ambulatory Visit (HOSPITAL_COMMUNITY): Payer: Self-pay

## 2024-03-21 LAB — CMP14+EGFR
ALT: 30 IU/L (ref 0–44)
AST: 19 IU/L (ref 0–40)
Albumin: 4.3 g/dL (ref 4.1–5.1)
Alkaline Phosphatase: 85 IU/L (ref 47–123)
BUN/Creatinine Ratio: 19 (ref 9–20)
BUN: 15 mg/dL (ref 6–24)
Bilirubin Total: 0.3 mg/dL (ref 0.0–1.2)
CO2: 21 mmol/L (ref 20–29)
Calcium: 9.2 mg/dL (ref 8.7–10.2)
Chloride: 103 mmol/L (ref 96–106)
Creatinine, Ser: 0.78 mg/dL (ref 0.76–1.27)
Globulin, Total: 2 g/dL (ref 1.5–4.5)
Glucose: 157 mg/dL — ABNORMAL HIGH (ref 70–99)
Potassium: 4.5 mmol/L (ref 3.5–5.2)
Sodium: 137 mmol/L (ref 134–144)
Total Protein: 6.3 g/dL (ref 6.0–8.5)
eGFR: 111 mL/min/1.73 (ref >59)

## 2024-03-21 LAB — CBC WITH DIFFERENTIAL/PLATELET
Basophils Absolute: 0.1 x10E3/uL (ref 0.0–0.2)
Basos: 2 %
EOS (ABSOLUTE): 0.4 x10E3/uL (ref 0.0–0.4)
Eos: 8 %
Hematocrit: 44.7 % (ref 37.5–51.0)
Hemoglobin: 14.7 g/dL (ref 13.0–17.7)
Immature Grans (Abs): 0 x10E3/uL (ref 0.0–0.1)
Immature Granulocytes: 0 %
Lymphocytes Absolute: 0.6 x10E3/uL — ABNORMAL LOW (ref 0.7–3.1)
Lymphs: 14 %
MCH: 29.6 pg (ref 26.6–33.0)
MCHC: 32.9 g/dL (ref 31.5–35.7)
MCV: 90 fL (ref 79–97)
Monocytes Absolute: 0.6 x10E3/uL (ref 0.1–0.9)
Monocytes: 12 %
Neutrophils Absolute: 2.9 x10E3/uL (ref 1.4–7.0)
Neutrophils: 64 %
Platelets: 310 x10E3/uL (ref 150–450)
RBC: 4.97 x10E6/uL (ref 4.14–5.80)
RDW: 12.5 % (ref 11.6–15.4)
WBC: 4.6 x10E3/uL (ref 3.4–10.8)

## 2024-03-21 LAB — LIPID PANEL
Chol/HDL Ratio: 3.4 ratio (ref 0.0–5.0)
Cholesterol, Total: 139 mg/dL (ref 100–199)
HDL: 41 mg/dL (ref >39)
LDL Chol Calc (NIH): 74 mg/dL (ref 0–99)
Triglycerides: 139 mg/dL (ref 0–149)
VLDL Cholesterol Cal: 24 mg/dL (ref 5–40)

## 2024-03-21 LAB — HEMOGLOBIN A1C
Est. average glucose Bld gHb Est-mCnc: 209 mg/dL
Hgb A1c MFr Bld: 8.9 % — ABNORMAL HIGH (ref 4.8–5.6)

## 2024-03-21 LAB — TSH+FREE T4
Free T4: 1.34 ng/dL (ref 0.82–1.77)
TSH: 0.645 u[IU]/mL (ref 0.450–4.500)

## 2024-03-21 LAB — VITAMIN D 25 HYDROXY (VIT D DEFICIENCY, FRACTURES): Vit D, 25-Hydroxy: 13.7 ng/mL — ABNORMAL LOW (ref 30.0–100.0)

## 2024-03-21 NOTE — Telephone Encounter (Signed)
 Pharmacy Patient Advocate Encounter   Received notification from CoverMyMeds that prior authorization for Ozempic  (2 MG/DOSE) 8MG /3ML pen-injectors is required/requested.   Insurance verification completed.   The patient is insured through [facility].   Per test claim: PA required; PA submitted to above mentioned insurance via Latent Key/confirmation #/EOC [id number] Status is pending

## 2024-03-21 NOTE — Telephone Encounter (Signed)
 Pharmacy Patient Advocate Encounter  Received notification from [employer] that Prior Authorization for Ozempic  (2 MG/DOSE) 8MG /3ML pen-injectors has been APPROVED from 03/21/2024 to 03/21/2025. Ran test claim, Copay is $4.00. This test claim was processed through [facility]- copay amounts may vary at other pharmacies due to pharmacy/plan contracts, or as the patient moves through the different stages of their insurance plan.   PA #/Case ID/Reference #: [id number]   Copay factors in the patient having [facility]  Medicaid as secondary coverage. Copay is $150.00 without Medicaid coverage.

## 2024-03-23 ENCOUNTER — Ambulatory Visit: Payer: Self-pay | Admitting: Family Medicine

## 2024-03-23 DIAGNOSIS — E559 Vitamin D deficiency, unspecified: Secondary | ICD-10-CM

## 2024-03-23 DIAGNOSIS — E1165 Type 2 diabetes mellitus with hyperglycemia: Secondary | ICD-10-CM

## 2024-03-23 MED ORDER — METFORMIN HCL 1000 MG PO TABS: 1000.0000 mg | ORAL_TABLET | Freq: Two times a day (BID) | ORAL | 3 refills | Status: AC

## 2024-03-23 MED ORDER — GLIPIZIDE 10 MG PO TABS: 10.0000 mg | ORAL_TABLET | Freq: Two times a day (BID) | ORAL | 3 refills | Status: AC

## 2024-03-23 MED ORDER — VITAMIN D (ERGOCALCIFEROL) 1.25 MG (50000 UNIT) PO CAPS: 50000.0000 [IU] | ORAL_CAPSULE | ORAL | 1 refills | Status: AC

## 2024-04-04 ENCOUNTER — Inpatient Hospital Stay: Admitting: Oncology

## 2024-04-13 ENCOUNTER — Other Ambulatory Visit: Payer: Self-pay | Admitting: Family Medicine

## 2024-04-13 DIAGNOSIS — K219 Gastro-esophageal reflux disease without esophagitis: Secondary | ICD-10-CM

## 2024-04-14 ENCOUNTER — Encounter: Payer: Self-pay | Admitting: Emergency Medicine

## 2024-04-14 ENCOUNTER — Ambulatory Visit
Admission: EM | Admit: 2024-04-14 | Discharge: 2024-04-14 | Disposition: A | Discharge status: Home / Self Care | Attending: Student | Admitting: Student

## 2024-04-14 ENCOUNTER — Encounter (INDEPENDENT_AMBULATORY_CARE_PROVIDER_SITE_OTHER): Payer: Self-pay

## 2024-04-14 ENCOUNTER — Other Ambulatory Visit: Payer: Self-pay

## 2024-04-14 ENCOUNTER — Other Ambulatory Visit: Payer: Self-pay | Admitting: Family Medicine

## 2024-04-14 DIAGNOSIS — E785 Hyperlipidemia, unspecified: Secondary | ICD-10-CM

## 2024-04-14 DIAGNOSIS — E119 Type 2 diabetes mellitus without complications: Secondary | ICD-10-CM

## 2024-04-14 DIAGNOSIS — H66006 Acute suppurative otitis media without spontaneous rupture of ear drum, recurrent, bilateral: Secondary | ICD-10-CM | POA: Diagnosis not present

## 2024-04-14 MED ORDER — CEFDINIR 300 MG PO CAPS: 300.0000 mg | ORAL_CAPSULE | Freq: Two times a day (BID) | ORAL | 0 refills | Status: AC

## 2024-04-14 MED ORDER — OFLOXACIN 0.3 % OT SOLN: 3.0000 [drp] | Freq: Two times a day (BID) | OTIC | 0 refills | Status: AC

## 2024-04-14 NOTE — ED Triage Notes (Signed)
 Pt reports recurrent hx of ear infections. Pt reports left ear continuing to drain and reports right ear pressure as well. Has been on several rounds of abx and ear drops. Denies any known injury.

## 2024-04-14 NOTE — ED Provider Notes (Signed)
 [facility]    CSN: [id number] Arrival date & time: 04/14/24  1347      History   Chief Complaint Chief Complaint  Patient presents with   Ear Drainage    HPI Raymond Malone is a 48 y.o. male presenting w ear issue. H/o chronic serous otitis media of both ears.  Tympanostomy tubes were placed by Dr. Karis > 3 years ago, and are still in place.   Pt reports recurrent hx of ear infections. Pt reports left ear continuing to drain and reports right ear pressure as well. Has been on several rounds of abx and ear drops. Denies any known injury. Completed doxycycline ? On 04/09/24 without improvement.     HPI  Past Medical History:  Diagnosis Date   Abscess, axilla 10/06/2016   Alcohol dependence (HCC) 10/06/2016   Current smoker 10/06/2016   Dyshidrotic eczema 10/27/2016   GERD (gastroesophageal reflux disease)    Hyperlipidemia    Hyperosmolar non-ketotic state in patient with type 2 diabetes mellitus (HCC) 10/06/2016   Hypertension    MRSA (methicillin resistant Staphylococcus aureus)    Skin abscess    Type 2 diabetes mellitus (HCC)     Patient Active Problem List   Diagnosis Date Noted   Left chronic serous otitis media 03/20/2024   Prostate cancer screening 11/15/2023   Tobacco use 11/15/2023   Type 2 diabetes mellitus with hyperglycemia, without long-term current use of insulin  (HCC) 11/03/2023   Constipation 06/21/2023   Type 2 diabetes mellitus with diabetic neuropathy, unspecified (HCC) 06/21/2023   Encounter for annual general medical examination with abnormal findings in adult 02/15/2023   History of rectal cancer 01/07/2023   Hypokalemia 05/06/2022   Chronic pain of left ankle 02/04/2022   Rectal cancer (HCC) 01/07/2022   Rectal adenocarcinoma (HCC) 01/07/2022   SVT (supraventricular tachycardia) 04/16/2021   Class 1 obesity due to excess calories with serious comorbidity and body mass index (BMI) of 32.0 to 32.9 in adult 03/05/2021   GERD  (gastroesophageal reflux disease) 07/03/2020   Erectile dysfunction 06/19/2020   Mixed hyperlipidemia 02/09/2020   Blurred vision 02/09/2020   Need for immunization against influenza 02/09/2020   Intermittent palpitations 02/09/2020   Bilateral chronic serous otitis media 11/16/2017   Conductive hearing loss, bilateral 11/16/2017   Loud snoring 11/16/2017   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   Obesity (BMI 30.0-34.9) 12/22/2016   Essential hypertension 10/14/2016   Benign essential hypertension 10/14/2016   Skin abscess     Past Surgical History:  Procedure Laterality Date   ANKLE SURGERY Left    BIOPSY  01/06/2022   Procedure: BIOPSY;  Surgeon: Eartha Angelia Sieving, MD;  Location: [facility];  Service: Gastroenterology;;   BIOPSY  07/16/2022   Procedure: BIOPSY;  Surgeon: Eartha Angelia Sieving, MD;  Location: [facility];  Service: Gastroenterology;;   BIOPSY  01/07/2023   Procedure: BIOPSY;  Surgeon: Eartha Angelia Sieving, MD;  Location: [facility];  Service: Gastroenterology;;   COLONOSCOPY N/A 08/18/2023   Procedure: COLONOSCOPY;  Surgeon: Eartha Angelia Sieving, MD;  Location: [facility];  Service: Gastroenterology;  Laterality: N/A;  10:45AM;ASA 1   COLONOSCOPY WITH PROPOFOL  N/A 01/06/2022   Procedure: COLONOSCOPY WITH PROPOFOL ;  Surgeon: Eartha Angelia Sieving, MD;  Location: [facility];  Service: Gastroenterology;  Laterality: N/A;  730   FLEXIBLE SIGMOIDOSCOPY N/A 07/16/2022   Procedure: FLEXIBLE SIGMOIDOSCOPY;  Surgeon: Eartha Angelia Sieving, MD;  Location: [facility];  Service: Gastroenterology;  Laterality: N/A;  215pm, asa 1-2   FLEXIBLE SIGMOIDOSCOPY N/A 01/07/2023   Procedure: FLEXIBLE SIGMOIDOSCOPY;  Surgeon: Eartha Angelia Sieving, MD;  Location: [facility];  Service: Gastroenterology;  Laterality: N/A;  10:15AM;ASA 1   HAND SURGERY     boxer's fracture L hand   INCISION AND DRAINAGE PERIRECTAL ABSCESS     IR  IMAGING GUIDED PORT INSERTION  02/10/2022   MYRINGOTOMY WITH TUBE PLACEMENT Bilateral 07/22/2020   Procedure: BILATERAL MYRINGOTOMY WITH TUBE PLACEMENT;  Surgeon: Karis Clunes, MD;  Location: [facility];  Service: ENT;  Laterality: Bilateral;   POLYPECTOMY  01/06/2022   Procedure: POLYPECTOMY INTESTINAL;  Surgeon: Eartha Angelia Sieving, MD;  Location: [facility];  Service: Gastroenterology;;   POLYPECTOMY  08/18/2023   Procedure: POLYPECTOMY, INTESTINE;  Surgeon: Eartha Angelia Sieving, MD;  Location: [facility];  Service: Gastroenterology;;   SVT ABLATION N/A 04/16/2021   Procedure: SVT ABLATION;  Surgeon: Waddell Danelle ORN, MD;  Location: [facility];  Service: Cardiovascular;  Laterality: N/A;       Home Medications    Prior to Admission medications   Medication Sig Start Date End Date Taking? Authorizing Provider  cefdinir  (OMNICEF ) 300 MG capsule Take 1 capsule (300 mg total) by mouth 2 (two) times daily for 10 days. 04/14/24 04/24/24 Yes Angelissa Supan E, PA-C  ofloxacin  (FLOXIN ) 0.3 % OTIC solution Place 3 drops into both ears 2 (two) times daily for 10 days. 04/14/24 04/24/24 Yes Marianne Golightly E, PA-C  Accu-Chek Softclix Lancets lancets 1 each by Other route 2 (two) times daily. Use as instructed 12/09/21   Nida, Gebreselassie W, MD  alfuzosin  (UROXATRAL ) 10 MG 24 hr tablet Take 1 tablet (10 mg total) by mouth at bedtime. 07/30/23   McKenzie, Belvie CROME, MD  blood glucose meter kit and supplies Dispense based on patient and insurance preference. Use up to four times daily as directed. (FOR ICD-10 E10.9, E11.9). 02/10/21   Elnor Fairy HERO, NP  Blood Glucose Monitoring Suppl (ACCU-CHEK GUIDE ME) w/Device KIT Use to test BG bid. E11.65 12/22/22   Nida, Gebreselassie W, MD  gabapentin  (NEURONTIN ) 800 MG tablet Take 1 tablet (800 mg total) by mouth 3 (three) times daily. 03/20/24   Bacchus, Meade PEDLAR, FNP  glipiZIDE  (GLUCOTROL ) 10 MG tablet Take 1 tablet (10 mg total) by  mouth 2 (two) times daily before a meal. 03/23/24   Bacchus, Meade PEDLAR, FNP  glucose blood (ACCU-CHEK GUIDE) test strip USE 1 STRIP TO TEST GLUCOSE TWICE DAILY E11.65 12/22/22   Nida, Gebreselassie W, MD  ibuprofen  (ADVIL ) 800 MG tablet Take 1 tablet (800 mg total) by mouth 3 (three) times daily. Take with food 10/20/23   Triplett, Tammy, PA-C  lisinopril -hydrochlorothiazide  (ZESTORETIC ) 20-25 MG tablet Take 1 tablet by mouth daily. 12/16/23   Geofm Delon BRAVO, NP  metFORMIN  (GLUCOPHAGE ) 1000 MG tablet Take 1 tablet (1,000 mg total) by mouth 2 (two) times daily with a meal. 03/23/24   Bacchus, Meade PEDLAR, FNP  nicotine  (NICODERM CQ  - DOSED IN MG/24 HOURS) 21 mg/24hr patch Place 1 patch (21 mg total) onto the skin daily. 11/15/23   Bacchus, Gloria Z, FNP  pantoprazole  (PROTONIX ) 40 MG tablet Take 1 tablet by mouth once daily 04/13/24   Bacchus, Gloria Z, FNP  Semaglutide , 2 MG/DOSE, 8 MG/3ML SOPN Inject 2 mg as directed once a week. 03/20/24   Bacchus, Gloria Z, FNP  senna (SENOKOT) 8.6 MG TABS tablet Take 1 tablet (8.6 mg total) by mouth daily. 06/04/23  Davonna Siad, MD  simvastatin  (ZOCOR ) 40 MG tablet TAKE 1 TABLET BY MOUTH ONCE DAILY IN THE EVENING 04/14/24   Bacchus, Gloria Z, FNP  terbinafine  (LAMISIL ) 1 % cream Apply 1 Application topically 2 (two) times daily. Apply to clean dry skin between your toes 01/12/24   Triplett, Tammy, PA-C  Vitamin D , Ergocalciferol , (DRISDOL ) 1.25 MG (50000 UNIT) CAPS capsule Take 1 capsule (50,000 Units total) by mouth every 7 (seven) days. 03/23/24   Bacchus, Meade PEDLAR, FNP    Family History Family History  Problem Relation Age of Onset   Hypertension Mother     Social History Social History   Tobacco Use   Smoking status: Former    Current packs/day: 1.00    Average packs/day: 1 pack/day for 29.9 years (29.9 ttl pk-yrs)    Types: Cigarettes    Start date: 05/11/1994   Smokeless tobacco: Never  Vaping Use   Vaping status: Every Day   Substances: Nicotine ,  Flavoring  Substance Use Topics   Alcohol use: Not Currently   Drug use: Not Currently    Types: Marijuana     Allergies   Patient has no known allergies.   Review of Systems Review of Systems  Constitutional:  Negative for appetite change, chills and fever.  HENT:  Positive for ear pain and hearing loss. Negative for congestion, rhinorrhea, sinus pressure, sinus pain and sore throat.   Eyes:  Negative for redness and visual disturbance.  Respiratory:  Negative for cough, chest tightness, shortness of breath and wheezing.   Cardiovascular:  Negative for chest pain and palpitations.  Gastrointestinal:  Negative for abdominal pain, constipation, diarrhea, nausea and vomiting.  Genitourinary:  Negative for dysuria, frequency and urgency.  Musculoskeletal:  Negative for myalgias.  Neurological:  Negative for dizziness, weakness and headaches.  Psychiatric/Behavioral:  Negative for confusion.   All other systems reviewed and are negative.    Physical Exam Triage Vital Signs ED Triage Vitals  Encounter Vitals Group     BP      Girls Systolic BP Percentile      Girls Diastolic BP Percentile      Boys Systolic BP Percentile      Boys Diastolic BP Percentile      Pulse      Resp      Temp      Temp src      SpO2      Weight      Height      Head Circumference      Peak Flow      Pain Score      Pain Loc      Pain Education      Exclude from Growth Chart    No data found.  Updated Vital Signs BP 126/84 (BP Location: Right Arm)   Pulse (!) 105   Temp 98.1 F (36.7 C) (Oral)   Resp 20   SpO2 95%   Visual Acuity Right Eye Distance:   Left Eye Distance:   Bilateral Distance:    Right Eye Near:   Left Eye Near:    Bilateral Near:     Physical Exam Vitals reviewed.  Constitutional:      General: He is not in acute distress.    Appearance: Normal appearance. He is not ill-appearing.  HENT:     Head: Normocephalic and atraumatic.     Right Ear: No swelling  or tenderness. There is no impacted cerumen. A PE tube is present. Tympanic  membrane is injected, scarred and erythematous. Tympanic membrane is not retracted or bulging.     Left Ear: Decreased hearing noted. Swelling present. No tenderness. There is no impacted cerumen. A PE tube is present. Tympanic membrane is injected, scarred and erythematous. Tympanic membrane is not retracted or bulging.     Ears:     Comments: Left external auditory canal is lined with exudate. Pulmonary:     Effort: Pulmonary effort is normal.  Neurological:     General: No focal deficit present.     Mental Status: He is alert and oriented to person, place, and time.  Psychiatric:        Mood and Affect: Mood normal.        Behavior: Behavior normal.        Thought Content: Thought content normal.        Judgment: Judgment normal.      UC Treatments / Results  Labs (all labs ordered are listed, but only abnormal results are displayed) Labs Reviewed - No data to display  EKG   Radiology No results found.  Procedures Procedures (including critical care time)  Medications Ordered in UC Medications - No data to display  Initial Impression / Assessment and Plan / UC Course  I have reviewed the triage vital signs and the nursing notes.  Pertinent labs & imaging results that were available during my care of the patient were reviewed by me and considered in my medical decision making (see chart for details).     Patient is a pleasant 48 year old male presenting with recurrent bilateral otitis media.  There is also a left otitis externa.  He is afebrile, but mildly tachycardic at 105.  Antipyretic has not been administered today.  He recently completed course of doxycycline ?  Today, we will treat with Omnicef  x 10 days, and ofloxacin  x 10 days.  Clean dry ear precautions.  I sent a new referral to Dr. Karis, and encouraged him to call their office to schedule this.  Final Clinical Impressions(s) / UC  Diagnoses   Final diagnoses:  Recurrent acute suppurative otitis media without spontaneous rupture of tympanic membrane of both sides     Discharge Instructions      -I sent a referral to Dr. Rojean office. If you don't hear from them by next Thursday, call his office to schedule this follow-up. -Start the antibiotic: Omnicef , twice daily x10 days. You can take with food like with breakfast and dinner. -Ofloxacin  ear drops, twice daily in both ears x10 days -Keep your ears dry until you complete treatment. Use an earplug in the shower.     ED Prescriptions     Medication Sig Dispense Auth. Provider   cefdinir  (OMNICEF ) 300 MG capsule Take 1 capsule (300 mg total) by mouth 2 (two) times daily for 10 days. 20 capsule Zannah Melucci E, PA-C   ofloxacin  (FLOXIN ) 0.3 % OTIC solution Place 3 drops into both ears 2 (two) times daily for 10 days. 5 mL Arlyss Leita BRAVO, PA-C      PDMP not reviewed this encounter.   Arlyss Leita BRAVO, PA-C 04/14/24 1427

## 2024-04-14 NOTE — Discharge Instructions (Addendum)
-  I sent a referral to Dr. Rojean office. If you don't hear from them by next Thursday, call his office to schedule this follow-up. -Start the antibiotic: Omnicef , twice daily x10 days. You can take with food like with breakfast and dinner. -Ofloxacin  ear drops, twice daily in both ears x10 days -Keep your ears dry until you complete treatment. Use an earplug in the shower.

## 2024-04-26 ENCOUNTER — Ambulatory Visit: Admitting: "Endocrinology

## 2024-04-28 ENCOUNTER — Inpatient Hospital Stay: Payer: PRIVATE HEALTH INSURANCE | Admitting: Oncology

## 2024-05-08 ENCOUNTER — Encounter: Payer: Self-pay | Admitting: *Deleted

## 2024-05-09 ENCOUNTER — Other Ambulatory Visit: Payer: Self-pay | Admitting: Oncology

## 2024-05-09 ENCOUNTER — Other Ambulatory Visit: Payer: Self-pay | Admitting: Family Medicine

## 2024-05-09 DIAGNOSIS — I10 Essential (primary) hypertension: Secondary | ICD-10-CM

## 2024-05-09 DIAGNOSIS — K219 Gastro-esophageal reflux disease without esophagitis: Secondary | ICD-10-CM

## 2024-05-12 ENCOUNTER — Ambulatory Visit: Admitting: "Endocrinology

## 2024-05-12 ENCOUNTER — Other Ambulatory Visit: Payer: Self-pay | Admitting: Family Medicine

## 2024-05-12 ENCOUNTER — Other Ambulatory Visit: Payer: Self-pay

## 2024-05-12 DIAGNOSIS — I10 Essential (primary) hypertension: Secondary | ICD-10-CM

## 2024-05-12 MED ORDER — LISINOPRIL-HYDROCHLOROTHIAZIDE 20-25 MG PO TABS: 1.0000 | ORAL_TABLET | Freq: Every day | ORAL | 1 refills | Status: AC

## 2024-05-12 NOTE — Telephone Encounter (Signed)
 Copied from CRM [id number]. Topic: Clinical - Medication Refill >> May 12, 2024 11:15 AM Delon HERO wrote: Medication: lisinopril -hydrochlorothiazide  (ZESTORETIC ) 20-25 MG tablet [[id number]]  Has the patient contacted their pharmacy? Yes (Agent: If no, request that the patient contact the pharmacy for the refill. If patient does not wish to contact the pharmacy document the reason why and proceed with request.) (Agent: If yes, when and what did the pharmacy advise?)  This is the patient's preferred pharmacy:  [facility] [address], [place] - [address] [place] [address] Phone: [phone] Fax: [phone]   Is this the correct pharmacy for this prescription? Yes If no, delete pharmacy and type the correct one.   Has the prescription been filled recently? Yes  Is the patient out of the medication? Yes took last pill today.  Has the patient been seen for an appointment in the last year OR does the patient have an upcoming appointment? Yes  Can we respond through MyChart? Yes  Agent: Please be advised that Rx refills may take up to 3 business days. We ask that you follow-up with your pharmacy.

## 2024-06-07 ENCOUNTER — Ambulatory Visit (INDEPENDENT_AMBULATORY_CARE_PROVIDER_SITE_OTHER): Payer: PRIVATE HEALTH INSURANCE | Admitting: Otolaryngology

## 2024-06-08 ENCOUNTER — Encounter (INDEPENDENT_AMBULATORY_CARE_PROVIDER_SITE_OTHER): Payer: Self-pay | Admitting: Otolaryngology

## 2024-06-08 ENCOUNTER — Ambulatory Visit (INDEPENDENT_AMBULATORY_CARE_PROVIDER_SITE_OTHER): Admitting: Otolaryngology

## 2024-06-08 VITALS — HR 100 | Ht 71.0 in | Wt 239.0 lb

## 2024-06-08 DIAGNOSIS — H6123 Impacted cerumen, bilateral: Secondary | ICD-10-CM | POA: Diagnosis not present

## 2024-06-08 DIAGNOSIS — Z8669 Personal history of other diseases of the nervous system and sense organs: Secondary | ICD-10-CM

## 2024-06-08 DIAGNOSIS — H6983 Other specified disorders of Eustachian tube, bilateral: Secondary | ICD-10-CM | POA: Insufficient documentation

## 2024-06-08 DIAGNOSIS — Z9629 Presence of other otological and audiological implants: Secondary | ICD-10-CM

## 2024-06-08 DIAGNOSIS — Z09 Encounter for follow-up examination after completed treatment for conditions other than malignant neoplasm: Secondary | ICD-10-CM

## 2024-06-08 DIAGNOSIS — H6523 Chronic serous otitis media, bilateral: Secondary | ICD-10-CM | POA: Diagnosis not present

## 2024-06-08 DIAGNOSIS — H900 Conductive hearing loss, bilateral: Secondary | ICD-10-CM

## 2024-06-08 MED ORDER — CIPROFLOXACIN-DEXAMETHASONE 0.3-0.1 % OT SUSP: 4.0000 [drp] | Freq: Two times a day (BID) | OTIC | 8 refills | Status: AC

## 2024-06-08 NOTE — Progress Notes (Signed)
 Patient ID: Raymond Malone, male   DOB: 1976-05-01, 49 y.o.   MRN: [id number]  Follow up: Bilateral eustachian tube dysfunction  History of Present Illness Raymond Malone is a 49 year old male with history of eustachian tube dysfunction and prior bilateral tympanostomy tube placement who presents with recurrent bilateral otorrhea and otitis media.  He underwent bilateral tympanostomy tube placement in 2022 for chronic otitis media with effusion. He remained asymptomatic until 3-4 months ago, when he began experiencing recurrent infections and otorrhea from both ears.  He reports that the right ear is bothering him more, with increased drainage and decreased hearing on that side. He has attempted to keep water  out of his ears. He no longer has access to previously prescribed antibiotic ear drops.  His last otolaryngology follow-up was two years ago, at which time he had a right ear infection treated with topical antibiotics. 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  Exam: General: Communicates without difficulty, well nourished, no acute distress. Head: Normocephalic, no evidence injury, no tenderness, facial buttresses intact without stepoff. Face/sinus: No tenderness to palpation and percussion. Facial movement is normal and symmetric. Eyes: PERRL, EOMI. No scleral icterus, conjunctivae clear. Neuro: CN II exam reveals vision grossly intact.  No nystagmus at any point of gaze. Ears: Auricles well formed without lesions.  Bilateral cerumen impaction.  Nose: External evaluation reveals normal support and skin without lesions.  Dorsum is intact.  Anterior rhinoscopy reveals congested mucosa over anterior aspect of inferior turbinates and intact septum.  No purulence noted. Oral:  Oral cavity and oropharynx are intact, symmetric, without erythema or edema.  Mucosa is moist without lesions. Neck: Full range of motion without pain.  There is no significant  lymphadenopathy.  No masses palpable.  Thyroid  bed within normal limits to palpation.  Parotid glands and submandibular glands equal bilaterally without mass.  Trachea is midline. Neuro:  CN 2-12 grossly intact.   Procedure: Bilateral cerumen disimpaction Anesthesia: None Description: Under the operating microscope, the cerumen is carefully removed with a combination of cerumen currette, alligator forceps, and suction catheters.  The right tube has extruded and is removed.  The left tube is partially extruded.  Middle ear effusion is noted bilaterally.  After the cerumen is removed, the TMs are noted to be normal.  No mass, erythema, or lesions. The patient tolerated the procedure well.   Assessment & Plan Chronic otitis media with effusion, bilateral Recurrent bilateral chronic otitis media with effusion, previously managed with tympanostomy tubes placed four years ago. Over the past 3-4 months, he has experienced recurrent infections and otorrhea. The right tympanostomy tube has extruded with persistent middle ear effusion, and the left tube is partially extruded.  Likely bilateral conductive hearing loss, secondary to the middle ear effusion. - The physical exam findings are reviewed with the patient. -The treatment options are discussed.  Options include conservative observation versus bilateral revision myringotomy and tube placement.  The risk, benefits, and details of the procedure are extensively discussed.  Questions are invited and answered. -The patient would like to proceed with the procedure. - Scheduled bilateral tympanostomy tube placement in the operating room. - Prescribed Ciprodex  otic drops to be used only if otorrhea occurs.  Cerumen impaction, bilateral Significant bilateral cerumen impaction obscured visualization of the tympanic membranes and contributed to symptoms. - Otomicroscopy with bilateral cerumen disimpaction.

## 2024-06-26 ENCOUNTER — Encounter (HOSPITAL_BASED_OUTPATIENT_CLINIC_OR_DEPARTMENT_OTHER): Admission: RE | Payer: Self-pay | Source: Home / Self Care

## 2024-06-26 ENCOUNTER — Ambulatory Visit (HOSPITAL_BASED_OUTPATIENT_CLINIC_OR_DEPARTMENT_OTHER): Admit: 2024-06-26 | Payer: PRIVATE HEALTH INSURANCE | Admitting: Otolaryngology

## 2024-06-28 ENCOUNTER — Ambulatory Visit: Admitting: Urology

## 2024-06-30 ENCOUNTER — Inpatient Hospital Stay: Payer: PRIVATE HEALTH INSURANCE | Admitting: Oncology

## 2024-07-18 ENCOUNTER — Ambulatory Visit: Admitting: Family Medicine

## 2024-07-24 ENCOUNTER — Ambulatory Visit (INDEPENDENT_AMBULATORY_CARE_PROVIDER_SITE_OTHER): Payer: PRIVATE HEALTH INSURANCE | Admitting: Otolaryngology
